# Patient Record
Sex: Male | Born: 1940
Health system: Southern US, Community
[De-identification: ages and names within clinical notes are randomized; demographics above are authoritative.]

## PROBLEM LIST (undated history)

## (undated) DIAGNOSIS — M8580 Other specified disorders of bone density and structure, unspecified site: Secondary | ICD-10-CM

## (undated) DIAGNOSIS — S72009A Fracture of unspecified part of neck of unspecified femur, initial encounter for closed fracture: Secondary | ICD-10-CM

## (undated) DIAGNOSIS — G252 Other specified forms of tremor: Secondary | ICD-10-CM

## (undated) DIAGNOSIS — E291 Testicular hypofunction: Secondary | ICD-10-CM

## (undated) DIAGNOSIS — R079 Chest pain, unspecified: Secondary | ICD-10-CM

## (undated) DIAGNOSIS — K219 Gastro-esophageal reflux disease without esophagitis: Secondary | ICD-10-CM

## (undated) DIAGNOSIS — G250 Essential tremor: Secondary | ICD-10-CM

## (undated) DIAGNOSIS — I10 Essential (primary) hypertension: Secondary | ICD-10-CM

## (undated) DIAGNOSIS — Z8546 Personal history of malignant neoplasm of prostate: Secondary | ICD-10-CM

## (undated) DIAGNOSIS — J309 Allergic rhinitis, unspecified: Secondary | ICD-10-CM

## (undated) DIAGNOSIS — J449 Chronic obstructive pulmonary disease, unspecified: Secondary | ICD-10-CM

## (undated) HISTORY — DX: Testicular hypofunction: E29.1

## (undated) HISTORY — DX: Chest pain, unspecified: R07.9

## (undated) HISTORY — DX: Essential tremor: G25.0

## (undated) HISTORY — DX: Fracture of unspecified part of neck of unspecified femur, initial encounter for closed fracture: S72.009A

## (undated) HISTORY — DX: Other specified disorders of bone density and structure, unspecified site: M85.80

## (undated) HISTORY — DX: Gastro-esophageal reflux disease without esophagitis: K21.9

## (undated) HISTORY — DX: Other specified forms of tremor: G25.2

## (undated) HISTORY — DX: Allergic rhinitis, unspecified: J30.9

## (undated) HISTORY — DX: Personal history of malignant neoplasm of prostate: Z85.46

## (undated) HISTORY — PX: INSERTION PROSTATE RADIATION SEED: SUR718

## (undated) HISTORY — DX: Essential (primary) hypertension: I10

## (undated) HISTORY — DX: Chronic obstructive pulmonary disease, unspecified: J44.9

## (undated) SURGERY — Surgical Case
Anesthesia: *Unknown

---

## 2002-01-19 ENCOUNTER — Ambulatory Visit: Admission: RE | Admit: 2002-01-19 | Discharge: 2002-04-19 | Payer: Self-pay | Admitting: Radiation Oncology

## 2002-11-18 ENCOUNTER — Ambulatory Visit: Admission: RE | Admit: 2002-11-18 | Discharge: 2003-02-16 | Payer: Self-pay | Admitting: Radiation Oncology

## 2003-01-11 ENCOUNTER — Encounter: Admission: RE | Admit: 2003-01-11 | Discharge: 2003-01-11 | Payer: Self-pay | Admitting: Urology

## 2003-01-11 ENCOUNTER — Encounter: Payer: Self-pay | Admitting: Urology

## 2003-01-18 ENCOUNTER — Ambulatory Visit (HOSPITAL_BASED_OUTPATIENT_CLINIC_OR_DEPARTMENT_OTHER): Admission: RE | Admit: 2003-01-18 | Discharge: 2003-01-18 | Payer: Self-pay | Admitting: Urology

## 2003-02-26 ENCOUNTER — Ambulatory Visit: Admission: RE | Admit: 2003-02-26 | Discharge: 2003-03-10 | Payer: Self-pay | Admitting: Radiation Oncology

## 2003-12-19 ENCOUNTER — Ambulatory Visit: Admission: RE | Admit: 2003-12-19 | Discharge: 2003-12-19 | Payer: Self-pay | Admitting: Radiation Oncology

## 2003-12-26 ENCOUNTER — Ambulatory Visit: Admission: RE | Admit: 2003-12-26 | Discharge: 2003-12-26 | Payer: Self-pay | Admitting: Radiation Oncology

## 2005-01-16 ENCOUNTER — Ambulatory Visit: Payer: Self-pay | Admitting: Family Medicine

## 2005-01-28 ENCOUNTER — Encounter: Admission: RE | Admit: 2005-01-28 | Discharge: 2005-01-28 | Payer: Self-pay | Admitting: Family Medicine

## 2005-01-28 ENCOUNTER — Ambulatory Visit: Payer: Self-pay | Admitting: Gastroenterology

## 2005-02-11 ENCOUNTER — Ambulatory Visit: Payer: Self-pay | Admitting: Gastroenterology

## 2005-02-11 ENCOUNTER — Ambulatory Visit (HOSPITAL_COMMUNITY): Admission: RE | Admit: 2005-02-11 | Discharge: 2005-02-11 | Payer: Self-pay | Admitting: Gastroenterology

## 2005-02-28 ENCOUNTER — Ambulatory Visit: Payer: Self-pay | Admitting: Family Medicine

## 2005-03-18 ENCOUNTER — Ambulatory Visit: Payer: Self-pay | Admitting: Family Medicine

## 2005-04-17 ENCOUNTER — Ambulatory Visit: Payer: Self-pay | Admitting: Internal Medicine

## 2005-08-23 ENCOUNTER — Ambulatory Visit: Payer: Self-pay | Admitting: Family Medicine

## 2006-01-20 ENCOUNTER — Ambulatory Visit: Payer: Self-pay | Admitting: Internal Medicine

## 2006-01-23 ENCOUNTER — Ambulatory Visit: Payer: Self-pay | Admitting: Family Medicine

## 2006-02-25 ENCOUNTER — Ambulatory Visit: Payer: Self-pay | Admitting: Internal Medicine

## 2006-04-30 ENCOUNTER — Ambulatory Visit: Payer: Self-pay | Admitting: Internal Medicine

## 2006-09-15 ENCOUNTER — Ambulatory Visit: Payer: Self-pay | Admitting: Internal Medicine

## 2006-11-04 HISTORY — PX: CORONARY ANGIOPLASTY WITH STENT PLACEMENT: SHX49

## 2007-01-22 ENCOUNTER — Ambulatory Visit: Payer: Self-pay | Admitting: Internal Medicine

## 2007-01-22 ENCOUNTER — Encounter: Payer: Self-pay | Admitting: Internal Medicine

## 2007-01-22 DIAGNOSIS — Z8546 Personal history of malignant neoplasm of prostate: Secondary | ICD-10-CM | POA: Insufficient documentation

## 2007-01-22 DIAGNOSIS — K219 Gastro-esophageal reflux disease without esophagitis: Secondary | ICD-10-CM | POA: Insufficient documentation

## 2007-01-22 DIAGNOSIS — R079 Chest pain, unspecified: Secondary | ICD-10-CM | POA: Insufficient documentation

## 2007-01-22 DIAGNOSIS — M899 Disorder of bone, unspecified: Secondary | ICD-10-CM

## 2007-01-22 DIAGNOSIS — M949 Disorder of cartilage, unspecified: Secondary | ICD-10-CM

## 2007-01-22 DIAGNOSIS — I10 Essential (primary) hypertension: Secondary | ICD-10-CM | POA: Insufficient documentation

## 2007-01-22 DIAGNOSIS — S72009A Fracture of unspecified part of neck of unspecified femur, initial encounter for closed fracture: Secondary | ICD-10-CM

## 2007-01-22 DIAGNOSIS — J309 Allergic rhinitis, unspecified: Secondary | ICD-10-CM | POA: Insufficient documentation

## 2007-01-22 HISTORY — DX: Gastro-esophageal reflux disease without esophagitis: K21.9

## 2007-01-22 HISTORY — DX: Allergic rhinitis, unspecified: J30.9

## 2007-01-22 HISTORY — DX: Fracture of unspecified part of neck of unspecified femur, initial encounter for closed fracture: S72.009A

## 2007-01-22 HISTORY — DX: Disorder of bone, unspecified: M89.9

## 2007-01-22 LAB — CONVERTED CEMR LAB
BUN: 17 mg/dL (ref 6–23)
Basophils Absolute: 0.1 10*3/uL (ref 0.0–0.1)
Basophils Relative: 1.6 % — ABNORMAL HIGH (ref 0.0–1.0)
CO2: 30 meq/L (ref 19–32)
Calcium: 9.3 mg/dL (ref 8.4–10.5)
Chloride: 100 meq/L (ref 96–112)
Cholesterol: 214 mg/dL (ref 0–200)
Creatinine, Ser: 0.9 mg/dL (ref 0.4–1.5)
Direct LDL: 117.9 mg/dL
Eosinophils Absolute: 0.3 10*3/uL (ref 0.0–0.6)
Eosinophils Relative: 7.7 % — ABNORMAL HIGH (ref 0.0–5.0)
GFR calc Af Amer: 109 mL/min
GFR calc non Af Amer: 90 mL/min
Glucose, Bld: 87 mg/dL (ref 70–99)
HCT: 41.4 % (ref 39.0–52.0)
HDL: 85.7 mg/dL (ref >39.0)
Hemoglobin: 14.6 g/dL (ref 13.0–17.0)
Lymphocytes Relative: 35.6 % (ref 12.0–46.0)
MCHC: 35.3 g/dL (ref 30.0–36.0)
MCV: 99.7 fL (ref 78.0–100.0)
Monocytes Absolute: 0.3 10*3/uL (ref 0.2–0.7)
Monocytes Relative: 8.2 % (ref 3.0–11.0)
Neutro Abs: 1.5 10*3/uL (ref 1.4–7.7)
Neutrophils Relative %: 46.9 % (ref 43.0–77.0)
Platelets: 191 10*3/uL (ref 150–400)
Potassium: 4.4 meq/L (ref 3.5–5.1)
RBC: 4.15 M/uL — ABNORMAL LOW (ref 4.22–5.81)
RDW: 12.4 % (ref 11.5–14.6)
Sodium: 139 meq/L (ref 135–145)
TSH: 1.2 microintl units/mL (ref 0.35–5.50)
Testosterone: 273.55 ng/dL — ABNORMAL LOW (ref 350.00–890)
Total CHOL/HDL Ratio: 2.5
Triglycerides: 97 mg/dL (ref 0–149)
VLDL: 19 mg/dL (ref 0–40)
WBC: 3.4 10*3/uL — ABNORMAL LOW (ref 4.5–10.5)

## 2007-02-18 ENCOUNTER — Ambulatory Visit: Payer: Self-pay | Admitting: Internal Medicine

## 2007-02-18 ENCOUNTER — Encounter: Payer: Self-pay | Admitting: Internal Medicine

## 2007-02-18 DIAGNOSIS — E291 Testicular hypofunction: Secondary | ICD-10-CM

## 2007-02-18 DIAGNOSIS — G250 Essential tremor: Secondary | ICD-10-CM

## 2007-02-18 DIAGNOSIS — G252 Other specified forms of tremor: Secondary | ICD-10-CM | POA: Insufficient documentation

## 2007-02-18 HISTORY — DX: Testicular hypofunction: E29.1

## 2007-02-18 HISTORY — DX: Essential tremor: G25.0

## 2007-02-20 ENCOUNTER — Ambulatory Visit: Payer: Self-pay | Admitting: Gastroenterology

## 2007-02-20 ENCOUNTER — Ambulatory Visit: Payer: Self-pay

## 2007-02-27 ENCOUNTER — Ambulatory Visit: Payer: Self-pay | Admitting: Gastroenterology

## 2007-02-27 ENCOUNTER — Encounter (INDEPENDENT_AMBULATORY_CARE_PROVIDER_SITE_OTHER): Payer: Self-pay | Admitting: Specialist

## 2007-09-17 ENCOUNTER — Encounter (INDEPENDENT_AMBULATORY_CARE_PROVIDER_SITE_OTHER): Payer: Self-pay | Admitting: *Deleted

## 2007-11-09 ENCOUNTER — Telehealth (INDEPENDENT_AMBULATORY_CARE_PROVIDER_SITE_OTHER): Payer: Self-pay | Admitting: *Deleted

## 2008-02-15 ENCOUNTER — Encounter (INDEPENDENT_AMBULATORY_CARE_PROVIDER_SITE_OTHER): Payer: Self-pay | Admitting: *Deleted

## 2010-09-07 ENCOUNTER — Encounter: Payer: Self-pay | Admitting: Internal Medicine

## 2010-10-18 ENCOUNTER — Ambulatory Visit: Payer: Self-pay | Admitting: Internal Medicine

## 2010-10-18 DIAGNOSIS — J45991 Cough variant asthma: Secondary | ICD-10-CM

## 2010-10-18 HISTORY — DX: Cough variant asthma: J45.991

## 2010-12-04 NOTE — Assessment & Plan Note (Signed)
Summary: yearly checkup   Vital Signs:  Patient Profile:   70 Years Old Male Height:     71 inches Weight:      179 pounds Pulse rate:   60 / minute BP sitting:   150 / 90               History of Present Illness:  here for a yearly checkup. he has noticed  that his blood pressure has been elevated for the last several months to a level ofaround 140 to 150 on the systolic to 90 on the diastolic  ?  Rash in the neck  Some days he has noticed his hands to be shaky.  He has not noticed any relationship of this symptom with anxiety or caffeine intake    Past Medical History:    Hypertension    Risk Factors:  Tobacco use:  quit    Year quit:  1990s Alcohol use:  yes    Comments:   very seldom   Review of Systems       the patient exercises daily. He follows a low-salt low-salt diet  CV      admits to occasional chest pain when he takes his daily walks.  This symptom is not consistent and if he continue to walk the pain goes away.  The pain is also treat her by tomato products intake. denies dyspnea on exertion  GI      Denies bloody stools, diarrhea, nausea, and vomiting.      denies dysphasia and odynophagia  GU      Denies dysuria, incontinence, and urinary frequency.  Psych      Denies anxiety and depression.   Physical Exam  General:     alert, well-developed, and well-nourished.   Neck:     no rash no thyromegaly Lungs:     normal respiratory effort, no intercostal retractions, no accessory muscle use, and normal breath sounds.   Heart:     normal rate, regular rhythm, and no murmur.   Abdomen:     soft, non-tender, normal bowel sounds, no distention, no masses, no guarding, no rigidity, no hepatomegaly, and no splenomegaly.   Pulses:     normal carotid pulse bilaterally Psych:     Oriented X3, normally interactive, good eye contact, not anxious appearing, and not depressed appearing.      Impression & Recommendations:  Problem # 1:   Preventive Health Care (ICD-V70.0) chart reviewed willschedule to have a colonoscopy medication list reviewed check a fasting lipid profile  Problem # 2:  HYPERTENSION (ICD-401.9) increase Monopril to 40 mg continue HCTZ  Problem # 3:  ALLERGIC RHINITIS (ICD-477.9) symptoms are currently well-controlled on Nasonex and Claritin p.r.n.  however he also uses Sudafed daily strongly advised to discontinue the Sudafed   Problem # 4:  OSTEOPENIA (ICD-733.90) due for a bone density test in few months continue calcium and vitamin D check a testosterone level vitamin D Level and a TSH consider further workup  Problem # 5:  GERD (ICD-530.81) symptoms are well controlled continue daily Prilosec chest pain could be related to GERD  Problem # 6:  CHEST PAIN (ICD-786.50) the patient has risk factors such as hypertension and former tobacco use. Pain is atypical. EKG is at baseline I recommend a stress test.  Problem # 7:  tremors on exam today I noticed no tremor asked patient to notice relationship between tremors and caffeine intake reassessing few months  Problem # 8:  follow up  3 months

## 2010-12-04 NOTE — Letter (Signed)
Summary: Primary Care Appointment Letter  [facility] [place]  [address] [place], [place] [address]   Phone: [phone]  Fax: [phone]    02/15/2008 MRN: [id number]  Ichael Lundquist [address] [place], [place]  [address]  Dear Mr. Dyk,   Your Primary Care Physician  has indicated that:    ___X____it is time to schedule an appointment.    _______you missed your appointment on______ and need to call and          reschedule.    _______you need to have lab work done.    _______you need to schedule an appointment discuss lab or test results.    _______you need to call to reschedule your appointment that is                       scheduled on _________.     Please call our office as soon as possible. Our phone number is [phone]          _________. Please press option 1. Our office is open 8a-12noon and 1p-5p, Monday through Friday.     Thank you,     Primary Care Scheduler

## 2010-12-04 NOTE — Assessment & Plan Note (Signed)
   Vital Signs:  Patient Profile:   70 Years Old Male Height:     71 inches Weight:      185.8 pounds Pulse rate:   76 / minute BP sitting:   144 / 96  (left arm)  Vitals Entered By: Doristine Devoid (February 18, 2007 8:22 AM)               Chief Complaint:  high bp and shakey x6 months.  History of Present Illness: the patient in here today because he noticed his blood pressure to be slightly elevated despite the fact that we increase his Monopril recently. Blood pressure ranged from 140 to 160 systolic. He also has noticed some tremor in his hands and definitely like something done about it.     Family History:    father had Parkinson's    Review of Systems       he reports that he is hit shakes but very little.  CV      Cardiolite order during the last visit is pending  Endo      was started on testosterone lately and is tolerating it well   Physical Exam  General:     alert, well-developed, and well-nourished.   Lungs:     clear Heart:     normal rate, regular rhythm, and no murmur.   Neurologic:     fine tremor is not these adhesive bands. Head show no tremor.    Impression & Recommendations:  Problem # 1:  TREMOR, ESSENTIAL (ICD-333.1) most likely the patient has essential tremor spirits Will start beta blockers as a first step of treatment..  Problem # 2:  HYPERTENSION (ICD-401.9) start propranolol 40 mg b.i.d.Marland Kitchen patient instructed to decrease the dose to half if he become  orthostatic or lightheaded  Problem # 3:  HYPOGONADISM (ICD-257.2) he just is started on hormone replacement. Denies any side effects.  Denies any difficulty urinating or increase on his tremo will recheck labels in 4 weeks.  Problem # 4:  follow-up in 4 weeks, Cardiolite pending   Patient Instructions: 1)  started a new medication called propranolol twice a day. this medicine will help both the shakiness and  blood pressure 2)  If you feel  weak, dizzy when you stand  up, cut  the medication in half or stop it altogether and let me know. 3)  Come back in 4 weeks

## 2010-12-04 NOTE — Letter (Signed)
Summary: Generic Letter  [facility] [place]  [address] [place], [place] [address]   Phone: [phone]  Fax: [phone]    09/17/2007  Brian Dunn [address] [place], [place]  [address]    Dear [name],  You are due for an office visit with Dr. Drue Novel.  Please call our office @ [phone] to schedule an appointment.      Sincerely,   Gypsy Decant at [facility]

## 2010-12-04 NOTE — Progress Notes (Signed)
Summary: paz-flonase 0.05%  Medications Added FLONASE 50 MCG/ACT  SUSP (FLUTICASONE PROPIONATE) 2 SPRAYS EACH NOSTIL QD       Phone Note Refill Request   Refills Requested: Medication #1:  flonase 0.5% spr [facility] [address] [id number]   Initial call taken by: Freddy Jaksch,  November 09, 2007 9:50 AM    New/Updated Medications: FLONASE 50 MCG/ACT  SUSP (FLUTICASONE PROPIONATE) 2 SPRAYS EACH NOSTIL QD   Prescriptions: FLONASE 50 MCG/ACT  SUSP (FLUTICASONE PROPIONATE) 2 SPRAYS EACH NOSTIL QD  #1 x 1   Entered by:   Shary Decamp   Authorized by:   Nolon Rod. Paz MD   Signed by:   Shary Decamp on 11/09/2007   Method used:   Electronically sent to ...       [facility] [address]       [address]       [place], [place]  [address]       Ph: [phone] or [phone]       Fax: [phone]   RxID:   [id number]

## 2010-12-06 NOTE — Assessment & Plan Note (Signed)
Summary: Pulmonary/ new pt eval > try off ace, coreg then pft's      Visit Type:  Initial Consult Copy to:  Dr. Gwendlyn Deutscher Primary Provider/Referring Provider:  Dr. Gwendlyn Deutscher  CC:  Recurrent PNA.  History of Present Illness: 70 yowm quit smokjng in 2000 with no respiratory problems at all   October 18, 2010  1st pulmonary office eval for recurrent "pna" = rattling cough/ fatigue/ nasty mucus and difficulty breathing to point of needing recliner requiring prolonged abx improved but not completely better from July thru  November but on day of ov compltely better x for fatigue and cough when eat and still requires  mucinex assoc with intermittent hb and nasal congestion but no purulent secretions.  Pt denies any significant sore throat, dysphagia, itching, sneezing,   fever, chills, sweats, unintended wt loss, pleuritic or exertional cp, hempoptysis, change in activity tolerance  orthopnea pnd or leg swelling Pt also denies any obvious fluctuation in symptoms with weather or environmental change or other alleviating or aggravating factors.       Current Medications (verified): 1)  Hydrochlorothiazide 25 Mg  Tabs (Hydrochlorothiazide) .... Take One Tablet Daily 2)  Osteo Bi-Flex Adv Joint Shield  Tabs (Misc Natural Products) .Marland Kitchen.. 1 Once Daily 3)  Coenzyme Q10 10 Mg Caps (Coenzyme Q10) .Marland Kitchen.. 1 Once Daily 4)  Potassium 99 Mg Tabs (Potassium) .Marland Kitchen.. 1 Once Daily 5)  Saw Palmetto 450 Mg Caps (Saw Palmetto (Serenoa Repens)) .Marland Kitchen.. 1 Once Daily 6)  Omeprazole 20 Mg Cpdr (Omeprazole) .Marland Kitchen.. 1 Once Daily As Needed 7)  Fish Oil Double Strength 1200 Mg Caps (Omega-3 Fatty Acids) .Marland Kitchen.. 1 Once Daily 8)  Multivitamins  Tabs (Multiple Vitamin) .Marland Kitchen.. 1 Once Daily 9)  Vitamin B-12 500 Mcg Tabs (Cyanocobalamin) .Marland Kitchen.. 1 Once Daily 10)  Aspirin 81 Mg Tbec (Aspirin) .Marland Kitchen.. 1 Once Daily 11)  Tamsulosin Hcl 0.4 Mg Caps (Tamsulosin Hcl) .Marland Kitchen.. 1 Once Daily 12)  Carvedilol 25 Mg Tabs (Carvedilol) .Marland Kitchen.. 1 Once Daily 13)   Fosinopril Sodium 40 Mg Tabs (Fosinopril Sodium) .Marland Kitchen.. 1 Once Daily 14)  Amlodipine Besylate 5 Mg Tabs (Amlodipine Besylate) .Marland Kitchen.. 1 Once Daily 15)  Mucinex Dm 30-600 Mg Xr12h-Tab (Dextromethorphan-Guaifenesin) .Marland Kitchen.. 1 Once Daily 16)  Flovent Hfa 110 Mcg/act Aero (Fluticasone Propionate  Hfa) .Marland Kitchen.. 1 Puff Two Times A Day  Allergies (verified): 1)  ! Pcn 2)  ! Morphine  Past History:  Past Medical History: COPD (ICD-496) HYPOGONADISM (ICD-257.2) TREMOR, ESSENTIAL (ICD-333.1) CHEST PAIN (ICD-786.50) HIP FRACTURE (ICD-820.8) OSTEOPENIA (ICD-733.90) ALLERGIC RHINITIS (ICD-477.9) GERD (ICD-530.81) HX, PERSONAL, MALIGNANCY, PROSTATE (ICD-V10.46) HYPERTENSION (ICD-401.9)      - Try off ace and coreg October 18, 2010   Past Surgical History: Cardiac Stent- 2008 Prostate seed implant 2003  Family History: Parkinson's dz- Father Throat CA- Sister Asthma- Father and Sister Heart dz- Mother and Sister  Social History: Married Children Former smoker.  Quit in 2000.  Smoked for approx 35 yrs- up to 1 ppd. ETOH daily Works in [employer]  Review of Systems       The patient complains of shortness of breath with activity, non-productive cough, acid heartburn, loss of appetite, weight change, nasal congestion/difficulty breathing through nose, and joint stiffness or pain.  The patient denies shortness of breath at rest, productive cough, coughing up blood, chest pain, irregular heartbeats, indigestion, abdominal pain, difficulty swallowing, sore throat, tooth/dental problems, headaches, sneezing, itching, ear ache, anxiety, depression, hand/feet swelling, rash, change in color of mucus, and fever.  Vital Signs:  Patient profile:   70 year old male Weight:      173 pounds BMI:     24.22 O2 Sat:      99 % on Room air Temp:     97.5 degrees F oral Pulse rate:   54 / minute BP sitting:   114 / 60  (left arm)  Vitals Entered By: Vernie Murders (October 18, 2010 11:04 AM)  O2 Flow:  Room  air  Physical Exam  Additional Exam:  amb slt hoarse wm nad wt 173 October 18, 2010 HEENT mild turbinate edema.  Oropharynx no thrush or excess pnd or cobblestoning.  No JVD or cervical adenopathy. Mild accessory muscle hypertrophy. Trachea midline, nl thryroid. Chest was hyperinflated by percussion with diminished breath sounds and moderate increased exp time without wheeze. Hoover sign positive at mid inspiration. Regular rate and rhythm without murmur gallop or rub or increase P2 or edema.  Abd: no hsm, nl excursion. Ext warm without cyanosis or clubbing.     CXR  Procedure date:  09/07/2010  Findings:      Right infrahilar atx seen best on lateral view  Impression & Recommendations:  Problem # 1:  COPD (ICD-496) When respiratory symptoms begin well after a patient reports complete smoking cessation,  it is very hard to "blame" COPD  ie it doesn't make any more sense than hearing a  NASCAR driver wrecked his car while driving his kids to school or a Careers adviser sliced his hand off carving [place].  Once the high risk activity stops,  the symptoms should not suddenly erupt.  If so, the differential diagnosis should include  obesity/deconditioning,  LPR/Reflux, CHF, or side effect of medications, esp coreg and ace inhibitors  Will need full pfts on return off ace and coreg x 1 month  Problem # 2:  HYPERTENSION (ICD-401.9)  The following medications were removed from the medication list:    Metoprolol Tartrate 25 Mg Tabs (Metoprolol tartrate) .Marland Kitchen... 1 by mouth two times a day-due office visit    Carvedilol 25 Mg Tabs (Carvedilol) .Marland Kitchen... 1 once daily    Fosinopril Sodium 40 Mg Tabs (Fosinopril sodium) .Marland Kitchen... 1 once daily    Bystolic 20 Mg Tabs (Nebivolol hcl) ..... One daily His updated medication list for this problem includes:    Hydrochlorothiazide 25 Mg Tabs (Hydrochlorothiazide) .Marland Kitchen... Take one tablet daily    Amlodipine Besylate 5 Mg Tabs (Amlodipine besylate) .Marland Kitchen... 1 once daily     Benicar 40 Mg Tabs (Olmesartan medoxomil) ..... One tablet by mouth daily    Bisoprolol Fumarate 10 Mg Tabs (Bisoprolol fumarate) ..... One daily  Orders: New Patient Level V ([address])  Medications Added to Medication List This Visit: 1)  Osteo Bi-flex Adv Joint Shield Tabs (Misc natural products) .Marland Kitchen.. 1 once daily 2)  Coenzyme Q10 10 Mg Caps (Coenzyme q10) .Marland Kitchen.. 1 once daily 3)  Potassium 99 Mg Tabs (Potassium) .Marland Kitchen.. 1 once daily 4)  Saw Palmetto 450 Mg Caps (Saw palmetto (serenoa repens)) .Marland Kitchen.. 1 once daily 5)  Multivitamins Tabs (Multiple vitamin) .Marland Kitchen.. 1 once daily 6)  Fish Oil Double Strength 1200 Mg Caps (Omega-3 fatty acids) .Marland Kitchen.. 1 once daily 7)  Vitamin B-12 500 Mcg Tabs (Cyanocobalamin) .Marland Kitchen.. 1 once daily 8)  Aspirin 81 Mg Tbec (Aspirin) .Marland Kitchen.. 1 once daily 9)  Tamsulosin Hcl 0.4 Mg Caps (Tamsulosin hcl) .Marland Kitchen.. 1 once daily 10)  Carvedilol 25 Mg Tabs (Carvedilol) .Marland Kitchen.. 1 once daily 11)  Fosinopril Sodium 40 Mg Tabs (  Fosinopril sodium) .Marland Kitchen.. 1 once daily 12)  Amlodipine Besylate 5 Mg Tabs (Amlodipine besylate) .Marland Kitchen.. 1 once daily 13)  Mucinex Dm 30-600 Mg Xr12h-tab (Dextromethorphan-guaifenesin) .Marland Kitchen.. 1 once daily 14)  Flovent Hfa 110 Mcg/act Aero (Fluticasone propionate  hfa) .Marland Kitchen.. 1 puff two times a day 15)  Omeprazole 20 Mg Cpdr (Omeprazole) .Marland Kitchen.. 1 once daily as needed 16)  Benicar 40 Mg Tabs (Olmesartan medoxomil) .... One tablet by mouth daily 17)  Bystolic 20 Mg Tabs (Nebivolol hcl) .... One daily 18)  Bisoprolol Fumarate 10 Mg Tabs (Bisoprolol fumarate) .... One daily  Patient Instructions: 1)  Stop flovent/ crevidol/ fish oil/ finsopril  2)  Benicar 40 mg one daily 3)  bystolic 20 mg one daily  4)  Please schedule a follow-up appointment in 6 weeks, sooner if needed with pft's and cxr  Prescriptions: BISOPROLOL FUMARATE 10 MG TABS (BISOPROLOL FUMARATE) one daily  #30 x 11   Entered by:   Vernie Murders   Authorized by:   Nyoka Cowden MD   Signed by:   Vernie Murders on 10/18/2010    Method used:   Electronically to        [facility]  [id number] (retail)       [address]       [place], [place]  [address]       Ph: [phone] or [phone]       Fax: [phone]   RxID:   [id number] BISOPROLOL FUMARATE 10 MG TABS (BISOPROLOL FUMARATE) one daily  #30 x 11   Entered and Authorized by:   Nyoka Cowden MD   Signed by:   Nyoka Cowden MD on 10/18/2010   Method used:   Electronically to        [facility] [address] (retail)       [address]       [place], [place]  [address]       Ph: [phone] or [phone]       Fax: [phone]   RxID:   [id number]

## 2010-12-07 ENCOUNTER — Ambulatory Visit: Admit: 2010-12-07 | Payer: Self-pay | Admitting: Internal Medicine

## 2010-12-07 ENCOUNTER — Other Ambulatory Visit: Payer: Self-pay | Admitting: Internal Medicine

## 2010-12-07 ENCOUNTER — Ambulatory Visit (INDEPENDENT_AMBULATORY_CARE_PROVIDER_SITE_OTHER): Payer: Medicare Other | Admitting: Internal Medicine

## 2010-12-07 ENCOUNTER — Encounter: Payer: Self-pay | Admitting: Internal Medicine

## 2010-12-07 ENCOUNTER — Ambulatory Visit (INDEPENDENT_AMBULATORY_CARE_PROVIDER_SITE_OTHER)
Admission: RE | Admit: 2010-12-07 | Discharge: 2010-12-07 | Disposition: A | Discharge status: Home / Self Care | Payer: Medicare Other | Source: Ambulatory Visit | Attending: Internal Medicine | Admitting: Internal Medicine

## 2010-12-07 DIAGNOSIS — J449 Chronic obstructive pulmonary disease, unspecified: Secondary | ICD-10-CM

## 2010-12-07 DIAGNOSIS — I10 Essential (primary) hypertension: Secondary | ICD-10-CM

## 2010-12-10 ENCOUNTER — Telehealth (INDEPENDENT_AMBULATORY_CARE_PROVIDER_SITE_OTHER): Payer: Self-pay | Admitting: *Deleted

## 2010-12-20 NOTE — Assessment & Plan Note (Signed)
Summary: Pulmonary/ ext f/u ov for uacs   Vital Signs:  Patient profile:   70 year old male Height:      71 inches Weight:      183.38 pounds BMI:     25.67 O2 Sat:      97 % on Room air Temp:     97.7 degrees F oral Pulse rate:   62 / minute BP sitting:   132 / 74  (left arm) Cuff size:   large  Vitals Entered By: Carver Fila (December 07, 2010 9:35 AM)  O2 Flow:  Room air CC: breathing is better. Pt c/o wheezing, little cough Comments meds and allergies updated Phone number updated Carver Fila  December 07, 2010 9:36 AM    Copy to:  Dr. Gwendlyn Deutscher Primary Provider/Referring Provider:  Dr. Gwendlyn Deutscher  CC:  breathing is better. Pt c/o wheezing and little cough.  History of Present Illness: 31 yowm quit smokjng in 2000 with no respiratory problems at all   October 18, 2010  1st pulmonary office eval for recurrent "pna" = rattling cough/ fatigue/ nasty mucus and difficulty breathing to point of needing recliner requiring prolonged abx improved but not completely better from July thru  November 2011  but on day of ov compltely better x for fatigue and cough when eat and still requires  mucinex assoc with intermittent hb and nasal congestion but no purulent secretions.  rec Stop flovent/ crevidol/ fish oil/ finsopril  Benicar 40 mg one daily bystolic 20 mg one daily   December 07, 2010 ov breathing is better. Pt c/o wheezing, little cough x one week thinks caught a cold but this happened after resumed fisopril because ran out of benicar.  no purulent sputum.  Pt denies any significant sore throat, dysphagia, itching, sneezing,  nasal congestion or excess secretions,  fever, chills, sweats, unintended wt loss, pleuritic or exertional cp, hempoptysis, change in activity tolerance  orthopnea pnd or leg swelling        Current Medications (verified): 1)  Hydrochlorothiazide 25 Mg  Tabs (Hydrochlorothiazide) .... Take One Tablet Daily 2)  Osteo Bi-Flex Adv Joint Shield  Tabs  (Misc Natural Products) .Marland Kitchen.. 1 Once Daily 3)  Coenzyme Q10 10 Mg Caps (Coenzyme Q10) .Marland Kitchen.. 1 Once Daily 4)  Potassium 99 Mg Tabs (Potassium) .Marland Kitchen.. 1 Once Daily 5)  Saw Palmetto 450 Mg Caps (Saw Palmetto (Serenoa Repens)) .Marland Kitchen.. 1 Once Daily 6)  Multivitamins  Tabs (Multiple Vitamin) .Marland Kitchen.. 1 Once Daily 7)  Vitamin B-12 500 Mcg Tabs (Cyanocobalamin) .Marland Kitchen.. 1 Once Daily 8)  Aspirin 81 Mg Tbec (Aspirin) .Marland Kitchen.. 1 Once Daily 9)  Tamsulosin Hcl 0.4 Mg Caps (Tamsulosin Hcl) .Marland Kitchen.. 1 Once Daily 10)  Amlodipine Besylate 5 Mg Tabs (Amlodipine Besylate) .Marland Kitchen.. 1 Once Daily 11)  Mucinex Dm 30-600 Mg Xr12h-Tab (Dextromethorphan-Guaifenesin) .Marland Kitchen.. 1 Once Daily 12)  Omeprazole 20 Mg Cpdr (Omeprazole) .Marland Kitchen.. 1 Once Daily As Needed 13)  Benicar 40 Mg  Tabs (Olmesartan Medoxomil) .... One Tablet By Mouth Daily 14)  Bisoprolol Fumarate 10 Mg Tabs (Bisoprolol Fumarate) .... One Daily  Allergies (verified): 1)  ! Pcn 2)  ! Morphine  Past History:  Past Medical History: COPD (ICD-496) HYPOGONADISM (ICD-257.2) TREMOR, ESSENTIAL (ICD-333.1) CHEST PAIN (ICD-786.50) HIP FRACTURE (ICD-820.8) OSTEOPENIA (ICD-733.90) ALLERGIC RHINITIS (ICD-477.9) GERD (ICD-530.81) HX, PERSONAL, MALIGNANCY, PROSTATE (ICD-V10.46) HYPERTENSION (ICD-401.9)      - Try off ace and coreg October 18, 2010 > much better under resumed ace > try off again  December 07, 2010   Physical Exam  Additional Exam:  amb slt hoarse wm nad/ slt nasal tone to voice wt 173 October 18, 2010  > 183 December 07, 2010  HEENT mild turbinate edema.  Oropharynx no thrush or excess pnd or cobblestoning.  No JVD or cervical adenopathy. Mild accessory muscle hypertrophy. Trachea midline, nl thryroid. Chest was hyperinflated by percussion with diminished breath sounds and moderate increased exp time without wheeze. Hoover sign positive at mid inspiration. Regular rate and rhythm without murmur gallop or rub or increase P2 or edema.  Abd: no hsm, nl excursion. Ext warm  without cyanosis or clubbing.     Impression & Recommendations:  Problem # 1:  COPD (ICD-496) When respiratory symptoms begin well after a patient reports complete smoking cessation,  it is very hard to "blame" COPD  ie it doesn't make any more sense than hearing a  NASC[employer] driver wrecked his car while driving his kids to school or a Careers adviser sliced his hand off carving [place].  Once the high risk activity stops,  the symptoms should not suddenly erupt.  If so, the differential diagnosis should include  obesity/deconditioning,  LPR/Reflux, CHF, or side effect of medications, esp coreg and ace inhibitors  Will need full pfts on return off ace and coreg x 1 month > was clearly better off ace so will rechallenge off ace again Also needs rx for omeprazole for any flare in resp symptoms, not just classic HB  Problem # 2:  HYPERTENSION (ICD-401.9)  His updated medication list for this problem includes:    Hydrochlorothiazide 25 Mg Tabs (Hydrochlorothiazide) .Marland Kitchen... Take one tablet daily    Amlodipine Besylate 5 Mg Tabs (Amlodipine besylate) .Marland Kitchen... 1 once daily    Benicar 40 Mg Tabs (Olmesartan medoxomil) ..... One tablet by mouth daily    Bisoprolol Fumarate 10 Mg Tabs (Bisoprolol fumarate) ..... One daily   ACE inhibitors are problematic in  pts with airway complaints because  even experienced pulmonologists can't always distinguish ace effects from copd/asthma.  By themselves they don't actually cause a problem, much like oxygen can't by itself start a fire, but they certainly serve as a powerful catalyst or enhancer for any "fire"  or inflammatory process in the upper airway, be it caused by an ET  tube or more commonly reflux (especially in the obese or pts with known GERD or who are on biphoshonates).  In the era of ARB near equivalency until we have a better handle on the reversibility of the airway problem, it just makes sense to avoid ace entirely in the short run and then decide later, having  established a level of airway control using a reasonable limited regimen, whether to add back ace but even then being very careful to observe the pt for worsening airway control and number of meds used/ needed to control symptoms.     Easily confused with med instructions     Other Orders: T-2 View CXR (71020TC) Est. Patient Level IV (29528)  Patient Instructions: 1)  Please schedule a follow-up appointment in 4 weeks, sooner if needed with PFT's on return 2)  Omeprazole 20 mg Take  one 30-60 min before first meal of the day as needed for heartburn but also for any respiratory symptom flare (cough, congestion, short of breath) 3)  Stop flovent/ crevidol/ fish oil/ finsopril  4)  Benicar 40 mg one daily 5)  Bisoprolol 10 mg one daily   Orders Added: 1)  T-2 View  CXR [71020TC] 2)  Est. Patient Level IV [[address]]   Immunization History:  Influenza Immunization History:    Influenza:  historical (09/09/2010)   Immunization History:  Influenza Immunization History:    Influenza:  Historical (09/09/2010)    CXR  Procedure date:  12/07/2010  Findings:      Comparison: [facility] [address] 01/28/2005.   Findings: Stable slight chronic wedging inferior dorsal spine without retropulsion visualized.  Lungs remain clear.  Heart size normal.  Mediastinum, hila, pleura and remaining osseous structures are stable and unremarkable.   IMPRESSION: Stable - no active disease.

## 2010-12-20 NOTE — Progress Notes (Signed)
Summary: Rx for omeprazole ok to call in  Phone Note Call from Patient Call back at Home Phone [phone]   Caller: Patient Call For: wert Summary of Call: Returning [name] call. Initial call taken by: Darletta Moll,  December 10, 2010 8:31 AM  Follow-up for Phone Call        called and gave pt cxr results and he verblized understanidng. Pt states he would like an rx for omeprazole bc it would be much cheaper for him. MW, please advise if you are okay if we send rx Brian Dunn  December 10, 2010 8:42 AM  ok omeprazole 20 mg Take  one 30-60 min before first meal of the day  Follow-up by: Nyoka Cowden MD,  December 10, 2010 11:19 AM  Additional Follow-up for Phone Call Additional follow up Details #1::        Rx was sent and pt made aware.   Additional Follow-up by: Vernie Murders,  December 10, 2010 11:27 AM    New/Updated Medications: OMEPRAZOLE 20 MG CPDR (OMEPRAZOLE) take 1 by mouth 30-60 min before first meal of the day Prescriptions: OMEPRAZOLE 20 MG CPDR (OMEPRAZOLE) take 1 by mouth 30-60 min before first meal of the day  #30 x 5   Entered by:   Vernie Murders   Authorized by:   Nyoka Cowden MD   Signed by:   Vernie Murders on 12/10/2010   Method used:   [facility]  [address] [id number]       [address]       [place], [place]  [address]       Ph: [phone] or [phone]       Fax: [phone]   RxID:   [id number]

## 2010-12-25 ENCOUNTER — Telehealth (INDEPENDENT_AMBULATORY_CARE_PROVIDER_SITE_OTHER): Payer: Self-pay | Admitting: *Deleted

## 2011-01-01 NOTE — Progress Notes (Signed)
Summary: Benicar not covered by insurance> change to diovan 320<<<done  Phone Note Outgoing Call   Call placed by: Michel Bickers CMA,  December 25, 2010 10:36 AM Call placed to: [facility] [phone] Summary of Call: PA initiated for Benicar.  Member ID# is [id number].  Covered alternatives are:  Losartan, Micardis and Diovan.  Pls advise if one of the listed alternatives would be appropriate for this patient.  If PA is still needed we will need to call [facility] at [phone]. Initial call taken by: Michel Bickers CMA,  December 25, 2010 10:39 AM  Follow-up for Phone Call        diovan 320 mg one daily will do Follow-up by: Nyoka Cowden MD,  December 25, 2010 4:05 PM  Additional Follow-up for Phone Call Additional follow up Details #1::        Pt is aware and [facility] [address] [facility] [place] per pt request.    New/Updated Medications: DIOVAN 320 MG TABS (VALSARTAN) 1 by mouth daily Prescriptions: DIOVAN 320 MG TABS (VALSARTAN) 1 by mouth daily  #30 x 6   Entered by:   Michel Bickers CMA   Authorized by:   Nyoka Cowden MD   Signed by:   Michel Bickers CMA on 12/26/2010   Method used:   [facility]        [facility]  [address] [id number]       [address]       [place], [place]  [address]       Ph: [phone] or [phone]       Fax: [phone]   RxID:   [id number]

## 2011-01-09 ENCOUNTER — Telehealth (INDEPENDENT_AMBULATORY_CARE_PROVIDER_SITE_OTHER): Payer: Self-pay | Admitting: *Deleted

## 2011-01-10 ENCOUNTER — Ambulatory Visit: Payer: Medicare Other | Admitting: Internal Medicine

## 2011-01-15 NOTE — Progress Notes (Signed)
Summary: Diovan too expensive > try hyzaar 100/25  Phone Note Call from Patient   Caller: Patient Call For: Brian Dunn Summary of Call: pt says he needs a rx that's generic to replace diovan- says this is too expensive- 40.00- so he never picked this up. call celln [phone] Initial call taken by: Tivis Ringer, CNA,  January 09, 2011 10:48 AM  Follow-up for Phone Call        Pt was previously put on Benicar 40mg  but per previous phone note this was changed to diovan 320 for insurance purposes.  Pt states he went to pick up Diovan rx but it was $40.  States this is too expensive and would like to know if there is a cheaper alternative.  He has 8 days of bp med left.  Ok with call back tomorrow when MW returns to office.   Follow-up by: Gweneth Dimitri RN,  January 09, 2011 11:01 AM  Additional Follow-up for Phone Call Additional follow up Details #1::        try hyzaar 100 but will need bp rechecked on this in one week after makes change and may need to be adjusted by [place] or his primary Additional Follow-up by: Nyoka Cowden MD,  January 09, 2011 11:33 AM    Additional Follow-up for Phone Call Additional follow up Details #2::    Dr. Sherene Sires, do you want hyzaar 100/12.5 or 100/25?   Gweneth Dimitri RN  January 09, 2011 11:40 AM make it 100/25 but he'll need to stop the hctz 25 presently taking per mar Follow-up by: Nyoka Cowden MD,  January 09, 2011 2:35 PM  Additional Follow-up for Phone Call Additional follow up Details #3:: Details for Additional Follow-up Action Taken: Rx was sent-need to inform him to stop HCTZ that he is already taking. Vernie Murders  January 09, 2011 2:40 PM  Pt returned call.  He was informed to take hyzaar 100/25 but he will need to stop the HCTZ he is already taking as this is included in the hyzaar.  He verbalized understanding of this.  Pt aware he will need to have bp rechecked in 1 wk after starting new med by either [place] or PCP.  He verbalized understanding and stated he would  like to have this checked at PCP office as it is closer to him.  Advised this is ok but if they can't do it to call our office back.  Pt verbalized understanding of all instructions. Additional Follow-up by: Gweneth Dimitri RN,  January 09, 2011 4:45 PM  New/Updated Medications: HYDROCHLOROTHIAZIDE 25 MG  TABS (HYDROCHLOROTHIAZIDE) take one tablet daily ** PT WILL STOP ONCE HE STARTS HYZAAR HYZAAR 100-25 MG TABS (LOSARTAN POTASSIUM-HCTZ) Take 1 tablet by mouth once a day Prescriptions: HYZAAR 100-25 MG TABS (LOSARTAN POTASSIUM-HCTZ) Take 1 tablet by mouth once a day  #30 x 0   Entered by:   Gweneth Dimitri RN   Authorized by:   Nyoka Cowden MD   Signed by:   Gweneth Dimitri RN on 01/09/2011   Method used:   [facility]        [facility]  [id number]       [address]       [place], [place]  [address]       Ph: [phone] or [phone]       Fax: [phone]   RxID:   [id number]

## 2011-01-17 ENCOUNTER — Telehealth: Payer: Self-pay | Admitting: Internal Medicine

## 2011-01-22 ENCOUNTER — Encounter: Payer: Self-pay | Admitting: Internal Medicine

## 2011-01-22 ENCOUNTER — Other Ambulatory Visit: Payer: Self-pay | Admitting: Internal Medicine

## 2011-01-22 ENCOUNTER — Ambulatory Visit (INDEPENDENT_AMBULATORY_CARE_PROVIDER_SITE_OTHER): Payer: Medicare Other | Admitting: Internal Medicine

## 2011-01-22 VITALS — BP 128/80 | HR 49 | Temp 97.9°F | Wt 178.0 lb

## 2011-01-22 DIAGNOSIS — I10 Essential (primary) hypertension: Secondary | ICD-10-CM

## 2011-01-22 DIAGNOSIS — G252 Other specified forms of tremor: Secondary | ICD-10-CM

## 2011-01-22 DIAGNOSIS — G250 Essential tremor: Secondary | ICD-10-CM

## 2011-01-22 DIAGNOSIS — J449 Chronic obstructive pulmonary disease, unspecified: Secondary | ICD-10-CM

## 2011-01-22 DIAGNOSIS — K219 Gastro-esophageal reflux disease without esophagitis: Secondary | ICD-10-CM

## 2011-01-22 MED ORDER — MOMETASONE FURO-FORMOTEROL FUM 200-5 MCG/ACT IN AERO: 2.0000 | INHALATION_SPRAY | Freq: Two times a day (BID) | RESPIRATORY_TRACT | Status: DC

## 2011-01-22 NOTE — Assessment & Plan Note (Signed)
He has more AB than previously appreciated and is an excellent candidate for symbicort or dulera pending results of pft's  The proper method of use, as well as anticipated side effects, of this metered-dose inhaler are discussed and demonstrated to the patient.     Each maintenance medication was reviewed in detail including most importantly the difference between maintenance and as needed and under what circumstances the prns are to be used.  Please see instructions for details which were reviewed in writing and the patient given a copy.

## 2011-01-22 NOTE — Assessment & Plan Note (Signed)
I think of GERD in setting of unstable copd like I think of oxygen in a fire and therefore rec aggressive diet modification and ppi until the fire is completely out, then regroup.

## 2011-01-22 NOTE — Progress Notes (Signed)
  Subjective:    Patient ID: Brian Dunn, male    DOB: Apr 27, 1941, 70 y.o.   MRN: [id number]  HPI  63 yowm quit smokjng in 2000 with no respiratory problems at all   October 18, 2010  1st pulmonary office eval for recurrent "pna" x 2 years  = rattling cough/ fatigue/ nasty mucus and difficulty breathing to point of needing recliner requiring prolonged abx improved but not completely better from July thru  November 2011  but on day of ov completely better x for fatigue and cough when eat and still requires mucinex assoc with intermittent hb and nasal congestion but no purulent secretions.  rec Stop flovent/ crevidol/ fish oil/ finsopril  Benicar 40 mg one daily bystolic 20 mg one daily   December 07, 2010 ov breathing is better. Pt c/o wheezing, little cough x one week thinks caught a cold but this happened after resumed fisopril because ran out of benicar and changed back to ac   rec stay on ppi and off flovent ,  corvedol and fish and never got better cough then much worse 1st week in March more congestion with yellow mucus steadily worse esp when head hits pillow and after meals.  01/22/2011  Ov much worse cough despite off ace, on bystolic and on ppi.  Esp severe at hs. No purulent sputum or overt hb.     Pt denies any significant sore throat, dysphagia, itching, sneezing,  nasal congestion or excess/ purulent secretions,  fever, chills, sweats, unintended wt loss, pleuritic or exertional cp, hempoptysis, orthopnea pnd or leg swelling.    Also denies any obvious fluctuation of symptoms with weather or environmental changes or other aggravating or alleviating factors.  Past Medical History: COPD (ICD-496)     - HFA 75% p coaching 01/22/2011  HYPOGONADISM (ICD-257.2) TREMOR, ESSENTIAL (ICD-333.1) CHEST PAIN (ICD-786.50) HIP FRACTURE (ICD-820.8) OSTEOPENIA (ICD-733.90) ALLERGIC RHINITIS (ICD-477.9) GERD (ICD-530.81) HX, PERSONAL, MALIGNANCY, PROSTATE (ICD-V10.46) HYPERTENSION  (ICD-401.9)      - Try off ace and coreg October 18, 2010 > much better under resumed ace > try off again December 07, 2010          Review of Systems     Objective:   Physical Exam     amb slt hoarse wm nad/ slt nasal tone to voice wt 173 October 18, 2010  > 183 December 07, 2010  HEENT mild turbinate edema.  Oropharynx no thrush or excess pnd or cobblestoning.  No JVD or cervical adenopathy. Mild accessory muscle hypertrophy. Trachea midline, nl thryroid. Chest was hyperinflated by percussion with diminished breath sounds and moderate increased exp time  With new sonorous exp > insp bilateral wheeze. Hoover sign positive at mid inspiration. Regular rate and rhythm without murmur gallop or rub or increase P2 or edema.  Abd: no hsm, nl excursion. Ext warm without cyanosis or clubbing.   Assessment & Plan:

## 2011-01-22 NOTE — Progress Notes (Signed)
Summary: appt w wert  Phone Note Call from Patient Call back at Home Phone [phone]   Caller: Patient Call For: wert Reason for Call: Talk to Nurse Summary of Call: Patient started new bp med ,hyzarr, and is needing a f/u visit w/ Dr. Sherene Sires.  Asking to speak w/ nurse. Initial call taken by: Lehman Prom,  January 17, 2011 10:49 AM  Follow-up for Phone Call        Spoke with pt and he states over last month he has had increased cough, wheezing, head and chest congestion and is requesting to be seen. Pt set fro appt on 01-22-11 at 10:45 with MW. Carron Curie CMA  January 17, 2011 12:04 PM

## 2011-01-22 NOTE — Assessment & Plan Note (Signed)
Reviewed tremor may increase on dulera so will need to be balanced against benefits to cough

## 2011-01-22 NOTE — Patient Instructions (Signed)
See co-ordinator before leaving for sinus ct Prilosec before breakfast and pepcid 20 mg one at bedtime  Mucinex dm is take 2 every 12 hours if needed for cough or congestion Dulera 200 Take 2 puffs first thing in am and then another 2 puffs about 12 hours later.  Work on inhaler technique:  relax and gently blow all the way out then take a nice smooth deep breath back in, triggering the inhaler at same time you start breathing in.  Hold for up to 5 seconds if you can.  Rinse and gargle with water when done   If your mouth or throat starts to bother you,   I suggest you time the inhaler to your dental care and after using the inhaler(s) brush teeth and tongue with a baking soda containing toothpaste and when you rinse this out, gargle with it first to see if this helps your mouth and throat.     No mint menthol chocalate or oil based vitamins if you can avoid them  Keep your appt  For PFT's and visit

## 2011-01-22 NOTE — Assessment & Plan Note (Signed)
Bystolic, the most beta -1  selective Beta blocker available in sample form, with bisoprolol the most selective generic choice  on the market, are the best choices for beta blockers and definitely want to avoid aces so as not to add a layer of confusion to interpretation of his respiratory symptoms.

## 2011-01-23 ENCOUNTER — Encounter: Payer: Self-pay | Admitting: Internal Medicine

## 2011-01-24 ENCOUNTER — Ambulatory Visit (INDEPENDENT_AMBULATORY_CARE_PROVIDER_SITE_OTHER)
Admission: RE | Admit: 2011-01-24 | Discharge: 2011-01-24 | Disposition: A | Discharge status: Home / Self Care | Payer: Medicare Other | Source: Ambulatory Visit | Attending: Internal Medicine | Admitting: Internal Medicine

## 2011-01-24 DIAGNOSIS — J449 Chronic obstructive pulmonary disease, unspecified: Secondary | ICD-10-CM

## 2011-01-28 ENCOUNTER — Telehealth: Payer: Self-pay | Admitting: Internal Medicine

## 2011-01-28 NOTE — Telephone Encounter (Signed)
Brian Dunn spoke with the pt at 9:09am and advised of ct results. Carron Curie, CMA

## 2011-01-31 ENCOUNTER — Encounter: Payer: Self-pay | Admitting: Internal Medicine

## 2011-02-04 ENCOUNTER — Ambulatory Visit (INDEPENDENT_AMBULATORY_CARE_PROVIDER_SITE_OTHER): Payer: Medicare Other | Admitting: Internal Medicine

## 2011-02-04 ENCOUNTER — Encounter: Payer: Self-pay | Admitting: Internal Medicine

## 2011-02-04 VITALS — BP 120/68 | HR 58 | Temp 97.9°F | Ht 68.0 in | Wt 174.0 lb

## 2011-02-04 DIAGNOSIS — J449 Chronic obstructive pulmonary disease, unspecified: Secondary | ICD-10-CM

## 2011-02-04 DIAGNOSIS — K219 Gastro-esophageal reflux disease without esophagitis: Secondary | ICD-10-CM

## 2011-02-04 DIAGNOSIS — I10 Essential (primary) hypertension: Secondary | ICD-10-CM

## 2011-02-04 DIAGNOSIS — J4489 Other specified chronic obstructive pulmonary disease: Secondary | ICD-10-CM

## 2011-02-04 LAB — PULMONARY FUNCTION TEST

## 2011-02-04 NOTE — Assessment & Plan Note (Signed)
Of the three most common causes of chronic cough, only one (GERD)  can actually cause the other two (asthma and post nasal drip syndrome)  and perpetuate the cylce of cough inducing airway trauma, inflammation, heightened sensitivity to reflux which is prompted by the cough itself via a cyclical mechanism.    This may partially respond to steroids and look like asthma and post nasal drainage but never erradicated completely unless the cough and the secondary reflux are eliminated, preferably both at the same time.  While not intuitively obvious, many patients with chronic low grade reflux do not cough until there is a secondary insult that disturbs the protective epithelial barrier and exposes sensitive nerve endings.  This can be viral or direct physical injury such as with an endotracheal tube.   The point is that once this occurs, it is difficult to eliminate using anything but a maximally effective acid suppression regimen at least in the short run, accompanied by an appropriate diet to address non acid GERD.   It is diffictult to be sure whether the reason he's doing so well is asthma or GERD driven asthma so try off the dulera first

## 2011-02-04 NOTE — Patient Instructions (Addendum)
Ok to try  Stopping the dulera 200 when you complete your sample but if breathing or cough worse restart the dulera 100  Take 2 puffs first thing in am and then another 2 puffs about 12 hours later.    Stop corevidol, it has been replaced by bisoprolol which won't cause asthma or interfere with the dulera  Your lung function is excellent so you don't have significant copd or emphysema and never will

## 2011-02-04 NOTE — Assessment & Plan Note (Signed)
Not clear at this point he even has any primary airways dz.   Try off coreg first then elmininate if not reduce dose of dulera  See instructions

## 2011-02-04 NOTE — Progress Notes (Signed)
PFT done today. 

## 2011-02-04 NOTE — Progress Notes (Signed)
Subjective:    Patient ID: Brian Dunn, male    DOB: 1941/08/26, 70 y.o.   MRN: [id number]  HPI    Review of Systems     Objective:   Physical Exam        Assessment & Plan:   Subjective:    Patient ID: Brian Dunn, male    DOB: [birth year], 70 y.o.   MRN: [id number]  HPI  76 yowm quit smokjng in 2000 with no respiratory problems at all at that point and PFT's minimal airflow obst  02/04/2011   October 18, 2010  1st pulmonary office eval for recurrent "pna" x 2 years  = rattling cough/ fatigue/ nasty mucus and difficulty breathing to point of needing recliner requiring prolonged abx improved but not completely better from July thru  November 2011  but on day of ov completely better x for fatigue and cough when eat and still requires mucinex assoc with intermittent hb and nasal congestion but no purulent secretions.  rec Stop flovent/ crevidol/ fish oil/ finsopril  Benicar 40 mg one daily bystolic 20 mg one daily   December 07, 2010 ov breathing is better. Pt c/o wheezing, little cough x one week thinks caught a cold but this happened after resumed fisopril because ran out of benicar and changed back to ac   rec stay on ppi and off flovent ,  corvedol and fish and never got better cough then much worse 1st week in March more congestion with yellow mucus steadily worse esp when head hits pillow and after meals    01/22/2011  Ov much worse cough despite off ace, on bystolic and on ppi.  Esp severe at hs. No purulent sputum or overt hb  Rec CT sinus > neg.  rec Prilosec before breakfast and pepcid 20 mg one at bedtime  Mucinex dm is take 2 every 12 hours if needed for cough or congestion Dulera 200 Take 2 puffs first thing in am and then another 2 puffs about 12 hours later.  02/04/2011 ov for pft's cc cough and sob  doing much better but back on coreg again ? Why.  Pt denies any significant sore throat, dysphagia, itching, sneezing,  nasal congestion or excess/ purulent secretions,   fever, chills, sweats, unintended wt loss, pleuritic or exertional cp, hempoptysis, orthopnea pnd or leg swelling.    Also denies any obvious fluctuation of symptoms with weather or environmental changes or other aggravating or alleviating factors.     Past Medical History: COPD (ICD-496)     - HFA 71% p coaching 01/22/2011      - PFT's 02/04/2011 very minimal airflow obst   HYPOGONADISM (ICD-257.2) TREMOR, ESSENTIAL (ICD-333.1) CHEST PAIN (ICD-786.50) HIP FRACTURE (ICD-820.8) OSTEOPENIA (ICD-733.90) ALLERGIC RHINITIS (ICD-477.9) GERD (ICD-530.81) COUGH     - Sinus CT 01/24/11 There is mucosal thickening within the paranasal sinuses without  air-fluid levels. Neither ostiomeatal unit is patent.  HX, PERSONAL, MALIGNANCY, PROSTATE (ICD-V10.46) HYPERTENSION (ICD-401.9)      - Try off ace and coreg October 18, 2010 > much better under resumed ace > try off again December 07, 2010       - Confused with meds 02/04/2011 ,  Try off coreg again and rec continue bisoprolol         Review of Systems     Objective:   Physical Exam     amb slt hoarse wm nad/ slt nasal tone to voice wt 71 October 18, 2010  > 71  December 07, 2010  >  174 02/04/2011  HEENT mild turbinate edema.  Oropharynx no thrush or excess pnd or cobblestoning.  No JVD or cervical adenopathy. Mild accessory muscle hypertrophy. Trachea midline, nl thryroid. Chest was hyperinflated by percussion with diminished breath sounds and moderate increased exp time  With new sonorous exp > insp bilateral wheeze. Hoover sign positive at mid inspiration. Regular rate and rhythm without murmur gallop or rub or increase P2 or edema.  Abd: no hsm, nl excursion. Ext warm without cyanosis or clubbing.   Assessment & Plan:

## 2011-02-04 NOTE — Assessment & Plan Note (Signed)
Bystolic, the most beta -1  selective Beta blocker available in sample form, with bisoprolol the most selective generic choice  on the market, are the best choices for beta blockers and definitely want to avoid aces so as not to add a layer of confusion to interpretation of his respiratory symptoms.      Each maintenance medication was reviewed in detail including most importantly the difference between maintenance and as needed and under what circumstances the prns are to be used.  Please see instructions for details which were reviewed in writing and the patient given a copy.

## 2011-02-05 ENCOUNTER — Telehealth: Payer: Self-pay | Admitting: Internal Medicine

## 2011-02-05 NOTE — Telephone Encounter (Signed)
Will forward to Dr. Sherene Sires to inform him but the carvedilol is not even on pt med list.

## 2011-02-07 ENCOUNTER — Encounter: Payer: Self-pay | Admitting: Internal Medicine

## 2011-02-10 ENCOUNTER — Other Ambulatory Visit: Payer: Self-pay | Admitting: Internal Medicine

## 2011-03-21 ENCOUNTER — Other Ambulatory Visit: Payer: Self-pay | Admitting: *Deleted

## 2011-03-21 MED ORDER — BISOPROLOL FUMARATE 10 MG PO TABS: 10.0000 mg | ORAL_TABLET | Freq: Every day | ORAL | Status: DC

## 2011-03-22 NOTE — Op Note (Signed)
NAME:  Brian Dunn, Brian Dunn                            ACCOUNT NO.:  [id number]   MEDICAL RECORD NO.:  [id number]                   PATIENT TYPE:  AMB   LOCATION:  [facility]                                 FACILITY:  [facility]   PHYSICIAN:  Bertram Millard. Dahlstedt, M.D.          DATE OF BIRTH:  02/28/1941   DATE OF PROCEDURE:  01/18/2003  DATE OF DISCHARGE:                                 OPERATIVE REPORT   PREOPERATIVE DIAGNOSIS:  Prostate cancer.   POSTOPERATIVE DIAGNOSIS:  Prostate cancer.   PROCEDURE:  Insertion of palladium seeds in prostate using ultrasound and  fluoroscopic guidance.   SURGEON:  Bertram Millard. Retta Diones, M.D.   RADIATION ONCOLOGIST:  Wynn Banker, M.D.   ANESTHESIA:  General endotracheal.   COMPLICATIONS:  None.   INDICATIONS:  This is a 70 year old male with a diagnosis of prostate  cancer. He was actually diagnosed approximately one year ago.  He has chosen  to have radiation and has delayed onset of his treatment.  He has had  external beam therapy up to this point.  He is scheduled for brachytherapy  using collodion seeds.   The patient has been counseled on other treatments as well as other side  effects of radiation therapy.  He desires to proceed.   DESCRIPTION OF PROCEDURE:  The patient was administered a general anesthetic  and placed in the dorsal lithotomy position.  Genitalia and peritoneum were  prepped and draped.  His bladder was catheterized, and a rectal tube was  placed.  The ultrasound probe was placed transrectally.  The prostate was  imaged.  It was approximately 25 mL.  The fixation needles were placed  bilaterally.  Two gold seeds were also placed at the base of the prostate on  each side as future reference points.  The prostate was scanned from base to  apex.  The plane of reference for seed placement was 2 cm from the base.  After appropriate scanning for the prostate, palladium seeds were placed  using 20 separate needles.  Fluoroscopic  and ultrasound guidance was used to  place these seeds.  One seed looks like it became extraprostatic during the  treatment.  Other seed placement looked excellent, and after all seeds were  placed, ultrasound scan to the prostate revealed excellent position of the  seeds.  Cystoscopic view of the bladder revealed no  evidence of palladium seeds within the bladder.  A Foley catheter was then  placed and hooked to dependent drainage.   The patient tolerated the procedure well.  He was awakened, extubated and  taken to the PACU in stable condition.                                               Bertram Millard. Dahlstedt, M.D.  SMD/MEDQ  D:  01/18/2003  T:  01/18/2003  Job:  [id number]   cc:   Wynn Banker, M.D.  [address] [facility]  [place]  [place]  [address]  Fax: [phone]   Angelena Sole, M.D. [facility]

## 2011-03-28 ENCOUNTER — Telehealth: Payer: Self-pay | Admitting: Internal Medicine

## 2011-03-28 MED ORDER — BISOPROLOL FUMARATE 10 MG PO TABS: 10.0000 mg | ORAL_TABLET | Freq: Every day | ORAL | Status: DC

## 2011-03-28 NOTE — Telephone Encounter (Signed)
Refill for bioprolol sent to [facility]. [facility] for pt to be made aware this was done.

## 2011-04-19 ENCOUNTER — Other Ambulatory Visit: Payer: Self-pay | Admitting: Internal Medicine

## 2011-04-19 MED ORDER — BISOPROLOL FUMARATE 10 MG PO TABS: 10.0000 mg | ORAL_TABLET | Freq: Every day | ORAL | Status: DC

## 2011-05-30 ENCOUNTER — Other Ambulatory Visit: Payer: Self-pay | Admitting: Internal Medicine

## 2011-12-12 ENCOUNTER — Other Ambulatory Visit: Payer: Self-pay | Admitting: Internal Medicine

## 2011-12-20 ENCOUNTER — Encounter: Payer: Self-pay | Admitting: Gastroenterology

## 2012-03-25 ENCOUNTER — Ambulatory Visit (INDEPENDENT_AMBULATORY_CARE_PROVIDER_SITE_OTHER): Payer: Medicare Other | Admitting: Internal Medicine

## 2012-03-25 ENCOUNTER — Encounter: Payer: Self-pay | Admitting: Internal Medicine

## 2012-03-25 VITALS — BP 124/72 | HR 55 | Temp 97.7°F | Ht 70.5 in | Wt 176.8 lb

## 2012-03-25 DIAGNOSIS — R059 Cough, unspecified: Secondary | ICD-10-CM

## 2012-03-25 DIAGNOSIS — R05 Cough: Secondary | ICD-10-CM

## 2012-03-25 DIAGNOSIS — I10 Essential (primary) hypertension: Secondary | ICD-10-CM

## 2012-03-25 HISTORY — DX: Cough, unspecified: R05.9

## 2012-03-25 MED ORDER — OLMESARTAN-AMLODIPINE-HCTZ 40-5-12.5 MG PO TABS: ORAL_TABLET | ORAL | Status: DC

## 2012-03-25 MED ORDER — PREDNISONE (PAK) 10 MG PO TABS: ORAL_TABLET | ORAL | Status: AC

## 2012-03-25 NOTE — Assessment & Plan Note (Signed)
ACE inhibitors are problematic in  pts with airway complaints because  even experienced pulmonologists can't always distinguish ace effects from copd/asthma/pnds/ allergies etc.  By themselves they don't actually cause a problem, much like oxygen can't by itself start a fire, but they certainly serve as a powerful catalyst or enhancer for any "fire"  or inflammatory process in the upper airway, be it caused by an ET  tube or more commonly reflux (especially in the obese or pts with known GERD or who are on biphoshonates) or URI's, due to interference with bradykinin clearance.  The effects of acei on bradykinin levels occurs in 100% of pt's on acei (unless they surreptitiously stop the med!) but the classic cough is only reported in 5%.  This leaves 95% of pts on acei's  with a variety of syndromes including no identifiable symptom in most  vs non-specific symptoms that wax and wane depending on what other insult is occuring at the level of the upper airway.  Regardless, pts with difficult to control cough/ wheeze syndromes like his should avoid acei indefinitely Therefore try tribenzor 40/5/12.5 one daily and then regroup in 4-6 weeks

## 2012-03-25 NOTE — Assessment & Plan Note (Signed)
The most common causes of chronic cough in immunocompetent adults include the following: upper airway cough syndrome (UACS), previously referred to as postnasal drip syndrome (PNDS), which is caused by variety of rhinosinus conditions; (2) asthma; (3) GERD; (4) chronic bronchitis from cigarette smoking or other inhaled environmental irritants; (5) nonasthmatic eosinophilic bronchitis; and (6) bronchiectasis.   These conditions, singly or in combination, have accounted for up to 94% of the causes of chronic cough in prospective studies.   Other conditions have constituted no >6% of the causes in prospective studies These have included bronchogenic carcinoma, chronic interstitial pneumonia, sarcoidosis, left ventricular failure, ACEI-induced cough, and aspiration from a condition associated with pharyngeal dysfunction.   .Chronic cough is often simultaneously caused by more than one condition. A single cause has been found from 38 to 82% of the time, multiple causes from 18 to 62%. Multiply caused cough has been the result of three diseases up to 42% of the time.      This is most likely a form of   Upper airway cough syndrome, so named because it's frequently impossible to sort out how much is  CR/sinusitis with freq throat clearing (which can be related to primary GERD)   vs  causing  secondary (" extra esophageal")  GERD from wide swings in gastric pressure that occur with throat clearing, often  promoting self use of mint and menthol lozenges that reduce the lower esophageal sphincter tone and exacerbate the problem further in a cyclical fashion.   These are the same pts (now being labeled as having "irritable larynx syndrome" by some cough centers) who not infrequently have a history of having failed to tolerate ace inhibitors,  dry powder inhalers or biphosphonates or report having atypical reflux symptoms that don't respond to standard doses of PPI , and are easily confused as having aecopd or  asthma flares by even experienced allergists/ pulmonologists.   For now rx with prednisone x 6 days only and off ace (see hbp)

## 2012-03-25 NOTE — Patient Instructions (Addendum)
Stop hctz, mononopril and norvasc (amlodipine)  Start Tribenzor 40/5/12.5 one daily in it's place - if light headed standing cut in half  Omeprazole 20mg  one x 30 min  before breakfast daily  Prednisone 10 mg take  4 each am x 2 days,   2 each am x 2 days,  1 each am x2days and stop   GERD (REFLUX)  is an extremely common cause of respiratory symptoms, many times with no significant heartburn at all.    It can be treated with medication, but also with lifestyle changes including avoidance of late meals, excessive alcohol, smoking cessation, and avoid fatty foods, chocolate, peppermint, colas, red wine, and acidic juices such as orange juice.  NO MINT OR MENTHOL PRODUCTS SO NO COUGH DROPS  USE SUGARLESS CANDY INSTEAD (jolley ranchers or Stover's)  NO OIL BASED VITAMINS - use powdered substitutes.    Please schedule a follow up office visit in 4 weeks, sooner if needed

## 2012-03-25 NOTE — Progress Notes (Signed)
Subjective:     Patient ID: Brian Dunn, male    DOB: 20-Jun-1941 .   MRN: [id number]   Brief patient profile:  70 yowm quit smokjng in 2000 with no respiratory problems at all at that point and PFT's documented  minimal airflow obst  02/04/2011   October 18, 2010  1st pulmonary office eval for recurrent "pna" x 2 years  = rattling cough/ fatigue/ nasty mucus and difficulty breathing to point of needing recliner requiring prolonged abx improved but not completely better from July thru  November 2011  but on day of ov completely better x for fatigue and cough when eat and still requires mucinex assoc with intermittent hb and nasal congestion but no purulent secretions.  rec Stop flovent/ crevidol/ fish oil/ finsopril  Benicar 40 mg one daily bystolic 20 mg one daily   December 07, 2010 ov breathing is better. Pt c/o wheezing, little cough x one week thinks caught a cold but this happened after resumed fisopril because ran out of benicar and changed back to ac   rec stay on ppi and off flovent ,  corvedol and fish and never got better cough then much worse 1st week in March more congestion with yellow mucus steadily worse esp when head hits pillow and after meals    01/22/2011  Ov much worse cough despite off ace, on bystolic and on ppi.  Esp severe at hs. No purulent sputum or overt hb  Rec CT sinus > neg.  rec Prilosec before breakfast and pepcid 20 mg one at bedtime  Mucinex dm is take 2 every 12 hours if needed for cough or congestion Dulera 200 Take 2 puffs first thing in am and then another 2 puffs about 12 hours later.  02/04/2011 ov for pft's cc cough and sob  doing much better but back on coreg again ? Why.  rec Ok to try  Stopping the dulera 200 when you complete your sample but if breathing or cough worse restart the dulera 100  Take 2 puffs first thing in am and then another 2 puffs about 12 hours later.  Stop corevidol, it has been replaced by bisoprolol which won't cause asthma or  interfere with the dulera Your lung function is excellent so you don't have significant copd or emphysema and never will    03/25/2012  Still on ACEI  Cc 3 months cough and gradually  worsening doe with wheeze and congestion > min white mucus, assoc with hoarseness and wheeze only slt better on saba, ran out of dulera not sure it helped. No real variability to daily symptoms.  No overt hb or sinus complaints.  Thoroughly confused with meds/ instructions eg stopped the arb/hct over 12.5 mg difference between it and prev rx for hctz due to concerns with sun sensitivity which he's never experienced.  Sleeping ok without nocturnal  or early am exacerbation  of respiratory  c/o's or need for noct saba. Also denies any obvious fluctuation of symptoms with weather or environmental changes or other aggravating or alleviating factors except as outlined above   ROS  At present neg for  any significant sore throat, dysphagia, dental problems, itching, sneezing,  nasal congestion or excess/ purulent secretions, ear ache,   fever, chills, sweats, unintended wt loss, pleuritic or exertional cp, hemoptysis, palpitations, orthopnea pnd or leg swelling.  Also denies presyncope, palpitations, heartburn, abdominal pain, anorexia, nausea, vomiting, diarrhea  or change in bowel or urinary habits, change in stools or  urine, dysuria,hematuria,  rash, arthralgias, visual complaints, headache, numbness weakness or ataxia or problems with walking or coordination. No noted change in mood/affect or memory.                         Past Medical History: COPD (ICD-496)     - HFA 75% p coaching 01/22/2011      - PFT's 02/04/2011 very minimal airflow obst   HYPOGONADISM (ICD-257.2) TREMOR, ESSENTIAL (ICD-333.1) CHEST PAIN (ICD-786.50) HIP FRACTURE (ICD-820.8) OSTEOPENIA (ICD-733.90) ALLERGIC RHINITIS (ICD-477.9) GERD (ICD-530.81) COUGH     - Sinus CT 01/24/11 There is mucosal thickening within the paranasal sinuses  without  air-fluid levels. Neither ostiomeatal unit is patent.  HX, PERSONAL, MALIGNANCY, PROSTATE (ICD-V10.46) HYPERTENSION (ICD-401.9)      - Try off ace and coreg October 18, 2010 > much better under resumed ace > try off again December 07, 2010       - Confused with meds 02/04/2011 ,  Try off coreg again and rec continue bisoprolol              Objective:   Physical Exam     amb slt hoarse wm nad/ slt nasal tone to voice wt 173 October 18, 2010  > 183 December 07, 2010  >  174 02/04/2011 > 176 03/25/2012  HEENT mild turbinate edema.  Oropharynx no thrush or excess pnd or cobblestoning.  No JVD or cervical adenopathy. Mild accessory muscle hypertrophy. Trachea midline, nl thryroid. Chest was hyperinflated by percussion with diminished breath sounds and moderate increased exp time  With new sonorous exp > insp bilateral wheeze. Hoover sign positive at mid inspiration. Regular rate and rhythm without murmur gallop or rub or increase P2 or edema.  Abd: no hsm, nl excursion. Ext warm without cyanosis or clubbing.   Assessment & Plan:

## 2012-03-26 ENCOUNTER — Other Ambulatory Visit: Payer: Self-pay | Admitting: *Deleted

## 2012-03-26 MED ORDER — OMEPRAZOLE 20 MG PO CPDR: 20.0000 mg | DELAYED_RELEASE_CAPSULE | Freq: Every day | ORAL | Status: DC

## 2012-04-23 ENCOUNTER — Encounter: Payer: Self-pay | Admitting: Internal Medicine

## 2012-04-23 ENCOUNTER — Ambulatory Visit (INDEPENDENT_AMBULATORY_CARE_PROVIDER_SITE_OTHER): Payer: Medicare Other | Admitting: Internal Medicine

## 2012-04-23 VITALS — BP 110/64 | HR 56 | Temp 97.5°F | Ht 70.5 in | Wt 179.0 lb

## 2012-04-23 DIAGNOSIS — R059 Cough, unspecified: Secondary | ICD-10-CM

## 2012-04-23 DIAGNOSIS — R05 Cough: Secondary | ICD-10-CM

## 2012-04-23 DIAGNOSIS — I10 Essential (primary) hypertension: Secondary | ICD-10-CM

## 2012-04-23 MED ORDER — OLMESARTAN-AMLODIPINE-HCTZ 40-5-12.5 MG PO TABS: ORAL_TABLET | ORAL | Status: DC

## 2012-04-23 MED ORDER — BISOPROLOL FUMARATE 10 MG PO TABS: ORAL_TABLET | ORAL | Status: DC

## 2012-04-23 NOTE — Assessment & Plan Note (Addendum)
-   Try off acei again  03/25/2012 > cough resolved  I had an extended summary discussion with the patient and wife today lasting 15 to 20 minutes of a 25 minute visit on the following issues:   Most likely this is  Classic Upper airway cough syndrome, so named because it's frequently impossible to sort out how much is  CR/sinusitis with freq throat clearing (which can be related to primary GERD)   vs  causing  secondary (" extra esophageal")  GERD from wide swings in gastric pressure that occur with throat clearing, often  promoting self use of mint and menthol lozenges that reduce the lower esophageal sphincter tone and exacerbate the problem further in a cyclical fashion.   These are the same pts (now being labeled as having "irritable larynx syndrome" by some cough centers) who not infrequently have a history of having failed to tolerate ace inhibitors,  dry powder inhalers or biphosphonates or report having atypical reflux symptoms that don't respond to standard doses of PPI , and are easily confused as having aecopd or asthma flares by even experienced allergists/ pulmonologists.  Now that he's off acei may not even need ppi so ok to try off this also  Pulmonary f/u is prn

## 2012-04-23 NOTE — Patient Instructions (Addendum)
Stop the prilosec now to see if any choking comes back (it shouldn't because you're not on ACE inhibitors)  Reduce the bisoprolol to one half daily  Continue tribenzor but let Dr Jeanie Sewer know if not tolerating the medication for any reason   If you are satisfied with your treatment plan let your doctor know and he/she can either refill your medications or you can return here when your prescription runs out.     If in any way you are not 100% satisfied,  please tell us.  If 100% better, tell your friends!

## 2012-04-23 NOTE — Assessment & Plan Note (Signed)
Adequate control on present rx, reviewed options - he says beta blockers originally given for tremor, not bp, but he's doing great on bisoprolol so advised reducing dose by half and f/u Dr Jeanie Sewer  Clearly intolerant of ACEI, otherwise probably can choose any of the antihypertensives on his insurance plan.

## 2012-04-23 NOTE — Addendum Note (Signed)
Addended by: Sandrea Hughs B on: 04/23/2012 05:03 PM   Modules accepted: Orders

## 2012-04-23 NOTE — Progress Notes (Addendum)
Subjective:     Patient ID: RUPERT AZZARA, male    DOB: 10-06-1941    MRN: [id number]   Brief patient profile:  89 yowm quit smokjng in 2000 with no respiratory problems at all at that point and PFT's documented  minimal airflow obst  02/04/2011   October 18, 2010  1st pulmonary office eval for recurrent "pna" x 2 years  = rattling cough/ fatigue/ nasty mucus and difficulty breathing to point of needing recliner requiring prolonged abx improved but not completely better from July thru  November 2011  but on day of ov completely better x for fatigue and cough when eat and still requires mucinex assoc with intermittent hb and nasal congestion but no purulent secretions.  rec Stop flovent/ crevidol/ fish oil/ finsopril  Benicar 40 mg one daily bystolic 20 mg one daily   December 07, 2010 ov breathing is better. Pt c/o wheezing, little cough x one week thinks caught a cold but this happened after resumed fisopril because ran out of benicar and changed back to ac   rec stay on ppi and off flovent ,  corvedol and fish and never got better cough then much worse 1st week in March more congestion with yellow mucus steadily worse esp when head hits pillow and after meals    01/22/2011  Ov much worse cough despite off ace, on bystolic and on ppi.  Esp severe at hs. No purulent sputum or overt hb  Rec CT sinus > neg.  rec Prilosec before breakfast and pepcid 20 mg one at bedtime  Mucinex dm is take 2 every 12 hours if needed for cough or congestion Dulera 200 Take 2 puffs first thing in am and then another 2 puffs about 12 hours later.  02/04/2011 ov for pft's cc cough and sob  doing much better but back on coreg again ? Why.  rec Ok to try  Stopping the dulera 200 when you complete your sample but if breathing or cough worse restart the dulera 100  Take 2 puffs first thing in am and then another 2 puffs about 12 hours later.  Stop corevidol, it has been replaced by bisoprolol which won't cause asthma or  interfere with the dulera Your lung function is excellent so you don't have significant copd or emphysema and never will    03/25/2012  Still on ACEI  Cc 3 months cough and gradually  worsening doe with wheeze and congestion > min white mucus, assoc with hoarseness and wheeze only slt better on saba, ran out of dulera not sure it helped. No real variability to daily symptoms.  No overt hb or sinus complaints.  Thoroughly confused with meds/ instructions eg stopped the arb/hct over 12.5 mg difference between it and prev rx for hctz due to concerns with sun sensitivity which he's never experienced. rec Stop hctz, mononopril and norvasc (amlodipine) Start Tribenzor 40/5/12.5 one daily in it's place - if light headed standing cut in half Omeprazole 20mg  one x 30 min  before breakfast daily Prednisone 10 mg take  4 each am x 2 days,   2 each am x 2 days,  1 each am x2days and stop  GERD diet  04/23/2012 f/u ov/Abbigael Detlefsen cc better to his satisfaction s the need for any inhalers or cough meds. No choking spells, no sob or variability to symptoms, overt HB or sinus complaints   Sleeping ok without nocturnal  or early am exacerbation  of respiratory  c/o's or need  for noct saba. Also denies any obvious fluctuation of symptoms with weather or environmental changes or other aggravating or alleviating factors except as outlined above   ROS  At present neg for  any significant sore throat, dysphagia, dental problems, itching, sneezing,  nasal congestion or excess/ purulent secretions, ear ache,   fever, chills, sweats, unintended wt loss, pleuritic or exertional cp, hemoptysis, palpitations, orthopnea pnd or leg swelling.  Also denies presyncope, palpitations, heartburn, abdominal pain, anorexia, nausea, vomiting, diarrhea  or change in bowel or urinary habits, change in stools or urine, dysuria,hematuria,  rash, arthralgias, visual complaints, headache, numbness weakness or ataxia or problems with walking or  coordination. No noted change in mood/affect or memory.                         Past Medical History: COPD (ICD-496)     - HFA 75% p coaching 01/22/2011      - PFT's 02/04/2011 very minimal airflow obst   HYPOGONADISM (ICD-257.2) TREMOR, ESSENTIAL (ICD-333.1) CHEST PAIN (ICD-786.50) HIP FRACTURE (ICD-820.8) OSTEOPENIA (ICD-733.90) ALLERGIC RHINITIS (ICD-477.9) GERD (ICD-530.81) COUGH     - Sinus CT 01/24/11 There is mucosal thickening within the paranasal sinuses without  air-fluid levels. Neither ostiomeatal unit is patent. HX, PERSONAL, MALIGNANCY, PROSTATE (ICD-V10.46) HYPERTENSION (ICD-401.9)      - Try off ace and coreg October 18, 2010 > much better under resumed ace > try off again December 07, 2010       - Confused with meds 02/04/2011 ,  Try off coreg again and rec continue bisoprolol              Objective:   Physical Exam     amb min hoarse wm nad all smiles wt 173 October 18, 2010  >   174 02/04/2011 > 176 03/25/2012 > 04/23/2012  179 HEENT mild turbinate edema.  Oropharynx no thrush or excess pnd or cobblestoning.  No JVD or cervical adenopathy. Mild accessory muscle hypertrophy. Trachea midline, nl thryroid. Chest was hyperinflated by percussion with diminished breath sounds and moderate increased exp time  With new sonorous exp > insp bilateral wheeze. Hoover sign positive at mid inspiration. Regular rate and rhythm without murmur gallop or rub or increase P2 or edema.  Abd: no hsm, nl excursion. Ext warm without cyanosis or clubbing.   Assessment & Plan:   Outpatient Encounter Prescriptions as of 04/23/2012  Medication Sig Dispense Refill  . aspirin 81 MG tablet Take 81 mg by mouth daily.        . bisoprolol (ZEBETA) 10 MG tablet One half daily  90 tablet  3  . Calcium Carbonate-Vitamin D (CALCIUM 600 + D PO) Take by mouth daily.      . Coenzyme Q10 (CO Q-10) 75 MG CAPS Take 120 mg by mouth daily.       . Ferrous Sulfate (IRON) 325 (65 FE) MG TABS Take  by mouth daily.      . fluticasone (FLONASE) 50 MCG/ACT nasal spray 2 sprays by Nasal route daily.      . Ginseng 500 MG CAPS Take by mouth. 2 tabs daily      . Glucosamine 500 MG TABS Take by mouth daily.      Marland Kitchen loratadine (CLARITIN) 10 MG tablet Take 10 mg by mouth daily.      . Magnesium 400 MG CAPS Take by mouth. 3 tabs daily      . Methylsulfonylmethane 1500 MG TABS Take by  mouth. 2 tabs dabs daily      . Multiple Vitamin (MULTIVITAMIN) capsule Take 1 capsule by mouth daily.        . Olmesartan-Amlodipine-HCTZ 40-5-12.5 MG TABS One daily  30 tablet  11  . primidone (MYSOLINE) 50 MG tablet Take 25 mg by mouth at bedtime.      . Red Yeast Rice 600 MG CAPS Take by mouth daily.      . Tamsulosin HCl (FLOMAX) 0.4 MG CAPS Take 0.4 mg by mouth as needed.       Marland Kitchen UNABLE TO FIND 150 mcg daily. Med Name: sea kelp      . vitamin B-12 (CYANOCOBALAMIN) 500 MCG tablet Take 500 mcg by mouth daily.        Marland Kitchen DISCONTD: bisoprolol (ZEBETA) 10 MG tablet Take 1 tablet (10 mg total) by mouth daily.  90 tablet  3  . DISCONTD: Olmesartan-Amlodipine-HCTZ 40-5-12.5 MG TABS One daily  30 tablet    . DISCONTD: omeprazole (PRILOSEC) 20 MG capsule Take 1 capsule (20 mg total) by mouth daily.  90 capsule  3

## 2012-05-29 ENCOUNTER — Ambulatory Visit (INDEPENDENT_AMBULATORY_CARE_PROVIDER_SITE_OTHER): Payer: Medicare Other | Admitting: Pulmonary Disease

## 2012-05-29 ENCOUNTER — Encounter: Payer: Self-pay | Admitting: Pulmonary Disease

## 2012-05-29 VITALS — BP 110/68 | HR 63 | Temp 98.0°F | Ht 70.5 in | Wt 176.6 lb

## 2012-05-29 DIAGNOSIS — J209 Acute bronchitis, unspecified: Secondary | ICD-10-CM

## 2012-05-29 HISTORY — DX: Acute bronchitis, unspecified: J20.9

## 2012-05-29 MED ORDER — AZITHROMYCIN 250 MG PO TABS: ORAL_TABLET | ORAL | Status: AC

## 2012-05-29 MED ORDER — ALBUTEROL SULFATE HFA 108 (90 BASE) MCG/ACT IN AERS: 2.0000 | INHALATION_SPRAY | Freq: Four times a day (QID) | RESPIRATORY_TRACT | Status: DC | PRN

## 2012-05-29 NOTE — Assessment & Plan Note (Signed)
Likely viral.  Advised to continue symptomatic treatment and get plenty of rest.  Have given proair inhaler to use as needed.  I have given him script for Zpak, but advised not to fill unless his symptoms get worse.  He is to call if he feels like he is getting worse.

## 2012-05-29 NOTE — Progress Notes (Signed)
Chief Complaint  Patient presents with  . Acute visit    MW pt. C/o chest congestion, nasal congestion, wheezing, chest tx, slight increase SOB, slight cough worse x 1 week    History of Present Illness: Brian Dunn is a 71 y.o. male former smoker with asthma  He developed sinus congestion, runny nose, and cough 5 days ago.  He has cough with clear sputum.  He denies fever, but did have chills.  He was feeling nauseous, but has been eating okay.  He has been trying to drink more water.  He had more chest congestion and wheeze, but this seems to be getting better.  He denies skin rash, joint swelling, or gland swelling.    He has been using flonase and claritin daily.  He does not have an albuterol inhaler.  He is followed by Dr. Sherene Sires, and was last seen in June.   Past Medical History  Diagnosis Date  . COPD (chronic obstructive pulmonary disease)   . Hypogonadism male   . Essential and other specified forms of tremor   . Chest pain, unspecified   . Hip fracture   . Osteopenia   . Allergic rhinitis   . GERD (gastroesophageal reflux disease)   . Personal history of malignant neoplasm of prostate   . Hypertension     Past Surgical History  Procedure Date  . Coronary angioplasty with stent placement 2008  . Insertion prostate radiation seed     Outpatient Encounter Prescriptions as of 05/29/2012  Medication Sig Dispense Refill  . aspirin 81 MG tablet Take 81 mg by mouth daily.        . bisoprolol (ZEBETA) 10 MG tablet One half daily  90 tablet  3  . Calcium Carbonate-Vitamin D (CALCIUM 600 + D PO) Take by mouth daily.      . Coenzyme Q10 (CO Q-10) 75 MG CAPS Take 120 mg by mouth daily.       . Ferrous Sulfate (IRON) 325 (65 FE) MG TABS Take by mouth daily.      . fluticasone (FLONASE) 50 MCG/ACT nasal spray 2 sprays by Nasal route daily.      . Glucosamine 500 MG TABS Take by mouth daily.      Marland Kitchen loratadine (CLARITIN) 10 MG tablet Take 10 mg by mouth daily.      . Magnesium  400 MG CAPS Take by mouth. 3 tabs daily      . Methylsulfonylmethane 1500 MG TABS Take by mouth. 2 tabs dabs daily      . Multiple Vitamin (MULTIVITAMIN) capsule Take 1 capsule by mouth daily.        . Olmesartan-Amlodipine-HCTZ 40-5-12.5 MG TABS One daily  30 tablet  11  . primidone (MYSOLINE) 50 MG tablet Take 25 mg by mouth at bedtime.      . Red Yeast Rice 600 MG CAPS Take by mouth daily.      . Tamsulosin HCl (FLOMAX) 0.4 MG CAPS Take 0.4 mg by mouth as needed.       Marland Kitchen UNABLE TO FIND 150 mcg daily. Med Name: sea kelp      . vitamin B-12 (CYANOCOBALAMIN) 500 MCG tablet Take 500 mcg by mouth daily.        Marland Kitchen DISCONTD: Ginseng 500 MG CAPS Take by mouth. 2 tabs daily        Allergies  Allergen Reactions  . Morphine     REACTION: rash  . Penicillins     REACTION: swelling  Physical Exam:  Blood pressure 110/68, pulse 63, temperature 98 F (36.7 C), temperature source Oral, height 5' 10.5" (1.791 m), weight 176 lb 9.6 oz (80.105 kg), SpO2 97.00%. Body mass index is 24.98 kg/(m^2). Wt Readings from Last 2 Encounters:  05/29/12 176 lb 9.6 oz (80.105 kg)  04/23/12 179 lb (81.194 kg)    General - No distress ENT - TM clear, no sinus tenderness, no oral exudate, no LAN, no thyromegaly, mild erythema posterior pharynx Cardiac - s1s2 regular, no murmur, pulses symmetric, no edema Chest - normal respiratory excursion, good air entry, no wheeze/rales/dullness Back - no focal tenderness Abd - soft, non-tender, no organomegaly, + bowel sounds Ext - normal motor strength Neuro - Cranial nerves are normal. PERLA. EOM's intact. Skin - no discernible active dermatitis, erythema, urticaria or inflammatory process. Psych - normal mood, and behavior.    Assessment/Plan:  Coralyn Helling, MD [facility] Pager:  [phone] 05/29/2012, 2:16 PM

## 2012-05-29 NOTE — Patient Instructions (Addendum)
Proair two puffs as needed for cough, wheeze, or chest congestion Zithromax use as directed>>do not take unless symptoms get worse Follow up with Dr. Sherene Sires in 4 to 6 weeks

## 2012-06-18 ENCOUNTER — Ambulatory Visit (INDEPENDENT_AMBULATORY_CARE_PROVIDER_SITE_OTHER): Payer: Medicare Other | Admitting: Internal Medicine

## 2012-06-18 ENCOUNTER — Encounter: Payer: Self-pay | Admitting: Internal Medicine

## 2012-06-18 VITALS — BP 110/68 | HR 84 | Temp 97.7°F | Ht 70.5 in | Wt 175.8 lb

## 2012-06-18 DIAGNOSIS — J45909 Unspecified asthma, uncomplicated: Secondary | ICD-10-CM | POA: Insufficient documentation

## 2012-06-18 HISTORY — DX: Unspecified asthma, uncomplicated: J45.909

## 2012-06-18 MED ORDER — MOMETASONE FURO-FORMOTEROL FUM 100-5 MCG/ACT IN AERO: 2.0000 | INHALATION_SPRAY | Freq: Two times a day (BID) | RESPIRATORY_TRACT | Status: DC

## 2012-06-18 MED ORDER — FAMOTIDINE 20 MG PO TABS: ORAL_TABLET | ORAL | Status: DC

## 2012-06-18 MED ORDER — PREDNISONE (PAK) 10 MG PO TABS: ORAL_TABLET | ORAL | Status: AC

## 2012-06-18 MED ORDER — LEVOFLOXACIN 750 MG PO TABS: 750.0000 mg | ORAL_TABLET | Freq: Every day | ORAL | Status: AC

## 2012-06-18 NOTE — Patient Instructions (Addendum)
Prednisone 10 mg take  4 each am x 2 days,   2 each am x 2 days,  1 each am x2days and stop   Levaquin 750 mg one daily x 5 days  Pepcid 20 mg po at bedtime  Dulera 100 Take 2 puffs first thing in am and then another 2 puffs about 12 hours later.   GERD (REFLUX)  is an extremely common cause of respiratory symptoms, many times with no significant heartburn at all.    It can be treated with medication, but also with lifestyle changes including avoidance of late meals, excessive alcohol, smoking cessation, and avoid fatty foods, chocolate, peppermint, colas, red wine, and acidic juices such as orange juice.  NO MINT OR MENTHOL PRODUCTS SO NO COUGH DROPS  USE SUGARLESS CANDY INSTEAD (jolley ranchers or Stover's)  NO OIL BASED VITAMINS - use powdered substitutes.   Please schedule a follow up office visit in 2 weeks, sooner if needed with all meds and inhalers in hand

## 2012-06-18 NOTE — Progress Notes (Addendum)
Subjective:     Patient ID: Brian Dunn, male    DOB: 1941/02/29    MRN: [id number]   Brief patient profile:  89 yowm quit smoking in 2000 with no respiratory problems at all at that point and PFT's documented  minimal airflow obst  02/04/2011   October 18, 2010  1st pulmonary office eval for recurrent "pna" x 2 years  = rattling cough/ fatigue/ nasty mucus and difficulty breathing to point of needing recliner requiring prolonged abx improved but not completely better from July thru  November 2011  but on day of ov completely better x for fatigue and cough when eat and still requires mucinex assoc with intermittent hb and nasal congestion but no purulent secretions.  rec Stop flovent/ crevidol/ fish oil/ finsopril  Benicar 40 mg one daily bystolic 20 mg one daily   December 07, 2010 ov breathing is better. Pt c/o wheezing, little cough x one week thinks caught a cold but this happened after resumed fisopril because ran out of benicar and changed back to ac   rec stay on ppi and off flovent ,  corvedol and fish and never got better cough then much worse 1st week in March more congestion with yellow mucus steadily worse esp when head hits pillow and after meals    01/22/2011  Ov much worse cough despite off ace, on bystolic and on ppi.  Esp severe at hs. No purulent sputum or overt hb  Rec CT sinus > neg.  rec Prilosec before breakfast and pepcid 20 mg one at bedtime  Mucinex dm is take 2 every 12 hours if needed for cough or congestion Dulera 200 Take 2 puffs first thing in am and then another 2 puffs about 12 hours later.  02/04/2011 ov for pft's cc cough and sob  doing much better but back on coreg again ? Why.  rec Ok to try  Stopping the dulera 200 when you complete your sample but if breathing or cough worse restart the dulera 100  Take 2 puffs first thing in am and then another 2 puffs about 12 hours later.  Stop corevidol, it has been replaced by bisoprolol which won't cause asthma or  interfere with the dulera Your lung function is excellent so you don't have significant copd or emphysema and never will    03/25/2012  Still on ACEI  Cc 3 months cough and gradually  worsening doe with wheeze and congestion > min white mucus, assoc with hoarseness and wheeze only slt better on saba, ran out of dulera not sure it helped. No real variability to daily symptoms.  No overt hb or sinus complaints.  Thoroughly confused with meds/ instructions eg stopped the arb/hct over 12.5 mg difference between it and prev rx for hctz due to concerns with sun sensitivity which he's never experienced. rec Stop hctz, mononopril and norvasc (amlodipine) Start Tribenzor 40/5/12.5 one daily in it's place - if light headed standing cut in half Omeprazole 20mg  one x 30 min  before breakfast daily Prednisone 10 mg take  4 each am x 2 days,   2 each am x 2 days,  1 each am x2days and stop  GERD diet  04/23/2012 f/u ov/Wert cc better to his satisfaction s the need for any inhalers or cough meds. No choking spells, no sob or variability to symptoms, overt HB or sinus complaints rec Stop the prilosec now to see if any choking comes back (it shouldn't because you're not on  ACE inhibitors) Reduce the bisoprolol to one half daily Continue tribenzor but let Dr Jeanie Sewer know if not tolerating the medication for any reason   05/29/12 Sood/ ov with acute flare of sinus complaints rx rec Proair two puffs as needed for cough, wheeze, or chest congestion Zithromax use as directed>>do not take unless symptoms get worse   06/18/2012 f/u ov/Wert back on prilosec 20 mg q am ac x 2 weeks with recurrent cough x 05/22/12 no bette  p  Proair(though note poor technique - see below) , did take z pack, seems worse at hs, mucus is yellow esp in am, assoc with nasal congestion but no overt HB and worseing doe as well   Sleeping ok without nocturnal  or early am exacerbation  of respiratory  c/o's or need for noct saba. Also denies  any obvious fluctuation of symptoms with weather or environmental changes or other aggravating or alleviating factors except as outlined above   ROS  At present neg for  any significant sore throat, dysphagia, dental problems, itching, sneezing,  nasal congestion or excess/ purulent secretions, ear ache,   fever, chills, sweats, unintended wt loss, pleuritic or exertional cp, hemoptysis, palpitations, orthopnea pnd or leg swelling.  Also denies presyncope, palpitations, heartburn, abdominal pain, anorexia, nausea, vomiting, diarrhea  or change in bowel or urinary habits, change in stools or urine, dysuria,hematuria,  rash, arthralgias, visual complaints, headache, numbness weakness or ataxia or problems with walking or coordination. No noted change in mood/affect or memory.         Past Medical History: COPD (ICD-496)     - HFA 75% p coaching 01/22/2011      - PFT's 02/04/2011 very minimal airflow obst   HYPOGONADISM (ICD-257.2) TREMOR, ESSENTIAL (ICD-333.1) CHEST PAIN (ICD-786.50) HIP FRACTURE (ICD-820.8) OSTEOPENIA (ICD-733.90) ALLERGIC RHINITIS (ICD-477.9) GERD (ICD-530.81) COUGH     - Sinus CT 01/24/11 There is mucosal thickening within the paranasal sinuses without  air-fluid levels. Neither ostiomeatal unit is patent. HX, PERSONAL, MALIGNANCY, PROSTATE (ICD-V10.46) HYPERTENSION (ICD-401.9)      - Try off ace and coreg October 18, 2010 > much better under resumed ace > try off again December 07, 2010       - Confused with meds 02/04/2011 ,  Try off coreg again and rec continue bisoprolol              Objective:   Physical Exam     amb min hoarse wm nad  wt 173 October 18, 2010  >   174 02/04/2011 > 176 03/25/2012 > 04/23/2012  179 > 06/18/2012 175 HEENT mild turbinate edema.  Oropharynx no thrush or excess pnd or cobblestoning.  No JVD or cervical adenopathy. Mild accessory muscle hypertrophy. Trachea midline, nl thryroid. Chest was hyperinflated by percussion with diminished  breath sounds and moderate increased exp time  With new sonorous exp > insp bilateral wheeze. Hoover sign positive at mid inspiration. Regular rate and rhythm without murmur gallop or rub or increase P2 or edema.  Abd: no hsm, nl excursion. Ext warm without cyanosis or clubbing.   Assessment & Plan:

## 2012-06-19 NOTE — Assessment & Plan Note (Addendum)
Recurrent symptoms point strongly to cough variant  astham vs upper airway cough syndrome so try dulera 100 Take 2 puffs first thing in am and then another 2 puffs about 12 hours later along with short course of prednisone and 5 days course of levaquin since sputum is again purulent s obvious sinusitis.  Also max rx for gerd and avoid all the over the counters/ supplements that may conflict with rx.  The proper method of use, as well as anticipated side effects, of a metered-dose inhaler are discussed and demonstrated to the patient. Improved effectiveness after extensive coaching during this visit to a level of approximately  75%    Each maintenance medication was reviewed in detail including most importantly the difference between maintenance and as needed and under what circumstances the prns are to be used.  Please see instructions for details which were reviewed in writing and the patient given a copy.

## 2012-06-29 ENCOUNTER — Other Ambulatory Visit: Payer: Self-pay | Admitting: Internal Medicine

## 2012-07-02 ENCOUNTER — Encounter: Payer: Self-pay | Admitting: Internal Medicine

## 2012-07-02 ENCOUNTER — Telehealth: Payer: Self-pay | Admitting: Internal Medicine

## 2012-07-02 ENCOUNTER — Ambulatory Visit (INDEPENDENT_AMBULATORY_CARE_PROVIDER_SITE_OTHER): Payer: Medicare Other | Admitting: Internal Medicine

## 2012-07-02 VITALS — BP 126/64 | HR 56 | Temp 97.6°F | Ht 70.5 in | Wt 174.6 lb

## 2012-07-02 DIAGNOSIS — J45909 Unspecified asthma, uncomplicated: Secondary | ICD-10-CM

## 2012-07-02 MED ORDER — OMEPRAZOLE 20 MG PO CPDR: 20.0000 mg | DELAYED_RELEASE_CAPSULE | Freq: Every day | ORAL | Status: DC

## 2012-07-02 MED ORDER — BISOPROLOL FUMARATE 5 MG PO TABS: 5.0000 mg | ORAL_TABLET | Freq: Every day | ORAL | Status: DC

## 2012-07-02 NOTE — Progress Notes (Signed)
Subjective:     Patient ID: Brian Dunn, male    DOB: December 22, 1940    MRN: [id number]   Brief patient profile:  9 yowm quit smoking in 2000 with no respiratory problems at all at that point and PFT's documented  minimal airflow obst  02/04/2011   October 18, 2010  1st pulmonary office eval for recurrent "pna" x 2 years  = rattling cough/ fatigue/ nasty mucus and difficulty breathing to point of needing recliner requiring prolonged abx improved but not completely better from July thru  November 2011  but on day of ov completely better x for fatigue and cough when eat and still requires mucinex assoc with intermittent hb and nasal congestion but no purulent secretions.  rec Stop flovent/ crevidol/ fish oil/ finsopril  Benicar 40 mg one daily bystolic 20 mg one daily   December 07, 2010 ov breathing is better. Pt c/o wheezing, little cough x one week thinks caught a cold but this happened after resumed fisopril because ran out of benicar and changed back to ac   rec stay on ppi and off flovent ,  corvedol and fish and never got better cough then much worse 1st week in March more congestion with yellow mucus steadily worse esp when head hits pillow and after meals    01/22/2011  Ov much worse cough despite off ace, on bystolic and on ppi.  Esp severe at hs. No purulent sputum or overt hb  Rec CT sinus > neg.  rec Prilosec before breakfast and pepcid 20 mg one at bedtime  Mucinex dm is take 2 every 12 hours if needed for cough or congestion Dulera 200 Take 2 puffs first thing in am and then another 2 puffs about 12 hours later.  02/04/2011 ov for pft's cc cough and sob  doing much better but back on coreg again ? Why.  rec Ok to try  Stopping the dulera 200 when you complete your sample but if breathing or cough worse restart the dulera 100  Take 2 puffs first thing in am and then another 2 puffs about 12 hours later.  Stop corevidol, it has been replaced by bisoprolol which won't cause asthma or  interfere with the dulera Your lung function is excellent so you don't have significant copd or emphysema and never will    03/25/2012  Still on ACEI  Cc 3 months cough and gradually  worsening doe with wheeze and congestion > min white mucus, assoc with hoarseness and wheeze only slt better on saba, ran out of dulera not sure it helped. No real variability to daily symptoms.  No overt hb or sinus complaints.  Thoroughly confused with meds/ instructions eg stopped the arb/hct over 12.5 mg difference between it and prev rx for hctz due to concerns with sun sensitivity which he's never experienced. rec Stop hctz, mononopril and norvasc (amlodipine) Start Tribenzor 40/5/12.5 one daily in it's place - if light headed standing cut in half Omeprazole 20mg  one x 30 min  before breakfast daily Prednisone 10 mg take  4 each am x 2 days,   2 each am x 2 days,  1 each am x2days and stop  GERD diet  04/23/2012 f/u ov/Brian Dunn cc better to his satisfaction s the need for any inhalers or cough meds. No choking spells, no sob or variability to symptoms, overt HB or sinus complaints rec Stop the prilosec now to see if any choking comes back (it shouldn't because you're not on  ACE inhibitors) Reduce the bisoprolol to one half daily Continue tribenzor but let Dr Jeanie Sewer know if not tolerating the medication for any reason   05/29/12 Brian Dunn/ ov with acute flare of sinus complaints rx rec Proair two puffs as needed for cough, wheeze, or chest congestion Zithromax use as directed>>do not take unless symptoms get worse   06/18/2012 f/u ov/Brian Dunn back on prilosec 20 mg q am ac x 2 weeks with recurrent cough x 05/22/12 no better p  Proair(though note poor technique - see below) , did take z pack, seems worse at hs, mucus is yellow esp in am, assoc with nasal congestion but no overt HB and worseing doe as well rec Prednisone 10 mg take  4 each am x 2 days,   2 each am x 2 days,  1 each am x2days and stop  Levaquin 750 mg one  daily x 5 days Pepcid 20 mg po at bedtime Dulera 100 Take 2 puffs first thing in am and then another 2 puffs about 12 hours later.  GERD diet   07/02/2012 f/u ov/Brian Dunn cc marked improvement  >no sob,  No unusual cough, purulent sputum or sinus/hb symptoms on present rx.      Sleeping ok without nocturnal  or early am exacerbation  of respiratory  c/o's or need for noct saba. Also denies any obvious fluctuation of symptoms with weather or environmental changes or other aggravating or alleviating factors except as outlined above   ROS  At present neg for  any significant sore throat, dysphagia, dental problems, itching, sneezing,  nasal congestion or excess/ purulent secretions, ear ache,   fever, chills, sweats, unintended wt loss, pleuritic or exertional cp, hemoptysis, palpitations, orthopnea pnd or leg swelling.  Also denies presyncope, palpitations, heartburn, abdominal pain, anorexia, nausea, vomiting, diarrhea  or change in bowel or urinary habits, change in stools or urine, dysuria,hematuria,  rash, arthralgias, visual complaints, headache, numbness weakness or ataxia or problems with walking or coordination. No noted change in mood/affect or memory.         Past Medical History: COPD (ICD-496)     - HFA 75% p coaching 01/22/2011      - PFT's 02/04/2011 very minimal airflow obst   HYPOGONADISM (ICD-257.2) TREMOR, ESSENTIAL (ICD-333.1) CHEST PAIN (ICD-786.50) HIP FRACTURE (ICD-820.8) OSTEOPENIA (ICD-733.90) ALLERGIC RHINITIS (ICD-477.9) GERD (ICD-530.81) COUGH     - Sinus CT 01/24/11 There is mucosal thickening within the paranasal sinuses without  air-fluid levels. Neither ostiomeatal unit is patent. HX, PERSONAL, MALIGNANCY, PROSTATE (ICD-V10.46) HYPERTENSION (ICD-401.9)      - Try off ace and coreg October 18, 2010 > much better under resumed ace > try off again December 07, 2010       - Confused with meds 02/04/2011 ,  Try off coreg again and rec continue bisoprolol               Objective:   Physical Exam     amb min hoarse wm nad  wt 173 October 18, 2010  >   174 02/04/2011 >  > 06/18/2012 175 > 07/02/2012  174 HEENT mild turbinate edema.  Oropharynx no thrush or excess pnd or cobblestoning.  No JVD or cervical adenopathy. Mild accessory muscle hypertrophy. Trachea midline, nl thryroid. Chest was hyperinflated by percussion with diminished breath sounds and moderate increased exp time  With new sonorous exp > insp bilateral wheeze. Hoover sign positive at mid inspiration. Regular rate and rhythm without murmur gallop or rub or increase P2  or edema.  Abd: no hsm, nl excursion. Ext warm without cyanosis or clubbing.   Assessment & Plan:

## 2012-07-02 NOTE — Patient Instructions (Addendum)
Continue dulera 100 2 every 12 hours but work maintaining  perfect inhaler technique:  relax and gently blow all the way out then take a nice smooth deep breath back in, triggering the inhaler at same time you start breathing in.  Hold for up to 5 seconds if you can.  Rinse and gargle with water when done   If your mouth or throat starts to bother you,   I suggest you time the inhaler to your dental care and after using the inhaler(s) brush teeth and tongue with a baking soda containing toothpaste and when you rinse this out, gargle with it first to see if this helps your mouth and throat.     Reduce bisoprolol 5 mg one half daily and let Dr Jeanie Sewer do your follow up  It is ok to substitute the olmesartan with generic diovan=valsartan but not an ace inhibitor  Please schedule a follow up visit in 3 months but call sooner if needed

## 2012-07-02 NOTE — Telephone Encounter (Signed)
Rx for omeprazole refilled [facility] for the pt to be made aware.

## 2012-07-02 NOTE — Assessment & Plan Note (Signed)
All goals of chronic asthma control met including optimal function and elimination of symptoms with minimal need for rescue therapy.  Contingencies discussed in full including contacting this office immediately if not controlling the symptoms using the rule of two's.     The proper method of use, as well as anticipated side effects, of a metered-dose inhaler are discussed and demonstrated to the patient. Improved effectiveness after extensive coaching during this visit to a level of approximately  90%  See instructions for specific recommendations which were reviewed directly with the patient who was given a copy with highlighter outlining the key components.

## 2012-07-03 ENCOUNTER — Ambulatory Visit: Payer: Medicare Other | Admitting: Internal Medicine

## 2012-08-27 ENCOUNTER — Other Ambulatory Visit: Payer: Self-pay | Admitting: *Deleted

## 2012-08-27 MED ORDER — BISOPROLOL FUMARATE 5 MG PO TABS: 5.0000 mg | ORAL_TABLET | Freq: Every day | ORAL | Status: AC

## 2012-09-09 ENCOUNTER — Other Ambulatory Visit: Payer: Self-pay | Admitting: *Deleted

## 2012-09-09 MED ORDER — OMEPRAZOLE 20 MG PO CPDR: 20.0000 mg | DELAYED_RELEASE_CAPSULE | Freq: Every day | ORAL | Status: DC

## 2012-09-30 ENCOUNTER — Encounter: Payer: Self-pay | Admitting: Internal Medicine

## 2012-09-30 ENCOUNTER — Ambulatory Visit (INDEPENDENT_AMBULATORY_CARE_PROVIDER_SITE_OTHER): Payer: Medicare Other | Admitting: Internal Medicine

## 2012-09-30 VITALS — BP 120/70 | HR 65 | Temp 97.5°F | Ht 70.5 in | Wt 185.0 lb

## 2012-09-30 DIAGNOSIS — R059 Cough, unspecified: Secondary | ICD-10-CM

## 2012-09-30 DIAGNOSIS — J449 Chronic obstructive pulmonary disease, unspecified: Secondary | ICD-10-CM

## 2012-09-30 DIAGNOSIS — R05 Cough: Secondary | ICD-10-CM

## 2012-09-30 DIAGNOSIS — I10 Essential (primary) hypertension: Secondary | ICD-10-CM

## 2012-09-30 NOTE — Patient Instructions (Addendum)
Ok to change dulera to where you only only take it every 12 hours if needed for breathing  Change flonase to one spray in am and one in pm to see if helps the nasal drainage and if not option is dymista twice daily (but not avail in generic)  Stop losartan, amlodipine and hctz and take tribenzor 40-5-12.5 and return to Dr Jeanie Sewer for further blood pressure treatment if cough goes away   If you are satisfied with your treatment plan let your doctor know and he/she can either refill your medications or you can return here when your prescription runs out.     If in any way you are not 100% satisfied,  please tell us.  If 100% better, tell your friends!

## 2012-09-30 NOTE — Progress Notes (Signed)
Subjective:     Patient ID: Brian Dunn, male    DOB: [birth year]    MRN: [id number]   Brief patient profile:  71  yowm quit smoking in 2000 with no respiratory problems at all at that point and PFT's documented  minimal airflow obst  02/04/2011   October 18, 2010  1st pulmonary office eval for recurrent "pna" x 2 years  = rattling cough/ fatigue/ nasty mucus and difficulty breathing to point of needing recliner requiring prolonged abx improved but not completely better from July thru  November 2011  but on day of ov completely better x for fatigue and cough when eat and still requires mucinex assoc with intermittent hb and nasal congestion but no purulent secretions.  rec Stop flovent/ crevidol/ fish oil/ finsopril  Benicar 40 mg one daily bystolic 20 mg one daily     December 07, 2010       - Confused with meds 02/04/2011 ,  Try off coreg again and rec continue bisoprolol  01/22/2011  Ov much worse cough despite off ace, on bystolic and on ppi.  Esp severe at hs. No purulent sputum or overt hb  Rec CT sinus > neg.  rec Prilosec before breakfast and pepcid 20 mg one at bedtime  Mucinex dm is take 2 every 12 hours if needed for cough or congestion Dulera 200 Take 2 puffs first thing in am and then another 2 puffs about 12 hours later.  02/04/2011 ov for pft's cc cough and sob  doing much better but back on coreg again ? Why.  rec Ok to try  Stopping the dulera 200 when you complete your sample but if breathing or cough worse restart the dulera 100  Take 2 puffs first thing in am and then another 2 puffs about 12 hours later.  Stop corevidol, it has been replaced by bisoprolol which won't cause asthma or interfere with the dulera Your lung function is excellent so you don't have significant copd or emphysema and never will    03/25/2012  Still on ACEI  Cc 3 months cough and gradually  worsening doe with wheeze and congestion > min white mucus, assoc with hoarseness and wheeze only slt better on  saba, ran out of dulera not sure it helped. No real variability to daily symptoms.  No overt hb or sinus complaints.  Thoroughly confused with meds/ instructions eg stopped the arb/hct over 12.5 mg difference between it and prev rx for hctz due to concerns with sun sensitivity which he's never experienced. rec Stop hctz, mononopril and norvasc (amlodipine) Start Tribenzor 40/5/12.5 one daily in it's place - if light headed standing cut in half Omeprazole 20mg  one x 30 min  before breakfast daily Prednisone 10 mg take  4 each am x 2 days,   2 each am x 2 days,  1 each am x2days and stop  GERD diet  04/23/2012 f/u ov/Brian Dunn cc better to his satisfaction s the need for any inhalers or cough meds. No choking spells, no sob or variability to symptoms, overt HB or sinus complaints rec Stop the prilosec now to see if any choking comes back (it shouldn't because you're not on ACE inhibitors) Reduce the bisoprolol to one half daily Continue tribenzor but let Dr Jeanie Sewer know if not tolerating the medication for any reason   05/29/12 Sood/ ov with acute flare of sinus complaints rx rec Proair two puffs as needed for cough, wheeze, or chest congestion Zithromax use as  directed>>do not take unless symptoms get worse   06/18/2012 f/u ov/Brian Dunn back on prilosec 20 mg q am ac x 2 weeks with recurrent cough x 05/22/12 no better p  Proair(though note poor technique - see below) , did take z pack, seems worse at hs, mucus is yellow esp in am, assoc with nasal congestion but no overt HB and worseing doe as well rec Prednisone 10 mg take  4 each am x 2 days,   2 each am x 2 days,  1 each am x2days and stop  Levaquin 750 mg one daily x 5 days Pepcid 20 mg po at bedtime Dulera 100 Take 2 puffs first thing in am and then another 2 puffs about 12 hours later.  GERD diet   07/02/2012 f/u ov/Brian Dunn cc marked improvement  >no sob rec Continue dulera 100 2 every 12 hours but work maintaining  perfect inhaler technique:    Reduce bisoprolol 5 mg one half daily and let Dr Jeanie Sewer do your follow up It is ok to substitute the olmesartan with generic diovan=valsartan but not an ace inhibitor    09/30/2012 f/u ov/Brian Dunn cc sob bettter, main problem cough x one month on losartan instead of diovan due to cost,  Cough is dry, assoc with ant watery nasal discharge worse early in am on flonase qam  Only.  No obvious daytime variabilty or assoc sobh or cp or chest tightness, subjective wheeze overt sinus or hb symptoms. No unusual exp hx     Sleeping ok without nocturnal  or early am exacerbation  of respiratory  c/o's or need for noct saba. Also denies any obvious fluctuation of symptoms with weather or environmental changes or other aggravating or alleviating factors except as outlined above   ROS  The following are not active complaints unless bolded sore throat, dysphagia, dental problems, itching, sneezing,  nasal congestion or excess/ purulent secretions, ear ache,   fever, chills, sweats, unintended wt loss, pleuritic or exertional cp, hemoptysis,  orthopnea pnd or leg swelling, presyncope, palpitations, heartburn, abdominal pain, anorexia, nausea, vomiting, diarrhea  or change in bowel or urinary habits, change in stools or urine, dysuria,hematuria,  rash, arthralgias, visual complaints, headache, numbness weakness or ataxia or problems with walking or coordination,  change in mood/affect or memory.             Past Medical History: COPD (ICD-496)     - HFA 75% p coaching 01/22/2011      - PFT's 02/04/2011 very minimal airflow obst   HYPOGONADISM (ICD-257.2) TREMOR, ESSENTIAL (ICD-333.1) CHEST PAIN (ICD-786.50) HIP FRACTURE (ICD-820.8) OSTEOPENIA (ICD-733.90) ALLERGIC RHINITIS (ICD-477.9) GERD (ICD-530.81) COUGH     - Sinus CT 01/24/11 There is mucosal thickening within the paranasal sinuses without  air-fluid levels. Neither ostiomeatal unit is patent. HX, PERSONAL, MALIGNANCY, PROSTATE  (ICD-V10.46) HYPERTENSION (ICD-401.9)      - Try off ace and coreg October 18, 2010 > much better p  resumed acei              Objective:   Physical Exam     amb min hoarse wm nad  wt 173 October 18, 2010  >   174 02/04/2011 >  > 06/18/2012 175 > 07/02/2012  174> 09/30/2012  185 HEENT mild turbinate edema.  Oropharynx no thrush or excess pnd or cobblestoning.  No JVD or cervical adenopathy. Mild accessory muscle hypertrophy. Trachea midline, nl thryroid. Chest was hyperinflated by percussion with diminished breath sounds and moderate increased exp time  With  new sonorous exp > insp bilateral wheeze. Hoover sign positive at mid inspiration. Regular rate and rhythm without murmur gallop or rub or increase P2 or edema.  Abd: no hsm, nl excursion. Ext warm without cyanosis or clubbing.   Assessment & Plan:

## 2012-10-01 NOTE — Assessment & Plan Note (Addendum)
Not clear whether choice of arb makes any difference but will give samples of tribenzor 40-5-12.5  for now to sort this out and if cough resolves refer back to Dr Jeanie Sewer for f/u - if cough not better should return here.

## 2012-10-01 NOTE — Assessment & Plan Note (Signed)
-  HFA 75% p coaching 01/22/2011  -PFT's  02/04/2011 minimal airflow obst, nl dlco  Not clear at all he has an asthmatic component so should be able to just change dulera to prn use and see if symptoms worsen and if so stronger indication to avoid non-specific beta blocker in favor of bystolic or bisoprolol.

## 2012-10-01 NOTE — Assessment & Plan Note (Addendum)
-   Trial off acei again  03/25/2012    - Sinus CT 01/24/11 There is mucosal thickening within the paranasal sinuses without  air-fluid levels. Neither ostiomeatal unit is patent  This is clearly  Classic Upper airway cough syndrome, so named because it's frequently impossible to sort out how much is  CR/sinusitis with freq throat clearing (which can be related to primary GERD)   vs  causing  secondary (" extra esophageal")  GERD from wide swings in gastric pressure that occur with throat clearing, often  promoting self use of mint and menthol lozenges that reduce the lower esophageal sphincter tone and exacerbate the problem further in a cyclical fashion.   These are the same pts (now being labeled as having "irritable larynx syndrome" by some cough centers) who not infrequently have a history of having failed to tolerate ace inhibitors,  dry powder inhalers or biphosphonates or report having atypical reflux symptoms that don't respond to standard doses of PPI , and are easily confused as having aecopd or asthma flares by even experienced allergists/ pulmonologists.   First step is to optimize rx of chronic rhinitis/ pnds  The other issue is whether generic cozaar contributing to cough (anecdotally being reported but unclear cause and effect) Try off again (see hbp)

## 2012-11-30 ENCOUNTER — Encounter: Payer: Self-pay | Admitting: Gastroenterology

## 2012-12-02 ENCOUNTER — Telehealth: Payer: Self-pay | Admitting: Gastroenterology

## 2012-12-02 NOTE — Telephone Encounter (Signed)
Patient transferred care.

## 2013-09-09 DIAGNOSIS — M545 Low back pain, unspecified: Secondary | ICD-10-CM

## 2013-09-09 HISTORY — DX: Low back pain, unspecified: M54.50

## 2013-09-10 ENCOUNTER — Other Ambulatory Visit: Payer: Self-pay | Admitting: Orthopedic Surgery

## 2013-09-10 DIAGNOSIS — M545 Low back pain, unspecified: Secondary | ICD-10-CM

## 2013-09-13 ENCOUNTER — Other Ambulatory Visit: Payer: Self-pay | Admitting: Internal Medicine

## 2013-09-16 ENCOUNTER — Telehealth: Payer: Self-pay | Admitting: Internal Medicine

## 2013-09-16 MED ORDER — MOMETASONE FURO-FORMOTEROL FUM 100-5 MCG/ACT IN AERO: 2.0000 | INHALATION_SPRAY | Freq: Two times a day (BID) | RESPIRATORY_TRACT | Status: DC

## 2013-09-16 NOTE — Telephone Encounter (Signed)
Spoke with the pt  He states that he is needing sample of dulera  His PCP does not want to refill inhaled meds  I advised in this case will need a f/u here  Ov scheduled for 10/04/13 1 box of dulera was left up front for pick up

## 2013-09-17 ENCOUNTER — Other Ambulatory Visit: Payer: Medicare Other

## 2013-09-22 ENCOUNTER — Other Ambulatory Visit: Payer: Medicare Other

## 2013-09-23 ENCOUNTER — Other Ambulatory Visit: Payer: Medicare Other

## 2013-10-04 ENCOUNTER — Ambulatory Visit (INDEPENDENT_AMBULATORY_CARE_PROVIDER_SITE_OTHER): Payer: Medicare Other | Admitting: Internal Medicine

## 2013-10-04 ENCOUNTER — Encounter: Payer: Self-pay | Admitting: Internal Medicine

## 2013-10-04 ENCOUNTER — Ambulatory Visit
Admission: RE | Admit: 2013-10-04 | Discharge: 2013-10-04 | Disposition: A | Discharge status: Home / Self Care | Payer: Medicare Other | Source: Ambulatory Visit | Attending: Orthopedic Surgery | Admitting: Orthopedic Surgery

## 2013-10-04 VITALS — BP 100/58 | HR 69 | Temp 98.3°F | Ht 70.5 in | Wt 182.0 lb

## 2013-10-04 DIAGNOSIS — M545 Low back pain, unspecified: Secondary | ICD-10-CM

## 2013-10-04 DIAGNOSIS — I10 Essential (primary) hypertension: Secondary | ICD-10-CM

## 2013-10-04 DIAGNOSIS — J45991 Cough variant asthma: Secondary | ICD-10-CM

## 2013-10-04 MED ORDER — MOMETASONE FURO-FORMOTEROL FUM 200-5 MCG/ACT IN AERO: INHALATION_SPRAY | RESPIRATORY_TRACT | Status: DC

## 2013-10-04 NOTE — Progress Notes (Signed)
Subjective:     Patient ID: Brian Dunn, male    DOB: 1941-07-21    MRN: [id number]   Brief patient profile:  71  yowm quit smoking in 2000 with no respiratory problems at all at that point and PFT's documented  minimal airflow obst  02/04/2011    History of Present Illness  October 18, 2010  1st pulmonary office eval for recurrent "pna" x 2 years  = rattling cough/ fatigue/ nasty mucus and difficulty breathing to point of needing recliner requiring prolonged abx improved but not completely better from July thru  November 2011  but on day of ov completely better x for fatigue and cough when eat and still requires mucinex assoc with intermittent hb and nasal congestion but no purulent secretions.  rec Stop flovent/ crevidol/ fish oil/ finsopril  Benicar 40 mg one daily bystolic 20 mg one daily     December 07, 2010       - Confused with meds 02/04/2011 ,  Try off coreg again and rec continue bisoprolol  01/22/2011  Ov much worse cough despite off ace, on bystolic and on ppi.  Esp severe at hs. No purulent sputum or overt hb  Rec CT sinus > neg.  rec Prilosec before breakfast and pepcid 20 mg one at bedtime  Mucinex dm is take 2 every 12 hours if needed for cough or congestion Dulera 200 Take 2 puffs first thing in am and then another 2 puffs about 12 hours later > much better    06/18/2012 f/u ov/Betty Daidone back on prilosec 20 mg q am ac x 2 weeks with recurrent cough x 05/22/12 no better p  Proair(though note poor technique - see below) , did take z pack, seems worse at hs, mucus is yellow esp in am, assoc with nasal congestion but no overt HB and worseing doe as well rec Prednisone 10 mg take  4 each am x 2 days,   2 each am x 2 days,  1 each am x2days and stop  Levaquin 750 mg one daily x 5 days Pepcid 20 mg po at bedtime Dulera 100 Take 2 puffs first thing in am and then another 2 puffs about 12 hours later.  GERD diet   07/02/2012 f/u ov/Fredrico Beedle cc marked improvement  >no sob rec Continue  dulera 100 2 every 12 hours but work maintaining  perfect inhaler technique:  Reduce bisoprolol 5 mg one half daily and let Dr Jeanie Sewer do your follow up It is ok to substitute the olmesartan with generic diovan=valsartan but not an ace inhibitor    09/30/2012 f/u ov/Korvin Valentine cc sob bettter, main problem cough x one month on losartan instead of diovan due to cost,  Cough is dry, assoc with ant watery nasal discharge worse early in am on flonase qam  Only.  No obvious daytime variabilty or assoc sobh or cp or chest tightness, subjective wheeze overt sinus or hb symptoms. No unusual exp hx  rec Ok to change dulera to where you only only take it every 12 hours if needed for breathing Change flonase to one spray in am and one in pm to see if helps the nasal drainage and if not option is dymista twice daily (but not avail in generic) Stop losartan, amlodipine and hctz and take tribenzor 40-5-12.5 and return to Dr Jeanie Sewer for further blood pressure treatment if cough goes away    10/04/2013 f/u ov/Chole Driver re:  ? Cough variant asthma  Chief Complaint  Patient  presents with  . Follow-up    Breathing is unchanged since last OV.  No concerns today  Cough lasts from Nov - April yearly x 3-4 years typically worse in am prod of min mucoid sputum Only takes dulera 100 2 puffs each am     No obvious day to day or daytime variabilty or assoc sob or cp or chest tightness, subjective wheeze overt sinus or hb symptoms. No unusual exp hx or h/o childhood pna/ asthma or knowledge of premature birth.  Sleeping ok without nocturnal  or early am exacerbation  of respiratory  c/o's or need for noct saba. Also denies any obvious fluctuation of symptoms with weather or environmental changes or other aggravating or alleviating factors except as outlined above   Current Medications, Allergies, Complete Past Medical History, Past Surgical History, Family History, and Social History were reviewed in [employer] record.  ROS  The following are not active complaints unless bolded sore throat, dysphagia, dental problems, itching, sneezing,  nasal congestion or excess/ purulent secretions, ear ache,   fever, chills, sweats, unintended wt loss, pleuritic or exertional cp, hemoptysis,  orthopnea pnd or leg swelling, presyncope, palpitations, heartburn, abdominal pain, anorexia, nausea, vomiting, diarrhea  or change in bowel or urinary habits, change in stools or urine, dysuria,hematuria,  rash, arthralgias, visual complaints, headache, numbness weakness or ataxia or problems with walking or coordination,  change in mood/affect or memory.              Past Medical History: COPD (ICD-496)     - HFA 75% p coaching 01/22/2011      - PFT's 02/04/2011 very minimal airflow obst   HYPOGONADISM (ICD-257.2) TREMOR, ESSENTIAL (ICD-333.1) CHEST PAIN (ICD-786.50) HIP FRACTURE (ICD-820.8) OSTEOPENIA (ICD-733.90) ALLERGIC RHINITIS (ICD-477.9) GERD (ICD-530.81) COUGH     - Sinus CT 01/24/11 There is mucosal thickening within the paranasal sinuses without  air-fluid levels. Neither ostiomeatal unit is patent. HX, PERSONAL, MALIGNANCY, PROSTATE (ICD-V10.46) HYPERTENSION (ICD-401.9)      - Try off ace and coreg October 18, 2010 > much better p  resumed acei              Objective:   Physical Exam     amb min hoarse wm nad  wt 173 October 18, 2010  >   174 02/04/2011 >  > 06/18/2012 175 > 07/02/2012  174> 09/30/2012  185> 10/04/2013  182  HEENT: nl dentition, turbinates, and orophanx. Nl external ear canals without cough reflex   NECK :  without JVD/Nodes/TM/ nl carotid upstrokes bilaterally   LUNGS: no acc muscle use, clear to A and P bilaterally without cough on insp or exp maneuvers   CV:  RRR  no s3 or murmur or increase in P2, no edema   ABD:  soft and nontender with nl excursion in the supine position. No bruits or organomegaly, bowel sounds nl  MS:  warm without deformities, calf  tenderness, cyanosis or clubbing  SKIN: warm and dry without lesions    NEURO:  alert, approp, no deficits     Assessment & Plan:

## 2013-10-04 NOTE — Patient Instructions (Signed)
Increase dulera 200 2 each am to see if you notice any difference in the cough - even increase to 2 puffs every 12 hours if the morning cough is an issue   The best substitute on your formulary for the benicar is irbesartan 300 mg daily but your blood pressure is so low now so I'm reluctant to change it without Dr Marnee Guarneri input   If you are satisfied with your treatment plan let your doctor know and he/she can either refill your medications or you can return here when your prescription runs out.     If in any way you are not 100% satisfied,  please tell us.  If 100% better, tell your friends!

## 2013-10-05 NOTE — Assessment & Plan Note (Addendum)
Not clear whether the am cough represents fasthma but should try full dose dulera 200 dosed 2 bid  before considering options  The proper method of use, as well as anticipated side effects, of a metered-dose inhaler are discussed and demonstrated to the patient. Improved effectiveness after extensive coaching during this visit to a level of approximately  75%     Each maintenance medication was reviewed in detail including most importantly the difference between maintenance and as needed and under what circumstances the prns are to be used.  Please see instructions for details which were reviewed in writing and the patient given a copy.

## 2013-10-05 NOTE — Assessment & Plan Note (Signed)
Pt brought formulary indicating cheaper option arb = avapro 150 = irbesartan but defer final choice to Dr Jeanie Sewer

## 2013-11-01 ENCOUNTER — Other Ambulatory Visit: Payer: Self-pay | Admitting: Internal Medicine

## 2013-11-09 ENCOUNTER — Telehealth: Payer: Self-pay | Admitting: Internal Medicine

## 2013-11-09 MED ORDER — BISOPROLOL FUMARATE 5 MG PO TABS: 5.0000 mg | ORAL_TABLET | Freq: Every day | ORAL | Status: DC

## 2013-11-09 NOTE — Telephone Encounter (Signed)
Called and spoke with pt and he stated that medication that he needs filled is the bisoprolol and not  lexapro.  Pt is needing this rx sent in to [facility].  MW are you ok to send this medication in for the pt?  Please advise. Thanks  Allergies  Allergen Reactions  . Morphine     REACTION: rash  . Penicillins     REACTION: swelling     Current Outpatient Prescriptions on File Prior to Visit  Medication Sig Dispense Refill  . albuterol (PROAIR HFA) 108 (90 BASE) MCG/ACT inhaler Inhale 2 puffs into the lungs every 6 (six) hours as needed for wheezing.  1 Inhaler  3  . allopurinol (ZYLOPRIM) 100 MG tablet 1 tablet daily.      Marland Kitchen aspirin 81 MG tablet Take 81 mg by mouth daily.      . Calcium Carbonate-Vitamin D (CALCIUM 600 + D PO) Take by mouth daily.      . Coenzyme Q10 (CO Q-10) 75 MG CAPS Take 120 mg by mouth daily.       . fluticasone (FLONASE) 50 MCG/ACT nasal spray 2 sprays by Nasal route daily.      . Magnesium 400 MG CAPS Take by mouth. 3 tabs daily      . mometasone-formoterol (DULERA) 200-5 MCG/ACT AERO Take 2 puffs first thing in am and then another 2 puffs about 12 hours later.  1 Inhaler  11  . Multiple Vitamin (MULTIVITAMIN) capsule Take 1 capsule by mouth daily.        . Tamsulosin HCl (FLOMAX) 0.4 MG CAPS Take 0.4 mg by mouth as needed.       Marland Kitchen UNABLE TO FIND 150 mcg daily. Med Name: sea kelp      . vitamin B-12 (CYANOCOBALAMIN) 500 MCG tablet Take 500 mcg by mouth daily.         No current facility-administered medications on file prior to visit.

## 2013-11-09 NOTE — Telephone Encounter (Signed)
Refill of the bisoprolol 5 mg has been sent to the mail order per pts request.

## 2013-11-09 NOTE — Telephone Encounter (Signed)
Yes fine with me 

## 2014-02-09 ENCOUNTER — Telehealth: Payer: Self-pay | Admitting: Internal Medicine

## 2014-02-09 MED ORDER — MOMETASONE FURO-FORMOTEROL FUM 200-5 MCG/ACT IN AERO: 2.0000 | INHALATION_SPRAY | Freq: Two times a day (BID) | RESPIRATORY_TRACT | Status: AC

## 2014-02-09 MED ORDER — MOMETASONE FURO-FORMOTEROL FUM 200-5 MCG/ACT IN AERO: INHALATION_SPRAY | RESPIRATORY_TRACT | Status: DC

## 2014-02-09 NOTE — Telephone Encounter (Signed)
Per pt's chart, Dulera was changed from 100 to 200 at the 12.1.14 and rx was printed for #1 inhaler with 11 refills  Called [facility] in [place] and spoke with pharmacist Almyra Free who stated they have no other rx's on file for pt other than the last electronic rx from 09/2013.  Went ahead and gave verbal authorization for the [facility] 200-49mcg #1 inhaler with 11 additional refills.  Almyra Free verbalized her understanding.  Called spoke with patient and apologized for any inconvenience.  Pt does remember MW printing off the script from last ov and stated that he did hand this to the pharmacist.  Advised pt that rx was called in and that we do samples available for him.  Pt verbalized his understanding and stated nothing further needed.  Med list updated Sample documented per protocol Nothing further needed; will sign off.

## 2014-06-23 ENCOUNTER — Telehealth: Payer: Self-pay | Admitting: Internal Medicine

## 2014-06-23 MED ORDER — MOMETASONE FURO-FORMOTEROL FUM 200-5 MCG/ACT IN AERO: INHALATION_SPRAY | RESPIRATORY_TRACT | Status: DC

## 2014-06-23 NOTE — Telephone Encounter (Signed)
Called and spoke with pt and he is aware of samples that have been left up front and he will stop by tomorrow to pick these up.  Nothing further is needed.

## 2014-07-02 ENCOUNTER — Encounter: Payer: Self-pay | Admitting: Gastroenterology

## 2014-10-24 ENCOUNTER — Other Ambulatory Visit: Payer: Self-pay | Admitting: Internal Medicine

## 2014-10-31 ENCOUNTER — Telehealth: Payer: Self-pay | Admitting: Internal Medicine

## 2014-10-31 MED ORDER — BISOPROLOL FUMARATE 5 MG PO TABS: 5.0000 mg | ORAL_TABLET | Freq: Every day | ORAL | Status: DC

## 2014-10-31 NOTE — Telephone Encounter (Signed)
Called and spoke with pt and he has a pending appt with MW in 11/2014.  He is aware to keep this appt.  Refill of the zebeta has been sent to his mail order pharmacy with no refills.

## 2014-11-09 ENCOUNTER — Encounter: Payer: Self-pay | Admitting: Internal Medicine

## 2014-11-09 ENCOUNTER — Ambulatory Visit (INDEPENDENT_AMBULATORY_CARE_PROVIDER_SITE_OTHER): Payer: PPO | Admitting: Internal Medicine

## 2014-11-09 ENCOUNTER — Encounter (INDEPENDENT_AMBULATORY_CARE_PROVIDER_SITE_OTHER): Payer: Self-pay

## 2014-11-09 VITALS — BP 124/72 | HR 60 | Temp 98.6°F | Ht 70.5 in | Wt 186.0 lb

## 2014-11-09 DIAGNOSIS — J45991 Cough variant asthma: Secondary | ICD-10-CM

## 2014-11-09 NOTE — Progress Notes (Addendum)
Subjective:     Patient ID: Brian Dunn, male    DOB: 05-29-1941    MRN: [id number]   Brief patient profile:  56  yowm quit smoking in 2000 with no respiratory problems at all at that point and PFT's documented  minimal airflow obst  02/04/2011    History of Present Illness  October 18, 2010  1st pulmonary office eval for recurrent "pna" x 2 years  = rattling cough/ fatigue/ nasty mucus and difficulty breathing to point of needing recliner requiring prolonged abx improved but not completely better from July thru  November 2011  but on day of ov completely better x for fatigue and cough when eat and still requires mucinex assoc with intermittent hb and nasal congestion but no purulent secretions.  rec Stop flovent/ crevidol/ fish oil/ finsopril  Benicar 40 mg one daily bystolic 20 mg one daily     December 07, 2010       - Confused with meds 02/04/2011 ,  Try off coreg again and rec continue bisoprolol  01/22/2011  Ov much worse cough despite off ace, on bystolic and on ppi.  Esp severe at hs. No purulent sputum or overt hb  Rec CT sinus > neg.  rec Prilosec before breakfast and pepcid 20 mg one at bedtime  Mucinex dm is take 2 every 12 hours if needed for cough or congestion Dulera 200 Take 2 puffs first thing in am and then another 2 puffs about 12 hours later > much better    07/02/2012 f/u ov/Brian Dunn cc marked improvement  >no sob rec Continue dulera 100 2 every 12 hours but work maintaining  perfect inhaler technique:  Reduce bisoprolol 5 mg one half daily and let Brian Lin Landsman do your follow up It is ok to substitute the olmesartan with generic diovan=valsartan but not an ace inhibitor    10/04/2013 f/u ov/Brian Dunn re:  ? Cough variant asthma  Chief Complaint  Patient presents with  . Follow-up    Breathing is unchanged since last OV.  No concerns today  Cough lasts from Nov - April yearly x last 3-4 years typically worse in am prod of min mucoid sputum Only takes dulera 100 2 puffs each  am  rec Increase dulera 200 2 each am to see if you notice any difference in the cough - even increase to 2 puffs every 12 hours if the morning cough is an issue  The best substitute on your formulary for the benicar is irbesartan 300 mg daily but your blood pressure is so low now so I'm reluctant to change it without Brian Dunn input   1/62016 f/u ov/Brian Dunn re: here for refills/doing fine     No flare of  cough  sob or cp or chest tightness, subjective wheeze overt sinus or hb symptoms. No unusual exp hx or h/o childhood pna/ asthma or knowledge of premature birth.  Sleeping ok without nocturnal  or early am exacerbation  of respiratory  c/o's or need for noct saba. Also denies any obvious fluctuation of symptoms with weather or environmental changes or other aggravating or alleviating factors except as outlined above   Current Medications, Allergies, Complete Past Medical History, Past Surgical History, Family History, and Social History were reviewed in [employer] record.  ROS  The following are not active complaints unless bolded sore throat, dysphagia, dental problems, itching, sneezing,  nasal congestion or excess/ purulent secretions, ear ache,   fever, chills, sweats, unintended wt loss,  pleuritic or exertional cp, hemoptysis,  orthopnea pnd or leg swelling, presyncope, palpitations, heartburn, abdominal pain, anorexia, nausea, vomiting, diarrhea  or change in bowel or urinary habits, change in stools or urine, dysuria,hematuria,  rash, arthralgias, visual complaints, headache, numbness weakness or ataxia or problems with walking or coordination,  change in mood/affect or memory.              Past Medical History: COPD (ICD-496)     - HFA 75% p coaching 01/22/2011      - PFT's 02/04/2011 very minimal airflow obst   HYPOGONADISM (ICD-257.2) TREMOR, ESSENTIAL (ICD-333.1) CHEST PAIN (ICD-786.50) HIP FRACTURE (ICD-820.8) OSTEOPENIA (ICD-733.90) ALLERGIC  RHINITIS (ICD-477.9) GERD (ICD-530.81) COUGH     - Sinus CT 01/24/11 There is mucosal thickening within the paranasal sinuses without  air-fluid levels. Neither ostiomeatal unit is patent. HX, PERSONAL, MALIGNANCY, PROSTATE (ICD-V10.46) HYPERTENSION (ICD-401.9)      - Try off ace and coreg October 18, 2010 > much better p  resumed acei              Objective:   Physical Exam     amb min hoarse wm nad   wt 173 October 18, 2010  >   174 02/04/2011 >  > 06/18/2012 175 > 07/02/2012  174> 09/30/2012  185> 10/04/2013  182 > 11/09/2014  187   HEENT: nl dentition, turbinates, and orophanx. Nl external ear canals without cough reflex   NECK :  without JVD/Nodes/TM/ nl carotid upstrokes bilaterally   LUNGS: no acc muscle use, clear to A and P bilaterally without cough on insp or exp maneuvers   CV:  RRR  no s3 or murmur or increase in P2, no edema   ABD:  soft and nontender with nl excursion in the supine position. No bruits or organomegaly, bowel sounds nl  MS:  warm without deformities, calf tenderness, cyanosis or clubbing  SKIN: warm and dry without lesions    NEURO:  alert, approp, no deficits        Assessment & Plan:   Outpatient Encounter Prescriptions as of 11/09/2014  Medication Sig  . albuterol (PROAIR HFA) 108 (90 BASE) MCG/ACT inhaler Inhale 2 puffs into the lungs every 6 (six) hours as needed for wheezing.  Marland Kitchen allopurinol (ZYLOPRIM) 100 MG tablet 1 tablet daily.  Marland Kitchen amLODipine (NORVASC) 5 MG tablet Take 5 mg by mouth daily.  Marland Kitchen aspirin 81 MG tablet Take 81 mg by mouth daily.  . bisoprolol (ZEBETA) 5 MG tablet Take 1 tablet (5 mg total) by mouth daily.  . Calcium Carbonate-Vitamin D (CALCIUM 600 + D PO) Take by mouth daily.  . Coenzyme Q10 (CO Q-10) 75 MG CAPS Take 120 mg by mouth daily.   . fluticasone (FLONASE) 50 MCG/ACT nasal spray 2 sprays by Nasal route daily.  . hydrochlorothiazide (MICROZIDE) 12.5 MG capsule Take 12.5 mg by mouth daily.  . Magnesium 400 MG  CAPS Take by mouth. 3 tabs daily  . mometasone-formoterol (DULERA) 200-5 MCG/ACT AERO Take 2 puffs first thing in am and then another 2 puffs about 12 hours later.  . Multiple Vitamin (MULTIVITAMIN) capsule Take 1 capsule by mouth daily.    . Tamsulosin HCl (FLOMAX) 0.4 MG CAPS Take 0.4 mg by mouth as needed.   Marland Kitchen UNABLE TO FIND 150 mcg daily. Med Name: sea kelp  . vitamin B-12 (CYANOCOBALAMIN) 500 MCG tablet Take 500 mcg by mouth daily.

## 2014-11-09 NOTE — Patient Instructions (Signed)
Continue the dulera 200 2 each am and add the pm dose if any flare of cough/ wheeze/shortness of breath   If you are satisfied with your treatment plan,  let your doctor know and he/she can either refill your medications or you can return here when your prescription runs out.     If in any way you are not 100% satisfied,  please tell us.  If 100% better, tell your friends!  Pulmonary follow up is as needed

## 2014-11-26 ENCOUNTER — Encounter: Payer: Self-pay | Admitting: Internal Medicine

## 2014-11-26 NOTE — Assessment & Plan Note (Signed)
-  HFA 75% p coaching 01/22/2011  -PFT's  02/04/2011 minimal airflow obst, nl dlc  Doing fine on duler 200 2 each am with option to increase to 2 bid for any flare and good control vs years prior since he's been on the dulera and off acei/ non-speicific BB so no pulmonary f/u needed at this point  See instructions for specific recommendations which were reviewed directly with the patient who was given a copy with highlighter outlining the key components.

## 2014-12-02 ENCOUNTER — Telehealth: Payer: Self-pay | Admitting: Internal Medicine

## 2014-12-02 NOTE — Telephone Encounter (Signed)
Called and spoke with pt and he is aware that we will get his PA done this afternoon for the dulera.  Will forward to [employer]

## 2014-12-02 NOTE — Telephone Encounter (Signed)
Formulary exception filled out, signed by MW, and faxed.  Will forward to [place] to follow up on.

## 2014-12-06 MED ORDER — MOMETASONE FURO-FORMOTEROL FUM 200-5 MCG/ACT IN AERO
INHALATION_SPRAY | RESPIRATORY_TRACT | Status: DC
Start: 2014-12-06 — End: 2014-12-12

## 2014-12-06 NOTE — Telephone Encounter (Signed)
Received letter from ins that Ruthe Mannan has been denied  They did not list any covered alternatives Dr Melvyn Novas, please advise thanks!

## 2014-12-06 NOTE — Telephone Encounter (Signed)
Spoke with pt and advised of Dr Gustavus Bryant recommendations.  Sample left at front desk and pt will call back to schedule appt once he receives formulary and before he runs out of sample.

## 2014-12-06 NOTE — Telephone Encounter (Signed)
Sample and ov with formulary before it runs out to see me or Tammy NP

## 2014-12-06 NOTE — Telephone Encounter (Signed)
Have we received anything back on this? Thanks.

## 2014-12-09 ENCOUNTER — Telehealth: Payer: Self-pay | Admitting: Internal Medicine

## 2014-12-09 NOTE — Telephone Encounter (Signed)
Pt states that he changed insurance companies 2016- Ruthe Mannan is not covered by insurance any longer - non-formulary. Pt requested an exception from his insurance company, pt states that his insurance sent multiple forms to Dr Melvyn Novas to sign. Pt states that the he was advised that we need to contact insurance company to request coverage. Pt states that he is going to pick up a sample on Monday of Dulera to cover him until we figure out insurance.  (P) [phone] (F) [phone]  Initiated PA/Exception request for [facility] coverage on [email or web address] Key: [id number] Coverage determination will be sent via fax or telephone within the next 5 business day. Can call [phone] to follow up and check on approval status.  Will send to [place] to follow up on.

## 2014-12-12 MED ORDER — MOMETASONE FURO-FORMOTEROL FUM 200-5 MCG/ACT IN AERO: INHALATION_SPRAY | RESPIRATORY_TRACT | Status: DC

## 2014-12-12 NOTE — Telephone Encounter (Signed)
Received letter from Ins stating that the Ruthe Mannan was approved from 12/10/13 until 11/04/15  Pt aware  Rx was sent to pharm  Nothing further needed

## 2014-12-12 NOTE — Telephone Encounter (Signed)
lmomtcb for pt Which pharm would pt like dulera rx sent to?

## 2014-12-12 NOTE — Telephone Encounter (Signed)
Pt states he has heard from insurance company. They will approve the dulera. Please send a copy of his rx to his pharmacy. Pt states we have all the other information that we would need.  CB at [phone]

## 2015-06-20 ENCOUNTER — Telehealth: Payer: Self-pay | Admitting: Internal Medicine

## 2015-06-20 MED ORDER — MOMETASONE FURO-FORMOTEROL FUM 200-5 MCG/ACT IN AERO: 2.0000 | INHALATION_SPRAY | Freq: Two times a day (BID) | RESPIRATORY_TRACT | Status: AC

## 2015-06-20 NOTE — Telephone Encounter (Signed)
Pt informed sample placed up front for pickup. Nothing further needed.

## 2015-07-25 ENCOUNTER — Other Ambulatory Visit: Payer: Self-pay

## 2015-11-20 ENCOUNTER — Telehealth: Payer: Self-pay | Admitting: Internal Medicine

## 2015-11-20 DIAGNOSIS — R918 Other nonspecific abnormal finding of lung field: Secondary | ICD-10-CM

## 2015-11-20 NOTE — Telephone Encounter (Signed)
Spoke with pt. He is referring to at [place] he done at [facility]. States that [place] was supposed to send this to [place]. Advised him that we do not have this report. He states that he is going to call them and have them fax it over. Will await fax.

## 2015-11-21 ENCOUNTER — Encounter: Payer: Self-pay | Admitting: Internal Medicine

## 2015-11-21 DIAGNOSIS — R918 Other nonspecific abnormal finding of lung field: Secondary | ICD-10-CM | POA: Insufficient documentation

## 2015-11-21 HISTORY — DX: Other nonspecific abnormal finding of lung field: R91.8

## 2015-11-21 NOTE — Telephone Encounter (Signed)
CT from [facility] should be accessible through [facility] (NOT in [place], will have to use big computer on A side to access). MW please advise on CT results.  Thanks.

## 2015-11-21 NOTE — Telephone Encounter (Signed)
Correction 6 months is fine > already has plans to do so

## 2015-11-21 NOTE — Telephone Encounter (Signed)
Reviewed - all the nodules are tiny and probably benign   rec 12 m f/u as this is low risk and placed in tickle for recall 10/18/16

## 2015-11-21 NOTE — Telephone Encounter (Signed)
Spoke with pt, aware of ct results.  Also aware that we have no dulera 200 samples in closet at this time.  No CT chest ordered in pt chart, order placed for follow up ct in 6 months.  Nothing further needed.

## 2015-11-27 DIAGNOSIS — I10 Essential (primary) hypertension: Secondary | ICD-10-CM | POA: Diagnosis not present

## 2015-11-27 DIAGNOSIS — E782 Mixed hyperlipidemia: Secondary | ICD-10-CM | POA: Diagnosis not present

## 2015-11-27 DIAGNOSIS — I2510 Atherosclerotic heart disease of native coronary artery without angina pectoris: Secondary | ICD-10-CM | POA: Diagnosis not present

## 2016-01-04 DIAGNOSIS — B9689 Other specified bacterial agents as the cause of diseases classified elsewhere: Secondary | ICD-10-CM | POA: Diagnosis not present

## 2016-01-04 DIAGNOSIS — J019 Acute sinusitis, unspecified: Secondary | ICD-10-CM | POA: Diagnosis not present

## 2016-05-31 DIAGNOSIS — K4090 Unilateral inguinal hernia, without obstruction or gangrene, not specified as recurrent: Secondary | ICD-10-CM | POA: Diagnosis not present

## 2016-05-31 DIAGNOSIS — M1990 Unspecified osteoarthritis, unspecified site: Secondary | ICD-10-CM | POA: Diagnosis not present

## 2016-05-31 DIAGNOSIS — Z0000 Encounter for general adult medical examination without abnormal findings: Secondary | ICD-10-CM | POA: Diagnosis not present

## 2016-05-31 DIAGNOSIS — Z9181 History of falling: Secondary | ICD-10-CM | POA: Diagnosis not present

## 2016-05-31 DIAGNOSIS — Z1389 Encounter for screening for other disorder: Secondary | ICD-10-CM | POA: Diagnosis not present

## 2016-05-31 DIAGNOSIS — J302 Other seasonal allergic rhinitis: Secondary | ICD-10-CM | POA: Diagnosis not present

## 2016-06-04 DIAGNOSIS — I700 Atherosclerosis of aorta: Secondary | ICD-10-CM | POA: Diagnosis not present

## 2016-06-04 DIAGNOSIS — R918 Other nonspecific abnormal finding of lung field: Secondary | ICD-10-CM | POA: Diagnosis not present

## 2016-06-04 DIAGNOSIS — I2510 Atherosclerotic heart disease of native coronary artery without angina pectoris: Secondary | ICD-10-CM | POA: Diagnosis not present

## 2016-06-04 DIAGNOSIS — M47894 Other spondylosis, thoracic region: Secondary | ICD-10-CM | POA: Diagnosis not present

## 2016-06-04 DIAGNOSIS — C61 Malignant neoplasm of prostate: Secondary | ICD-10-CM | POA: Diagnosis not present

## 2016-06-04 DIAGNOSIS — M8580 Other specified disorders of bone density and structure, unspecified site: Secondary | ICD-10-CM | POA: Diagnosis not present

## 2016-06-10 ENCOUNTER — Telehealth: Payer: Self-pay | Admitting: Internal Medicine

## 2016-06-10 NOTE — Telephone Encounter (Signed)
Spoke with pt and he states he had CT scan done at [facility] on 06/04/16 ordered by MW. He is wanting results. I do not see any result notes in chart.  MW - Please advise if you have seen these results. Thanks!  LOV  11/09/14 Instructions   Patient Instructions    Continue the dulera 200 2 each am and add the pm dose if any flare of cough/ wheeze/shortness of breath   If you are satisfied with your treatment plan,  let your doctor know and he/she can either refill your medications or you can return here when your prescription runs out.     If in any way you are not 100% satisfied,  please tell us.  If 100% better, tell your friends!  Pulmonary follow up is as needed

## 2016-06-10 NOTE — Telephone Encounter (Signed)
Spoke with pt and gave results and recommendations. Pt asked for report to be sent to PCP. Report faxed to Dr. Lin Landsman. Nothing further needed.

## 2016-06-10 NOTE — Telephone Encounter (Signed)
Let him know no change so radiology rec is repeat in 18 m to be complete/ placed in tickle file

## 2016-06-20 DIAGNOSIS — I2510 Atherosclerotic heart disease of native coronary artery without angina pectoris: Secondary | ICD-10-CM

## 2016-06-20 DIAGNOSIS — I10 Essential (primary) hypertension: Secondary | ICD-10-CM | POA: Insufficient documentation

## 2016-06-20 DIAGNOSIS — E785 Hyperlipidemia, unspecified: Secondary | ICD-10-CM | POA: Insufficient documentation

## 2016-06-20 DIAGNOSIS — I25119 Atherosclerotic heart disease of native coronary artery with unspecified angina pectoris: Secondary | ICD-10-CM

## 2016-06-20 DIAGNOSIS — K4020 Bilateral inguinal hernia, without obstruction or gangrene, not specified as recurrent: Secondary | ICD-10-CM

## 2016-06-20 HISTORY — DX: Atherosclerotic heart disease of native coronary artery without angina pectoris: I25.10

## 2016-06-20 HISTORY — DX: Hyperlipidemia, unspecified: E78.5

## 2016-06-20 HISTORY — DX: Atherosclerotic heart disease of native coronary artery with unspecified angina pectoris: I25.119

## 2016-06-20 HISTORY — DX: Bilateral inguinal hernia, without obstruction or gangrene, not specified as recurrent: K40.20

## 2016-06-20 HISTORY — DX: Essential (primary) hypertension: I10

## 2016-07-03 DIAGNOSIS — K4020 Bilateral inguinal hernia, without obstruction or gangrene, not specified as recurrent: Secondary | ICD-10-CM | POA: Diagnosis not present

## 2016-07-17 DIAGNOSIS — M2550 Pain in unspecified joint: Secondary | ICD-10-CM | POA: Diagnosis not present

## 2016-07-17 DIAGNOSIS — M7989 Other specified soft tissue disorders: Secondary | ICD-10-CM | POA: Diagnosis not present

## 2016-07-17 DIAGNOSIS — M1A09X0 Idiopathic chronic gout, multiple sites, without tophus (tophi): Secondary | ICD-10-CM | POA: Diagnosis not present

## 2016-07-17 DIAGNOSIS — R5382 Chronic fatigue, unspecified: Secondary | ICD-10-CM | POA: Diagnosis not present

## 2016-08-02 DIAGNOSIS — K4020 Bilateral inguinal hernia, without obstruction or gangrene, not specified as recurrent: Secondary | ICD-10-CM | POA: Diagnosis not present

## 2016-08-02 DIAGNOSIS — I2510 Atherosclerotic heart disease of native coronary artery without angina pectoris: Secondary | ICD-10-CM | POA: Diagnosis not present

## 2016-08-02 DIAGNOSIS — Z7982 Long term (current) use of aspirin: Secondary | ICD-10-CM | POA: Diagnosis not present

## 2016-08-02 DIAGNOSIS — J45909 Unspecified asthma, uncomplicated: Secondary | ICD-10-CM | POA: Diagnosis not present

## 2016-08-02 DIAGNOSIS — Z79899 Other long term (current) drug therapy: Secondary | ICD-10-CM | POA: Diagnosis not present

## 2016-08-02 DIAGNOSIS — Z01810 Encounter for preprocedural cardiovascular examination: Secondary | ICD-10-CM | POA: Diagnosis not present

## 2016-08-02 DIAGNOSIS — I10 Essential (primary) hypertension: Secondary | ICD-10-CM | POA: Diagnosis not present

## 2016-08-02 DIAGNOSIS — Z23 Encounter for immunization: Secondary | ICD-10-CM | POA: Diagnosis not present

## 2016-08-02 DIAGNOSIS — C44519 Basal cell carcinoma of skin of other part of trunk: Secondary | ICD-10-CM | POA: Diagnosis not present

## 2016-08-14 DIAGNOSIS — Z4889 Encounter for other specified surgical aftercare: Secondary | ICD-10-CM

## 2016-08-14 HISTORY — DX: Encounter for other specified surgical aftercare: Z48.89

## 2016-08-21 DIAGNOSIS — M1A09X0 Idiopathic chronic gout, multiple sites, without tophus (tophi): Secondary | ICD-10-CM | POA: Diagnosis not present

## 2016-08-21 DIAGNOSIS — M2550 Pain in unspecified joint: Secondary | ICD-10-CM | POA: Diagnosis not present

## 2016-08-21 DIAGNOSIS — R5382 Chronic fatigue, unspecified: Secondary | ICD-10-CM | POA: Diagnosis not present

## 2016-08-21 DIAGNOSIS — M7989 Other specified soft tissue disorders: Secondary | ICD-10-CM | POA: Diagnosis not present

## 2016-08-26 DIAGNOSIS — L259 Unspecified contact dermatitis, unspecified cause: Secondary | ICD-10-CM | POA: Diagnosis not present

## 2017-10-02 ENCOUNTER — Other Ambulatory Visit: Payer: Self-pay

## 2017-10-21 ENCOUNTER — Telehealth: Payer: Self-pay | Admitting: Internal Medicine

## 2017-10-21 NOTE — Telephone Encounter (Signed)
Attempted to contact pt. No answer, no option to leave a message. Will try back.  

## 2017-10-22 NOTE — Telephone Encounter (Signed)
symbicort 160 2bid is same rx as dulera 200 2bid and is the best choice if available cheaper

## 2017-10-22 NOTE — Telephone Encounter (Signed)
lmtcb x1 for pt. 

## 2017-10-22 NOTE — Telephone Encounter (Signed)
Spoke with pt. He is wanting to know if Symbicort is a suitable replacement for [facility] and Breo. Pt checked with [facility] and they have a generic for Symbicort and it would be cheaper for him. He doesn't want MW to write a prescription, his PCP will be writing the prescription. Pt just wants MW's opinion on Symbicort.  MW - please advise. Thanks.

## 2017-10-23 NOTE — Telephone Encounter (Signed)
Pt called returned phone call. Best CB number is [phone].

## 2017-10-23 NOTE — Telephone Encounter (Signed)
lmtcb x1 for pt. 

## 2017-10-23 NOTE — Telephone Encounter (Signed)
Patient returned call, CB is [phone]

## 2017-10-23 NOTE — Telephone Encounter (Signed)
Spoke with pt. He is aware of MW's response. Nothing further was needed. 

## 2017-11-06 ENCOUNTER — Encounter: Payer: Self-pay | Admitting: *Deleted

## 2017-11-06 ENCOUNTER — Ambulatory Visit (INDEPENDENT_AMBULATORY_CARE_PROVIDER_SITE_OTHER): Payer: Medicare HMO | Admitting: Cardiology

## 2017-11-06 VITALS — BP 140/84 | HR 67 | Ht 70.5 in | Wt 178.0 lb

## 2017-11-06 DIAGNOSIS — I10 Essential (primary) hypertension: Secondary | ICD-10-CM

## 2017-11-06 DIAGNOSIS — I2510 Atherosclerotic heart disease of native coronary artery without angina pectoris: Secondary | ICD-10-CM

## 2017-11-06 DIAGNOSIS — E782 Mixed hyperlipidemia: Secondary | ICD-10-CM | POA: Diagnosis not present

## 2017-11-06 HISTORY — DX: Essential (primary) hypertension: I10

## 2017-11-06 MED ORDER — NITROGLYCERIN 0.4 MG SL SUBL: 0.4000 mg | SUBLINGUAL_TABLET | SUBLINGUAL | 6 refills | Status: DC | PRN

## 2017-11-06 NOTE — Patient Instructions (Signed)
Medication Instructions:  Your physician recommends that you continue on your current medications as directed. Please refer to the Current Medication list given to you today.  Med refill for nitroglycerin has been sent  Labwork: Your physician recommends that you have the following labs drawn: CBC, TSH, BMP and liver/lipid panel  Testing/Procedures: None  Follow-Up: Your physician recommends that you schedule a follow-up appointment in: 1 year  Any Other Special Instructions Will Be Listed Below (If Applicable).     If you need a refill on your cardiac medications before your next appointment, please call your pharmacy.   [place], RN, BSN

## 2017-11-06 NOTE — Progress Notes (Signed)
Cardiology Office Note:    Date:  11/06/2017   ID:  Brian Dunn, DOB Apr 10, 1941, MRN [id number]  PCP:  Angelina Sheriff, MD  Cardiologist:  Jenean Lindau, MD   Referring MD: Angelina Sheriff, MD    ASSESSMENT:    1. Coronary artery disease involving native coronary artery of native heart without angina pectoris   2. Mixed hyperlipidemia   3. Essential hypertension    PLAN:    In order of problems listed above:  1. Secondary prevention stressed with patient.  Importance of compliance with diet and medications stressed and he vocalized understanding. 2. Prognosis excellent.  He walks a mile and a half with a regular basis.  With this he has no cardiovascular symptoms. 3. He requests nitroglycerin prescription.  Sublingual nitroglycerin prescription was sent, its protocol and 911 protocol explained and the patient vocalized understanding questions were answered to the patient's satisfaction 4. Patient will be seen in follow-up appointment in 12 months or earlier if the patient has any concerns 5.    Medication Adjustments/Labs and Tests Ordered: Current medicines are reviewed at length with the patient today.  Concerns regarding medicines are outlined above.  Orders Placed This Encounter  Procedures  . Basic metabolic panel  . CBC with Differential/Platelet  . TSH  . Hepatic function panel  . Lipid panel   Meds ordered this encounter  Medications  . nitroGLYCERIN (NITROSTAT) 0.4 MG SL tablet    Sig: Place 1 tablet (0.4 mg total) under the tongue every 5 (five) minutes as needed for chest pain.    Dispense:  11 tablet    Refill:  6     History of Present Illness:    Brian Dunn is a 77 y.o. male who is being seen today for the evaluation of coronary artery disease at the request of [place], Angelique Blonder, MD.  This patient has been under my care in my previous practice.  He is here now to transfer his care and be established with my current practice.  Patient  mentions to me that he is here for an annual visit.  He denies any chest pain orthopnea or PND.  He takes care of activities of daily living without any problem.  He mentions to me that he can walk for an hour for the significant problems.  No chest pain.  At the time of my evaluation, the patient is alert awake oriented and in no distress.  No dizzy spells or any such symptoms.  He does not take aspirin but continues to take Plavix on a regular basis.  He has been adamantly against lipid-lowering therapy including injectable medications.  Past Medical History:  Diagnosis Date  . Allergic rhinitis   . Chest pain, unspecified   . COPD (chronic obstructive pulmonary disease) ([place])   . Essential and other specified forms of tremor   . GERD (gastroesophageal reflux disease)   . Hip fracture ([place])   . Hypertension   . Hypogonadism male   . Osteopenia   . Personal history of malignant neoplasm of prostate     Past Surgical History:  Procedure Laterality Date  . CORONARY ANGIOPLASTY WITH STENT PLACEMENT  2008  . INSERTION PROSTATE RADIATION SEED      Current Medications: Current Meds  Medication Sig  . albuterol (PROAIR HFA) 108 (90 BASE) MCG/ACT inhaler Inhale 2 puffs into the lungs every 6 (six) hours as needed for wheezing.  Marland Kitchen allopurinol (ZYLOPRIM) 100 MG tablet  1 tablet daily.  . Ascorbic Acid (VITAMIN C) 1000 MG tablet Take 500 mg by mouth.  Marland Kitchen BREO ELLIPTA 100-25 MCG/INH AEPB INHALE 1 PUFF BY MOUTH DAILY  . Calcium Carbonate-Vitamin D (CALCIUM 600 + D PO) Take by mouth daily.  . celecoxib (CELEBREX) 200 MG capsule Take 200 mg by mouth 2 (two) times daily.   . clopidogrel (PLAVIX) 75 MG tablet Take 75 mg by mouth.  . Coenzyme Q10 (CO Q-10) 75 MG CAPS Take 120 mg by mouth daily.   . cyanocobalamin 50 MCG tablet Take 500 mcg by mouth.  . famotidine (PEPCID) 20 MG tablet Take 20 mg by mouth.  Lyndle Herrlich SULFATE DRIED PO Take by mouth.  . fluticasone (FLONASE) 50 MCG/ACT nasal spray 2  sprays by Nasal route daily.  . irbesartan (AVAPRO) 300 MG tablet Take 300 mg by mouth.   . loratadine (CLARITIN) 10 MG tablet Take 10 mg by mouth.  . Magnesium 400 MG CAPS Take by mouth. 3 tabs daily  . Multiple Vitamin (MULTIVITAMIN) capsule Take 1 capsule by mouth daily.    . nitroGLYCERIN (NITROSTAT) 0.4 MG SL tablet Place 1 tablet (0.4 mg total) under the tongue every 5 (five) minutes as needed for chest pain.  . primidone (MYSOLINE) 50 MG tablet Take 50 mg by mouth at bedtime.   . Tamsulosin HCl (FLOMAX) 0.4 MG CAPS Take 0.4 mg by mouth as needed.   . [DISCONTINUED] nitroGLYCERIN (NITROSTAT) 0.4 MG SL tablet Place 0.4 mg under the tongue.     Allergies:   Metoprolol; Morphine; Penicillin g; Penicillins; and Propranolol   Social History   Socioeconomic History  . Marital status: Married    Spouse name: None  . Number of children: None  . Years of education: None  . Highest education level: None  Social Needs  . Financial resource strain: None  . Food insecurity - worry: None  . Food insecurity - inability: None  . Transportation needs - medical: None  . Transportation needs - non-medical: None  Occupational History  . None  Tobacco Use  . Smoking status: Former Smoker    Packs/day: 1.00    Years: 35.00    Pack years: 35.00    Types: Cigarettes    Last attempt to quit: 11/04/1996    Years since quitting: 21.0  . Smokeless tobacco: Never Used  Substance and Sexual Activity  . Alcohol use: None    Comment: ETOH daily  . Drug use: Yes  . Sexual activity: None  Other Topics Concern  . None  Social History Narrative   Married and has children.  Works in [employer].     Family History: The patient's family history includes Asthma in his father and sister; Heart disease in his mother and sister; Parkinsonism in his father; Throat cancer in his sister.  ROS:   Please see the history of present illness.    All other systems reviewed and are negative.  EKGs/Labs/Other  Studies Reviewed:    The following studies were reviewed today: I discussed the findings of previous hospital visits.  He has undergone coronary stenting in 2003.   Recent Labs: No results found for requested labs within last 8760 hours.  Recent Lipid Panel    Component Value Date/Time   CHOL 214 (HH) 01/22/2007 0923   TRIG 97 01/22/2007 0923   HDL 85.7 01/22/2007 0923   CHOLHDL 2.5 CALC 01/22/2007 0923   VLDL 19 01/22/2007 0923   LDLDIRECT 117.9 01/22/2007 9767  Physical Exam:    VS:  BP 140/84 (BP Location: Left Arm, Patient Position: Sitting, Cuff Size: Normal)   Pulse 67   Ht 5' 10.5" (1.791 m)   Wt 178 lb (80.7 kg)   SpO2 98%   BMI 25.18 kg/m     Wt Readings from Last 3 Encounters:  11/06/17 178 lb (80.7 kg)  11/09/14 186 lb (84.4 kg)  10/04/13 182 lb (82.6 kg)     GEN: Patient is in no acute distress HEENT: Normal NECK: No JVD; No carotid bruits LYMPHATICS: No lymphadenopathy CARDIAC: S1 S2 regular, 2/6 systolic murmur at the apex. RESPIRATORY:  Clear to auscultation without rales, wheezing or rhonchi  ABDOMEN: Soft, non-tender, non-distended MUSCULOSKELETAL:  No edema; No deformity  SKIN: Warm and dry NEUROLOGIC:  Alert and oriented x 3 PSYCHIATRIC:  Normal affect    Signed, Jenean Lindau, MD  11/06/2017 9:41 AM    [place]

## 2017-11-07 LAB — CBC WITH DIFFERENTIAL/PLATELET
Basophils Absolute: 0 10*3/uL (ref 0.0–0.2)
Basos: 1 %
EOS (ABSOLUTE): 0.4 10*3/uL (ref 0.0–0.4)
Eos: 7 %
Hematocrit: 34.1 % — ABNORMAL LOW (ref 37.5–51.0)
Hemoglobin: 11.3 g/dL — ABNORMAL LOW (ref 13.0–17.7)
Immature Grans (Abs): 0 10*3/uL (ref 0.0–0.1)
Immature Granulocytes: 0 %
Lymphocytes Absolute: 1.7 10*3/uL (ref 0.7–3.1)
Lymphs: 34 %
MCH: 32.8 pg (ref 26.6–33.0)
MCHC: 33.1 g/dL (ref 31.5–35.7)
MCV: 99 fL — ABNORMAL HIGH (ref 79–97)
Monocytes Absolute: 0.3 10*3/uL (ref 0.1–0.9)
Monocytes: 6 %
Neutrophils Absolute: 2.7 10*3/uL (ref 1.4–7.0)
Neutrophils: 52 %
Platelets: 210 10*3/uL (ref 150–379)
RBC: 3.45 x10E6/uL — ABNORMAL LOW (ref 4.14–5.80)
RDW: 14.1 % (ref 12.3–15.4)
WBC: 5.1 10*3/uL (ref 3.4–10.8)

## 2017-11-07 LAB — BASIC METABOLIC PANEL
BUN/Creatinine Ratio: 17 (ref 10–24)
BUN: 20 mg/dL (ref 8–27)
CO2: 24 mmol/L (ref 20–29)
Calcium: 9.2 mg/dL (ref 8.6–10.2)
Chloride: 103 mmol/L (ref 96–106)
Creatinine, Ser: 1.16 mg/dL (ref 0.76–1.27)
GFR calc Af Amer: 70 mL/min/{1.73_m2} (ref >59)
GFR calc non Af Amer: 60 mL/min/{1.73_m2} (ref >59)
Glucose: 86 mg/dL (ref 65–99)
Potassium: 4.8 mmol/L (ref 3.5–5.2)
Sodium: 140 mmol/L (ref 134–144)

## 2017-11-07 LAB — TSH: TSH: 2.11 u[IU]/mL (ref 0.450–4.500)

## 2017-11-07 LAB — HEPATIC FUNCTION PANEL
ALT: 18 IU/L (ref 0–44)
AST: 25 IU/L (ref 0–40)
Albumin: 4.4 g/dL (ref 3.5–4.8)
Alkaline Phosphatase: 56 IU/L (ref 39–117)
Bilirubin Total: 0.7 mg/dL (ref 0.0–1.2)
Bilirubin, Direct: 0.19 mg/dL (ref 0.00–0.40)
Total Protein: 6.8 g/dL (ref 6.0–8.5)

## 2017-11-07 LAB — LIPID PANEL
Chol/HDL Ratio: 2.4 ratio (ref 0.0–5.0)
Cholesterol, Total: 179 mg/dL (ref 100–199)
HDL: 75 mg/dL (ref >39)
LDL Calculated: 94 mg/dL (ref 0–99)
Triglycerides: 50 mg/dL (ref 0–149)
VLDL Cholesterol Cal: 10 mg/dL (ref 5–40)

## 2017-11-10 ENCOUNTER — Other Ambulatory Visit: Payer: Self-pay | Admitting: Internal Medicine

## 2017-11-10 DIAGNOSIS — R911 Solitary pulmonary nodule: Secondary | ICD-10-CM

## 2017-11-10 DIAGNOSIS — R918 Other nonspecific abnormal finding of lung field: Secondary | ICD-10-CM

## 2017-12-04 ENCOUNTER — Encounter: Payer: Self-pay | Admitting: Internal Medicine

## 2017-12-04 DIAGNOSIS — D649 Anemia, unspecified: Secondary | ICD-10-CM | POA: Diagnosis not present

## 2017-12-18 ENCOUNTER — Telehealth: Payer: Self-pay | Admitting: Internal Medicine

## 2017-12-18 DIAGNOSIS — N189 Chronic kidney disease, unspecified: Secondary | ICD-10-CM | POA: Diagnosis not present

## 2017-12-18 DIAGNOSIS — D649 Anemia, unspecified: Secondary | ICD-10-CM | POA: Diagnosis not present

## 2017-12-18 NOTE — Telephone Encounter (Signed)
Called and spoke with pt. Pt is requesting CT results from 12/08/17 at [facility]. I have checked MW's cubby and it does not appear that records have been received. lm for [facility] regarding faxing over CT.  [place] contact number: [phone]

## 2017-12-18 NOTE — Telephone Encounter (Signed)
Sharyn Lull with [facility] is faxing over CT now to Margie's attention.  No call back is needed.

## 2017-12-23 ENCOUNTER — Telehealth: Payer: Self-pay | Admitting: Internal Medicine

## 2017-12-23 DIAGNOSIS — R918 Other nonspecific abnormal finding of lung field: Secondary | ICD-10-CM

## 2017-12-23 DIAGNOSIS — J45991 Cough variant asthma: Secondary | ICD-10-CM

## 2017-12-23 NOTE — Telephone Encounter (Signed)
Pt returning MW's call regarding his CT chest.  Nothing documented in chart regarding results.  Pt wishes to speak to MW only.  MW please advise.  Thanks!

## 2017-12-23 NOTE — Telephone Encounter (Signed)
lmom give me a time frame best to call  Ok to interrupt me in office if I'm here

## 2017-12-24 NOTE — Telephone Encounter (Signed)
Mobile number has been updated in chart as preferred number. MW please advise if CT results have been relayed to patient.  Thanks!

## 2017-12-24 NOTE — Telephone Encounter (Signed)
Pt is calling back [phone]

## 2017-12-24 NOTE — Telephone Encounter (Signed)
Pt is returning call. Per Pt please call cell phone [phone] at any time during the day. Please do not call on home phone.

## 2017-12-24 NOTE — Telephone Encounter (Signed)
Called and spoke with pt letting him know we needed to get him scheduled for an OV.  OV scheduled with Dr. Melvyn Novas Friday, 01/09/18 at 9:45.  Stated to pt we would have him get a chest xray done the day he comes for the visit.  Pt expressed understanding. Nothing further needed at this current time.

## 2017-12-24 NOTE — Telephone Encounter (Signed)
Forwarding message to MW to call back at listed phone number during day today.

## 2017-12-24 NOTE — Telephone Encounter (Signed)
Yes, done.  

## 2017-12-24 NOTE — Telephone Encounter (Signed)
   He needs follow-up office visit within the next 2 weeks with a chest x-ray. Please enter his cell phone is his preferred number to be contacted as we have been playing phone tag

## 2017-12-24 NOTE — Telephone Encounter (Signed)
lmtcb X1 for pt. Need to schedule 2 week rov with cxr prior to appt. cxr ordered.

## 2018-01-09 ENCOUNTER — Ambulatory Visit: Payer: Medicare HMO | Admitting: Internal Medicine

## 2018-01-09 ENCOUNTER — Other Ambulatory Visit (INDEPENDENT_AMBULATORY_CARE_PROVIDER_SITE_OTHER): Payer: Medicare HMO

## 2018-01-09 ENCOUNTER — Encounter: Payer: Self-pay | Admitting: Internal Medicine

## 2018-01-09 ENCOUNTER — Ambulatory Visit (INDEPENDENT_AMBULATORY_CARE_PROVIDER_SITE_OTHER)
Admission: RE | Admit: 2018-01-09 | Discharge: 2018-01-09 | Disposition: A | Discharge status: Home / Self Care | Payer: Medicare HMO | Source: Ambulatory Visit | Attending: Internal Medicine | Admitting: Internal Medicine

## 2018-01-09 VITALS — BP 148/70 | HR 66 | Ht 70.5 in | Wt 176.6 lb

## 2018-01-09 DIAGNOSIS — J4530 Mild persistent asthma, uncomplicated: Secondary | ICD-10-CM

## 2018-01-09 DIAGNOSIS — R918 Other nonspecific abnormal finding of lung field: Secondary | ICD-10-CM

## 2018-01-09 DIAGNOSIS — J45991 Cough variant asthma: Secondary | ICD-10-CM | POA: Diagnosis not present

## 2018-01-09 LAB — SEDIMENTATION RATE: Sed Rate: 14 mm/hr (ref 0–20)

## 2018-01-09 MED ORDER — BUDESONIDE-FORMOTEROL FUMARATE 160-4.5 MCG/ACT IN AERO: 2.0000 | INHALATION_SPRAY | Freq: Two times a day (BID) | RESPIRATORY_TRACT | 0 refills | Status: DC

## 2018-01-09 NOTE — Progress Notes (Signed)
Subjective:     Patient ID: Brian Dunn, male    DOB: 02/18/1941    MRN: [id number]   Brief patient profile:  62  yowm quit smoking in 2000 with no respiratory problems at all at that point and PFT's documented  minimal airflow obst  02/04/2011    History of Present Illness  October 18, 2010  1st pulmonary office eval for recurrent "pna" x 2 years  = rattling cough/ fatigue/ nasty mucus and difficulty breathing to point of needing recliner requiring prolonged abx improved but not completely better from July thru  November 2011  but on day of ov completely better x for fatigue and cough when eat and still requires mucinex assoc with intermittent hb and nasal congestion but no purulent secretions.  rec Stop flovent/ crevidol/ fish oil/ finsopril  Benicar 40 mg one daily bystolic 20 mg one daily     December 07, 2010       - Confused with meds 02/04/2011 ,  Try off coreg again and rec continue bisoprolol  01/22/2011  Ov much worse cough despite off ace, on bystolic and on ppi.  Esp severe at hs. No purulent sputum or overt hb  Rec CT sinus > neg.  rec Prilosec before breakfast and pepcid 20 mg one at bedtime  Mucinex dm is take 2 every 12 hours if needed for cough or congestion Dulera 200 Take 2 puffs first thing in am and then another 2 puffs about 12 hours later > much better    07/02/2012 f/u ov/Brian Dunn cc marked improvement  >no sob rec Continue dulera 100 2 every 12 hours but work maintaining  perfect inhaler technique:  Reduce bisoprolol 5 mg one half daily and let Dr Brian Landsman do your follow up It is ok to substitute the olmesartan with generic diovan=valsartan but not an ace inhibitor    10/04/2013 f/u ov/Brian Dunn re:  ? Cough variant asthma  Chief Complaint  Patient presents with  . Follow-up    Breathing is unchanged since last OV.  No concerns today  Cough lasts from Nov - April yearly x last 3-4 years typically worse in am prod of min mucoid sputum Only takes dulera 100 2 puffs each  am  rec Increase dulera 200 2 each am to see if you notice any difference in the cough - even increase to 2 puffs every 12 hours if the morning cough is an issue  The best substitute on your formulary for the benicar is irbesartan 300 mg daily but your blood pressure is so low now so I'm reluctant to change it without Dr Brian Dunn input    11/09/2014 f/u ov/Brian Dunn re: here for refills/doing fine  rec Continue the dulera 200 2 each am and add the pm dose if any flare of cough/ wheeze/shortness of breath    01/09/2018  f/u ov/Brian Dunn re: re -establish re abn ct  Chief Complaint  Patient presents with  . Follow-up    ROV   last week Jan 2019 acute cough > yellow sputum  / chest congestion / sob  While being maint BREO (but expense an issue)  Self rx with otc, never got abx/ changed to symbiocort 1st week in Feb 2019 and better Dyspnea:  Now not limited by breathing from desired activities   Cough: none Sleep: ok SABA use:  symb 2 puffs q am rarely   No obvious patterns in day to day or daytime variability or assoc excess/ purulent sputum or mucus plugs  or hemoptysis or cp or chest tightness, subjective wheeze or overt sinus or hb symptoms. No unusual exposure hx or h/o childhood pna/ asthma or knowledge of premature birth.  Sleeping ok flat without nocturnal  or early am exacerbation  of respiratory  c/o's or need for noct saba. Also denies any obvious fluctuation of symptoms with weather or environmental changes or other aggravating or alleviating factors except as outlined above   Current Allergies, Complete Past Medical History, Past Surgical History, Family History, and Social History were reviewed in [employer] record.  ROS  The following are not active complaints unless bolded Hoarseness, sore throat, dysphagia, dental problems, itching, sneezing,  nasal congestion or discharge of excess mucus or purulent secretions, ear ache,   fever, chills, sweats, unintended wt  loss or wt gain, classically pleuritic or exertional cp,  orthopnea pnd or leg swelling, presyncope, palpitations, abdominal pain, anorexia, nausea, vomiting, diarrhea  or change in bowel habits or change in bladder habits, change in stools or change in urine, dysuria, hematuria,  rash, arthralgias, visual complaints, headache, numbness, weakness or ataxia or problems with walking or coordination,  change in mood/affect or memory.        Current Meds  Medication Sig  . albuterol (PROAIR HFA) 108 (90 BASE) MCG/ACT inhaler Inhale 2 puffs into the lungs every 6 (six) hours as needed for wheezing.  Marland Kitchen allopurinol (ZYLOPRIM) 100 MG tablet 1 tablet daily.  . Ascorbic Acid (VITAMIN C) 1000 MG tablet Take 500 mg by mouth.  . budesonide-formoterol (SYMBICORT) 160-4.5 MCG/ACT inhaler Inhale 2 puffs into the lungs 2 (two) times daily.  . Calcium Carbonate-Vitamin D (CALCIUM 600 + D PO) Take by mouth daily.  . celecoxib (CELEBREX) 200 MG capsule Take 200 mg by mouth 2 (two) times daily.   . clopidogrel (PLAVIX) 75 MG tablet Take 75 mg by mouth.  . Coenzyme Q10 (CO Q-10) 75 MG CAPS Take 120 mg by mouth daily.   . cyanocobalamin 50 MCG tablet Take 500 mcg by mouth.  . famotidine (PEPCID) 20 MG tablet Take 20 mg by mouth.  Brian Dunn SULFATE DRIED PO Take by mouth.  . fluticasone (FLONASE) 50 MCG/ACT nasal spray 2 sprays by Nasal route daily.  . irbesartan (AVAPRO) 300 MG tablet Take 300 mg by mouth.   . loratadine (CLARITIN) 10 MG tablet Take 10 mg by mouth.  . Magnesium 400 MG CAPS Take by mouth. 3 tabs daily  . Multiple Vitamin (MULTIVITAMIN) capsule Take 1 capsule by mouth daily.    . nitroGLYCERIN (NITROSTAT) 0.4 MG SL tablet Place 1 tablet (0.4 mg total) under the tongue every 5 (five) minutes as needed for chest pain.  . primidone (MYSOLINE) 50 MG tablet Take 50 mg by mouth at bedtime.   . Tamsulosin HCl (FLOMAX) 0.4 MG CAPS Take 0.4 mg by mouth as needed.   . [DISCONTINUED] budesonide-formoterol  (SYMBICORT) 160-4.5 MCG/ACT inhaler Inhale 2 puffs into the lungs 2 (two) times daily.                  Past Medical History: COPD (ICD-496)     - HFA 75% p coaching 01/22/2011      - PFT's 02/04/2011 very minimal airflow obst   HYPOGONADISM (ICD-257.2) TREMOR, ESSENTIAL (ICD-333.1) CHEST PAIN (ICD-786.50) HIP FRACTURE (ICD-820.8) OSTEOPENIA (ICD-733.90) ALLERGIC RHINITIS (ICD-477.9) GERD (ICD-530.81) COUGH     - Sinus CT 01/24/11 There is mucosal thickening within the paranasal sinuses without  air-fluid levels. Neither ostiomeatal unit is patent.  HX, PERSONAL, MALIGNANCY, PROSTATE (ICD-V10.46) HYPERTENSION (ICD-401.9)                Objective:   Physical Exam  amb wm easily confused with details of care    wt 173 October 18, 2010  >   174 02/04/2011 >  > 06/18/2012 175 > 07/02/2012  174> 09/30/2012  185> 10/04/2013  182 > 11/09/2014  187 > 01/10/2018  176   Vital signs reviewed - Note on arrival 02 sats  98% on RA   HEENT: nl dentition, turbinates bilaterally, and oropharynx. Nl external ear canals without cough reflex   NECK :  without JVD/Nodes/TM/ nl carotid upstrokes bilaterally   LUNGS: no acc muscle use,  Nl contour chest with minimal insp and exp rhonchi bilaterally    CV:  RRR  no s3 or murmur or increase in P2, and no edema   ABD:  soft and nontender with nl inspiratory excursion in the supine position. No bruits or organomegaly appreciated, bowel sounds nl  MS:  Nl gait/ ext warm without deformities, calf tenderness, cyanosis or clubbing Classic RA changes bilateral MCPs   SKIN: warm and dry without lesions    NEURO:  alert, approp, nl sensorium with  no motor or cerebellar deficits apparent.         CXR PA and Lateral:   01/09/2018 :    I personally reviewed images and agree with radiology impression as follows:    No active cardiopulmonary disease.      Labs ordered 01/09/2018   Collagen vasc profile     Lab Results  Component Value Date    ESRSEDRATE 14 01/09/2018      Assessment & Plan:

## 2018-01-09 NOTE — Patient Instructions (Addendum)
No change in medications  symbicort 160 2 each am and 2 in pm if needed  Work on inhaler technique:  relax and gently blow all the way out then take a nice smooth deep breath back in, triggering the inhaler at same time you start breathing in.  Hold for up to 5 seconds if you can. Blow out thru nose. Rinse and gargle with water when done   Please remember to go to the lab department downstairs in the basement  for your tests - we will call you with the results when they are available.      Please schedule a follow up visit in 3 months but call sooner if needed with pfts

## 2018-01-10 ENCOUNTER — Encounter: Payer: Self-pay | Admitting: Internal Medicine

## 2018-01-10 NOTE — Assessment & Plan Note (Addendum)
-   01/09/2018  After extensive coaching inhaler device  effectiveness =   75% > continue symb 160 2bid and return for pfts in 3 months ? RA bronchiolitis?   In meantime asthmatic component is mild and ok to taper symbicort to just 2 pffs in am   Based on the study from NEJM  378; 20 p 1865 (2018) in pts with mild asthma it is reasonable to use low dose symbicort eg 80 2bid "prn" flare in this setting but I emphasized this was only shown with symbicort and takes advantage of the rapid onset of action but is not the same as "rescue therapy" but can be stopped once the acute symptoms have resolved and the need for rescue has been minimized (< 2 x weekly)     Total time devoted to counseling  > 50 % of initial (re-establish p 3 years)  60 min office visit:  review case with pt/ discussion of options/alternatives/ personally creating written customized instructions  in presence of pt  then going over those specific  Instructions directly with the pt including how to use all of the meds but in particular covering each new medication in detail and the difference between the maintenance= "automatic" meds and the prns using an action plan format for the latter (If this problem/symptom => do that organization reading Left to right).  Please see AVS from this visit for a full list of these instructions which I personally wrote for this pt and  are unique to this visit.

## 2018-01-10 NOTE — Assessment & Plan Note (Signed)
CT [place] 10/17/15 mpns > 15 y since quit smoking > rec 12 m f/u as this is low risk > done 06/04/16 no change rec recheck in 85m  - CT [place] 12/08/17 no change nodules > meet benign criteria > no directed f/u but has micronodularity suggestive of MAI   This is an extremely common benign condition in the elderly and does not warrant aggressive eval/ rx at this point unless there is a clinical correlation suggesting unaddressed pulmonary infection (purulent sputum, night sweats, unintended wt loss, doe) or evolution of  obvious changes on plain cxr (as opposed to serial CT, which is way over sensitive to make clinical decisions re intervention and treatment in the elderly, who tend to tolerate both dx and treatment poorly) .   Joint changes are suggestive of RA and the other possibility is co-existing RA lung dz but he was told he did not have RA years ago so rec repeat the studies

## 2018-01-12 LAB — QUANTIFERON-TB GOLD PLUS
Mitogen-NIL: >10 IU/mL
NIL: 0.05 IU/mL
QuantiFERON-TB Gold Plus: NEGATIVE
TB1-NIL: 0.12 IU/mL
TB2-NIL: 0.14 IU/mL

## 2018-01-12 LAB — CYCLIC CITRUL PEPTIDE ANTIBODY, IGG: Cyclic Citrullin Peptide Ab: <16 UNITS

## 2018-01-12 LAB — RHEUMATOID FACTOR: Rhuematoid fact SerPl-aCnc: <14 IU/mL (ref <14)

## 2018-01-13 ENCOUNTER — Telehealth: Payer: Self-pay | Admitting: Internal Medicine

## 2018-01-13 NOTE — Telephone Encounter (Signed)
Notes recorded by Tanda Rockers, MD on 01/12/2018 at 5:30 PM EDT Call patient : Studies are unremarkable, no change in recs  Advised pt of results. Pt understood and nothing further is needed.

## 2018-01-13 NOTE — Progress Notes (Signed)
LMTCB

## 2018-03-31 DIAGNOSIS — N189 Chronic kidney disease, unspecified: Secondary | ICD-10-CM | POA: Diagnosis not present

## 2018-03-31 DIAGNOSIS — D649 Anemia, unspecified: Secondary | ICD-10-CM | POA: Diagnosis not present

## 2018-04-13 ENCOUNTER — Ambulatory Visit: Payer: Medicare HMO | Admitting: Internal Medicine

## 2018-04-23 ENCOUNTER — Encounter: Payer: Self-pay | Admitting: Internal Medicine

## 2018-04-23 ENCOUNTER — Ambulatory Visit: Payer: Medicare HMO | Admitting: Internal Medicine

## 2018-04-23 ENCOUNTER — Ambulatory Visit (INDEPENDENT_AMBULATORY_CARE_PROVIDER_SITE_OTHER): Payer: Medicare HMO | Admitting: Internal Medicine

## 2018-04-23 VITALS — BP 150/70 | HR 71 | Ht 71.0 in | Wt 177.0 lb

## 2018-04-23 DIAGNOSIS — R918 Other nonspecific abnormal finding of lung field: Secondary | ICD-10-CM | POA: Diagnosis not present

## 2018-04-23 DIAGNOSIS — J4520 Mild intermittent asthma, uncomplicated: Secondary | ICD-10-CM | POA: Diagnosis not present

## 2018-04-23 DIAGNOSIS — J45991 Cough variant asthma: Secondary | ICD-10-CM

## 2018-04-23 LAB — PULMONARY FUNCTION TEST
DL/VA % pred: 79 %
DL/VA: 3.7 ml/min/mmHg/L
DLCO unc % pred: 76 %
DLCO unc: 25.85 ml/min/mmHg
FEF 25-75 Post: 1.79 L/sec
FEF 25-75 Pre: 1.97 L/sec
FEF2575-%Change-Post: -8 %
FEF2575-%Pred-Post: 81 %
FEF2575-%Pred-Pre: 89 %
FEV1-%Change-Post: -3 %
FEV1-%Pred-Post: 88 %
FEV1-%Pred-Pre: 91 %
FEV1-Post: 2.73 L
FEV1-Pre: 2.84 L
FEV1FVC-%Change-Post: 0 %
FEV1FVC-%Pred-Pre: 101 %
FEV6-%Change-Post: -5 %
FEV6-%Pred-Post: 90 %
FEV6-%Pred-Pre: 95 %
FEV6-Post: 3.63 L
FEV6-Pre: 3.85 L
FEV6FVC-%Change-Post: 0 %
FEV6FVC-%Pred-Post: 105 %
FEV6FVC-%Pred-Pre: 105 %
FVC-%Change-Post: -4 %
FVC-%Pred-Post: 86 %
FVC-%Pred-Pre: 90 %
FVC-Post: 3.73 L
FVC-Pre: 3.89 L
Post FEV1/FVC ratio: 73 %
Post FEV6/FVC ratio: 100 %
Pre FEV1/FVC ratio: 73 %
Pre FEV6/FVC Ratio: 99 %
RV % pred: -45 %
RV: -1.21 L
TLC % pred: 75 %
TLC: 5.46 L

## 2018-04-23 MED ORDER — BUDESONIDE-FORMOTEROL FUMARATE 160-4.5 MCG/ACT IN AERO: 2.0000 | INHALATION_SPRAY | Freq: Two times a day (BID) | RESPIRATORY_TRACT | 0 refills | Status: DC

## 2018-04-23 NOTE — Progress Notes (Signed)
Subjective:     Patient ID: Brian Dunn, male    DOB: 1940/12/05    MRN: [id number]   Brief patient profile:  56  yowm quit smoking in 2000 with no respiratory problems at all at that point and PFT's documented  minimal airflow obst  02/04/2011 c/w mild asthma rather than copd    History of Present Illness  October 18, 2010  1st pulmonary office eval for recurrent "pna" x 2 years  = rattling cough/ fatigue/ nasty mucus and difficulty breathing to point of needing recliner requiring prolonged abx improved but not completely better from July thru  November 2011  but on day of ov completely better x for fatigue and cough when eat and still requires mucinex assoc with intermittent hb and nasal congestion but no purulent secretions.  rec Stop flovent/ crevidol/ fish oil/ finsopril  Benicar 40 mg one daily bystolic 20 mg one daily     December 07, 2010       - Confused with meds 02/04/2011 ,  Try off coreg again and rec continue bisoprolol  01/22/2011  Ov much worse cough despite off ace, on bystolic and on ppi.  Esp severe at hs. No purulent sputum or overt hb  Rec CT sinus > neg.  rec Prilosec before breakfast and pepcid 20 mg one at bedtime  Mucinex dm is take 2 every 12 hours if needed for cough or congestion Dulera 200 Take  2 puffs first thing in am and then another 2 puffs about 12 hours later > much better   11/09/2014 f/u ov/Brian Dunn re: here for refills/doing fine  rec Continue the dulera 200 2 each am and add the pm dose if any flare of cough/ wheeze/shortness of breath    01/09/2018  f/u ov/Brian Dunn re: re -establish re abn ct  Chief Complaint  Patient presents with  . Follow-up    ROV   last week Jan 2019 acute cough > yellow sputum  / chest congestion / sob  While being maint BREO (but expense an issue)  Self rx with otc, never got abx/ changed to symbiocort 1st week in Feb 2019 and better Dyspnea:  Now not limited by breathing from desired activities   Cough: none Sleep: ok SABA  use:  symb 2 puffs q am rarely  rec No change in medications Symbicort 160 2 each am and 2 in pm if needed Work on inhaler technique: Please schedule a follow up visit in 3 months but call sooner if needed with pfts     04/23/2018  f/u ov/Brian Dunn re:  RA /mild asthma on symb 160 x 2 each am  Chief Complaint  Patient presents with  . Follow-up    PFTs today, non-productive cough, GERD  Dyspnea:   Not limited by breathing from desired activities   Cough: minimal now  Sleep: flat ok  SABA use:  Never    No obvious day to day or daytime variability or assoc excess/ purulent sputum or mucus plugs or hemoptysis or cp or chest tightness, subjective wheeze or overt sinus or hb symptoms. No unusual exposure hx or h/o childhood pna/ asthma or knowledge of premature birth.  Sleeping  Flat ok  without nocturnal  or early am exacerbation  of respiratory  c/o's or need for noct saba. Also denies any obvious fluctuation of symptoms with weather or environmental changes or other aggravating or alleviating factors except as outlined above   Current Allergies, Complete Past Medical History, Past Surgical  History, Family History, and Social History were reviewed in [employer] record.  ROS  The following are not active complaints unless bolded Hoarseness, sore throat, dysphagia, dental problems, itching, sneezing,  nasal congestion or discharge of excess mucus or purulent secretions, ear ache,   fever, chills, sweats, unintended wt loss or wt gain, classically pleuritic or exertional cp,  orthopnea pnd or arm/hand swelling  or leg swelling, presyncope, palpitations, abdominal pain, anorexia, nausea, vomiting, diarrhea  or change in bowel habits or change in bladder habits, change in stools or change in urine, dysuria, hematuria,  rash, arthralgias, visual complaints, headache, numbness, weakness or ataxia or problems with walking or coordination,  change in mood or  memory.         Current Meds  Medication Sig  . albuterol (PROAIR HFA) 108 (90 BASE) MCG/ACT inhaler Inhale 2 puffs into the lungs every 6 (six) hours as needed for wheezing.  Marland Kitchen allopurinol (ZYLOPRIM) 100 MG tablet 1 tablet daily.  . Ascorbic Acid (VITAMIN C) 1000 MG tablet Take 500 mg by mouth.  . budesonide-formoterol (SYMBICORT) 160-4.5 MCG/ACT inhaler Inhale 2 puffs into the lungs 2 (two) times daily.  . Calcium Carbonate-Vitamin D (CALCIUM 600 + D PO) Take by mouth daily.  . celecoxib (CELEBREX) 200 MG capsule Take 200 mg by mouth 2 (two) times daily.   . clopidogrel (PLAVIX) 75 MG tablet Take 75 mg by mouth.  . Coenzyme Q10 (CO Q-10) 75 MG CAPS Take 120 mg by mouth daily.   . cyanocobalamin 50 MCG tablet Take 500 mcg by mouth.  . famotidine (PEPCID) 20 MG tablet Take 20 mg by mouth.  Brian Dunn SULFATE DRIED PO Take by mouth.  . fluticasone (FLONASE) 50 MCG/ACT nasal spray 2 sprays by Nasal route daily.  . irbesartan (AVAPRO) 300 MG tablet Take 300 mg by mouth.   . loratadine (CLARITIN) 10 MG tablet Take 10 mg by mouth.  . Magnesium 400 MG CAPS Take by mouth. 3 tabs daily  . Multiple Vitamin (MULTIVITAMIN) capsule Take 1 capsule by mouth daily.    . nitroGLYCERIN (NITROSTAT) 0.4 MG SL tablet Place 1 tablet (0.4 mg total) under the tongue every 5 (five) minutes as needed for chest pain.  . primidone (MYSOLINE) 50 MG tablet Take 50 mg by mouth at bedtime.   . Tamsulosin HCl (FLOMAX) 0.4 MG CAPS Take 0.4 mg by mouth as needed.   . [  budesonide-formoterol (SYMBICORT) 160-4.5 MCG/ACT inhaler Inhale 2 puffs into the lungs 2 (two) times daily.            Past Medical History: COPD (ICD-496) gold 0      - HFA 75% p coaching 01/22/2011      - PFT's 02/04/2011 very minimal airflow obst   HYPOGONADISM (ICD-257.2) TREMOR, ESSENTIAL (ICD-333.1) CHEST PAIN (ICD-786.50) HIP FRACTURE (ICD-820.8) OSTEOPENIA (ICD-733.90) ALLERGIC RHINITIS (ICD-477.9) GERD (ICD-530.81) COUGH     - Sinus CT 01/24/11 There  is mucosal thickening within the paranasal sinuses without  air-fluid levels. Neither ostiomeatal unit is patent. HX, PERSONAL, MALIGNANCY, PROSTATE (ICD-V10.46) HYPERTENSION (ICD-401.9)                Objective:   Physical Exam  amb wm nad   wt 173 October 18, 2010  >   174 02/04/2011 >  > 06/18/2012 175 > 07/02/2012  174> 09/30/2012  185> 10/04/2013  182 > 11/09/2014  187 > 01/10/2018  176 > 04/23/2018  177   Vital signs reviewed - Note on  arrival 02 sats  99% on RA       HEENT: nl dentition, turbinates bilaterally, and oropharynx. Nl external ear canals without cough reflex   NECK :  without JVD/Nodes/TM/ nl carotid upstrokes bilaterally   LUNGS: no acc muscle use,  Nl contour chest with minimal insp/exp rhonchi bilaterally without cough on insp or exp maneuvers   CV:  RRR  no s3 or murmur or increase in P2, and no edema   ABD:  soft and nontender with nl inspiratory excursion in the supine position. No bruits or organomegaly appreciated, bowel sounds nl  MS:  Nl gait/ ext warm without deformities, calf tenderness, cyanosis or clubbing Classic RA changes bilateral MCPs   SKIN: warm and dry without lesions    NEURO:  alert, approp, nl sensorium with  no motor or cerebellar deficits apparent.       .            Assessment & Plan:

## 2018-04-23 NOTE — Patient Instructions (Signed)
If you are satisfied with your treatment plan,  let your doctor know and he/she can either refill your medications or you can return here when your prescription runs out.     If in any way you are not 100% satisfied,  please tell us.  If 100% better, tell your friends!  Pulmonary follow up is as needed   

## 2018-04-23 NOTE — Progress Notes (Signed)
PFT completed today. 04/23/18  

## 2018-04-26 ENCOUNTER — Encounter: Payer: Self-pay | Admitting: Internal Medicine

## 2018-04-26 NOTE — Assessment & Plan Note (Signed)
CT [place] 10/17/15 mpns > 15 y since quit smoking > rec 12 m f/u as this is low risk > done 06/04/16 no change rec recheck in 27m  - CT [place] 12/08/17 no change nodules > meets benign criteria > no directed f/u - Quantiferon GOLD TB 01/09/18 neg   No f/u needed here unless directed by rheum for either rheum related ild or concern re drug toxicity

## 2018-04-26 NOTE — Assessment & Plan Note (Signed)
-   01/09/2018  After extensive coaching inhaler device  effectiveness =   75% > continue symb 160 2bid and return for pfts in 3 months ? RA bronchiolitis?  - PFT's  04/23/2018  FEV1 2.84 (91 % ) ratio 73  p no % improvement from saba p symb 160 prior to study with DLCO  76 % corrects to 79  % for alv volume   - 04/23/2018  After extensive coaching inhaler device  effectiveness =    75% (short Ti)  The asthmatic component appears very well controlled on just symb 160 2bid  Based on two studies from NEJM  378; 20 p 1865 (2018) and 380 : p2020-30 (2019) in pts with mild asthma it is reasonable to use low dose symbicort eg 160 up to  2bid "prn" flare in this setting but I emphasized this was only shown with symbicort and takes advantage of the rapid onset of action but is not the same as "rescue therapy" but can be stopped once the acute symptoms have resolved and the need for rescue has been minimized (< 2 x weekly)    Pulmonary f/u can be prn at this point  I had an extended discussion with the patient reviewing all relevant studies completed to date and  lasting 15 to 20 minutes of a 25 minute visit    See device teaching which extended face to face time for this visit.  Each maintenance medication was reviewed in detail including emphasizing most importantly the difference between maintenance and prns and under what circumstances the prns are to be triggered using an action plan format that is not reflected in the computer generated alphabetically organized AVS which I have not found useful in most complex patients, especially with respiratory illnesses  Please see AVS for specific instructions unique to this visit that I personally wrote and verbalized to the the pt in detail and then reviewed with pt  by my nurse highlighting any  changes in therapy recommended at today's visit to their plan of care.

## 2018-07-31 DIAGNOSIS — D649 Anemia, unspecified: Secondary | ICD-10-CM | POA: Diagnosis not present

## 2018-07-31 DIAGNOSIS — N189 Chronic kidney disease, unspecified: Secondary | ICD-10-CM | POA: Diagnosis not present

## 2018-11-17 ENCOUNTER — Other Ambulatory Visit: Payer: Self-pay

## 2018-11-17 DIAGNOSIS — I2510 Atherosclerotic heart disease of native coronary artery without angina pectoris: Secondary | ICD-10-CM

## 2018-11-17 MED ORDER — CLOPIDOGREL BISULFATE 75 MG PO TABS: 75.0000 mg | ORAL_TABLET | Freq: Every day | ORAL | 0 refills | Status: DC

## 2018-12-09 ENCOUNTER — Other Ambulatory Visit: Payer: Self-pay | Admitting: Cardiology

## 2018-12-09 DIAGNOSIS — I2510 Atherosclerotic heart disease of native coronary artery without angina pectoris: Secondary | ICD-10-CM

## 2018-12-21 ENCOUNTER — Encounter: Payer: Self-pay | Admitting: Cardiology

## 2018-12-21 ENCOUNTER — Other Ambulatory Visit: Payer: Self-pay | Admitting: Cardiology

## 2018-12-21 ENCOUNTER — Ambulatory Visit: Payer: Medicare HMO | Admitting: Cardiology

## 2018-12-21 VITALS — BP 142/68 | HR 70 | Ht 71.0 in | Wt 180.0 lb

## 2018-12-21 DIAGNOSIS — R918 Other nonspecific abnormal finding of lung field: Secondary | ICD-10-CM

## 2018-12-21 DIAGNOSIS — E782 Mixed hyperlipidemia: Secondary | ICD-10-CM

## 2018-12-21 DIAGNOSIS — I2510 Atherosclerotic heart disease of native coronary artery without angina pectoris: Secondary | ICD-10-CM | POA: Diagnosis not present

## 2018-12-21 DIAGNOSIS — J45991 Cough variant asthma: Secondary | ICD-10-CM

## 2018-12-21 DIAGNOSIS — R911 Solitary pulmonary nodule: Secondary | ICD-10-CM

## 2018-12-21 DIAGNOSIS — I10 Essential (primary) hypertension: Secondary | ICD-10-CM | POA: Diagnosis not present

## 2018-12-21 MED ORDER — NITROGLYCERIN 0.4 MG SL SUBL: 0.4000 mg | SUBLINGUAL_TABLET | SUBLINGUAL | 11 refills | Status: DC | PRN

## 2018-12-21 MED ORDER — CLOPIDOGREL BISULFATE 75 MG PO TABS: 75.0000 mg | ORAL_TABLET | Freq: Every day | ORAL | 0 refills | Status: DC

## 2018-12-21 NOTE — Addendum Note (Signed)
Addended by: Tarri Glenn on: 12/21/2018 04:40 PM   Modules accepted: Orders

## 2018-12-21 NOTE — Progress Notes (Signed)
Cardiology Office Note:    Date:  12/21/2018   ID:  Brian Dunn, DOB 1941/04/13, MRN [id number]  PCP:  Angelina Sheriff, MD  Cardiologist:  Jenean Lindau, MD   Referring MD: Angelina Sheriff, MD    ASSESSMENT:    1. Coronary artery disease involving native coronary artery of native heart without angina pectoris   2. Essential hypertension   3. Mixed hyperlipidemia    PLAN:    In order of problems listed above:  1. Secondary prevention stressed with the patient.  Importance of compliance with diet and medication stressed and he vocalized understanding.  His blood pressure is stable.  Diet was discussed for dyslipidemia.  I discussed lipid-lowering but is not keen on it.  He wants it checked in the next few days and we will do the same. 2. Sublingual nitroglycerin prescription was sent, its protocol and 911 protocol explained and the patient vocalized understanding questions were answered to the patient's satisfaction 3. He has known coronary artery disease and leads a sedentary lifestyle.  It is been a long time since he has been assessed for this and he will undergo an exercise stress Cardiolite to assess coronary artery disease and progress. 4. Patient will be seen in follow-up appointment in 6 months or earlier if the patient has any concerns    Medication Adjustments/Labs and Tests Ordered: Current medicines are reviewed at length with the patient today.  Concerns regarding medicines are outlined above.  No orders of the defined types were placed in this encounter.  No orders of the defined types were placed in this encounter.    No chief complaint on file.    History of Present Illness:    Brian Dunn is a 78 y.o. male.  Patient has known coronary artery disease.  He denies any problems at this time and takes care of activities of daily living.  No chest pain orthopnea or PND.  He leads a sedentary lifestyle.  He is vehemently against using statins and does not  want any lipid-lowering medications.  Past Medical History:  Diagnosis Date  . Allergic rhinitis   . Chest pain, unspecified   . COPD (chronic obstructive pulmonary disease) ([place])   . Essential and other specified forms of tremor   . GERD (gastroesophageal reflux disease)   . Hip fracture ([place])   . Hypertension   . Hypogonadism male   . Osteopenia   . Personal history of malignant neoplasm of prostate     Past Surgical History:  Procedure Laterality Date  . CORONARY ANGIOPLASTY WITH STENT PLACEMENT  2008  . INSERTION PROSTATE RADIATION SEED      Current Medications: Current Meds  Medication Sig  . albuterol (PROAIR HFA) 108 (90 BASE) MCG/ACT inhaler Inhale 2 puffs into the lungs every 6 (six) hours as needed for wheezing.  Marland Kitchen allopurinol (ZYLOPRIM) 100 MG tablet Take 1 tablet by mouth daily.   . Ascorbic Acid (VITAMIN C) 1000 MG tablet Take 500 mg by mouth.  . budesonide-formoterol (SYMBICORT) 160-4.5 MCG/ACT inhaler Inhale 2 puffs into the lungs 2 (two) times daily.  . Calcium Carbonate-Vitamin D (CALCIUM 600 + D PO) Take by mouth daily.  . celecoxib (CELEBREX) 200 MG capsule Take 200 mg by mouth daily.   . clopidogrel (PLAVIX) 75 MG tablet Take 1 tablet (75 mg total) by mouth daily.  . Coenzyme Q10 (CO Q-10) 75 MG CAPS Take 120 mg by mouth daily.   . cyanocobalamin 50  MCG tablet Take 500 mcg by mouth daily.   . famotidine (PEPCID) 20 MG tablet Take 20 mg by mouth daily.   Marland Kitchen FERROUS SULFATE DRIED PO Take by mouth.  . fluticasone (FLONASE) 50 MCG/ACT nasal spray 2 sprays by Nasal route daily.  . irbesartan (AVAPRO) 300 MG tablet Take 300 mg by mouth daily.   Marland Kitchen loratadine (CLARITIN) 10 MG tablet Take 10 mg by mouth daily.   . Magnesium 400 MG CAPS Take by mouth. 3 tabs daily  . Multiple Vitamin (MULTIVITAMIN) capsule Take 1 capsule by mouth daily.    . nitroGLYCERIN (NITROSTAT) 0.4 MG SL tablet Place 1 tablet (0.4 mg total) under the tongue every 5 (five) minutes as needed for  chest pain.  . primidone (MYSOLINE) 50 MG tablet Take 50 mg by mouth at bedtime.   . Tamsulosin HCl (FLOMAX) 0.4 MG CAPS Take 0.4 mg by mouth as needed.      Allergies:   Metoprolol; Morphine; Penicillin g; Penicillins; and Propranolol   Social History   Socioeconomic History  . Marital status: Married    Spouse name: Not on file  . Number of children: Not on file  . Years of education: Not on file  . Highest education level: Not on file  Occupational History  . Not on file  Social Needs  . Financial resource strain: Not on file  . Food insecurity:    Worry: Not on file    Inability: Not on file  . Transportation needs:    Medical: Not on file    Non-medical: Not on file  Tobacco Use  . Smoking status: Former Smoker    Packs/day: 1.00    Years: 35.00    Pack years: 35.00    Types: Cigarettes    Last attempt to quit: 11/04/1996    Years since quitting: 22.1  . Smokeless tobacco: Never Used  Substance and Sexual Activity  . Alcohol use: Not on file    Comment: ETOH daily  . Drug use: Yes  . Sexual activity: Not on file  Lifestyle  . Physical activity:    Days per week: Not on file    Minutes per session: Not on file  . Stress: Not on file  Relationships  . Social connections:    Talks on phone: Not on file    Gets together: Not on file    Attends religious service: Not on file    Active member of club or organization: Not on file    Attends meetings of clubs or organizations: Not on file    Relationship status: Not on file  Other Topics Concern  . Not on file  Social History Narrative   Married and has children.  Works in [employer].     Family History: The patient's family history includes Asthma in his father and sister; Heart disease in his mother and sister; Parkinsonism in his father; Throat cancer in his sister.  ROS:   Please see the history of present illness.    All other systems reviewed and are negative.  EKGs/Labs/Other Studies Reviewed:    The  following studies were reviewed today: I discussed my findings with the patient at length.  EKG reveals sinus rhythm and nonspecific ST-T changes.   Recent Labs: No results found for requested labs within last 8760 hours.  Recent Lipid Panel    Component Value Date/Time   CHOL 179 11/06/2017 0943   TRIG 50 11/06/2017 0943   HDL 75 11/06/2017 0943  CHOLHDL 2.4 11/06/2017 0943   CHOLHDL 2.5 CALC 01/22/2007 0923   VLDL 19 01/22/2007 0923   LDLCALC 94 11/06/2017 0943   LDLDIRECT 117.9 01/22/2007 0923    Physical Exam:    VS:  BP (!) 142/68 (BP Location: Right Arm, Patient Position: Sitting, Cuff Size: Normal)   Pulse 70   Ht 5\' 11"  (1.803 m)   Wt 180 lb (81.6 kg)   SpO2 99%   BMI 25.10 kg/m     Wt Readings from Last 3 Encounters:  12/21/18 180 lb (81.6 kg)  04/23/18 177 lb (80.3 kg)  01/09/18 176 lb 9.6 oz (80.1 kg)     GEN: Patient is in no acute distress HEENT: Normal NECK: No JVD; No carotid bruits LYMPHATICS: No lymphadenopathy CARDIAC: Hear sounds regular, 2/6 systolic murmur at the apex. RESPIRATORY:  Clear to auscultation without rales, wheezing or rhonchi  ABDOMEN: Soft, non-tender, non-distended MUSCULOSKELETAL:  No edema; No deformity  SKIN: Warm and dry NEUROLOGIC:  Alert and oriented x 3 PSYCHIATRIC:  Normal affect   Signed, Jenean Lindau, MD  12/21/2018 3:36 PM    [facility]

## 2018-12-21 NOTE — Patient Instructions (Signed)
Medication Instructions:   Your physician recommends that you continue on your current medications as directed. Please refer to the Current Medication list given to you today.   If you need a refill on your cardiac medications before your next appointment, please call your pharmacy.   Lab work:  Your physician recommends that you return for lab work when you come back for testing: BMP, CBC, TSH, LFT, LIPIDS   If you have labs (blood work) drawn today and your tests are completely normal, you will receive your results only by: Marland Kitchen MyChart Message (if you have MyChart) OR . A paper copy in the mail If you have any lab test that is abnormal or we need to change your treatment, we will call you to review the results.  Testing/Procedures:  Your physician has requested that you have en exercise stress myoview. For further information please visit [email or web address]. Please follow instruction sheet, as given.    Follow-Up: At [facility], you and your health needs are our priority.  As part of our continuing mission to provide you with exceptional heart care, we have created designated Provider Care Teams.  These Care Teams include your primary Cardiologist (physician) and Advanced Practice Providers (APPs -  Physician Assistants and Nurse Practitioners) who all work together to provide you with the care you need, when you need it.  You will need a follow up appointment in 6 months.  Please call our office 2 months in advance to schedule this appointment.

## 2019-01-12 ENCOUNTER — Telehealth (HOSPITAL_COMMUNITY): Payer: Self-pay | Admitting: *Deleted

## 2019-01-12 NOTE — Telephone Encounter (Signed)
Left message on voicemail per DPR in reference to upcoming appointment scheduled on 01/19/19 at [facility] with detailed instructions given per Myocardial Perfusion Study Information Sheet for the test. LM to arrive 15 minutes early, and that it is imperative to arrive on time for appointment to keep from having the test rescheduled. If you need to cancel or reschedule your appointment, please call the office within 24 hours of your appointment. Failure to do so may result in a cancellation of your appointment, and a $50 no show fee. Phone number given for call back for any questions. Alleah Dearman, Ranae Palms

## 2019-01-13 ENCOUNTER — Other Ambulatory Visit: Payer: Self-pay | Admitting: Cardiology

## 2019-01-13 DIAGNOSIS — I2510 Atherosclerotic heart disease of native coronary artery without angina pectoris: Secondary | ICD-10-CM

## 2019-01-13 DIAGNOSIS — E782 Mixed hyperlipidemia: Secondary | ICD-10-CM

## 2019-01-13 DIAGNOSIS — I10 Essential (primary) hypertension: Secondary | ICD-10-CM

## 2019-01-19 ENCOUNTER — Telehealth: Payer: Self-pay

## 2019-01-19 ENCOUNTER — Other Ambulatory Visit: Payer: Self-pay

## 2019-01-19 ENCOUNTER — Ambulatory Visit (INDEPENDENT_AMBULATORY_CARE_PROVIDER_SITE_OTHER): Payer: Medicare HMO

## 2019-01-19 VITALS — Ht 71.0 in | Wt 180.0 lb

## 2019-01-19 DIAGNOSIS — I2510 Atherosclerotic heart disease of native coronary artery without angina pectoris: Secondary | ICD-10-CM

## 2019-01-19 DIAGNOSIS — I10 Essential (primary) hypertension: Secondary | ICD-10-CM

## 2019-01-19 DIAGNOSIS — R079 Chest pain, unspecified: Secondary | ICD-10-CM

## 2019-01-19 DIAGNOSIS — E782 Mixed hyperlipidemia: Secondary | ICD-10-CM

## 2019-01-19 LAB — MYOCARDIAL PERFUSION IMAGING
Estimated workload: 7 METS
Exercise duration (min): 5 min
Exercise duration (sec): 30 s
LV dias vol: 117 mL (ref 62–150)
LV sys vol: 48 mL
MPHR: 142 {beats}/min
Peak HR: 126 {beats}/min
Percent HR: 88 %
Rest HR: 74 {beats}/min
SDS: 2
SRS: 3
SSS: 5
TID: 1.06

## 2019-01-19 LAB — LIPID PANEL
Chol/HDL Ratio: 2.7 ratio (ref 0.0–5.0)
Cholesterol, Total: 184 mg/dL (ref 100–199)
HDL: 69 mg/dL (ref >39)
LDL Calculated: 99 mg/dL (ref 0–99)
Triglycerides: 80 mg/dL (ref 0–149)
VLDL Cholesterol Cal: 16 mg/dL (ref 5–40)

## 2019-01-19 LAB — CBC
Hematocrit: 33.4 % — ABNORMAL LOW (ref 37.5–51.0)
Hemoglobin: 11.8 g/dL — ABNORMAL LOW (ref 13.0–17.7)
MCH: 34.1 pg — ABNORMAL HIGH (ref 26.6–33.0)
MCHC: 35.3 g/dL (ref 31.5–35.7)
MCV: 97 fL (ref 79–97)
Platelets: 226 10*3/uL (ref 150–450)
RBC: 3.46 x10E6/uL — ABNORMAL LOW (ref 4.14–5.80)
RDW: 11.7 % (ref 11.6–15.4)
WBC: 5.3 10*3/uL (ref 3.4–10.8)

## 2019-01-19 LAB — BASIC METABOLIC PANEL
BUN/Creatinine Ratio: 13 (ref 10–24)
BUN: 16 mg/dL (ref 8–27)
CO2: 21 mmol/L (ref 20–29)
Calcium: 9.6 mg/dL (ref 8.6–10.2)
Chloride: 97 mmol/L (ref 96–106)
Creatinine, Ser: 1.26 mg/dL (ref 0.76–1.27)
GFR calc Af Amer: 63 mL/min/{1.73_m2} (ref >59)
GFR calc non Af Amer: 54 mL/min/{1.73_m2} — ABNORMAL LOW (ref >59)
Glucose: 87 mg/dL (ref 65–99)
Potassium: 4.8 mmol/L (ref 3.5–5.2)
Sodium: 134 mmol/L (ref 134–144)

## 2019-01-19 LAB — TSH: TSH: 1.57 u[IU]/mL (ref 0.450–4.500)

## 2019-01-19 LAB — HEPATIC FUNCTION PANEL
ALT: 18 IU/L (ref 0–44)
AST: 25 IU/L (ref 0–40)
Albumin: 4.4 g/dL (ref 3.7–4.7)
Alkaline Phosphatase: 55 IU/L (ref 39–117)
Bilirubin Total: 0.5 mg/dL (ref 0.0–1.2)
Bilirubin, Direct: 0.14 mg/dL (ref 0.00–0.40)
Total Protein: 6.9 g/dL (ref 6.0–8.5)

## 2019-01-19 MED ORDER — TECHNETIUM TC 99M TETROFOSMIN IV KIT
32.7000 | PACK | Freq: Once | INTRAVENOUS | Status: AC | PRN
  Administered 2019-01-19: 32.7 via INTRAVENOUS

## 2019-01-19 MED ORDER — TECHNETIUM TC 99M TETROFOSMIN IV KIT
11.0000 | PACK | Freq: Once | INTRAVENOUS | Status: AC | PRN
  Administered 2019-01-19: 11 via INTRAVENOUS

## 2019-01-19 NOTE — Telephone Encounter (Signed)
Called patient and left detailed voice message on patients phone regarding test results. 

## 2019-01-19 NOTE — Telephone Encounter (Signed)
-----   Message from Jenean Lindau, MD sent at 01/19/2019  1:30 PM EDT ----- The results of the study is unremarkable. Please inform patient. I will discuss in detail at next appointment. Cc  primary care/referring physician Jenean Lindau, MD 01/19/2019 1:30 PM

## 2019-01-20 ENCOUNTER — Telehealth: Payer: Self-pay

## 2019-01-20 NOTE — Telephone Encounter (Signed)
-----   Message from Jenean Lindau, MD sent at 01/20/2019  9:02 AM EDT ----- The results of the study is unremarkable. Please inform patient. I will discuss in detail at next appointment. Cc  primary care/referring physician Jenean Lindau, MD 01/20/2019 9:02 AM

## 2019-01-20 NOTE — Telephone Encounter (Signed)
Patient called and notified of lab results. 

## 2019-01-21 ENCOUNTER — Telehealth: Payer: Self-pay

## 2019-01-21 ENCOUNTER — Telehealth: Payer: Self-pay | Admitting: Cardiology

## 2019-01-21 DIAGNOSIS — E782 Mixed hyperlipidemia: Secondary | ICD-10-CM

## 2019-01-21 MED ORDER — ATORVASTATIN CALCIUM 40 MG PO TABS: 40.0000 mg | ORAL_TABLET | Freq: Every day | ORAL | 3 refills | Status: DC

## 2019-01-21 NOTE — Telephone Encounter (Signed)
New med script

## 2019-01-21 NOTE — Telephone Encounter (Signed)
Lipitor 10 mg daily and liver lipid check in 6 weeks

## 2019-01-21 NOTE — Addendum Note (Signed)
Addended by: Beckey Rutter on: 01/21/2019 05:00 PM   Modules accepted: Orders

## 2019-01-21 NOTE — Telephone Encounter (Signed)
Patient has decided that he will try a statin and he wanted to let you know ans PCP will call that in.

## 2019-01-21 NOTE — Telephone Encounter (Signed)
rx for atorvastatin sent and repeat lipid panel ordered. VM left for patient.

## 2019-01-26 NOTE — Telephone Encounter (Signed)
Patient called back and cant take the Lipitor but can take the Pravastatin. Please call n [facility]ravastatin to the [facility] [address]

## 2019-01-27 ENCOUNTER — Other Ambulatory Visit: Payer: Self-pay

## 2019-01-27 MED ORDER — PRAVASTATIN SODIUM 10 MG PO TABS: 10.0000 mg | ORAL_TABLET | Freq: Every day | ORAL | 3 refills | Status: DC

## 2019-01-27 NOTE — Telephone Encounter (Signed)
Patient wanted to change cholesterol med. [facility] script [facility] and atorvastatin was discontinued per discussion with Dr. Docia Furl.

## 2019-02-06 ENCOUNTER — Other Ambulatory Visit: Payer: Self-pay | Admitting: Cardiology

## 2019-02-06 DIAGNOSIS — I2510 Atherosclerotic heart disease of native coronary artery without angina pectoris: Secondary | ICD-10-CM

## 2019-02-06 DIAGNOSIS — E782 Mixed hyperlipidemia: Secondary | ICD-10-CM

## 2019-02-06 DIAGNOSIS — I10 Essential (primary) hypertension: Secondary | ICD-10-CM

## 2019-03-25 DIAGNOSIS — D649 Anemia, unspecified: Secondary | ICD-10-CM | POA: Diagnosis not present

## 2019-04-08 ENCOUNTER — Other Ambulatory Visit: Payer: Self-pay | Admitting: Cardiology

## 2019-05-04 ENCOUNTER — Other Ambulatory Visit: Payer: Self-pay | Admitting: Cardiology

## 2019-05-04 DIAGNOSIS — I10 Essential (primary) hypertension: Secondary | ICD-10-CM

## 2019-05-04 DIAGNOSIS — E782 Mixed hyperlipidemia: Secondary | ICD-10-CM

## 2019-05-04 DIAGNOSIS — I2510 Atherosclerotic heart disease of native coronary artery without angina pectoris: Secondary | ICD-10-CM

## 2019-05-04 NOTE — Telephone Encounter (Signed)
Plavix refill sent to [facility] on [address] [place]

## 2019-06-23 ENCOUNTER — Ambulatory Visit: Payer: Medicare HMO | Admitting: Cardiology

## 2019-07-04 ENCOUNTER — Other Ambulatory Visit: Payer: Self-pay | Admitting: Cardiology

## 2019-07-26 DIAGNOSIS — D649 Anemia, unspecified: Secondary | ICD-10-CM | POA: Diagnosis not present

## 2019-10-18 ENCOUNTER — Other Ambulatory Visit: Payer: Self-pay | Admitting: Cardiology

## 2019-11-12 ENCOUNTER — Other Ambulatory Visit: Payer: Self-pay | Admitting: Cardiology

## 2019-11-12 DIAGNOSIS — E782 Mixed hyperlipidemia: Secondary | ICD-10-CM

## 2019-11-12 DIAGNOSIS — I2510 Atherosclerotic heart disease of native coronary artery without angina pectoris: Secondary | ICD-10-CM

## 2019-11-12 DIAGNOSIS — I10 Essential (primary) hypertension: Secondary | ICD-10-CM

## 2019-11-17 ENCOUNTER — Ambulatory Visit: Payer: Medicare Other | Attending: Internal Medicine

## 2019-11-17 DIAGNOSIS — Z23 Encounter for immunization: Secondary | ICD-10-CM | POA: Insufficient documentation

## 2019-11-17 NOTE — Progress Notes (Signed)
   Covid-19 Vaccination Clinic  Name:  Brian Dunn    MRN: [id number] DOB: 05-22-1941  11/17/2019  [name] was observed post Covid-19 immunization for 30 minutes based on pre-vaccination screening without incidence. He was provided with Vaccine Information Sheet and instruction to access the V-Safe system.   [name] was instructed to call 911 with any severe reactions post vaccine: Marland Kitchen Difficulty breathing  . Swelling of your face and throat  . A fast heartbeat  . A bad rash all over your body  . Dizziness and weakness    Immunizations Administered    Name Date Dose VIS Date Route   Pfizer COVID-19 Vaccine 11/17/2019 11:19 AM 0.3 mL 10/15/2019 Intramuscular   Manufacturer: [employer], [employer]   Lot: [id number]   [place]: [id number]

## 2019-11-22 ENCOUNTER — Other Ambulatory Visit: Payer: Self-pay | Admitting: Cardiology

## 2019-11-30 ENCOUNTER — Other Ambulatory Visit: Payer: Self-pay | Admitting: Cardiology

## 2019-11-30 DIAGNOSIS — I10 Essential (primary) hypertension: Secondary | ICD-10-CM

## 2019-11-30 DIAGNOSIS — E782 Mixed hyperlipidemia: Secondary | ICD-10-CM

## 2019-11-30 DIAGNOSIS — I2510 Atherosclerotic heart disease of native coronary artery without angina pectoris: Secondary | ICD-10-CM

## 2019-12-08 ENCOUNTER — Ambulatory Visit: Payer: Medicare HMO | Attending: Internal Medicine

## 2019-12-08 DIAGNOSIS — Z23 Encounter for immunization: Secondary | ICD-10-CM

## 2019-12-08 NOTE — Progress Notes (Signed)
   Covid-19 Vaccination Clinic  Name:  MIHRAN LEIBA    MRN: [id number] DOB: [birth year]  12/08/2019  Mr. Markiewicz was observed post Covid-19 immunization for 15 minutes without incidence. He was provided with Vaccine Information Sheet and instruction to access the V-Safe system.   Mr. Linarez was instructed to call 911 with any severe reactions post vaccine: Marland Kitchen Difficulty breathing  . Swelling of your face and throat  . A fast heartbeat  . A bad rash all over your body  . Dizziness and weakness    Immunizations Administered    Name Date Dose VIS Date Route   Pfizer COVID-19 Vaccine 12/08/2019  8:43 AM 0.3 mL 10/15/2019 Intramuscular   Manufacturer: [place]   Lot: [id number]   [place]: [id number]    .covid

## 2019-12-17 ENCOUNTER — Other Ambulatory Visit: Payer: Self-pay

## 2019-12-17 MED ORDER — PRAVASTATIN SODIUM 10 MG PO TABS: 10.0000 mg | ORAL_TABLET | Freq: Every day | ORAL | 0 refills | Status: DC

## 2019-12-28 IMAGING — DX DG CHEST 2V
2 series · 3 of 3 positions shown · non-contrast
Comparison: CT chest dated December 08, 2017. Chest x-ray dated March 09, 2015.

CLINICAL DATA: Pneumonia follow-up.

EXAM:
CHEST - 2 VIEW

[chest pa]
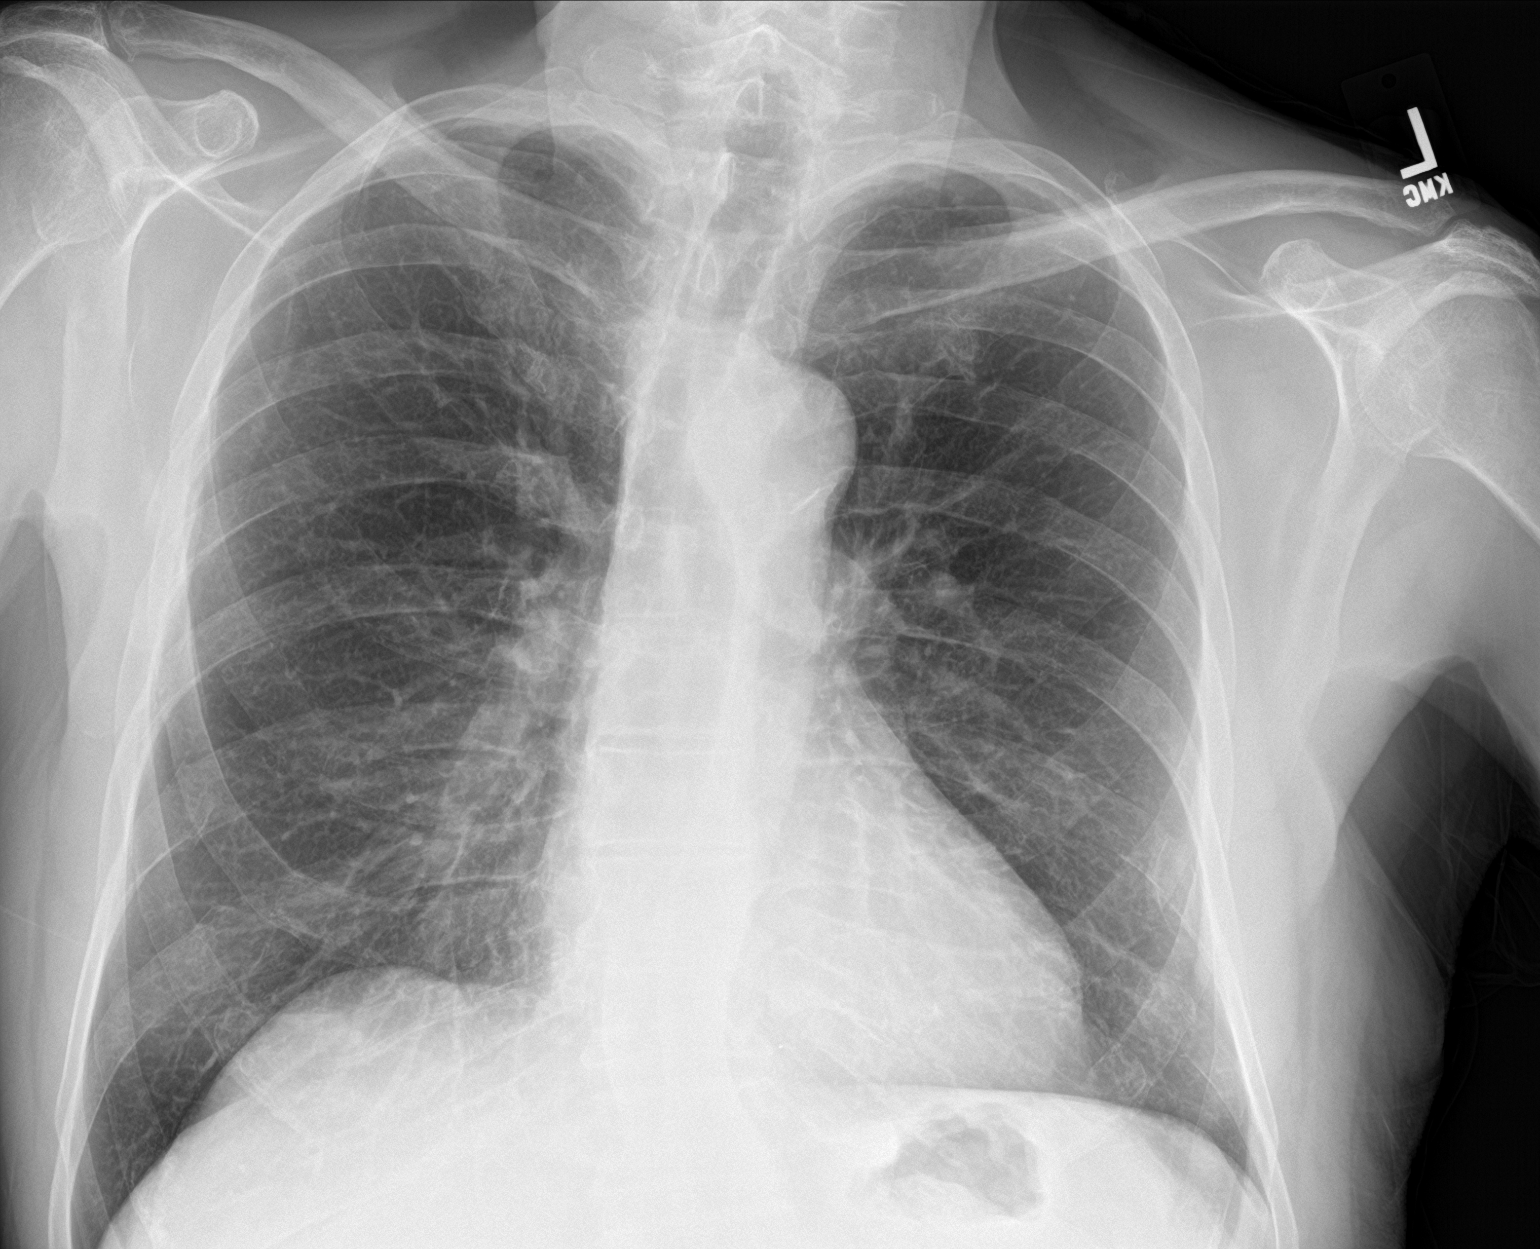

[Series 2: chest lat · 0.14mm/px · 2 of 2 slices shown]
[im 1/2]
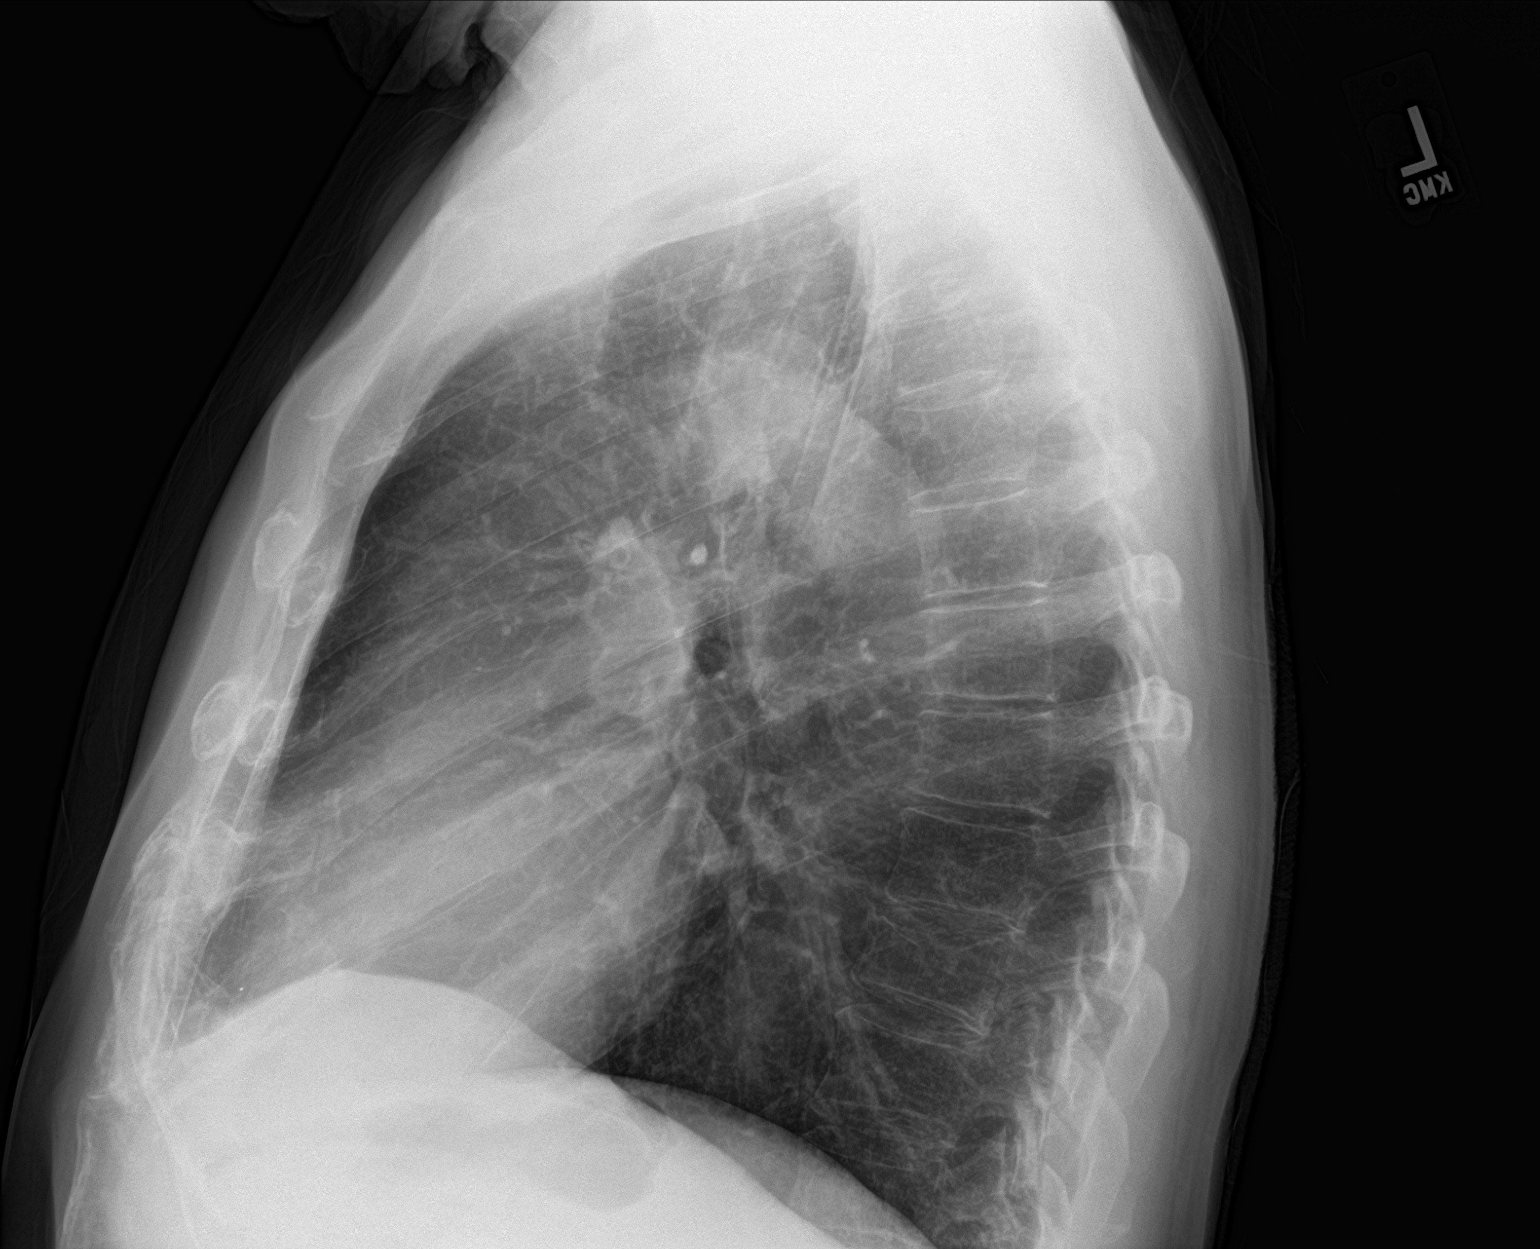
[im 2/2]
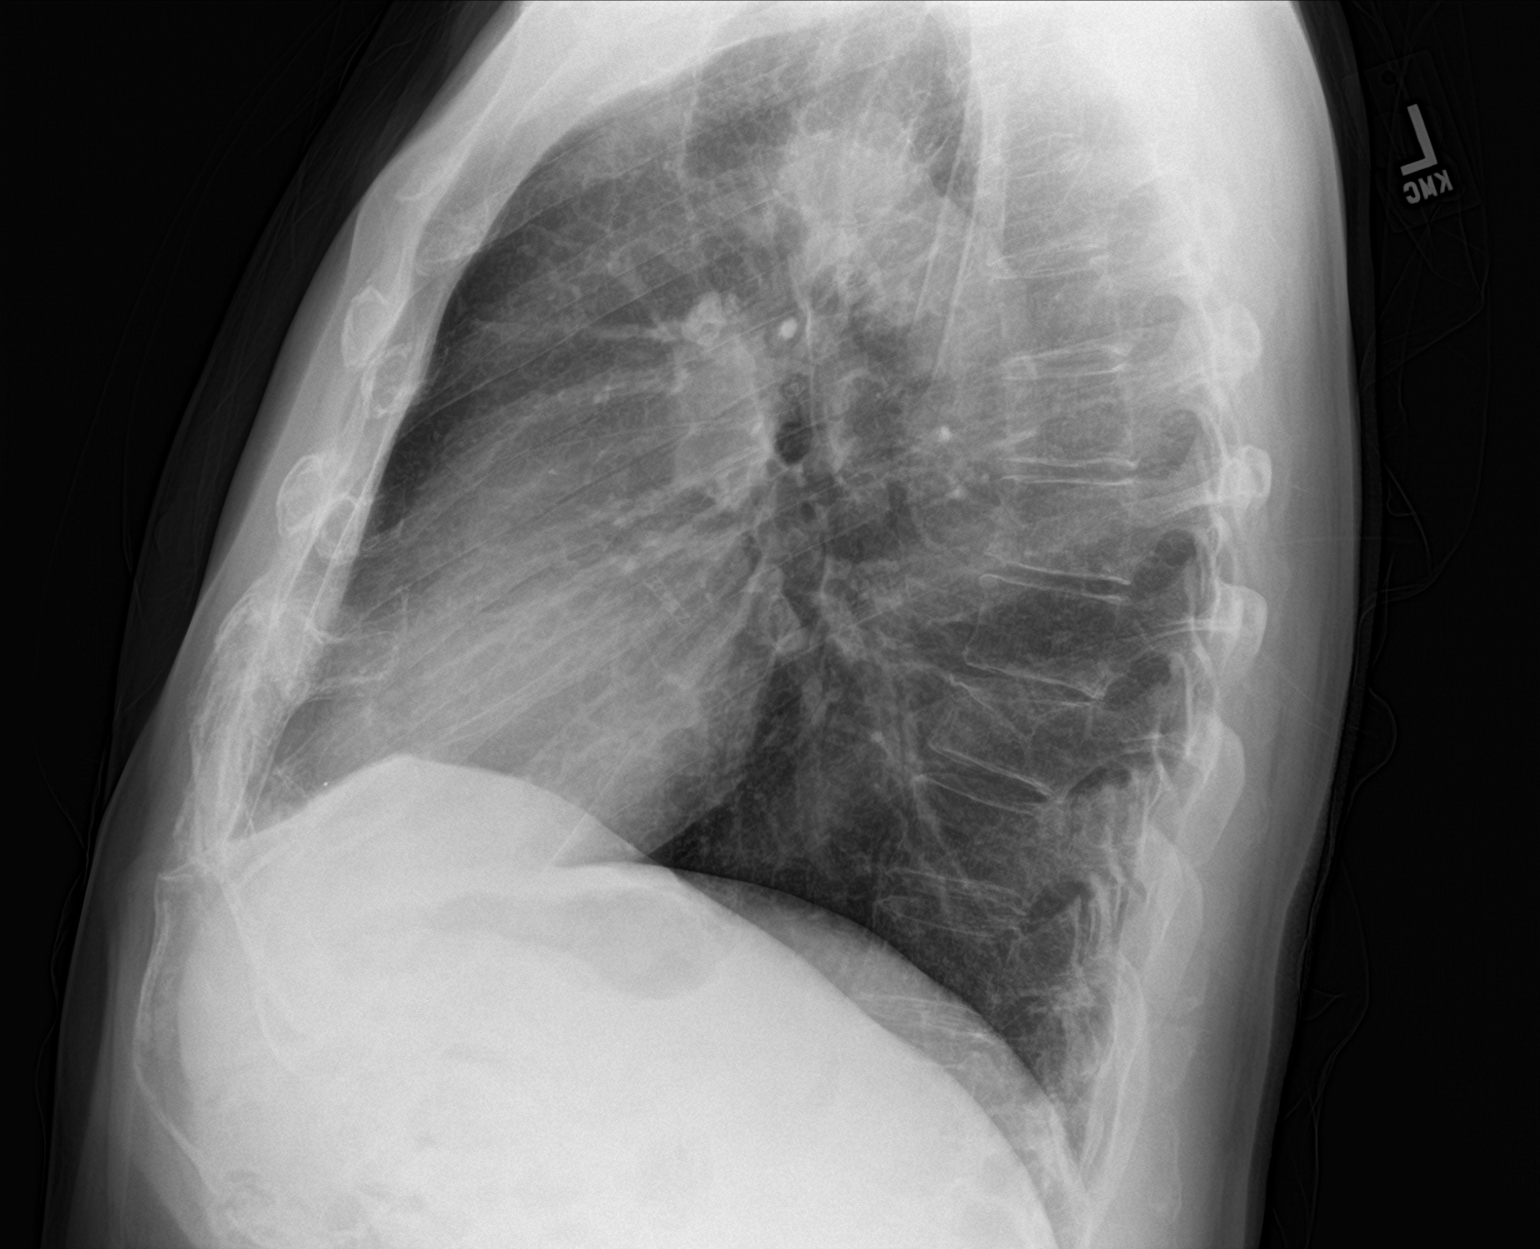

[3 of 3 positions shown; findings below may reference images not displayed]

FINDINGS: The heart size and mediastinal contours are within normal limits.
Normal pulmonary vascularity. No focal consolidation, pleural
effusion, or pneumothorax. No acute osseous abnormality. Unchanged
mild compression deformity of T9.
IMPRESSION: No active cardiopulmonary disease.

## 2019-12-30 ENCOUNTER — Other Ambulatory Visit: Payer: Self-pay

## 2019-12-30 DIAGNOSIS — I2510 Atherosclerotic heart disease of native coronary artery without angina pectoris: Secondary | ICD-10-CM

## 2019-12-30 DIAGNOSIS — I10 Essential (primary) hypertension: Secondary | ICD-10-CM

## 2019-12-30 DIAGNOSIS — E782 Mixed hyperlipidemia: Secondary | ICD-10-CM

## 2019-12-30 MED ORDER — CLOPIDOGREL BISULFATE 75 MG PO TABS: 75.0000 mg | ORAL_TABLET | Freq: Every day | ORAL | 0 refills | Status: DC

## 2020-01-12 ENCOUNTER — Encounter: Payer: Self-pay | Admitting: Cardiology

## 2020-01-12 ENCOUNTER — Other Ambulatory Visit: Payer: Self-pay | Admitting: Cardiology

## 2020-01-12 ENCOUNTER — Ambulatory Visit: Payer: Medicare HMO | Admitting: Cardiology

## 2020-01-12 ENCOUNTER — Other Ambulatory Visit: Payer: Self-pay

## 2020-01-12 VITALS — BP 150/86 | HR 64 | Ht 71.0 in | Wt 169.0 lb

## 2020-01-12 DIAGNOSIS — E782 Mixed hyperlipidemia: Secondary | ICD-10-CM

## 2020-01-12 DIAGNOSIS — Z1329 Encounter for screening for other suspected endocrine disorder: Secondary | ICD-10-CM | POA: Diagnosis not present

## 2020-01-12 DIAGNOSIS — I2510 Atherosclerotic heart disease of native coronary artery without angina pectoris: Secondary | ICD-10-CM

## 2020-01-12 DIAGNOSIS — I10 Essential (primary) hypertension: Secondary | ICD-10-CM | POA: Diagnosis not present

## 2020-01-12 MED ORDER — HYDROCHLOROTHIAZIDE 25 MG PO TABS: 25.0000 mg | ORAL_TABLET | Freq: Every day | ORAL | 3 refills | Status: DC

## 2020-01-12 MED ORDER — ASPIRIN EC 81 MG PO TBEC: 81.0000 mg | DELAYED_RELEASE_TABLET | Freq: Every day | ORAL | 3 refills | Status: DC

## 2020-01-12 NOTE — Patient Instructions (Signed)
Medication Instructions:  Your physician has recommended you make the following change in your medication:   Start taking 25 mg of you Hydrochlorothiazide. Stop the Plavix once you finish the current prescripition then start 81 mg Aspirin daily.  *If you need a refill on your cardiac medications before your next appointment, please call your pharmacy*   Lab Work: You had labs today If you have labs (blood work) drawn today and your tests are completely normal, you will receive your results only by: Marland Kitchen MyChart Message (if you have MyChart) OR . A paper copy in the mail If you have any lab test that is abnormal or we need to change your treatment, we will call you to review the results.   Testing/Procedures: You had an EKG today   Follow-Up: At [facility], you and your health needs are our priority.  As part of our continuing mission to provide you with exceptional heart care, we have created designated Provider Care Teams.  These Care Teams include your primary Cardiologist (physician) and Advanced Practice Providers (APPs -  Physician Assistants and Nurse Practitioners) who all work together to provide you with the care you need, when you need it.  We recommend signing up for the patient portal called "MyChart".  Sign up information is provided on this After Visit Summary.  MyChart is used to connect with patients for Virtual Visits (Telemedicine).  Patients are able to view lab/test results, encounter notes, upcoming appointments, etc.  Non-urgent messages can be sent to your provider as well.   To learn more about what you can do with MyChart, go to [email or web address].    Your next appointment:   6 month(s)  The format for your next appointment:   In Person  Provider:   Jyl Heinz, MD   Other Instructions  Potassium Content of Foods  Potassium is a mineral found in many foods and drinks. It affects how the heart works, and helps keep fluids and minerals balanced  in the body. The amount of potassium you need each day depends on your age and any medical conditions you may have. Talk to your health care provider or dietitian about how much potassium you need. The following lists of foods provide the general serving size for foods and the approximate amount of potassium in each serving, listed in milligrams (mg). Actual values may vary depending on the product and how it is processed. High in potassium The following foods and beverages have 200 mg or more of potassium per serving:  Apricots (raw) - 2 have 200 mg of potassium.  Apricots (dry) - 5 have 200 mg of potassium.  Artichoke - 1 medium has 345 mg of potassium.  Avocado -  fruit has 245 mg of potassium.  Banana - 1 medium fruit has 425 mg of potassium.  [place] or baked beans (canned) -  cup has 280 mg of potassium.  White beans (canned) -  cup has 595 mg potassium.  Beef roast - 3 oz has 320 mg of potassium.  Ground beef - 3 oz has 270 mg of potassium.  Beets (raw or cooked) -  cup has 260 mg of potassium.  Bran muffin - 2 oz has 300 mg of potassium.  Broccoli (cooked) -  cup has 230 mg of potassium.  Brussels sprouts -  cup has 250 mg of potassium.  Cantaloupe -  cup has 215 mg of potassium.  Cereal, 100% bran -  cup has 200-400 mg of potassium.  Cheeseburger -1  single fast food burger has 225-400 mg of potassium.  Chicken - 3 oz has 220 mg of potassium.  Clams (canned) - 3 oz has 535 mg of potassium.  Crab - 3 oz has 225 mg of potassium.  Dates - 5 have 270 mg of potassium.  Dried beans and peas -  cup has 300-475 mg of potassium.  Figs (dried) - 2 have 260 mg of potassium.  Fish (halibut, tuna, cod, snapper) - 3 oz has 480 mg of potassium.  Fish (salmon, haddock, swordfish, perch) - 3 oz has 300 mg of potassium.  Fish (tuna, canned) - 3 oz has 200 mg of potassium.  [place] fries (fast food) - 3 oz has 470 mg of potassium.  Granola with fruit and nuts -   cup has 200 mg of potassium.  Grapefruit juice -  cup has 200 mg of potassium.  Honeydew melon -  cup has 200 mg of potassium.  Kale (raw) - 1 cup has 300 mg of potassium.  Kiwi - 1 medium fruit has 240 mg of potassium.  Kohlrabi, rutabaga, parsnips -  cup has 280 mg of potassium.  Lentils -  cup has 365 mg of potassium.  Mango - 1 each has 325 mg of potassium.  Milk (nonfat, low-fat, whole, buttermilk) - 1 cup has 350-380 mg of potassium.  Milk (chocolate) - 1 cup has 420 mg of potassium  Molasses - 1 Tbsp has 295 mg of potassium.  Mushrooms -  cup has 280 mg of potassium.  Nectarine - 1 each has 275 mg of potassium.  Nuts (almonds, peanuts, hazelnuts, [place], cashew, mixed) - 1 oz has 200 mg of potassium.  Nuts (pistachios) - 1 oz has 295 mg of potassium.  Orange - 1 fruit has 240 mg of potassium.  Orange juice -  cup has 235 mg of potassium.  Papaya -  medium fruit has 390 mg of potassium.  Peanut butter (chunky) - 2 Tbsp has 240 mg of potassium.  Peanut butter (smooth) - 2 Tbsp has 210 mg of potassium.  Pear - 1 medium (200 mg) of potassium.  Pomegranate - 1 whole fruit has 400 mg of potassium.  Pomegranate juice -  cup has 215 mg of potassium.  Pork - 3 oz has 350 mg of potassium.  Potato chips (salted) - 1 oz has 465 mg of potassium.  Potato (baked with skin) - 1 medium has 925 mg of potassium.  Potato (boiled) -  cup has 255 mg of potassium.  Potato (Mashed) -  cup has 330 mg of potassium.  Prune juice -  cup has 370 mg of potassium.  Prunes - 5 have 305 mg of potassium.  Pudding (chocolate) -  cup has 230 mg of potassium.  Pumpkin (canned) -  cup has 250 mg of potassium.  Raisins (seedless) -  cup has 270 mg of potassium.  Seeds (sunflower or pumpkin) - 1 oz has 240 mg of potassium.  Soy milk - 1 cup has 300 mg of potassium.  Spinach (cooked) - 1/2 cup has 420 mg of potassium.  Spinach (canned) -  cup has 370 mg of  potassium.  Sweet potato (baked with skin) - 1 medium has 450 mg of potassium.  Swiss chard -  cup has 480 mg of potassium.  Tomato or vegetable juice -  cup has 275 mg of potassium.  Tomato (sauce or puree) -  cup has 400-550 mg of potassium.  Tomato (raw) - 1 medium has  290 mg of potassium.  Tomato (canned) -  cup has 200-300 mg of potassium.  [place] - 3 oz has 250 mg of potassium.  Wheat germ - 1 oz has 250 mg of potassium.  Winter squash -  cup has 250 mg of potassium.  Yogurt (plain or fruited) - 6 oz has 260-435 mg of potassium.  Zucchini -  cup has 220 mg of potassium. Moderate in potassium The following foods and beverages have 50-200 mg of potassium per serving:  Apple - 1 fruit has 150 mg of potassium  Apple juice -  cup has 150 mg of potassium  Applesauce -  cup has 90 mg of potassium  Apricot nectar -  cup has 140 mg of potassium  Asparagus (small spears) -  cup has 155 mg of potassium  Asparagus (large spears) - 6 have 155 mg of potassium  Bagel (cinnamon raisin) - 1 four-inch bagel has 130 mg of potassium  Bagel (egg or plain) - 1 four- inch bagel has 70 mg of potassium  Beans (green) -  cup has 90 mg of potassium  Beans (yellow) -  cup has 190 mg of potassium  Beer, regular - 12 oz has 100 mg of potassium  Beets (canned) -  cup has 125 mg of potassium  Blackberries -  cup has 115 mg of potassium  Blueberries -  cup has 60 mg of potassium  Bread (whole wheat) - 1 slice has 70 mg of potassium  Broccoli (raw) -  cup has 145 mg of potassium  Cabbage -  cup has 150 mg of potassium  Carrots (cooked or raw) -  cup has 180 mg of potassium  Cauliflower (raw) -  cup has 150 mg of potassium  Celery (raw) -  cup has 155 mg of potassium  Cereal, bran flakes -  cup has 120-150 mg of potassium  Cheese (cottage) -  cup has 110 mg of potassium  Cherries - 10 have 150 mg of potassium  Chocolate - 1 oz bar has 165 mg of  potassium  Coffee (brewed) - 6 oz has 90 mg of potassium  Corn -  cup or 1 ear has 195 mg of potassium  Cucumbers -  cup has 80 mg of potassium  Egg - 1 large egg has 60 mg of potassium  Eggplant -  cup has 60 mg of potassium  Endive (raw) -  cup has 80 mg of potassium  English muffin - 1 has 65 mg of potassium  Fish (ocean perch) - 3 oz has 192 mg of potassium  Frankfurter, beef or pork - 1 has 75 mg of potassium  Fruit cocktail -  cup has 115 mg of potassium  Grape juice -  cup has 170 mg of potassium  Grapefruit -  fruit has 175 mg of potassium  Grapes -  cup has 155 mg of potassium  Greens: kale, turnip, collard -  cup has 110-150 mg of potassium  Ice cream or frozen yogurt (chocolate) -  cup has 175 mg of potassium  Ice cream or frozen yogurt (vanilla) -  cup has 120-150 mg of potassium  Lemons, limes - 1 each has 80 mg of potassium  Lettuce - 1 cup has 100 mg of potassium  Mixed vegetables -  cup has 150 mg of potassium  Mushrooms, raw -  cup has 110 mg of potassium  Nuts (walnuts, pecans, or macadamia) - 1 oz has 125 mg of potassium  Oatmeal -  cup has 80 mg of potassium  Okra -  cup has 110 mg of potassium  Onions -  cup has 120 mg of potassium  Peach - 1 has 185 mg of potassium  Peaches (canned) -  cup has 120 mg of potassium  Pears (canned) -  cup has 120 mg of potassium  Peas, green (frozen) -  cup has 90 mg of potassium  Peppers (Green) -  cup has 130 mg of potassium  Peppers (Red) -  cup has 160 mg of potassium  Pineapple juice -  cup has 165 mg of potassium  Pineapple (fresh or canned) -  cup has 100 mg of potassium  Plums - 1 has 105 mg of potassium  Pudding, vanilla -  cup has 150 mg of potassium  Raspberries -  cup has 90 mg of potassium  Rhubarb -  cup has 115 mg of potassium  Rice, wild -  cup has 80 mg of potassium  Shrimp - 3 oz has 155 mg of potassium  Spinach (raw) - 1 cup has 170 mg of  potassium  Strawberries -  cup has 125 mg of potassium  Summer squash -  cup has 175-200 mg of potassium  Swiss chard (raw) - 1 cup has 135 mg of potassium  Tangerines - 1 fruit has 140 mg of potassium  Tea, brewed - 6 oz has 65 mg of potassium  Turnips -  cup has 140 mg of potassium  Watermelon -  cup has 85 mg of potassium  Wine (Red, table) - 5 oz has 180 mg of potassium  Wine (White, table) - 5 oz 100 mg of potassium Low in potassium The following foods and beverages have less than 50 mg of potassium per serving.  Bread (white) - 1 slice has 30 mg of potassium  Carbonated beverages - 12 oz has less than 5 mg of potassium  Cheese - 1 oz has 20-30 mg of potassium  Cranberries -  cup has 45 mg of potassium  Cranberry juice cocktail -  cup has 20 mg of potassium  Fats and oils - 1 Tbsp has less than 5 mg of potassium  Hummus - 1 Tbsp has 32 mg of potassium  Nectar (papaya, mango, or pear) -  cup has 35 mg of potassium  Rice (white or brown) -  cup has 50 mg of potassium  Spaghetti or macaroni (cooked) -  cup has 30 mg of potassium  Tortilla, flour or corn - 1 has 50 mg of potassium  Waffle - 1 four-inch waffle has 50 mg of potassium  Water chestnuts -  cup has 40 mg of potassium Summary  Potassium is a mineral found in many foods and drinks. It affects how the heart works, and helps keep fluids and minerals balanced in the body.  The amount of potassium you need each day depends on your age and any existing medical conditions you may have. Your health care provider or dietitian may recommend an amount of potassium that you should have each day. This information is not intended to replace advice given to you by your health care provider. Make sure you discuss any questions you have with your health care provider. Document Revised: 10/03/2017 Document Reviewed: 01/15/2017 Elsevier Patient Education  [place].

## 2020-01-12 NOTE — Progress Notes (Signed)
Cardiology Office Note:    Date:  01/12/2020   ID:  HAU HUDECEK, DOB [birth year], MRN [id number]  PCP:  Angelina Sheriff, MD  Cardiologist:  Jenean Lindau, MD   Referring MD: Angelina Sheriff, MD    ASSESSMENT:    1. Coronary artery disease involving native coronary artery of native heart without angina pectoris   2. Essential hypertension   3. Mixed hyperlipidemia    PLAN:    In order of problems listed above:  1. Coronary artery disease: Secondary prevention stressed with the patient.  Importance of compliance with diet medication stressed and he vocalized understanding.  Importance of regular exercise stressed I told him to walk to the best of his ability.  He also wants to stop clopidogrel and switch it to aspirin and I am fine with it.  I told him the exact way of doing it without any interruption.  He promises to do so.  He tells me that he is when clopidogrel is over the very next day he will start with coated aspirin on a daily basis.  There will be no interruption.  Essential hypertension Essential hypertension: Blood pressure is elevated and advised him to increase his hydrochlorothiazide to 25 mg daily.  He will have blood work today including fasting lipids.  He is due to see his primary care physician in the next week or 2 and he will have follow-up blood pressure check there with him and get follow-up Chem-7 also.  Potassium supplementation and diet was advised.  Mixed dyslipidemia: We will check blood work today as he is fasting and give him his refill of his medications. Patient will be seen in follow-up appointment in 6 months or earlier if the patient has any concerns   Medication Adjustments/Labs and Tests Ordered: Current medicines are reviewed at length with the patient today.  Concerns regarding medicines are outlined above.  No orders of the defined types were placed in this encounter.  No orders of the defined types were placed in this  encounter.    Chief Complaint  Patient presents with  . Follow-up     History of Present Illness:    Brian Dunn is a 79 y.o. male.  Patient has history of coronary artery disease and stenting in 2003.  He denies any problems at this time and takes care of activities of daily living.  No chest pain orthopnea or PND.  At the time of my evaluation, the patient is alert awake oriented and in no distress.  He does not ambulate much because of orthopedic issues.  Past Medical History:  Diagnosis Date  . Allergic rhinitis   . Chest pain, unspecified   . COPD (chronic obstructive pulmonary disease) ([place])   . Essential and other specified forms of tremor   . GERD (gastroesophageal reflux disease)   . Hip fracture ([place])   . Hypertension   . Hypogonadism male   . Osteopenia   . Personal history of malignant neoplasm of prostate     Past Surgical History:  Procedure Laterality Date  . CORONARY ANGIOPLASTY WITH STENT PLACEMENT  2008  . INSERTION PROSTATE RADIATION SEED      Current Medications: Current Meds  Medication Sig  . albuterol (PROAIR HFA) 108 (90 BASE) MCG/ACT inhaler Inhale 2 puffs into the lungs every 6 (six) hours as needed for wheezing.  Marland Kitchen allopurinol (ZYLOPRIM) 100 MG tablet Take 1 tablet by mouth daily.   . Ascorbic Acid (VITAMIN C)  1000 MG tablet Take 500 mg by mouth.  . budesonide-formoterol (SYMBICORT) 160-4.5 MCG/ACT inhaler Inhale 2 puffs into the lungs 2 (two) times daily.  . Calcium Carbonate-Vitamin D (CALCIUM 600 + D PO) Take by mouth daily.  . celecoxib (CELEBREX) 200 MG capsule Take 200 mg by mouth daily.   . clopidogrel (PLAVIX) 75 MG tablet Take 1 tablet (75 mg total) by mouth daily.  . Coenzyme Q10 (CO Q-10) 75 MG CAPS Take 120 mg by mouth daily.   . cyanocobalamin 50 MCG tablet Take 500 mcg by mouth daily.   . famotidine (PEPCID) 20 MG tablet Take 20 mg by mouth daily.   Marland Kitchen FERROUS SULFATE DRIED PO Take by mouth.  . fluticasone (FLONASE) 50 MCG/ACT  nasal spray 2 sprays by Nasal route daily.  . hydrochlorothiazide (HYDRODIURIL) 12.5 MG tablet Take 12.5 mg by mouth daily.  . irbesartan (AVAPRO) 300 MG tablet Take 300 mg by mouth daily.   Marland Kitchen loratadine (CLARITIN) 10 MG tablet Take 10 mg by mouth daily.   . Magnesium 400 MG CAPS Take by mouth. 3 tabs daily  . Multiple Vitamin (MULTIVITAMIN) capsule Take 1 capsule by mouth daily.    . nitroGLYCERIN (NITROSTAT) 0.4 MG SL tablet Place 1 tablet (0.4 mg total) under the tongue every 5 (five) minutes as needed for chest pain.  . pravastatin (PRAVACHOL) 10 MG tablet Take 1 tablet (10 mg total) by mouth daily. Please call to schedule appointment for further refills.  . primidone (MYSOLINE) 50 MG tablet Take 50 mg by mouth at bedtime.   . Tamsulosin HCl (FLOMAX) 0.4 MG CAPS Take 0.4 mg by mouth as needed.      Allergies:   Metoprolol, Morphine, Penicillin g, Penicillins, and Propranolol   Social History   Socioeconomic History  . Marital status: Married    Spouse name: Not on file  . Number of children: Not on file  . Years of education: Not on file  . Highest education level: Not on file  Occupational History  . Not on file  Tobacco Use  . Smoking status: Former Smoker    Packs/day: 1.00    Years: 35.00    Pack years: 35.00    Types: Cigarettes    Quit date: 11/04/1996    Years since quitting: 23.2  . Smokeless tobacco: Never Used  Substance and Sexual Activity  . Alcohol use: Not on file    Comment: ETOH daily  . Drug use: Yes  . Sexual activity: Not on file  Other Topics Concern  . Not on file  Social History Narrative   Married and has children.  Works in Press [employer].   Social Determinants of Health   Financial Resource Strain:   . Difficulty of Paying Living Expenses: Not on file  Food Insecurity:   . Worried About Charity fundraiser in the Last Year: Not on file  . Ran Out of Food in the Last Year: Not on file  Transportation Needs:   . Lack of Transportation (Medical): Not  on file  . Lack of Transportation (Non-Medical): Not on file  Physical Activity:   . Days of Exercise per Week: Not on file  . Minutes of Exercise per Session: Not on file  Stress:   . Feeling of Stress : Not on file  Social Connections:   . Frequency of Communication with Friends and Family: Not on file  . Frequency of Social Gatherings with Friends and Family: Not on file  . Attends Religious Services:  Not on file  . Active Member of Clubs or Organizations: Not on file  . Attends Archivist Meetings: Not on file  . Marital Status: Not on file     Family History: The patient's family history includes Asthma in his father and sister; Heart disease in his mother and sister; Parkinsonism in his father; Throat cancer in his sister.  ROS:   Please see the history of present illness.    All other systems reviewed and are negative.  EKGs/Labs/Other Studies Reviewed:    The following studies were reviewed today: EKG reveals sinus rhythm and nonspecific ST-T changes   Recent Labs: 01/19/2019: ALT 18; BUN 16; Creatinine, Ser 1.26; Hemoglobin 11.8; Platelets 226; Potassium 4.8; Sodium 134; TSH 1.570  Recent Lipid Panel    Component Value Date/Time   CHOL 184 01/19/2019 1032   TRIG 80 01/19/2019 1032   HDL 69 01/19/2019 1032   CHOLHDL 2.7 01/19/2019 1032   CHOLHDL 2.5 CALC 01/22/2007 0923   VLDL 19 01/22/2007 0923   LDLCALC 99 01/19/2019 1032   LDLDIRECT 117.9 01/22/2007 0923    Physical Exam:    VS:  BP (!) 150/86   Pulse 64   Ht 5\' 11"  (1.803 m)   Wt 169 lb (76.7 kg)   SpO2 98%   BMI 23.57 kg/m     Wt Readings from Last 3 Encounters:  01/12/20 169 lb (76.7 kg)  01/19/19 180 lb (81.6 kg)  12/21/18 180 lb (81.6 kg)     GEN: Patient is in no acute distress HEENT: Normal NECK: No JVD; No carotid bruits LYMPHATICS: No lymphadenopathy CARDIAC: Hear sounds regular, 2/6 systolic murmur at the apex. RESPIRATORY:  Clear to auscultation without rales, wheezing or  rhonchi  ABDOMEN: Soft, non-tender, non-distended MUSCULOSKELETAL:  No edema; No deformity  SKIN: Warm and dry NEUROLOGIC:  Alert and oriented x 3 PSYCHIATRIC:  Normal affect   Signed, Jenean Lindau, MD  01/12/2020 9:38 AM    [facility]

## 2020-01-17 LAB — CBC WITH DIFFERENTIAL/PLATELET
Basophils Absolute: 0 10*3/uL (ref 0.0–0.2)
Basos: 1 %
EOS (ABSOLUTE): 0.2 10*3/uL (ref 0.0–0.4)
Eos: 3 %
Hematocrit: 33.2 % — ABNORMAL LOW (ref 37.5–51.0)
Hemoglobin: 11.5 g/dL — ABNORMAL LOW (ref 13.0–17.7)
Immature Grans (Abs): 0 10*3/uL (ref 0.0–0.1)
Immature Granulocytes: 0 %
Lymphocytes Absolute: 1.5 10*3/uL (ref 0.7–3.1)
Lymphs: 29 %
MCH: 34.2 pg — ABNORMAL HIGH (ref 26.6–33.0)
MCHC: 34.6 g/dL (ref 31.5–35.7)
MCV: 99 fL — ABNORMAL HIGH (ref 79–97)
Monocytes Absolute: 0.4 10*3/uL (ref 0.1–0.9)
Monocytes: 8 %
Neutrophils Absolute: 3.1 10*3/uL (ref 1.4–7.0)
Neutrophils: 59 %
Platelets: 258 10*3/uL (ref 150–450)
RBC: 3.36 x10E6/uL — ABNORMAL LOW (ref 4.14–5.80)
RDW: 11.8 % (ref 11.6–15.4)
WBC: 5.3 10*3/uL (ref 3.4–10.8)

## 2020-01-17 LAB — HEPATIC FUNCTION PANEL
ALT: 18 IU/L (ref 0–44)
AST: 32 IU/L (ref 0–40)
Albumin: 4.9 g/dL — ABNORMAL HIGH (ref 3.7–4.7)
Alkaline Phosphatase: 63 IU/L (ref 39–117)
Bilirubin Total: 0.8 mg/dL (ref 0.0–1.2)
Bilirubin, Direct: 0.24 mg/dL (ref 0.00–0.40)
Total Protein: 7.3 g/dL (ref 6.0–8.5)

## 2020-01-17 LAB — LIPID PANEL
Chol/HDL Ratio: 1.8 ratio (ref 0.0–5.0)
Cholesterol, Total: 167 mg/dL (ref 100–199)
HDL: 92 mg/dL (ref >39)
LDL Chol Calc (NIH): 62 mg/dL (ref 0–99)
Triglycerides: 68 mg/dL (ref 0–149)
VLDL Cholesterol Cal: 13 mg/dL (ref 5–40)

## 2020-01-17 LAB — BASIC METABOLIC PANEL
BUN/Creatinine Ratio: 20 (ref 10–24)
BUN: 25 mg/dL (ref 8–27)
CO2: 23 mmol/L (ref 20–29)
Calcium: 9.8 mg/dL (ref 8.6–10.2)
Chloride: 95 mmol/L — ABNORMAL LOW (ref 96–106)
Creatinine, Ser: 1.24 mg/dL (ref 0.76–1.27)
GFR calc Af Amer: 64 mL/min/{1.73_m2} (ref >59)
GFR calc non Af Amer: 55 mL/min/{1.73_m2} — ABNORMAL LOW (ref >59)
Glucose: 90 mg/dL (ref 65–99)
Potassium: 5.5 mmol/L — ABNORMAL HIGH (ref 3.5–5.2)
Sodium: 131 mmol/L — ABNORMAL LOW (ref 134–144)

## 2020-01-17 LAB — TSH: TSH: 1.86 u[IU]/mL (ref 0.450–4.500)

## 2020-01-18 ENCOUNTER — Other Ambulatory Visit: Payer: Self-pay

## 2020-01-18 DIAGNOSIS — E875 Hyperkalemia: Secondary | ICD-10-CM

## 2020-01-18 MED ORDER — KAYEXALATE PO POWD: 15.0000 g | Freq: Once | ORAL | 0 refills | Status: AC

## 2020-01-22 ENCOUNTER — Other Ambulatory Visit: Payer: Self-pay | Admitting: Cardiology

## 2020-01-22 DIAGNOSIS — E782 Mixed hyperlipidemia: Secondary | ICD-10-CM

## 2020-01-22 DIAGNOSIS — I10 Essential (primary) hypertension: Secondary | ICD-10-CM

## 2020-01-22 DIAGNOSIS — I2510 Atherosclerotic heart disease of native coronary artery without angina pectoris: Secondary | ICD-10-CM

## 2020-01-24 ENCOUNTER — Other Ambulatory Visit: Payer: Self-pay

## 2020-01-24 DIAGNOSIS — E875 Hyperkalemia: Secondary | ICD-10-CM

## 2020-01-24 DIAGNOSIS — E871 Hypo-osmolality and hyponatremia: Secondary | ICD-10-CM

## 2020-01-24 DIAGNOSIS — I10 Essential (primary) hypertension: Secondary | ICD-10-CM

## 2020-01-24 LAB — BASIC METABOLIC PANEL
BUN/Creatinine Ratio: 19 (ref 10–24)
BUN: 22 mg/dL (ref 8–27)
CO2: 22 mmol/L (ref 20–29)
Calcium: 9.2 mg/dL (ref 8.6–10.2)
Chloride: 94 mmol/L — ABNORMAL LOW (ref 96–106)
Creatinine, Ser: 1.14 mg/dL (ref 0.76–1.27)
GFR calc Af Amer: 70 mL/min/{1.73_m2} (ref >59)
GFR calc non Af Amer: 61 mL/min/{1.73_m2} (ref >59)
Glucose: 92 mg/dL (ref 65–99)
Potassium: 4.9 mmol/L (ref 3.5–5.2)
Sodium: 130 mmol/L — ABNORMAL LOW (ref 134–144)

## 2020-01-28 DIAGNOSIS — D649 Anemia, unspecified: Secondary | ICD-10-CM | POA: Diagnosis not present

## 2020-02-08 LAB — BASIC METABOLIC PANEL
BUN/Creatinine Ratio: 16 (ref 10–24)
BUN: 19 mg/dL (ref 8–27)
CO2: 23 mmol/L (ref 20–29)
Calcium: 9.4 mg/dL (ref 8.6–10.2)
Chloride: 98 mmol/L (ref 96–106)
Creatinine, Ser: 1.16 mg/dL (ref 0.76–1.27)
GFR calc Af Amer: 69 mL/min/{1.73_m2} (ref >59)
GFR calc non Af Amer: 60 mL/min/{1.73_m2} (ref >59)
Glucose: 93 mg/dL (ref 65–99)
Potassium: 5 mmol/L (ref 3.5–5.2)
Sodium: 135 mmol/L (ref 134–144)

## 2020-02-15 ENCOUNTER — Other Ambulatory Visit: Payer: Self-pay

## 2020-02-16 ENCOUNTER — Ambulatory Visit: Payer: Medicare HMO | Admitting: Cardiology

## 2020-02-16 ENCOUNTER — Encounter: Payer: Self-pay | Admitting: Cardiology

## 2020-02-16 ENCOUNTER — Other Ambulatory Visit: Payer: Self-pay

## 2020-02-16 VITALS — BP 156/70 | HR 66 | Temp 97.2°F | Ht 70.5 in | Wt 167.2 lb

## 2020-02-16 DIAGNOSIS — E782 Mixed hyperlipidemia: Secondary | ICD-10-CM

## 2020-02-16 DIAGNOSIS — I10 Essential (primary) hypertension: Secondary | ICD-10-CM

## 2020-02-16 DIAGNOSIS — I2510 Atherosclerotic heart disease of native coronary artery without angina pectoris: Secondary | ICD-10-CM

## 2020-02-16 LAB — BASIC METABOLIC PANEL
BUN/Creatinine Ratio: 16 (ref 10–24)
BUN: 23 mg/dL (ref 8–27)
CO2: 22 mmol/L (ref 20–29)
Calcium: 9.3 mg/dL (ref 8.6–10.2)
Chloride: 96 mmol/L (ref 96–106)
Creatinine, Ser: 1.43 mg/dL — ABNORMAL HIGH (ref 0.76–1.27)
GFR calc Af Amer: 53 mL/min/{1.73_m2} — ABNORMAL LOW (ref >59)
GFR calc non Af Amer: 46 mL/min/{1.73_m2} — ABNORMAL LOW (ref >59)
Glucose: 90 mg/dL (ref 65–99)
Potassium: 4.8 mmol/L (ref 3.5–5.2)
Sodium: 131 mmol/L — ABNORMAL LOW (ref 134–144)

## 2020-02-16 NOTE — Patient Instructions (Signed)
Medication Instructions:  No medication changes *If you need a refill on your cardiac medications before your next appointment, please call your pharmacy*   Lab Work: Your physician recommends that you have a BMET today.  If you have labs (blood work) drawn today and your tests are completely normal, you will receive your results only by: Marland Kitchen MyChart Message (if you have MyChart) OR . A paper copy in the mail If you have any lab test that is abnormal or we need to change your treatment, we will call you to review the results.   Testing/Procedures: None ordered   Follow-Up: At [facility], you and your health needs are our priority.  As part of our continuing mission to provide you with exceptional heart care, we have created designated Provider Care Teams.  These Care Teams include your primary Cardiologist (physician) and Advanced Practice Providers (APPs -  Physician Assistants and Nurse Practitioners) who all work together to provide you with the care you need, when you need it.  We recommend signing up for the patient portal called "MyChart".  Sign up information is provided on this After Visit Summary.  MyChart is used to connect with patients for Virtual Visits (Telemedicine).  Patients are able to view lab/test results, encounter notes, upcoming appointments, etc.  Non-urgent messages can be sent to your provider as well.   To learn more about what you can do with MyChart, go to [email or web address].    Your next appointment:   4 month(s)  The format for your next appointment:   In Person  Provider:   Jyl Heinz, MD   Other Instructions NA

## 2020-02-16 NOTE — Progress Notes (Signed)
Cardiology Office Note:    Date:  02/16/2020   ID:  Tamala Ser Haviland, DOB Jan 01, 1941, MRN [id number]  PCP:  Angelina Sheriff, MD  Cardiologist:  Jenean Lindau, MD   Referring MD: Angelina Sheriff, MD    ASSESSMENT:    1. Coronary artery disease involving native coronary artery of native heart without angina pectoris   2. Essential hypertension   3. Mixed hyperlipidemia    PLAN:    In order of problems listed above:  1. Coronary artery disease: Secondary prevention stressed with the patient.  Importance of compliance with diet medication stressed and he vocalized understanding. 2. Essential hypertension: Blood pressure stable diet emphasized extensively 3. Mixed dyslipidemia: His lipids are fine 4. History of hyponatremia and hyperkalemia: We will do a Chem-7 today to check these. 5. Patient will be seen in follow-up appointment in 4 months or earlier if the patient has any concerns    Medication Adjustments/Labs and Tests Ordered: Current medicines are reviewed at length with the patient today.  Concerns regarding medicines are outlined above.  No orders of the defined types were placed in this encounter.  No orders of the defined types were placed in this encounter.    No chief complaint on file.    History of Present Illness:    Tayquan Decou is a 79 y.o. male.  Patient has past medical history of coronary artery disease, essential hypertension and dyslipidemia.  He denies any problems at this time and takes care of activities of daily living.  No chest pain orthopnea or PND.  He overall leads a sedentary lifestyle because of orthopedic issues.  At the time of my evaluation, the patient is alert awake oriented and in no distress.  Past Medical History:  Diagnosis Date  . Acute bronchitis 05/29/2012  . Allergic rhinitis   . ALLERGIC RHINITIS 01/22/2007   Qualifier: Diagnosis of  By: Larose Kells MD, [place] 06/18/2012   Followed in Pulmonary clinic/  [facility]/ [facility]  - 01/09/2018  After extensive coaching inhaler device  effectiveness =   75% > continue symb 160 2bid and return for pfts in 3 months ? RA bronchiolitis?  - PFT's  04/23/2018  FEV1 2.84 (91 % ) ratio 73  p no % improvement from saba p symb 160 prior to study with DLCO  76 % corrects to 79  % for alv volume   - 04/23/2018  After extensive coaching  . Bilateral inguinal hernia without obstruction or gangrene 06/20/2016  . CAD (coronary artery disease), native coronary artery 06/20/2016  . Chest pain, unspecified   . COPD (chronic obstructive pulmonary disease) ([place])   . Cough 03/25/2012   Followed in Pulmonary clinic/ [facility]/ [facility]    - Trial off acei again  03/25/2012    - Sinus CT 01/24/11 There is mucosal thickening within the paranasal sinuses without  air-fluid levels. Neither ostiomeatal unit is patent.   . Cough variant asthma 10/18/2010   Followed in Pulmonary clinic/ [facility]/ [facility]  -HFA 75% p coaching 01/22/2011  -PFT's  02/04/2011 minimal airflow obst, nl dlco   . Disorder of bone and cartilage 01/22/2007   Qualifier: Diagnosis of  By: Larose Kells MD, [place]   Overview:  Overview:  Qualifier: Diagnosis of  By: Larose Kells MD, [place] and other specified forms of tremor   . Essential hypertension 11/06/2017  . GERD 01/22/2007   Annotation: history of esophageal stricture  Qualifier: Diagnosis of  By: Larose Kells MD, [place] GERD (gastroesophageal reflux disease)   . Hip fracture ([place])   . Hyperlipidemia 06/20/2016  . Hypertension   . HYPOGONADISM 02/18/2007   Qualifier: Diagnosis of  By: Larose Kells MD, [place] Hypogonadism male   . Low back pain 09/09/2013  . Multiple lung nodules on CT 11/21/2015   CT [place] 10/17/15 mpns > 15 y since quit smoking > rec 12 m f/u as this is low risk > done 06/04/16 no change rec recheck in 66m  - CT [place] 12/08/17 no change nodules > meets benign criteria > no directed f/u - Quantiferon GOLD TB 01/09/18 neg   . Osteopenia   .  Personal history of malignant neoplasm of prostate   . Postoperative visit 08/14/2016  . Tremor, essential 02/18/2007   Overview:  Overview:  Qualifier: Diagnosis of  By: Larose Kells MD, [place]  Last Assessment & Plan:  Reviewed tremor may increase on dulera so will need to be balanced against benefits to cough    Past Surgical History:  Procedure Laterality Date  . CORONARY ANGIOPLASTY WITH STENT PLACEMENT  2008  . INSERTION PROSTATE RADIATION SEED      Current Medications: Current Meds  Medication Sig  . albuterol (PROAIR HFA) 108 (90 BASE) MCG/ACT inhaler Inhale 2 puffs into the lungs every 6 (six) hours as needed for wheezing.  Marland Kitchen allopurinol (ZYLOPRIM) 100 MG tablet Take 1 tablet by mouth daily.   . Ascorbic Acid (VITAMIN C) 1000 MG tablet Take 500 mg by mouth.  Marland Kitchen aspirin EC 81 MG tablet Take 1 tablet (81 mg total) by mouth daily.  Marland Kitchen augmented betamethasone dipropionate (DIPROLENE-AF) 0.05 % ointment   . budesonide-formoterol (SYMBICORT) 160-4.5 MCG/ACT inhaler Inhale 2 puffs into the lungs 2 (two) times daily.  . Calcium Carbonate-Vitamin D (CALCIUM 600 + D PO) Take by mouth daily.  . celecoxib (CELEBREX) 200 MG capsule Take 200 mg by mouth daily.   . Coenzyme Q10 (CO Q-10) 75 MG CAPS Take 120 mg by mouth daily.   . cyanocobalamin 50 MCG tablet Take 500 mcg by mouth daily.   . famotidine (PEPCID) 20 MG tablet Take 20 mg by mouth daily.   Marland Kitchen FERROUS SULFATE DRIED PO Take by mouth.  . fluticasone (FLONASE) 50 MCG/ACT nasal spray 2 sprays by Nasal route daily.  . hydrochlorothiazide (HYDRODIURIL) 25 MG tablet Take 1 tablet (25 mg total) by mouth daily.  . irbesartan (AVAPRO) 300 MG tablet Take 300 mg by mouth daily.   Marland Kitchen loratadine (CLARITIN) 10 MG tablet Take 10 mg by mouth daily.   . Magnesium 400 MG CAPS Take by mouth. 3 tabs daily  . Multiple Vitamin (MULTIVITAMIN) capsule Take 1 capsule by mouth daily.    . nitroGLYCERIN (NITROSTAT) 0.4 MG SL tablet Place 1 tablet (0.4 mg total) under  the tongue every 5 (five) minutes as needed for chest pain.  . pravastatin (PRAVACHOL) 10 MG tablet TAKE 1 TABLET (10 MG TOTAL) BY MOUTH DAILY. PLEASE CALL TO SCHEDULE APPOINTMENT FOR FURTHER REFILLS.  Marland Kitchen primidone (MYSOLINE) 50 MG tablet Take 50 mg by mouth at bedtime.   . sodium polystyrene (KAYEXALATE) powder   . Tamsulosin HCl (FLOMAX) 0.4 MG CAPS Take 0.4 mg by mouth as needed.      Allergies:   Metoprolol, Morphine, Penicillin g, Penicillins, and Propranolol   Social History   Socioeconomic History  . Marital status: Married    Spouse name: Not  on file  . Number of children: Not on file  . Years of education: Not on file  . Highest education level: Not on file  Occupational History  . Not on file  Tobacco Use  . Smoking status: Former Smoker    Packs/day: 1.00    Years: 35.00    Pack years: 35.00    Types: Cigarettes    Quit date: 11/04/1996    Years since quitting: 23.2  . Smokeless tobacco: Never Used  Substance and Sexual Activity  . Alcohol use: Not on file    Comment: ETOH daily  . Drug use: Yes  . Sexual activity: Not on file  Other Topics Concern  . Not on file  Social History Narrative   Married and has children.  Works in Press photographer.   Social Determinants of Health   Financial Resource Strain:   . Difficulty of Paying Living Expenses:   Food Insecurity:   . Worried About Charity fundraiser in the Last Year:   . Arboriculturist in the Last Year:   Transportation Needs:   . Film/video editor (Medical):   Marland Kitchen Lack of Transportation (Non-Medical):   Physical Activity:   . Days of Exercise per Week:   . Minutes of Exercise per Session:   Stress:   . Feeling of Stress :   Social Connections:   . Frequency of Communication with Friends and Family:   . Frequency of Social Gatherings with Friends and Family:   . Attends Religious Services:   . Active Member of Clubs or Organizations:   . Attends Archivist Meetings:   Marland Kitchen Marital Status:       Family History: The patient's family history includes Asthma in his father and sister; Heart disease in his mother and sister; Parkinsonism in his father; Throat cancer in his sister.  ROS:   Please see the history of present illness.    All other systems reviewed and are negative.  EKGs/Labs/Other Studies Reviewed:    The following studies were reviewed today: I discussed my findings with the patient at length.   Recent Labs: 01/12/2020: ALT 18; Hemoglobin 11.5; Platelets 258; TSH 1.860 02/07/2020: BUN 19; Creatinine, Ser 1.16; Potassium 5.0; Sodium 135  Recent Lipid Panel    Component Value Date/Time   CHOL 167 01/12/2020 1005   TRIG 68 01/12/2020 1005   HDL 92 01/12/2020 1005   CHOLHDL 1.8 01/12/2020 1005   CHOLHDL 2.5 CALC 01/22/2007 0923   VLDL 19 01/22/2007 0923   LDLCALC 62 01/12/2020 1005   LDLDIRECT 117.9 01/22/2007 0923    Physical Exam:    VS:  BP (!) 156/70   Pulse 66   Temp (!) 97.2 F (36.2 C)   Ht 5' 10.5" (1.791 m)   Wt 167 lb 3.2 oz (75.8 kg)   SpO2 99%   BMI 23.65 kg/m     Wt Readings from Last 3 Encounters:  02/16/20 167 lb 3.2 oz (75.8 kg)  01/12/20 169 lb (76.7 kg)  01/19/19 180 lb (81.6 kg)     GEN: Patient is in no acute distress HEENT: Normal NECK: No JVD; No carotid bruits LYMPHATICS: No lymphadenopathy CARDIAC: Hear sounds regular, 2/6 systolic murmur at the apex. RESPIRATORY:  Clear to auscultation without rales, wheezing or rhonchi  ABDOMEN: Soft, non-tender, non-distended MUSCULOSKELETAL:  No edema; No deformity  SKIN: Warm and dry NEUROLOGIC:  Alert and oriented x 3 PSYCHIATRIC:  Normal affect   Signed, Jenean Lindau, MD  02/16/2020 10:30 AM    [facility]

## 2020-06-16 ENCOUNTER — Ambulatory Visit (INDEPENDENT_AMBULATORY_CARE_PROVIDER_SITE_OTHER): Payer: Medicare HMO | Admitting: Cardiology

## 2020-06-16 ENCOUNTER — Ambulatory Visit: Payer: Medicare HMO | Admitting: Cardiology

## 2020-06-16 ENCOUNTER — Encounter: Payer: Self-pay | Admitting: Cardiology

## 2020-06-16 ENCOUNTER — Other Ambulatory Visit: Payer: Self-pay

## 2020-06-16 VITALS — BP 152/74 | HR 68 | Ht 70.0 in | Wt 157.8 lb

## 2020-06-16 DIAGNOSIS — E782 Mixed hyperlipidemia: Secondary | ICD-10-CM | POA: Diagnosis not present

## 2020-06-16 DIAGNOSIS — I2510 Atherosclerotic heart disease of native coronary artery without angina pectoris: Secondary | ICD-10-CM | POA: Diagnosis not present

## 2020-06-16 DIAGNOSIS — I10 Essential (primary) hypertension: Secondary | ICD-10-CM

## 2020-06-16 NOTE — Progress Notes (Signed)
Cardiology Office Note:    Date:  06/16/2020   ID:  Brian Dunn [place], DOB 29-Aug-1941, MRN [id number]  PCP:  Angelina Sheriff, MD  Cardiologist:  Jenean Lindau, MD   Referring MD: Angelina Sheriff, MD    ASSESSMENT:    1. Coronary artery disease involving native coronary artery of native heart without angina pectoris   2. Essential hypertension   3. Mixed hyperlipidemia    PLAN:    In order of problems listed above:  1. Coronary artery disease: Secondary prevention stressed with the patient.  Importance of compliance with diet medication stressed and he vocalized understanding.  Importance of regular exercise stressed walking half an hour a day at least 5 days a week and he promises to do so. 2. Essential hypertension: Blood pressure stable and diet was emphasized 3. Mixed dyslipidemia: Labs are fine from the month of March this year.  I reviewed them with the patient.  He says that he is going to see his primary care doctor soon for blood work and will get a copy of those records and review them. 4. Patient will be seen in follow-up appointment in 6 months or earlier if the patient has any concerns    Medication Adjustments/Labs and Tests Ordered: Current medicines are reviewed at length with the patient today.  Concerns regarding medicines are outlined above.  No orders of the defined types were placed in this encounter.  No orders of the defined types were placed in this encounter.    No chief complaint on file.    History of Present Illness:    Brian Dunn is a 79 y.o. male.  Patient has past medical history of coronary artery disease essential hypertension and dyslipidemia.  He denies any problems at this time and takes care of activities of daily living.  No chest pain orthopnea or PND.  At the time of my evaluation, the patient is alert awake oriented and in no distress.  Past Medical History:  Diagnosis Date  . Acute bronchitis 05/29/2012  .  Allergic rhinitis   . ALLERGIC RHINITIS 01/22/2007   Qualifier: Diagnosis of  By: Larose Kells MD, [place] 06/18/2012   Followed in Pulmonary clinic/ [facility]/ [facility]  - 01/09/2018  After extensive coaching inhaler device  effectiveness =   75% > continue symb 160 2bid and return for pfts in 3 months ? RA bronchiolitis?  - PFT's  04/23/2018  FEV1 2.84 (91 % ) ratio 73  p no % improvement from saba p symb 160 prior to study with DLCO  76 % corrects to 79  % for alv volume   - 04/23/2018  After extensive coaching  . Bilateral inguinal hernia without obstruction or gangrene 06/20/2016  . CAD (coronary artery disease), native coronary artery 06/20/2016  . Chest pain, unspecified   . COPD (chronic obstructive pulmonary disease) ([place])   . Cough 03/25/2012   Followed in Pulmonary clinic/ [facility]/ [facility]    - Trial off acei again  03/25/2012    - Sinus CT 01/24/11 There is mucosal thickening within the paranasal sinuses without  air-fluid levels. Neither ostiomeatal unit is patent.   . Cough variant asthma 10/18/2010   Followed in Pulmonary clinic/ [facility]/ [facility]  -HFA 75% p coaching 01/22/2011  -PFT's  02/04/2011 minimal airflow obst, nl dlco   . Disorder of bone and cartilage 01/22/2007   Qualifier: Diagnosis of  By: Larose Kells MD, [place]  Overview:  Overview:  Qualifier: Diagnosis of  By: Larose Kells MD, [place] and other specified forms of tremor   . Essential hypertension 11/06/2017  . GERD 01/22/2007   Annotation: history of esophageal stricture Qualifier: Diagnosis of  By: Larose Kells MD, [place] GERD (gastroesophageal reflux disease)   . Hip fracture ([place])   . Hyperlipidemia 06/20/2016  . Hypertension   . HYPOGONADISM 02/18/2007   Qualifier: Diagnosis of  By: Larose Kells MD, [place] Hypogonadism male   . Low back pain 09/09/2013  . Multiple lung nodules on CT 11/21/2015   CT [place] 10/17/15 mpns > 15 y since quit smoking > rec 12 m f/u as this is low risk > done 06/04/16 no change rec  recheck in 49m  - CT [place] 12/08/17 no change nodules > meets benign criteria > no directed f/u - Quantiferon GOLD TB 01/09/18 neg   . Osteopenia   . Personal history of malignant neoplasm of prostate   . Postoperative visit 08/14/2016  . Tremor, essential 02/18/2007   Overview:  Overview:  Qualifier: Diagnosis of  By: Larose Kells MD, [place]  Last Assessment & Plan:  Reviewed tremor may increase on dulera so will need to be balanced against benefits to cough    Past Surgical History:  Procedure Laterality Date  . CORONARY ANGIOPLASTY WITH STENT PLACEMENT  2008  . INSERTION PROSTATE RADIATION SEED      Current Medications: Current Meds  Medication Sig  . albuterol (PROAIR HFA) 108 (90 BASE) MCG/ACT inhaler Inhale 2 puffs into the lungs every 6 (six) hours as needed for wheezing.  Marland Kitchen allopurinol (ZYLOPRIM) 100 MG tablet Take 1 tablet by mouth daily.   . Ascorbic Acid (VITAMIN C) 1000 MG tablet Take 500 mg by mouth.  Marland Kitchen aspirin EC 81 MG tablet Take 1 tablet (81 mg total) by mouth daily.  Marland Kitchen augmented betamethasone dipropionate (DIPROLENE-AF) 0.05 % ointment Apply 1 application topically as needed.   . budesonide-formoterol (SYMBICORT) 160-4.5 MCG/ACT inhaler Inhale 2 puffs into the lungs 2 (two) times daily.  . Calcium Carbonate-Vitamin D (CALCIUM 600 + D PO) Take by mouth daily.  . celecoxib (CELEBREX) 200 MG capsule Take 200 mg by mouth daily.   . Coenzyme Q10 (CO Q-10) 75 MG CAPS Take 120 mg by mouth daily.   . cyanocobalamin 50 MCG tablet Take 500 mcg by mouth daily.   . famotidine (PEPCID) 20 MG tablet Take 20 mg by mouth daily.   . fluticasone (FLONASE) 50 MCG/ACT nasal spray 2 sprays by Nasal route daily.  . irbesartan (AVAPRO) 300 MG tablet Take 300 mg by mouth daily.   Marland Kitchen loratadine (CLARITIN) 10 MG tablet Take 10 mg by mouth daily.   . Magnesium 400 MG CAPS Take 400 mg by mouth in the morning, at noon, and at bedtime.   . methylPREDNISolone (MEDROL DOSEPAK) 4 MG TBPK tablet Take by mouth as  directed.  . Multiple Vitamin (MULTIVITAMIN) capsule Take 1 capsule by mouth daily.    . nitroGLYCERIN (NITROSTAT) 0.4 MG SL tablet Place 1 tablet (0.4 mg total) under the tongue every 5 (five) minutes as needed for chest pain.  . pravastatin (PRAVACHOL) 10 MG tablet TAKE 1 TABLET (10 MG TOTAL) BY MOUTH DAILY. PLEASE CALL TO SCHEDULE APPOINTMENT FOR FURTHER REFILLS.  Marland Kitchen primidone (MYSOLINE) 50 MG tablet Take 50 mg by mouth at bedtime.   . Tamsulosin HCl (FLOMAX) 0.4 MG CAPS Take 0.4 mg by mouth as  needed.   . traMADol (ULTRAM) 50 MG tablet Take 50 mg by mouth 2 (two) times daily as needed.     Allergies:   Metoprolol, Morphine, Penicillin g, Penicillins, and Propranolol   Social History   Socioeconomic History  . Marital status: Married    Spouse name: Not on file  . Number of children: Not on file  . Years of education: Not on file  . Highest education level: Not on file  Occupational History  . Not on file  Tobacco Use  . Smoking status: Former Smoker    Packs/day: 1.00    Years: 35.00    Pack years: 35.00    Types: Cigarettes    Quit date: 11/04/1996    Years since quitting: 23.6  . Smokeless tobacco: Never Used  Substance and Sexual Activity  . Alcohol use: Not on file    Comment: ETOH daily  . Drug use: Yes  . Sexual activity: Not on file  Other Topics Concern  . Not on file  Social History Narrative   Married and has children.  Works in Press photographer.   Social Determinants of Health   Financial Resource Strain:   . Difficulty of Paying Living Expenses:   Food Insecurity:   . Worried About Charity fundraiser in the Last Year:   . Arboriculturist in the Last Year:   Transportation Needs:   . Film/video editor (Medical):   Marland Kitchen Lack of Transportation (Non-Medical):   Physical Activity:   . Days of Exercise per Week:   . Minutes of Exercise per Session:   Stress:   . Feeling of Stress :   Social Connections:   . Frequency of Communication with Friends and Family:    . Frequency of Social Gatherings with Friends and Family:   . Attends Religious Services:   . Active Member of Clubs or Organizations:   . Attends Archivist Meetings:   Marland Kitchen Marital Status:      Family History: The patient's family history includes Asthma in his father and sister; Heart disease in his mother and sister; Parkinsonism in his father; Throat cancer in his sister.  ROS:   Please see the history of present illness.    All other systems reviewed and are negative.  EKGs/Labs/Other Studies Reviewed:    The following studies were reviewed today: I discussed my findings with the patient at length.  Labs from March April were reviewed.   Recent Labs: 01/12/2020: ALT 18; Hemoglobin 11.5; Platelets 258; TSH 1.860 02/16/2020: BUN 23; Creatinine, Dunn 1.43; Potassium 4.8; Sodium 131  Recent Lipid Panel    Component Value Date/Time   CHOL 167 01/12/2020 1005   TRIG 68 01/12/2020 1005   HDL 92 01/12/2020 1005   CHOLHDL 1.8 01/12/2020 1005   CHOLHDL 2.5 CALC 01/22/2007 0923   VLDL 19 01/22/2007 0923   LDLCALC 62 01/12/2020 1005   LDLDIRECT 117.9 01/22/2007 0923    Physical Exam:    VS:  BP (!) 152/74   Pulse 68   Ht 5\' 10"  (1.778 m)   Wt 157 lb 12.8 oz (71.6 kg)   SpO2 99%   BMI 22.64 kg/m     Wt Readings from Last 3 Encounters:  06/16/20 157 lb 12.8 oz (71.6 kg)  02/16/20 167 lb 3.2 oz (75.8 kg)  01/12/20 169 lb (76.7 kg)     GEN: Patient is in no acute distress HEENT: Normal NECK: No JVD; No carotid bruits LYMPHATICS: No lymphadenopathy  CARDIAC: Hear sounds regular, 2/6 systolic murmur at the apex. RESPIRATORY:  Clear to auscultation without rales, wheezing or rhonchi  ABDOMEN: Soft, non-tender, non-distended MUSCULOSKELETAL:  No edema; No deformity  SKIN: Warm and dry NEUROLOGIC:  Alert and oriented x 3 PSYCHIATRIC:  Normal affect   Signed, Jenean Lindau, MD  06/16/2020 9:48 AM    [facility]

## 2020-06-16 NOTE — Patient Instructions (Signed)

## 2020-07-14 ENCOUNTER — Ambulatory Visit: Payer: Medicare HMO | Admitting: Cardiology

## 2020-08-29 DIAGNOSIS — D649 Anemia, unspecified: Secondary | ICD-10-CM | POA: Diagnosis not present

## 2020-11-06 ENCOUNTER — Other Ambulatory Visit: Payer: Self-pay | Admitting: Cardiology

## 2020-11-06 DIAGNOSIS — K219 Gastro-esophageal reflux disease without esophagitis: Secondary | ICD-10-CM | POA: Insufficient documentation

## 2020-11-06 DIAGNOSIS — I10 Essential (primary) hypertension: Secondary | ICD-10-CM | POA: Insufficient documentation

## 2020-11-06 DIAGNOSIS — E291 Testicular hypofunction: Secondary | ICD-10-CM | POA: Insufficient documentation

## 2020-11-06 DIAGNOSIS — J449 Chronic obstructive pulmonary disease, unspecified: Secondary | ICD-10-CM | POA: Insufficient documentation

## 2020-11-06 DIAGNOSIS — M8580 Other specified disorders of bone density and structure, unspecified site: Secondary | ICD-10-CM | POA: Insufficient documentation

## 2020-11-07 ENCOUNTER — Ambulatory Visit: Payer: Medicare HMO | Admitting: Cardiology

## 2020-11-07 ENCOUNTER — Encounter: Payer: Self-pay | Admitting: Cardiology

## 2020-11-07 ENCOUNTER — Other Ambulatory Visit: Payer: Self-pay

## 2020-11-07 VITALS — BP 156/74 | HR 62 | Ht 70.5 in | Wt 159.4 lb

## 2020-11-07 DIAGNOSIS — I209 Angina pectoris, unspecified: Secondary | ICD-10-CM | POA: Diagnosis not present

## 2020-11-07 DIAGNOSIS — I10 Essential (primary) hypertension: Secondary | ICD-10-CM | POA: Diagnosis not present

## 2020-11-07 DIAGNOSIS — E782 Mixed hyperlipidemia: Secondary | ICD-10-CM | POA: Diagnosis not present

## 2020-11-07 DIAGNOSIS — I2510 Atherosclerotic heart disease of native coronary artery without angina pectoris: Secondary | ICD-10-CM

## 2020-11-07 HISTORY — DX: Angina pectoris, unspecified: I20.9

## 2020-11-07 NOTE — Progress Notes (Signed)
Cardiology Office Note:    Date:  11/07/2020   ID:  Brian Dunn, DOB 1941/02/04, MRN [id number]  PCP:  Noni Saupe, MD  Cardiologist:  Garwin Brothers, MD   Referring MD: Noni Saupe, MD    ASSESSMENT:    1. Coronary artery disease involving native coronary artery of native heart without angina pectoris   2. Essential hypertension   3. Mixed hyperlipidemia   4. Angina pectoris (HCC)    PLAN:    In order of problems listed above:  1. Angina pectoris: I discussed my findings with the patient extensively. His symptoms are concerning. I made the following recommendations for him.In view of the patient's symptoms, I discussed with the patient options for evaluation. Invasive and noninvasive options were given to the patient. I discussed stress testing and coronary angiography and left heart catheterization at length. Benefits, pros and cons of each approach were discussed at length. Patient had multiple questions which were answered to the patient's satisfaction. Patient opted for invasive evaluation and we will set up for coronary angiography and left heart catheterization. Further recommendations will be made based on the findings with coronary angiography. In the interim if the patient has any significant symptoms in hospital to the nearest emergency room. 2. Sublingual nitroglycerin prescription was sent, its protocol and 911 protocol explained and the patient vocalized understanding questions were answered to the patient's satisfaction 3. Essential hypertension: Blood pressure is elevated but I think there is an element of anxiety in view of what is going on in his life 4. Mixed dyslipidemia: Diet was emphasized lipids were reviewed and they are fine. I reviewed the KPN sheet with him. 5. He will be seen follow-up appointment after coronary angiography.   Medication Adjustments/Labs and Tests Ordered: Current medicines are reviewed at length with the patient today.   Concerns regarding medicines are outlined above.  No orders of the defined types were placed in this encounter.  No orders of the defined types were placed in this encounter.    No chief complaint on file.    History of Present Illness:    Brian Dunn is a 80 y.o. male. Patient has past medical history of coronary artery disease post coronary stenting in the remote past, essential hypertension dyslipidemia. Patient mentions to me that when he exerts himself he has chest tightness going to the neck. That is the reason why he has not done much. No chest pain orthopnea or PND. At the time of my evaluation, the patient is alert awake oriented and in no distress. Patient has used nitroglycerin with not much help. He tells me that it is more nitroglycerin. We will send him a prescription for him.  Past Medical History:  Diagnosis Date  . Acute bronchitis 05/29/2012  . Allergic rhinitis   . ALLERGIC RHINITIS 01/22/2007   Qualifier: Diagnosis of  By: Drue Novel MD, Jose E.   . Asthma 06/18/2012   Followed in Pulmonary clinic/ [facility]/ [facility]  - 01/09/2018  After extensive coaching inhaler device  effectiveness =   75% > continue symb 160 2bid and return for pfts in 3 months ? RA bronchiolitis?  - PFT's  04/23/2018  FEV1 2.84 (91 % ) ratio 73  p no % improvement from saba p symb 160 prior to study with DLCO  76 % corrects to 79  % for alv volume   - 04/23/2018  After extensive coaching  . Bilateral inguinal hernia without obstruction or gangrene  06/20/2016  . CAD (coronary artery disease), native coronary artery 06/20/2016  . Chest pain, unspecified   . COPD (chronic obstructive pulmonary disease) ([place])   . Cough 03/25/2012   Followed in Pulmonary clinic/ [facility]/ [facility]    - Trial off acei again  03/25/2012    - Sinus CT 01/24/11 There is mucosal thickening within the paranasal sinuses without  air-fluid levels. Neither ostiomeatal unit is patent.   . Cough variant asthma 10/18/2010    Followed in Pulmonary clinic/ [facility]/ [facility]  -HFA 75% p coaching 01/22/2011  -PFT's  02/04/2011 minimal airflow obst, nl dlco   . Disorder of bone and cartilage 01/22/2007   Qualifier: Diagnosis of  By: Larose Kells MD, [place]   Overview:  Overview:  Qualifier: Diagnosis of  By: Larose Kells MD, [place] and other specified forms of tremor   . Essential hypertension 11/06/2017  . GERD 01/22/2007   Annotation: history of esophageal stricture Qualifier: Diagnosis of  By: Larose Kells MD, [place] GERD (gastroesophageal reflux disease)   . HIP FRACTURE 01/22/2007   Annotation: right-sided status post surgery Qualifier: Diagnosis of  By: Larose Kells MD, [place] Hip fracture ([place])   . Hyperlipidemia 06/20/2016  . Hypertension   . HYPOGONADISM 02/18/2007   Qualifier: Diagnosis of  By: Larose Kells MD, [place] Hypogonadism male   . Low back pain 09/09/2013  . Multiple lung nodules on CT 11/21/2015   CT [place] 10/17/15 mpns > 15 y since quit smoking > rec 12 m f/u as this is low risk > done 06/04/16 no change rec recheck in 34m  - CT [place] 12/08/17 no change nodules > meets benign criteria > no directed f/u - Quantiferon GOLD TB 01/09/18 neg   . Osteopenia   . Personal history of malignant neoplasm of prostate   . Postoperative visit 08/14/2016  . Tremor, essential 02/18/2007   Overview:  Overview:  Qualifier: Diagnosis of  By: Larose Kells MD, [place]  Last Assessment & Plan:  Reviewed tremor may increase on dulera so will need to be balanced against benefits to cough    Past Surgical History:  Procedure Laterality Date  . CORONARY ANGIOPLASTY WITH STENT PLACEMENT  2008  . INSERTION PROSTATE RADIATION SEED      Current Medications: Current Meds  Medication Sig  . albuterol (PROAIR HFA) 108 (90 BASE) MCG/ACT inhaler Inhale 2 puffs into the lungs every 6 (six) hours as needed for wheezing.  Marland Kitchen allopurinol (ZYLOPRIM) 100 MG tablet Take 1 tablet by mouth daily.   . Ascorbic Acid (VITAMIN C) 1000 MG tablet Take 500 mg by  mouth daily.  Marland Kitchen aspirin EC 81 MG tablet Take 1 tablet (81 mg total) by mouth daily.  Marland Kitchen augmented betamethasone dipropionate (DIPROLENE-AF) 0.05 % ointment Apply 1 application topically as needed.   . budesonide-formoterol (SYMBICORT) 160-4.5 MCG/ACT inhaler Inhale 2 puffs into the lungs daily.  . celecoxib (CELEBREX) 200 MG capsule Take 200 mg by mouth daily.   . Coenzyme Q10 (CO Q-10) 75 MG CAPS Take 120 mg by mouth daily.  . cyanocobalamin 50 MCG tablet Take 500 mcg by mouth daily.   . famotidine (PEPCID) 40 MG tablet Take 40 mg by mouth daily.  . fluticasone (FLONASE) 50 MCG/ACT nasal spray 2 sprays by Nasal route daily.  . irbesartan (AVAPRO) 300 MG tablet Take 300 mg by mouth daily.   Marland Kitchen loratadine (CLARITIN) 10 MG tablet Take 10 mg by  mouth daily.   . Magnesium 400 MG CAPS Take 400 mg by mouth in the morning, at noon, and at bedtime.   . Multiple Vitamin (MULTIVITAMIN) capsule Take 1 capsule by mouth daily.  . nitroGLYCERIN (NITROSTAT) 0.4 MG SL tablet Place 1 tablet (0.4 mg total) under the tongue every 5 (five) minutes as needed for chest pain.  . pravastatin (PRAVACHOL) 10 MG tablet TAKE 1 TABLET (10 MG TOTAL) BY MOUTH DAILY. PLEASE CALL TO SCHEDULE APPOINTMENT FOR FURTHER REFILLS.  Marland Kitchen primidone (MYSOLINE) 50 MG tablet Take 50 mg by mouth at bedtime.   . traMADol (ULTRAM) 50 MG tablet Take 50 mg by mouth 2 (two) times daily as needed.     Allergies:   Metoprolol, Morphine, Penicillin g, Penicillins, and Propranolol   Social History   Socioeconomic History  . Marital status: Married    Spouse name: Not on file  . Number of children: Not on file  . Years of education: Not on file  . Highest education level: Not on file  Occupational History  . Not on file  Tobacco Use  . Smoking status: Former Smoker    Packs/day: 1.00    Years: 35.00    Pack years: 35.00    Types: Cigarettes    Quit date: 11/04/1996    Years since quitting: 24.0  . Smokeless tobacco: Never Used  Substance  and Sexual Activity  . Alcohol use: Not on file    Comment: ETOH daily  . Drug use: Yes  . Sexual activity: Not on file  Other Topics Concern  . Not on file  Social History Narrative   Married and has children.  Works in [employer].   Social Determinants of Health   Financial Resource Strain: Not on file  Food Insecurity: Not on file  Transportation Needs: Not on file  Physical Activity: Not on file  Stress: Not on file  Social Connections: Not on file     Family History: The patient's family history includes Asthma in his father and sister; Heart disease in his mother and sister; Parkinsonism in his father; Throat cancer in his sister.  ROS:   Please see the history of present illness.    All other systems reviewed and are negative.  EKGs/Labs/Other Studies Reviewed:    The following studies were reviewed today: EKG was sinus rhythm and nonspecific ST-T changes   Recent Labs: 01/12/2020: ALT 18; Hemoglobin 11.5; Platelets 258; TSH 1.860 02/16/2020: BUN 23; Creatinine, Ser 1.43; Potassium 4.8; Sodium 131  Recent Lipid Panel    Component Value Date/Time   CHOL 167 01/12/2020 1005   TRIG 68 01/12/2020 1005   HDL 92 01/12/2020 1005   CHOLHDL 1.8 01/12/2020 1005   CHOLHDL 2.5 CALC 01/22/2007 0923   VLDL 19 01/22/2007 0923   LDLCALC 62 01/12/2020 1005   LDLDIRECT 117.9 01/22/2007 0923    Physical Exam:    VS:  BP (!) 156/74   Pulse 62   Ht 5' 10.5" (1.791 m)   Wt 159 lb 6.4 oz (72.3 kg)   SpO2 97%   BMI 22.55 kg/m     Wt Readings from Last 3 Encounters:  11/07/20 159 lb 6.4 oz (72.3 kg)  06/16/20 157 lb 12.8 oz (71.6 kg)  02/16/20 167 lb 3.2 oz (75.8 kg)     GEN: Patient is in no acute distress HEENT: Normal NECK: No JVD; No carotid bruits LYMPHATICS: No lymphadenopathy CARDIAC: Hear sounds regular, 2/6 systolic murmur at the apex. RESPIRATORY:  Clear to  auscultation without rales, wheezing or rhonchi  ABDOMEN: Soft, non-tender,  non-distended MUSCULOSKELETAL:  No edema; No deformity  SKIN: Warm and dry NEUROLOGIC:  Alert and oriented x 3 PSYCHIATRIC:  Normal affect   Signed, Jenean Lindau, MD  11/07/2020 2:22 PM    [facility]

## 2020-11-07 NOTE — Patient Instructions (Signed)
Medication Instructions:  Your physician has recommended you make the following change in your medication:   1.  START Nitroglycerin 0.4 s/l tablets.  Use only as directed for chest pain  *If you need a refill on your cardiac medications before your next appointment, please call your pharmacy*   Lab Work: TODAY:  BMET & CBC  If you have labs (blood work) drawn today and your tests are completely normal, you will receive your results only by: Marland Kitchen MyChart Message (if you have MyChart) OR . A paper copy in the mail If you have any lab test that is abnormal or we need to change your treatment, we will call you to review the results.   Testing/Procedures: Your physician has requested that you have a cardiac catheterization. Cardiac catheterization is used to diagnose and/or treat various heart conditions. Doctors may recommend this procedure for a number of different reasons. The most common reason is to evaluate chest pain. Chest pain can be a symptom of coronary artery disease (CAD), and cardiac catheterization can show whether plaque is narrowing or blocking your heart's arteries. This procedure is also used to evaluate the valves, as well as measure the blood flow and oxygen levels in different parts of your heart. For further information please visit [email or web address]. Please follow instruction BELOW:     [place] [address] Dept: [phone] Loc: [place]  11/07/2020  You are scheduled for a Cardiac Catheterization on Friday, January 7 with Dr. Glenetta Hew.  1. Please arrive at the [facility] (Main Entrance A) at [facility]: [address] [place], [place] [address] at 8:30 AM (This time is two hours before your procedure to ensure your preparation). Free valet parking service is available.   Special note: Every effort is made to have your  procedure done on time. Please understand that emergencies sometimes delay scheduled procedures.  2. Diet: Do not eat solid foods after midnight.  The patient may have clear liquids until 5am upon the day of the procedure.  3. Labs: You will need to have blood drawn on TODAY  4. Medication instructions in preparation for your procedure:   Contrast Allergy: No  COVID TESTING:    Due to recent COVID-19 restrictions implemented by our local and state authorities and in an effort to keep both patients and staff as safe as possible, our hospital system requires COVID-19 testing prior to certain scheduled hospital procedures.  Please go to [facility] [place]. [place], [place] [address] on 11/08/20 at 9:00 am  .  This is a drive up testing site.  You will not need to exit your vehicle.  You will not be billed at the time of testing but may receive a bill later depending on your insurance. You must agree to self-quarantine from the time of your testing until the procedure date on 11/10/2020.  This should included staying home with ONLY the people you live with.  Avoid take-out, grocery store shopping or leaving the house for any non-emergent reason.  Failure to have your COVID-19 test done on the date and time you have been scheduled will result in cancellation of your procedure.  Please call our office at [phone] if you have any questions.    On the morning of your procedure, take your Aspirin and any morning medicines NOT listed above.  You may use sips of water.  5. Plan for  one night stay--bring personal belongings. 6. Bring a current list of your medications and current insurance cards. 7. You MUST have a responsible person to drive you home. 8. Someone MUST be with you the first 24 hours after you arrive home or your discharge will be delayed. 9. Please wear clothes that are easy to get on and off and wear slip-on shoes.  Thank you for allowing [place] to care for you!   -- [place] [facility]   Follow-Up: At [facility], you and your health needs are our priority.  As part of our continuing mission to provide you with exceptional heart care, we have created designated Provider Care Teams.  These Care Teams include your primary Cardiologist (physician) and Advanced Practice Providers (APPs -  Physician Assistants and Nurse Practitioners) who all work together to provide you with the care you need, when you need it.  We recommend signing up for the patient portal called "MyChart".  Sign up information is provided on this After Visit Summary.  MyChart is used to connect with patients for Virtual Visits (Telemedicine).  Patients are able to view lab/test results, encounter notes, upcoming appointments, etc.  Non-urgent messages can be sent to your provider as well.   To learn more about what you can do with MyChart, go to [email or web address].    Your next appointment:   2 week(s)  The format for your next appointment:   In Person  Provider:   Belva Crome, MD   Other Instructions

## 2020-11-07 NOTE — H&P (View-Only) (Signed)
Cardiology Office Note:    Date:  11/07/2020   ID:  Brian [place], DOB 1941/02/04, MRN [id number]  PCP:  Noni Saupe, MD  Cardiologist:  Garwin Brothers, MD   Referring MD: Noni Saupe, MD    ASSESSMENT:    1. Coronary artery disease involving native coronary artery of native heart without angina pectoris   2. Essential hypertension   3. Mixed hyperlipidemia   4. Angina pectoris (HCC)    PLAN:    In order of problems listed above:  1. Angina pectoris: I discussed my findings with the patient extensively. His symptoms are concerning. I made the following recommendations for him.In view of the patient's symptoms, I discussed with the patient options for evaluation. Invasive and noninvasive options were given to the patient. I discussed stress testing and coronary angiography and left heart catheterization at length. Benefits, pros and cons of each approach were discussed at length. Patient had multiple questions which were answered to the patient's satisfaction. Patient opted for invasive evaluation and we will set up for coronary angiography and left heart catheterization. Further recommendations will be made based on the findings with coronary angiography. In the interim if the patient has any significant symptoms in hospital to the nearest emergency room. 2. Sublingual nitroglycerin prescription was sent, its protocol and 911 protocol explained and the patient vocalized understanding questions were answered to the patient's satisfaction 3. Essential hypertension: Blood pressure is elevated but I think there is an element of anxiety in view of what is going on in his life 4. Mixed dyslipidemia: Diet was emphasized lipids were reviewed and they are fine. I reviewed the KPN sheet with him. 5. He will be seen follow-up appointment after coronary angiography.   Medication Adjustments/Labs and Tests Ordered: Current medicines are reviewed at length with the patient today.   Concerns regarding medicines are outlined above.  No orders of the defined types were placed in this encounter.  No orders of the defined types were placed in this encounter.    No chief complaint on file.    History of Present Illness:    Brian Dunn is a 80 y.o. male. Patient has past medical history of coronary artery disease post coronary stenting in the remote past, essential hypertension dyslipidemia. Patient mentions to me that when he exerts himself he has chest tightness going to the neck. That is the reason why he has not done much. No chest pain orthopnea or PND. At the time of my evaluation, the patient is alert awake oriented and in no distress. Patient has used nitroglycerin with not much help. He tells me that it is more nitroglycerin. We will send him a prescription for him.  Past Medical History:  Diagnosis Date  . Acute bronchitis 05/29/2012  . Allergic rhinitis   . ALLERGIC RHINITIS 01/22/2007   Qualifier: Diagnosis of  By: Drue Novel MD, Jose E.   . Asthma 06/18/2012   Followed in Pulmonary clinic/ [facility]/ [facility]  - 01/09/2018  After extensive coaching inhaler device  effectiveness =   75% > continue symb 160 2bid and return for pfts in 3 months ? RA bronchiolitis?  - PFT's  04/23/2018  FEV1 2.84 (91 % ) ratio 73  p no % improvement from saba p symb 160 prior to study with DLCO  76 % corrects to 79  % for alv volume   - 04/23/2018  After extensive coaching  . Bilateral inguinal hernia without obstruction or gangrene  06/20/2016  . CAD (coronary artery disease), native coronary artery 06/20/2016  . Chest pain, unspecified   . COPD (chronic obstructive pulmonary disease) (HCC)   . Cough 03/25/2012   Followed in Pulmonary clinic/ [facility]/ [facility]    - Trial off acei again  03/25/2012    - Sinus CT 01/24/11 There is mucosal thickening within the paranasal sinuses without  air-fluid levels. Neither ostiomeatal unit is patent.   . Cough variant asthma 10/18/2010    Followed in Pulmonary clinic/ [facility]/ [facility]  -HFA 75% p coaching 01/22/2011  -PFT's  02/04/2011 minimal airflow obst, nl dlco   . Disorder of bone and cartilage 01/22/2007   Qualifier: Diagnosis of  By: Paz MD, Jose E.   Overview:  Overview:  Qualifier: Diagnosis of  By: Paz MD, Jose E.  . Essential and other specified forms of tremor   . Essential hypertension 11/06/2017  . GERD 01/22/2007   Annotation: history of esophageal stricture Qualifier: Diagnosis of  By: Paz MD, Jose E.   . GERD (gastroesophageal reflux disease)   . HIP FRACTURE 01/22/2007   Annotation: right-sided status post surgery Qualifier: Diagnosis of  By: Paz MD, Jose E.   . Hip fracture (HCC)   . Hyperlipidemia 06/20/2016  . Hypertension   . HYPOGONADISM 02/18/2007   Qualifier: Diagnosis of  By: Paz MD, Jose E.   . Hypogonadism male   . Low back pain 09/09/2013  . Multiple lung nodules on CT 11/21/2015   CT [place] 10/17/15 mpns > 15 y since quit smoking > rec 12 m f/u as this is low risk > done 06/04/16 no change rec recheck in 18m  - CT [place] 12/08/17 no change nodules > meets benign criteria > no directed f/u - Quantiferon GOLD TB 01/09/18 neg   . Osteopenia   . Personal history of malignant neoplasm of prostate   . Postoperative visit 08/14/2016  . Tremor, essential 02/18/2007   Overview:  Overview:  Qualifier: Diagnosis of  By: Paz MD, Jose E.  Last Assessment & Plan:  Reviewed tremor may increase on dulera so will need to be balanced against benefits to cough    Past Surgical History:  Procedure Laterality Date  . CORONARY ANGIOPLASTY WITH STENT PLACEMENT  2008  . INSERTION PROSTATE RADIATION SEED      Current Medications: Current Meds  Medication Sig  . albuterol (PROAIR HFA) 108 (90 BASE) MCG/ACT inhaler Inhale 2 puffs into the lungs every 6 (six) hours as needed for wheezing.  . allopurinol (ZYLOPRIM) 100 MG tablet Take 1 tablet by mouth daily.   . Ascorbic Acid (VITAMIN C) 1000 MG tablet Take 500 mg by  mouth daily.  . aspirin EC 81 MG tablet Take 1 tablet (81 mg total) by mouth daily.  . augmented betamethasone dipropionate (DIPROLENE-AF) 0.05 % ointment Apply 1 application topically as needed.   . budesonide-formoterol (SYMBICORT) 160-4.5 MCG/ACT inhaler Inhale 2 puffs into the lungs daily.  . celecoxib (CELEBREX) 200 MG capsule Take 200 mg by mouth daily.   . Coenzyme Q10 (CO Q-10) 75 MG CAPS Take 120 mg by mouth daily.  . cyanocobalamin 50 MCG tablet Take 500 mcg by mouth daily.   . famotidine (PEPCID) 40 MG tablet Take 40 mg by mouth daily.  . fluticasone (FLONASE) 50 MCG/ACT nasal spray 2 sprays by Nasal route daily.  . irbesartan (AVAPRO) 300 MG tablet Take 300 mg by mouth daily.   . loratadine (CLARITIN) 10 MG tablet Take 10 mg by   mouth daily.   . Magnesium 400 MG CAPS Take 400 mg by mouth in the morning, at noon, and at bedtime.   . Multiple Vitamin (MULTIVITAMIN) capsule Take 1 capsule by mouth daily.  . nitroGLYCERIN (NITROSTAT) 0.4 MG SL tablet Place 1 tablet (0.4 mg total) under the tongue every 5 (five) minutes as needed for chest pain.  . pravastatin (PRAVACHOL) 10 MG tablet TAKE 1 TABLET (10 MG TOTAL) BY MOUTH DAILY. PLEASE CALL TO SCHEDULE APPOINTMENT FOR FURTHER REFILLS.  . primidone (MYSOLINE) 50 MG tablet Take 50 mg by mouth at bedtime.   . traMADol (ULTRAM) 50 MG tablet Take 50 mg by mouth 2 (two) times daily as needed.     Allergies:   Metoprolol, Morphine, Penicillin g, Penicillins, and Propranolol   Social History   Socioeconomic History  . Marital status: Married    Spouse name: Not on file  . Number of children: Not on file  . Years of education: Not on file  . Highest education level: Not on file  Occupational History  . Not on file  Tobacco Use  . Smoking status: Former Smoker    Packs/day: 1.00    Years: 35.00    Pack years: 35.00    Types: Cigarettes    Quit date: 11/04/1996    Years since quitting: 24.0  . Smokeless tobacco: Never Used  Substance  and Sexual Activity  . Alcohol use: Not on file    Comment: ETOH daily  . Drug use: Yes  . Sexual activity: Not on file  Other Topics Concern  . Not on file  Social History Narrative   Married and has children.  Works in sales.   Social Determinants of Health   Financial Resource Strain: Not on file  Food Insecurity: Not on file  Transportation Needs: Not on file  Physical Activity: Not on file  Stress: Not on file  Social Connections: Not on file     Family History: The patient's family history includes Asthma in his father and sister; Heart disease in his mother and sister; Parkinsonism in his father; Throat cancer in his sister.  ROS:   Please see the history of present illness.    All other systems reviewed and are negative.  EKGs/Labs/Other Studies Reviewed:    The following studies were reviewed today: EKG was sinus rhythm and nonspecific ST-T changes   Recent Labs: 01/12/2020: ALT 18; Hemoglobin 11.5; Platelets 258; TSH 1.860 02/16/2020: BUN 23; Creatinine, Ser 1.43; Potassium 4.8; Sodium 131  Recent Lipid Panel    Component Value Date/Time   CHOL 167 01/12/2020 1005   TRIG 68 01/12/2020 1005   HDL 92 01/12/2020 1005   CHOLHDL 1.8 01/12/2020 1005   CHOLHDL 2.5 CALC 01/22/2007 0923   VLDL 19 01/22/2007 0923   LDLCALC 62 01/12/2020 1005   LDLDIRECT 117.9 01/22/2007 0923    Physical Exam:    VS:  BP (!) 156/74   Pulse 62   Ht 5' 10.5" (1.791 m)   Wt 159 lb 6.4 oz (72.3 kg)   SpO2 97%   BMI 22.55 kg/m     Wt Readings from Last 3 Encounters:  11/07/20 159 lb 6.4 oz (72.3 kg)  06/16/20 157 lb 12.8 oz (71.6 kg)  02/16/20 167 lb 3.2 oz (75.8 kg)     GEN: Patient is in no acute distress HEENT: Normal NECK: No JVD; No carotid bruits LYMPHATICS: No lymphadenopathy CARDIAC: Hear sounds regular, 2/6 systolic murmur at the apex. RESPIRATORY:  Clear to   auscultation without rales, wheezing or rhonchi  ABDOMEN: Soft, non-tender,  non-distended MUSCULOSKELETAL:  No edema; No deformity  SKIN: Warm and dry NEUROLOGIC:  Alert and oriented x 3 PSYCHIATRIC:  Normal affect   Signed, Renn Stille R Hildagarde Holleran, MD  11/07/2020 2:22 PM    [facility]  

## 2020-11-08 ENCOUNTER — Other Ambulatory Visit (HOSPITAL_COMMUNITY)
Admission: RE | Admit: 2020-11-08 | Discharge: 2020-11-08 | Disposition: A | Discharge status: Home / Self Care | Payer: Medicare HMO | Source: Ambulatory Visit | Attending: Cardiology | Admitting: Cardiology

## 2020-11-08 DIAGNOSIS — Z01812 Encounter for preprocedural laboratory examination: Secondary | ICD-10-CM | POA: Diagnosis present

## 2020-11-08 DIAGNOSIS — Z20822 Contact with and (suspected) exposure to covid-19: Secondary | ICD-10-CM | POA: Diagnosis not present

## 2020-11-08 LAB — BASIC METABOLIC PANEL
BUN/Creatinine Ratio: 19 (ref 10–24)
BUN: 20 mg/dL (ref 8–27)
CO2: 25 mmol/L (ref 20–29)
Calcium: 9.7 mg/dL (ref 8.6–10.2)
Chloride: 95 mmol/L — ABNORMAL LOW (ref 96–106)
Creatinine, Ser: 1.06 mg/dL (ref 0.76–1.27)
GFR calc Af Amer: 76 mL/min/{1.73_m2} (ref >59)
GFR calc non Af Amer: 66 mL/min/{1.73_m2} (ref >59)
Glucose: 87 mg/dL (ref 65–99)
Potassium: 4.9 mmol/L (ref 3.5–5.2)
Sodium: 131 mmol/L — ABNORMAL LOW (ref 134–144)

## 2020-11-08 LAB — CBC
Hematocrit: 33.5 % — ABNORMAL LOW (ref 37.5–51.0)
Hemoglobin: 11.7 g/dL — ABNORMAL LOW (ref 13.0–17.7)
MCH: 33.1 pg — ABNORMAL HIGH (ref 26.6–33.0)
MCHC: 34.9 g/dL (ref 31.5–35.7)
MCV: 95 fL (ref 79–97)
Platelets: 280 10*3/uL (ref 150–450)
RBC: 3.53 x10E6/uL — ABNORMAL LOW (ref 4.14–5.80)
RDW: 12.1 % (ref 11.6–15.4)
WBC: 8.5 10*3/uL (ref 3.4–10.8)

## 2020-11-09 ENCOUNTER — Telehealth: Payer: Self-pay | Admitting: *Deleted

## 2020-11-09 LAB — SARS CORONAVIRUS 2 (TAT 6-24 HRS): SARS Coronavirus 2: NEGATIVE

## 2020-11-09 NOTE — Telephone Encounter (Signed)
Pt contacted pre-catheterization scheduled at [facility] for: Friday November 10, 2020 10:30 AM Verified arrival time and place: [facility] Main Entrance [facility]) at: 8:30 AM   No solid food after midnight prior to cath, clear liquids until 5 AM day of procedure.   AM meds can be  taken pre-cath with sips of water including: ASA 81 mg   Confirmed patient has responsible adult to drive home post procedure and be with patient first 24 hours after arriving home:  You are allowed ONE visitor in the waiting room during the time you are at the hospital for your procedure. Both you and your visitor must wear a mask once you enter the hospital.    Left detailed message at mobile number listed (DPR) with procedure instructions, call if any questions.

## 2020-11-10 ENCOUNTER — Ambulatory Visit (HOSPITAL_COMMUNITY): Admission: RE | Admit: 2020-11-10 | Payer: Medicare HMO | Source: Home / Self Care | Admitting: Cardiology

## 2020-11-10 ENCOUNTER — Telehealth: Payer: Self-pay | Admitting: Cardiology

## 2020-11-10 ENCOUNTER — Encounter (HOSPITAL_COMMUNITY): Admission: RE | Payer: Medicare HMO | Source: Home / Self Care

## 2020-11-10 SURGERY — LEFT HEART CATH AND CORONARY ANGIOGRAPHY
Anesthesia: LOCAL

## 2020-11-10 NOTE — Telephone Encounter (Signed)
Patient states he had to cancel his catheterization, because it was denied by his insurance. He states they denied it, because they did not have the stress test attached. He states they require a stress test first. He would also like to make sure the catheterization was cancelled.

## 2020-11-10 NOTE — Telephone Encounter (Signed)
I would like to do peer-to-peer for this authorization if it was denied.  Patient need heart catheterization.

## 2020-11-13 ENCOUNTER — Telehealth: Payer: Self-pay | Admitting: Cardiology

## 2020-11-13 NOTE — Telephone Encounter (Signed)
Patient states that [employer], company that handles his insurance's authorizations, left him a message stating that his catheterization has been approved. He is ready to go ahead and get this scheduled.

## 2020-11-13 NOTE — Telephone Encounter (Signed)
New cath appt is 11/16/20 arrive at 11:00 for a 1:00 cath. Pt will go have his COVID test on 11/15/19. Pt verbalized understanding and had no additional questions.

## 2020-11-14 ENCOUNTER — Other Ambulatory Visit (HOSPITAL_COMMUNITY)
Admission: RE | Admit: 2020-11-14 | Discharge: 2020-11-14 | Disposition: A | Discharge status: Home / Self Care | Payer: Medicare HMO | Source: Ambulatory Visit | Attending: Cardiology | Admitting: Cardiology

## 2020-11-14 DIAGNOSIS — Z20822 Contact with and (suspected) exposure to covid-19: Secondary | ICD-10-CM | POA: Diagnosis not present

## 2020-11-14 DIAGNOSIS — Z01812 Encounter for preprocedural laboratory examination: Secondary | ICD-10-CM | POA: Diagnosis not present

## 2020-11-14 LAB — SARS CORONAVIRUS 2 (TAT 6-24 HRS): SARS Coronavirus 2: NEGATIVE

## 2020-11-15 ENCOUNTER — Telehealth: Payer: Self-pay | Admitting: *Deleted

## 2020-11-15 NOTE — Telephone Encounter (Signed)
Pt contacted pre-catheterization scheduled at [facility] for: Thursday November 16, 2020 1:30 PM Verified arrival time and place: [facility]) at: 11:30 AM   No solid food after midnight prior to cath, clear liquids until 5 AM day of procedure.   AM meds can be  taken pre-cath with sips of water including: ASA 81 mg   Confirmed patient has responsible adult to drive home post procedure and be with patient first 24 hours after arriving home: yes  You are allowed ONE visitor in the waiting room during the time you are at the hospital for your procedure. Both you and your visitor must wear a mask once you enter the hospital.  Reviewed procedure/mask/visitor instructions with patient.

## 2020-11-16 ENCOUNTER — Other Ambulatory Visit: Payer: Self-pay

## 2020-11-16 ENCOUNTER — Encounter (HOSPITAL_COMMUNITY)
Admission: RE | Disposition: A | Discharge status: Home / Self Care | Payer: Self-pay | Source: Home / Self Care | Attending: Cardiology

## 2020-11-16 ENCOUNTER — Ambulatory Visit (HOSPITAL_COMMUNITY)
Admission: RE | Admit: 2020-11-16 | Discharge: 2020-11-17 | Disposition: A | Discharge status: Home / Self Care | Payer: Medicare HMO | Attending: Cardiology | Admitting: Cardiology

## 2020-11-16 DIAGNOSIS — E785 Hyperlipidemia, unspecified: Secondary | ICD-10-CM | POA: Diagnosis present

## 2020-11-16 DIAGNOSIS — Z87891 Personal history of nicotine dependence: Secondary | ICD-10-CM | POA: Diagnosis not present

## 2020-11-16 DIAGNOSIS — Z955 Presence of coronary angioplasty implant and graft: Secondary | ICD-10-CM | POA: Insufficient documentation

## 2020-11-16 DIAGNOSIS — Z79899 Other long term (current) drug therapy: Secondary | ICD-10-CM | POA: Diagnosis not present

## 2020-11-16 DIAGNOSIS — E782 Mixed hyperlipidemia: Secondary | ICD-10-CM | POA: Insufficient documentation

## 2020-11-16 DIAGNOSIS — I10 Essential (primary) hypertension: Secondary | ICD-10-CM | POA: Diagnosis present

## 2020-11-16 DIAGNOSIS — Z885 Allergy status to narcotic agent status: Secondary | ICD-10-CM | POA: Insufficient documentation

## 2020-11-16 DIAGNOSIS — Z880 Allergy status to penicillin: Secondary | ICD-10-CM | POA: Diagnosis not present

## 2020-11-16 DIAGNOSIS — J449 Chronic obstructive pulmonary disease, unspecified: Secondary | ICD-10-CM | POA: Diagnosis not present

## 2020-11-16 DIAGNOSIS — Z7982 Long term (current) use of aspirin: Secondary | ICD-10-CM | POA: Diagnosis not present

## 2020-11-16 DIAGNOSIS — I25119 Atherosclerotic heart disease of native coronary artery with unspecified angina pectoris: Secondary | ICD-10-CM | POA: Diagnosis present

## 2020-11-16 DIAGNOSIS — I209 Angina pectoris, unspecified: Secondary | ICD-10-CM | POA: Diagnosis present

## 2020-11-16 DIAGNOSIS — I2510 Atherosclerotic heart disease of native coronary artery without angina pectoris: Secondary | ICD-10-CM

## 2020-11-16 HISTORY — PX: LEFT HEART CATH AND CORONARY ANGIOGRAPHY: CATH118249

## 2020-11-16 HISTORY — PX: CORONARY STENT INTERVENTION: CATH118234

## 2020-11-16 LAB — POCT ACTIVATED CLOTTING TIME
Activated Clotting Time: 249 seconds
Activated Clotting Time: 237 seconds
Activated Clotting Time: 225 seconds
Activated Clotting Time: 196 seconds
Activated Clotting Time: 172 seconds

## 2020-11-16 SURGERY — LEFT HEART CATH AND CORONARY ANGIOGRAPHY
Anesthesia: LOCAL

## 2020-11-16 MED ORDER — SODIUM CHLORIDE 0.9% FLUSH: 3.0000 mL | Freq: Two times a day (BID) | INTRAVENOUS | Status: DC

## 2020-11-16 MED ORDER — ASPIRIN 81 MG PO CHEW: 81.0000 mg | CHEWABLE_TABLET | ORAL | Status: DC

## 2020-11-16 MED ORDER — SODIUM CHLORIDE 0.9% FLUSH: 3.0000 mL | INTRAVENOUS | Status: DC | PRN

## 2020-11-16 MED ORDER — PRAVASTATIN SODIUM 10 MG PO TABS
10.0000 mg | ORAL_TABLET | Freq: Every day | ORAL | Status: DC
Start: 2020-11-17 — End: 2020-11-17
  Administered 2020-11-17: 10 mg via ORAL
  Filled 2020-11-16: qty 1

## 2020-11-16 MED ORDER — HEPARIN SODIUM (PORCINE) 1000 UNIT/ML IJ SOLN
INTRAMUSCULAR | Status: AC
  Filled 2020-11-16: qty 1

## 2020-11-16 MED ORDER — CLOPIDOGREL BISULFATE 75 MG PO TABS
75.0000 mg | ORAL_TABLET | Freq: Every day | ORAL | Status: DC
  Administered 2020-11-17: 75 mg via ORAL
  Filled 2020-11-16: qty 1

## 2020-11-16 MED ORDER — VERAPAMIL HCL 2.5 MG/ML IV SOLN
INTRAVENOUS | Status: AC
  Filled 2020-11-16: qty 2

## 2020-11-16 MED ORDER — SODIUM CHLORIDE 0.9% FLUSH
3.0000 mL | Freq: Two times a day (BID) | INTRAVENOUS | Status: DC
  Administered 2020-11-16 – 2020-11-17 (×2): 3 mL via INTRAVENOUS

## 2020-11-16 MED ORDER — CLOPIDOGREL BISULFATE 300 MG PO TABS
ORAL_TABLET | ORAL | Status: DC | PRN
  Administered 2020-11-16 (×2): 300 mg via ORAL

## 2020-11-16 MED ORDER — MOMETASONE FURO-FORMOTEROL FUM 200-5 MCG/ACT IN AERO
2.0000 | INHALATION_SPRAY | Freq: Two times a day (BID) | RESPIRATORY_TRACT | Status: DC
  Administered 2020-11-17: 2 via RESPIRATORY_TRACT
  Filled 2020-11-16: qty 8.8

## 2020-11-16 MED ORDER — ALUM & MAG HYDROXIDE-SIMETH 200-200-20 MG/5ML PO SUSP
ORAL | Status: AC
  Filled 2020-11-16: qty 30

## 2020-11-16 MED ORDER — HYDRALAZINE HCL 20 MG/ML IJ SOLN: 10.0000 mg | INTRAMUSCULAR | Status: AC | PRN

## 2020-11-16 MED ORDER — FENTANYL CITRATE (PF) 100 MCG/2ML IJ SOLN
25.0000 ug | INTRAMUSCULAR | Status: AC | PRN
  Administered 2020-11-16: 25 ug via INTRAVENOUS

## 2020-11-16 MED ORDER — NITROGLYCERIN 0.4 MG SL SUBL: 0.4000 mg | SUBLINGUAL_TABLET | SUBLINGUAL | Status: DC | PRN

## 2020-11-16 MED ORDER — VERAPAMIL HCL 2.5 MG/ML IV SOLN
INTRAVENOUS | Status: DC | PRN
  Administered 2020-11-16 (×2): 10 mL via INTRA_ARTERIAL

## 2020-11-16 MED ORDER — FENTANYL CITRATE (PF) 100 MCG/2ML IJ SOLN
INTRAMUSCULAR | Status: AC
  Filled 2020-11-16: qty 2

## 2020-11-16 MED ORDER — FENTANYL CITRATE (PF) 100 MCG/2ML IJ SOLN
25.0000 ug | Freq: Once | INTRAMUSCULAR | Status: AC
  Administered 2020-11-16 (×2): 25 ug via INTRAVENOUS

## 2020-11-16 MED ORDER — ASCORBIC ACID 500 MG PO TABS
500.0000 mg | ORAL_TABLET | Freq: Every day | ORAL | Status: DC
  Administered 2020-11-17: 500 mg via ORAL
  Filled 2020-11-16: qty 1

## 2020-11-16 MED ORDER — MIDAZOLAM HCL 2 MG/2ML IJ SOLN
INTRAMUSCULAR | Status: DC | PRN
  Administered 2020-11-16: 2 mg via INTRAVENOUS

## 2020-11-16 MED ORDER — MIDAZOLAM HCL 2 MG/2ML IJ SOLN
INTRAMUSCULAR | Status: AC
  Filled 2020-11-16: qty 2

## 2020-11-16 MED ORDER — VITAMIN B-12 1000 MCG PO TABS
500.0000 ug | ORAL_TABLET | Freq: Every day | ORAL | Status: DC
  Administered 2020-11-17: 500 ug via ORAL
  Filled 2020-11-16: qty 1

## 2020-11-16 MED ORDER — HEPARIN (PORCINE) IN NACL 1000-0.9 UT/500ML-% IV SOLN
INTRAVENOUS | Status: DC | PRN
  Administered 2020-11-16 (×2): 500 mL

## 2020-11-16 MED ORDER — CLOPIDOGREL BISULFATE 300 MG PO TABS
ORAL_TABLET | ORAL | Status: AC
  Filled 2020-11-16: qty 1

## 2020-11-16 MED ORDER — ALLOPURINOL 100 MG PO TABS
100.0000 mg | ORAL_TABLET | Freq: Every day | ORAL | Status: DC
  Administered 2020-11-17: 100 mg via ORAL
  Filled 2020-11-16: qty 1

## 2020-11-16 MED ORDER — SODIUM CHLORIDE 0.9 % WEIGHT BASED INFUSION: 1.0000 mL/kg/h | INTRAVENOUS | Status: DC

## 2020-11-16 MED ORDER — LIDOCAINE HCL (PF) 1 % IJ SOLN
INTRAMUSCULAR | Status: DC | PRN
  Administered 2020-11-16: 2 mL
  Administered 2020-11-16: 15 mL

## 2020-11-16 MED ORDER — SODIUM CHLORIDE 0.9 % WEIGHT BASED INFUSION
3.0000 mL/kg/h | INTRAVENOUS | Status: DC
  Administered 2020-11-16: 3 mL/kg/h via INTRAVENOUS

## 2020-11-16 MED ORDER — IOHEXOL 350 MG/ML SOLN
INTRAVENOUS | Status: DC | PRN
  Administered 2020-11-16: 155 mL

## 2020-11-16 MED ORDER — CO Q-10 75 MG PO CAPS: 120.0000 mg | ORAL_CAPSULE | Freq: Every day | ORAL | Status: DC

## 2020-11-16 MED ORDER — HEPARIN SODIUM (PORCINE) 1000 UNIT/ML IJ SOLN
INTRAMUSCULAR | Status: DC | PRN
  Administered 2020-11-16: 3000 [IU] via INTRAVENOUS
  Administered 2020-11-16: 2000 [IU] via INTRAVENOUS
  Administered 2020-11-16: 4000 [IU] via INTRAVENOUS
  Administered 2020-11-16: 3500 [IU] via INTRAVENOUS

## 2020-11-16 MED ORDER — SODIUM CHLORIDE 0.9 % IV SOLN: INTRAVENOUS | Status: AC

## 2020-11-16 MED ORDER — HEPARIN (PORCINE) IN NACL 1000-0.9 UT/500ML-% IV SOLN
INTRAVENOUS | Status: AC
  Filled 2020-11-16: qty 1000

## 2020-11-16 MED ORDER — LABETALOL HCL 5 MG/ML IV SOLN: 10.0000 mg | INTRAVENOUS | Status: AC | PRN

## 2020-11-16 MED ORDER — SODIUM CHLORIDE 0.9 % IV SOLN: 250.0000 mL | INTRAVENOUS | Status: DC | PRN

## 2020-11-16 MED ORDER — FAMOTIDINE 20 MG PO TABS
40.0000 mg | ORAL_TABLET | Freq: Every day | ORAL | Status: DC
  Administered 2020-11-17: 40 mg via ORAL
  Filled 2020-11-16: qty 2

## 2020-11-16 MED ORDER — NITROGLYCERIN 0.4 MG SL SUBL
0.4000 mg | SUBLINGUAL_TABLET | SUBLINGUAL | Status: DC | PRN
  Administered 2020-11-16: 0.4 mg via SUBLINGUAL

## 2020-11-16 MED ORDER — NITROGLYCERIN 0.4 MG/SPRAY TL SOLN
Status: AC
  Filled 2020-11-16: qty 4.9

## 2020-11-16 MED ORDER — IRBESARTAN 150 MG PO TABS
300.0000 mg | ORAL_TABLET | Freq: Every day | ORAL | Status: DC
  Administered 2020-11-17: 300 mg via ORAL
  Filled 2020-11-16: qty 2

## 2020-11-16 MED ORDER — LORATADINE 10 MG PO TABS
10.0000 mg | ORAL_TABLET | Freq: Every day | ORAL | Status: DC
  Administered 2020-11-17: 10 mg via ORAL
  Filled 2020-11-16: qty 1

## 2020-11-16 MED ORDER — ONDANSETRON HCL 4 MG/2ML IJ SOLN: 4.0000 mg | Freq: Four times a day (QID) | INTRAMUSCULAR | Status: DC | PRN

## 2020-11-16 MED ORDER — PRIMIDONE 50 MG PO TABS
50.0000 mg | ORAL_TABLET | Freq: Every day | ORAL | Status: DC
  Administered 2020-11-16: 50 mg via ORAL
  Filled 2020-11-16 (×2): qty 1

## 2020-11-16 MED ORDER — ASPIRIN EC 81 MG PO TBEC
81.0000 mg | DELAYED_RELEASE_TABLET | Freq: Every day | ORAL | Status: DC
  Administered 2020-11-17: 81 mg via ORAL
  Filled 2020-11-16: qty 1

## 2020-11-16 MED ORDER — FENTANYL CITRATE (PF) 100 MCG/2ML IJ SOLN
INTRAMUSCULAR | Status: DC | PRN
  Administered 2020-11-16: 25 ug via INTRAVENOUS
  Administered 2020-11-16: 50 ug via INTRAVENOUS
  Administered 2020-11-16: 25 ug via INTRAVENOUS

## 2020-11-16 MED ORDER — LIDOCAINE HCL (PF) 1 % IJ SOLN
INTRAMUSCULAR | Status: AC
  Filled 2020-11-16: qty 30

## 2020-11-16 MED ORDER — ACETAMINOPHEN 325 MG PO TABS: 650.0000 mg | ORAL_TABLET | ORAL | Status: DC | PRN

## 2020-11-16 MED ORDER — ALUM & MAG HYDROXIDE-SIMETH 200-200-20 MG/5ML PO SUSP
ORAL | Status: DC | PRN
  Administered 2020-11-16: 30 mL via ORAL

## 2020-11-16 MED ORDER — ALBUTEROL SULFATE HFA 108 (90 BASE) MCG/ACT IN AERS
2.0000 | INHALATION_SPRAY | Freq: Four times a day (QID) | RESPIRATORY_TRACT | Status: DC | PRN
  Filled 2020-11-16: qty 6.7

## 2020-11-16 SURGICAL SUPPLY — 30 items
BALLN SAPPHIRE 2.5X15 (BALLOONS) ×2
BALLN SAPPHIRE ~~LOC~~ 3.5X15 (BALLOONS) ×1 IMPLANT
BALLOON SAPPHIRE 2.5X15 (BALLOONS) IMPLANT
CATH INFINITI 5 FR JL3.5 (CATHETERS) ×1 IMPLANT
CATH INFINITI 5FR ANG PIGTAIL (CATHETERS) ×1 IMPLANT
CATH OPTITORQUE TIG 4.0 5F (CATHETERS) ×1 IMPLANT
CATH VISTA GUIDE 6FR XB3.5 (CATHETERS) ×1 IMPLANT
DEVICE RAD COMP TR BAND LRG (VASCULAR PRODUCTS) ×1 IMPLANT
ELECT DEFIB PAD ADLT CADENCE (PAD) ×1 IMPLANT
GLIDESHEATH SLEND SS 6F .021 (SHEATH) ×1 IMPLANT
GUIDEWIRE INQWIRE 1.5J.035X260 (WIRE) IMPLANT
INQWIRE 1.5J .035X260CM (WIRE) ×2
KIT ENCORE 26 ADVANTAGE (KITS) ×1 IMPLANT
KIT ESSENTIALS PG (KITS) ×1 IMPLANT
KIT HEART LEFT (KITS) ×2 IMPLANT
PACK CARDIAC CATHETERIZATION (CUSTOM PROCEDURE TRAY) ×2 IMPLANT
PINNACLE LONG 6F 25CM (SHEATH) ×2
PINNACLE LONG 7F 25CM (SHEATH) ×2
SHEATH INTRO PINNACLE 6F 25CM (SHEATH) IMPLANT
SHEATH INTRO PINNACLE 7F 25CM (SHEATH) IMPLANT
SHEATH PINNACLE 6F 10CM (SHEATH) ×1 IMPLANT
SHEATH PROBE COVER 6X72 (BAG) ×1 IMPLANT
STENT RESOLUTE ONYX 3.0X34 (Permanent Stent) ×1 IMPLANT
SYR MEDRAD MARK 7 150ML (SYRINGE) ×2 IMPLANT
TRANSDUCER W/STOPCOCK (MISCELLANEOUS) ×2 IMPLANT
TUBING CIL FLEX 10 FLL-RA (TUBING) ×2 IMPLANT
WIRE ASAHI PROWATER 180CM (WIRE) ×1 IMPLANT
WIRE EMERALD 3MM-J .035X150CM (WIRE) ×1 IMPLANT
WIRE HI TORQ VERSACORE-J 145CM (WIRE) ×1 IMPLANT
WIRE MICROINTRODUCER 60CM (WIRE) ×1 IMPLANT

## 2020-11-16 NOTE — Progress Notes (Signed)
Site area: rt groin fa sheath pulled by Wynonia Sours Site Prior to Removal:  Level 1 small hematoma above insertion site Pressure Applied For: 25 minutes Manual:   yes Patient Status During Pull:  stable Post Pull Site:  Level 0; hematoma resolved. Dime sized bruise at puncture site Post Pull Instructions Given:  yes Post Pull Pulses Present: rt dp palpable Dressing Applied:  Gauze and tegaderm Bedrest begins @ 1935 Comments:

## 2020-11-16 NOTE — Progress Notes (Addendum)
Dr. Ellyn Hack talking w/patient. NTG spray x1 464mcg under tongue

## 2020-11-16 NOTE — Interval H&P Note (Signed)
History and Physical Interval Note:  11/16/2020 5:05 PM  [facility]  has presented today for surgery, with the diagnosis of angina=- class III.  The various methods of treatment have been discussed with the patient and family. After consideration of risks, benefits and other options for treatment, the patient has consented to  Procedure(s): LEFT HEART CATH AND CORONARY ANGIOGRAPHY (N/A) CORONARY STENT INTERVENTION (N/A) as a surgical intervention.  The patient's history has been reviewed, patient examined, no change in status, stable for surgery.  I have reviewed the patient's chart and labs.  Questions were answered to the patient's satisfaction.     Cath Lab Visit (complete for each Cath Lab visit)  Clinical Evaluation Leading to the Procedure:   ACS: No.  Non-ACS:    Anginal Classification: CCS III  Anti-ischemic medical therapy: Minimal Therapy (1 class of medications)  Non-Invasive Test Results: No non-invasive testing performed  Prior CABG: No previous CABG    Glenetta Hew

## 2020-11-17 ENCOUNTER — Encounter (HOSPITAL_COMMUNITY): Payer: Self-pay | Admitting: Cardiology

## 2020-11-17 DIAGNOSIS — I10 Essential (primary) hypertension: Secondary | ICD-10-CM

## 2020-11-17 DIAGNOSIS — J449 Chronic obstructive pulmonary disease, unspecified: Secondary | ICD-10-CM | POA: Diagnosis not present

## 2020-11-17 DIAGNOSIS — E785 Hyperlipidemia, unspecified: Secondary | ICD-10-CM | POA: Diagnosis not present

## 2020-11-17 DIAGNOSIS — Z87891 Personal history of nicotine dependence: Secondary | ICD-10-CM | POA: Diagnosis not present

## 2020-11-17 DIAGNOSIS — Z955 Presence of coronary angioplasty implant and graft: Secondary | ICD-10-CM | POA: Diagnosis not present

## 2020-11-17 DIAGNOSIS — Z880 Allergy status to penicillin: Secondary | ICD-10-CM | POA: Diagnosis not present

## 2020-11-17 DIAGNOSIS — Z7982 Long term (current) use of aspirin: Secondary | ICD-10-CM | POA: Diagnosis not present

## 2020-11-17 DIAGNOSIS — I25119 Atherosclerotic heart disease of native coronary artery with unspecified angina pectoris: Secondary | ICD-10-CM | POA: Diagnosis not present

## 2020-11-17 DIAGNOSIS — Z79899 Other long term (current) drug therapy: Secondary | ICD-10-CM | POA: Diagnosis not present

## 2020-11-17 DIAGNOSIS — Z885 Allergy status to narcotic agent status: Secondary | ICD-10-CM | POA: Diagnosis not present

## 2020-11-17 DIAGNOSIS — E782 Mixed hyperlipidemia: Secondary | ICD-10-CM | POA: Diagnosis not present

## 2020-11-17 LAB — CBC
HCT: 27.5 % — ABNORMAL LOW (ref 39.0–52.0)
Hemoglobin: 9.6 g/dL — ABNORMAL LOW (ref 13.0–17.0)
MCH: 33.6 pg (ref 26.0–34.0)
MCHC: 34.9 g/dL (ref 30.0–36.0)
MCV: 96.2 fL (ref 80.0–100.0)
Platelets: 221 10*3/uL (ref 150–400)
RBC: 2.86 MIL/uL — ABNORMAL LOW (ref 4.22–5.81)
RDW: 13.1 % (ref 11.5–15.5)
WBC: 5.5 10*3/uL (ref 4.0–10.5)
nRBC: 0 % (ref 0.0–0.2)

## 2020-11-17 LAB — BASIC METABOLIC PANEL
Anion gap: 10 (ref 5–15)
BUN: 13 mg/dL (ref 8–23)
CO2: 21 mmol/L — ABNORMAL LOW (ref 22–32)
Calcium: 8.7 mg/dL — ABNORMAL LOW (ref 8.9–10.3)
Chloride: 102 mmol/L (ref 98–111)
Creatinine, Ser: 0.84 mg/dL (ref 0.61–1.24)
GFR, Estimated: >60 mL/min (ref >60)
Glucose, Bld: 82 mg/dL (ref 70–99)
Potassium: 3.7 mmol/L (ref 3.5–5.1)
Sodium: 133 mmol/L — ABNORMAL LOW (ref 135–145)

## 2020-11-17 LAB — GLUCOSE, CAPILLARY: Glucose-Capillary: 89 mg/dL (ref 70–99)

## 2020-11-17 LAB — MRSA PCR SCREENING: MRSA by PCR: NEGATIVE

## 2020-11-17 MED ORDER — PRAVASTATIN SODIUM 40 MG PO TABS
40.0000 mg | ORAL_TABLET | Freq: Every day | ORAL | Status: DC
Start: 2020-11-18 — End: 2020-11-17

## 2020-11-17 MED ORDER — PRAVASTATIN SODIUM 40 MG PO TABS: 40.0000 mg | ORAL_TABLET | Freq: Every day | ORAL | 6 refills | Status: DC

## 2020-11-17 MED ORDER — ISOSORBIDE MONONITRATE ER 30 MG PO TB24
15.0000 mg | ORAL_TABLET | Freq: Every day | ORAL | Status: DC
  Administered 2020-11-17: 15 mg via ORAL
  Filled 2020-11-17: qty 1

## 2020-11-17 MED ORDER — ISOSORBIDE MONONITRATE ER 30 MG PO TB24: 15.0000 mg | ORAL_TABLET | Freq: Every day | ORAL | 6 refills | Status: DC

## 2020-11-17 MED ORDER — CLOPIDOGREL BISULFATE 75 MG PO TABS: 75.0000 mg | ORAL_TABLET | Freq: Every day | ORAL | 6 refills | Status: DC

## 2020-11-17 NOTE — Progress Notes (Signed)
All set for discharge home awaiting ride home.

## 2020-11-17 NOTE — Plan of Care (Signed)

## 2020-11-17 NOTE — Discharge Summary (Addendum)
Discharge Summary    Patient ID: [facility] MRN: [id number]; DOB: May 23, 1941  Admit date: 11/16/2020 Discharge date: 11/17/2020  Primary Care Provider: Angelina Sheriff, MD  Primary Cardiologist: Brian Lindau, MD    Discharge Diagnoses    Principal Problem:   Angina, class III [facility]) Active Problems:   Coronary artery disease involving native coronary artery of native heart with angina pectoris (Brian Dunn)   Hyperlipidemia with target LDL less than 70   Essential hypertension   COPD  Diagnostic Studies/Procedures    CORONARY STENT INTERVENTION  LEFT HEART CATH AND CORONARY ANGIOGRAPHY    Conclusion    Prox Cx lesion is 50% stenosed. Mid Cx lesion is 99% stenosed. Post intervention, there is a 0% residual stenosis. A drug-eluting stent was successfully placed using a STENT RESOLUTE ONYX 3.0X34. Post intervention, there is a 0% residual stenosis. Prox LAD to Mid LAD lesion is 40% stenosed with 60% stenosed side branch in 2nd Diag. Mid LAD lesion is 60% stenosed. Prox RCA lesion is 25% stenosed. There is hyperdynamic left ventricular systolic function. LV end diastolic pressure is normal. The left ventricular ejection fraction is greater than 65% by visual estimate. There is no aortic valve stenosis.     Severe single-vessel disease with 99% focal mid LCx lesion just downstream to previously stent along with ~50% ISR in the previously placed stent (CULPRIT LESION-PCI);  Successful DES PCI of mid LCx 99% lesion and overlapping the previously placed stent with 50+ percent ISR. Moderate mid LAD-2nd Diag disease best treated medically Hyperdynamic LV with normal EF.  Normal EDP     RECOMMENDATIONS With difficult femoral access after aborted radial access, we will keep the patient overnight for monitoring.  He did have a mild hematoma prior to his sheath exchanged from 6 [place] 7 [place] in the groin.  He was still having mild chest pain post PCI from high-pressure  post dilation. Continue aggressive risk modification-would consider possibly converting from pravastatin to more high potency statin.  He has intolerance to beta-blocker listed which may not preclude use of carvedilol.   Anticipate discharge tomorrow and to follow-up with Brian Dunn     Diagnostic Dominance: Right    Intervention     History of Present Illness     Brian Dunn is a 79 y.o. male with hx of CAD s/p remote stenting, HTN, HLD, COPD and GERD presented for outpatient cath.   Previously on BB likely Coreg, metoprolol and Bisoprolol >> ? DC due to COPD. Patient reports hx of intolerance to statin but not listed on allergies.   Recently dealing with chest tightness radiating to his neck. No significant improvement on nitro. Cardiac cath arranged for further evaluation.   Hospital Course     Consultants: None  1. CAD -Cath showed Severe single-vessel disease with 99% focal mid LCx lesion just downstream to previously stent along with ~50% ISR in the previously placed stent (CULPRIT LESION-PCI);  Successful DES PCI of mid LCx 99% lesion and overlapping the previously placed stent with 50+ percent ISR. Moderate mid LAD-2nd Diag disease best treated medically Hyperdynamic LV with normal EF.  Normal EDP  - Mild hematoma post cath which resolved next morning. HGb 11.7>>>9.6 post cath. Patient denies any pain at cath side. No hematoma. Will ambulate.   - Patient reported mild ongoing pain post cath 1/10 which improved significantly when compared prior to arrival.  Add low dose Imdur.   - Patient reported intolerance for BB. No clear  documentation on prior note. Will defer further discussion by primary cardiologist at outpatient.   - Continue DAPT with ASA and Plavix.   2. HLD - 01/12/2020: Cholesterol, Total 167; HDL 92; LDL Chol Calc (NIH) 62; Triglycerides 68  - Patient reported hx of statin intolerance but he does not remember drug name. Unable to find in  records.  - Will increase Pravastatin to 40mg  qd from 10mg  qd - Consider lipid panel and LFTS in 6 weeks as outpatient.  - Can add Zetia or refer to PCSK9 inhibitor given extensive CAD  3. HTN - BP stable on ARB - No change  4. Anemia -  Mild hematoma post cath which resolved next morning. HGb 11.7>>>9.6 post cath. - Repeat CBC at follow up on 1/19 - Reviewed alarming symptoms.   Did the patient have an acute coronary syndrome (MI, NSTEMI, STEMI, etc) this admission?:  No                               Did the patient have a percutaneous coronary intervention (stent / angioplasty)?:  Yes.    Cath/PCI Registry Performance & Quality Measures: Aspirin prescribed? - Yes ADP Receptor Inhibitor (Plavix/Clopidogrel, Brilinta/Ticagrelor or Effient/Prasugrel) prescribed (includes medically managed patients)? - Yes High Intensity Statin (Lipitor 40-80mg  or Crestor 20-40mg ) prescribed? - No - Statin intolerance  For EF <40%, was ACEI/ARB prescribed? - Yes For EF <40%, Aldosterone Antagonist (Spironolactone or Eplerenone) prescribed? - Not Applicable (EF >/= 95%) Cardiac Rehab Phase II ordered? - Yes    Discharge Vitals Blood pressure (!) 116/57, pulse 73, temperature 98.7 F (37.1 C), temperature source Oral, resp. rate 14, height 5' 10.5" (1.791 m), weight 71.7 kg, SpO2 95 %.  Filed Weights   11/16/20 1125  Weight: 71.7 kg    Physical Exam Constitutional:      Appearance: Normal appearance.  HENT:     Head: Normocephalic.     Nose: Nose normal.     Mouth/Throat:     Mouth: Mucous membranes are dry.  Eyes:     Extraocular Movements: Extraocular movements intact.     Pupils: Pupils are equal, round, and reactive to light.  Cardiovascular:     Rate and Rhythm: Normal rate and regular rhythm.     Pulses: Normal pulses.     Heart sounds: Normal heart sounds.     Comments: Right radial and groin cath site without hematoma Pulmonary:     Effort: Pulmonary effort is normal.      Breath sounds: Normal breath sounds.  Abdominal:     General: Abdomen is flat.     Palpations: Abdomen is soft.  Musculoskeletal:        General: Normal range of motion.     Cervical back: Normal range of motion.  Skin:    General: Skin is warm and dry.  Neurological:     General: No focal deficit present.     Mental Status: He is alert and oriented to person, place, and time.  Psychiatric:        Mood and Affect: Mood normal.     Labs & Radiologic Studies    CBC Recent Labs    11/17/20 0159  WBC 5.5  HGB 9.6*  HCT 27.5*  MCV 96.2  PLT 638   Basic Metabolic Panel Recent Labs    11/17/20 0159  NA 133*  K 3.7  CL 102  CO2 21*  GLUCOSE 82  BUN  13  CREATININE 0.84  CALCIUM 8.7*  _____________  CARDIAC CATHETERIZATION  Result Date: 11/16/2020  Prox Cx lesion is 50% stenosed.  Mid Cx lesion is 99% stenosed.  Post intervention, there is a 0% residual stenosis.  A drug-eluting stent was successfully placed using a STENT RESOLUTE ONYX 3.0X34.  Post intervention, there is a 0% residual stenosis.  Prox LAD to Mid LAD lesion is 40% stenosed with 60% stenosed side branch in 2nd Diag.  Mid LAD lesion is 60% stenosed.  Prox RCA lesion is 25% stenosed.  There is hyperdynamic left ventricular systolic function.  LV end diastolic pressure is normal.  The left ventricular ejection fraction is greater than 65% by visual estimate.  There is no aortic valve stenosis.   Severe single-vessel disease with 99% focal mid LCx lesion just downstream to previously stent along with ~50% ISR in the previously placed stent (CULPRIT LESION-PCI);  Successful DES PCI of mid LCx 99% lesion and overlapping the previously placed stent with 50+ percent ISR.  Moderate mid LAD-2nd Diag disease best treated medically  Hyperdynamic LV with normal EF.  Normal EDP RECOMMENDATIONS  With difficult femoral access after aborted radial access, we will keep the patient overnight for monitoring.  He did have  a mild hematoma prior to his sheath exchanged from 6 [place] in the groin.  He was still having mild chest pain post PCI from high-pressure post dilation.  Continue aggressive risk modification-would consider possibly converting from pravastatin to more high potency statin.  He has intolerance to beta-blocker listed which may not preclude use of carvedilol. Anticipate discharge tomorrow and to follow-up with Brian Dunn   Disposition   Pt is being discharged home today in good condition.  Follow-up Plans & Appointments     Follow-up Information     Revankar, Reita Cliche, MD. Go on 11/22/2020.   Specialty: Cardiology Why: @10 :20am for cath follow up  Contact information: [address] [place]  [place] [address] [phone]                Discharge Instructions     Diet - low sodium heart healthy   Complete by: As directed    Discharge instructions   Complete by: As directed    No driving for 72 hours No lifting over 5 lbs for 1 week. No sexual activity for 1 week. Keep procedure site clean & dry. If you notice increased pain, swelling, bleeding or pus, call/return!  You may shower, but no soaking baths/hot tubs/pools for 1 week.   Increase activity slowly   Complete by: As directed        Discharge Medications   Allergies as of 11/17/2020       Reactions   Metoprolol Other (See Comments)   Weight gain   Morphine    REACTION: rash   Penicillin G Swelling   Penicillins    REACTION: swelling   Propranolol    Unsure         Medication List     TAKE these medications    albuterol 108 (90 Base) MCG/ACT inhaler Commonly known as: ProAir HFA Inhale 2 puffs into the lungs every 6 (six) hours as needed for wheezing.   allopurinol 100 MG tablet Commonly known as: ZYLOPRIM Take 1 tablet by mouth daily.   aspirin EC 81 MG tablet Take 1 tablet (81 mg total) by mouth daily.   augmented betamethasone dipropionate 0.05 % ointment Commonly  known as: DIPROLENE-AF Apply  1 application topically as needed.   budesonide-formoterol 160-4.5 MCG/ACT inhaler Commonly known as: SYMBICORT Inhale 2 puffs into the lungs daily.   celecoxib 200 MG capsule Commonly known as: CELEBREX Take 200 mg by mouth daily.   clopidogrel 75 MG tablet Commonly known as: PLAVIX Take 1 tablet (75 mg total) by mouth daily with breakfast. Start taking on: November 18, 2020   Co Q-10 75 MG Caps Take 120 mg by mouth daily.   cyanocobalamin 50 MCG tablet Take 500 mcg by mouth daily.   famotidine 40 MG tablet Commonly known as: PEPCID Take 40 mg by mouth daily.   fluticasone 50 MCG/ACT nasal spray Commonly known as: FLONASE 2 sprays by Nasal route daily.   irbesartan 300 MG tablet Commonly known as: AVAPRO Take 300 mg by mouth daily.   isosorbide mononitrate 30 MG 24 hr tablet Commonly known as: IMDUR Take 0.5 tablets (15 mg total) by mouth daily.   loratadine 10 MG tablet Commonly known as: CLARITIN Take 10 mg by mouth daily.   Magnesium 400 MG Caps Take 400 mg by mouth in the morning, at noon, and at bedtime.   multivitamin capsule Take 1 capsule by mouth daily.   nitroGLYCERIN 0.4 MG SL tablet Commonly known as: NITROSTAT Place 1 tablet (0.4 mg total) under the tongue every 5 (five) minutes as needed for chest pain.   pravastatin 40 MG tablet Commonly known as: PRAVACHOL Take 1 tablet (40 mg total) by mouth daily. Please call to schedule appointment for further refills. What changed:  medication strength how much to take   primidone 50 MG tablet Commonly known as: MYSOLINE Take 50 mg by mouth at bedtime.   traMADol 50 MG tablet Commonly known as: ULTRAM Take 50 mg by mouth 2 (two) times daily as needed.   vitamin C 1000 MG tablet Take 500 mg by mouth daily.           Outstanding Labs/Studies   CBC at follow up   Duration of Discharge Encounter   Greater than 30 minutes including physician  time.  SignedLeanor Kail, PA 11/17/2020, 9:37 AM  I have examined the patient and reviewed assessment and plan and discussed with patient.  Agree with above as stated.  Right groin stable.  3+ pulse. No hematoma.  No swelling.  GEN: Well nourished, well developed, in no acute distress  HEENT: normal  Neck: no JVD, carotid bruits, or masses Cardiac: RRR; no murmurs, rubs, or gallops,no edema  Respiratory:  clear to auscultation bilaterally, normal work of breathing GI: soft, nontender, nondistended,  MS: no deformity or atrophy  Skin: warm and dry, no rash Neuro:  Strength and sensation are intact Psych: euthymic mood, full affect   COntinue DAPT with clopidogrel. I encouraged activity and cardiac rehab.Aggressive secondary prevention with lipid lowering therapy.   Larae Grooms

## 2020-11-17 NOTE — Progress Notes (Signed)
Discharged home accompanied by wife, belongings taken home. 

## 2020-11-17 NOTE — Progress Notes (Signed)
CARDIAC REHAB PHASE I   PRE:  Rate/Rhythm: 80 SR    BP: sitting 124/62    SaO2: 98 RA  MODE:  Ambulation: 340 ft   POST:  Rate/Rhythm: 101 ST    BP: sitting 138/68     SaO2: 95 RA  Pt moving well, no c/o groin issues. Does have back limitations to long distances. No CP. Discussed stent, restrictions, Plavix, diet, exercise, NTG and CRPII. Pt voiced understanding. Will refer to [facility].  [id number]   [place], ACSM 11/17/2020 10:28 AM

## 2020-11-22 ENCOUNTER — Other Ambulatory Visit: Payer: Self-pay

## 2020-11-22 ENCOUNTER — Ambulatory Visit: Payer: Medicare HMO | Admitting: Cardiology

## 2020-11-22 ENCOUNTER — Encounter: Payer: Self-pay | Admitting: Cardiology

## 2020-11-22 VITALS — BP 128/62 | HR 72 | Ht 70.5 in | Wt 158.8 lb

## 2020-11-22 DIAGNOSIS — I25119 Atherosclerotic heart disease of native coronary artery with unspecified angina pectoris: Secondary | ICD-10-CM | POA: Diagnosis not present

## 2020-11-22 DIAGNOSIS — E785 Hyperlipidemia, unspecified: Secondary | ICD-10-CM | POA: Diagnosis not present

## 2020-11-22 DIAGNOSIS — I10 Essential (primary) hypertension: Secondary | ICD-10-CM | POA: Diagnosis not present

## 2020-11-22 NOTE — Patient Instructions (Signed)
Medication Instructions:  No medication changes. *If you need a refill on your cardiac medications before your next appointment, please call your pharmacy*   Lab Work: Your physician recommends that you return for lab work in: before your next visit. You need to have labs done when you are fasting.  You can come Monday through Friday 8:30 am to 12:00 pm and 1:15 to 4:30. You do not need to make an appointment as the order has already been placed. The labs you are going to have done are BMET, LFT and Lipids.  If you have labs (blood work) drawn today and your tests are completely normal, you will receive your results only by: Marland Kitchen MyChart Message (if you have MyChart) OR . A paper copy in the mail If you have any lab test that is abnormal or we need to change your treatment, we will call you to review the results.   Testing/Procedures: None ordered   Follow-Up: At [facility], you and your health needs are our priority.  As part of our continuing mission to provide you with exceptional heart care, we have created designated Provider Care Teams.  These Care Teams include your primary Cardiologist (physician) and Advanced Practice Providers (APPs -  Physician Assistants and Nurse Practitioners) who all work together to provide you with the care you need, when you need it.  We recommend signing up for the patient portal called "MyChart".  Sign up information is provided on this After Visit Summary.  MyChart is used to connect with patients for Virtual Visits (Telemedicine).  Patients are able to view lab/test results, encounter notes, upcoming appointments, etc.  Non-urgent messages can be sent to your provider as well.   To learn more about what you can do with MyChart, go to [email or web address].    Your next appointment:   2 month(s)  The format for your next appointment:   In Person  Provider:   Jyl Heinz, MD   Other Instructions NA

## 2020-11-22 NOTE — Progress Notes (Signed)
Cardiology Office Note:    Date:  11/22/2020   ID:  Brian Dunn, DOB 02-16-1941, MRN [id number]  PCP:  Brian Sheriff, MD  Cardiologist:  Brian Lindau, MD   Referring MD: Brian Sheriff, MD    ASSESSMENT:    1. Coronary artery disease involving native coronary artery of native heart with angina pectoris ([place])   2. Essential hypertension   3. Hyperlipidemia with target LDL less than 70    PLAN:    In order of problems listed above:  1. Coronary artery disease: Secondary prevention stressed with the patient. Importance of compliance with diet medication stressed any vocalized understanding. Coronary angiography report was discussed with the patient at length and he vocalized understanding. 2. Essential hypertension: Blood pressure stable and diet was emphasized. 3. Mixed dyslipidemia: Diet was emphasized. He will be back for liver lipid check before his next visit in 2 months. I told him to walk at least half an hour a day on a regular basis and he promises to do so. 4. Patient will be seen in follow-up appointment in 6 months or earlier if the patient has any concerns    Medication Adjustments/Labs and Tests Ordered: Current medicines are reviewed at length with the patient today.  Concerns regarding medicines are outlined above.  No orders of the defined types were placed in this encounter.  No orders of the defined types were placed in this encounter.    No chief complaint on file.    History of Present Illness:    Brian Dunn is a 80 y.o. male. Patient has past medical history of coronary artery disease, essential hypertension dyslipidemia. He was evaluated by me for chest pain and his symptoms were very concerning. For this reason he was sent for coronary angiography the patient has significant disease and underwent stenting of the circumflex. Details of this note are mentioned below. Patient subsequently is resting and has not begun an exercise  program. No chest pain orthopnea or PND. He is happy with the outcome of the procedure as such. At the time of my evaluation, the patient is alert awake oriented and in no distress.  Past Medical History:  Diagnosis Date  . Acute bronchitis 05/29/2012  . Allergic rhinitis   . ALLERGIC RHINITIS 01/22/2007   Qualifier: Diagnosis of  By: Brian Kells MD, [place] pectoris ([place]) 11/07/2020  . Asthma 06/18/2012   Followed in Pulmonary clinic/ [facility]/ [place]  - 01/09/2018  After extensive coaching inhaler device  effectiveness =   75% > continue symb 160 2bid and return for pfts in 3 months ? RA bronchiolitis?  - PFT's  04/23/2018  FEV1 2.84 (91 % ) ratio 73  p no % improvement from saba p symb 160 prior to study with DLCO  76 % corrects to 79  % for alv volume   - 04/23/2018  After extensive coaching  . Bilateral inguinal hernia without obstruction or gangrene 06/20/2016  . CAD (coronary artery disease), native coronary artery 06/20/2016  . Chest pain, unspecified   . COPD (chronic obstructive pulmonary disease) ([place])   . Cough 03/25/2012   Followed in Pulmonary clinic/ [facility]/ [place]    - Trial off acei again  03/25/2012    - Sinus CT 01/24/11 There is mucosal thickening within the paranasal sinuses without  air-fluid levels. Neither ostiomeatal unit is patent.   . Cough variant asthma 10/18/2010   Followed in Pulmonary clinic/ [facility]/ [place]  -HFA 75% p coaching 01/22/2011  -PFT's  02/04/2011 minimal airflow obst, nl dlco   . Disorder of bone and cartilage 01/22/2007   Qualifier: Diagnosis of  By: Brian Kells MD, [place]   Overview:  Overview:  Qualifier: Diagnosis of  By: Brian Kells MD, [place] and other specified forms of tremor   . Essential hypertension 11/06/2017  . GERD 01/22/2007   Annotation: history of esophageal stricture Qualifier: Diagnosis of  By: Brian Kells MD, [place] GERD (gastroesophageal reflux disease)   . HIP FRACTURE 01/22/2007   Annotation: right-sided status post  surgery Qualifier: Diagnosis of  By: Brian Kells MD, [place] Hip fracture ([place])   . Hyperlipidemia 06/20/2016  . Hypertension   . HYPOGONADISM 02/18/2007   Qualifier: Diagnosis of  By: Brian Kells MD, [place] Hypogonadism male   . Low back pain 09/09/2013  . Multiple lung nodules on CT 11/21/2015   CT [place] 10/17/15 mpns > 15 y since quit smoking > rec 12 m f/u as this is low risk > done 06/04/16 no change rec recheck in 40m  - CT [place] 12/08/17 no change nodules > meets benign criteria > no directed f/u - Quantiferon GOLD TB 01/09/18 neg   . Osteopenia   . Personal history of malignant neoplasm of prostate   . Postoperative visit 08/14/2016  . Tremor, essential 02/18/2007   Overview:  Overview:  Qualifier: Diagnosis of  By: Brian Kells MD, [place]  Last Assessment & Plan:  Reviewed tremor may increase on dulera so will need to be balanced against benefits to cough    Past Surgical History:  Procedure Laterality Date  . CORONARY ANGIOPLASTY WITH STENT PLACEMENT  2008  . CORONARY STENT INTERVENTION N/A 11/16/2020   Procedure: CORONARY STENT INTERVENTION;  Surgeon: Brian Man, MD;  Location: [place] [facility];  Service: Cardiovascular;  Laterality: N/A;  . INSERTION PROSTATE RADIATION SEED    . LEFT HEART CATH AND CORONARY ANGIOGRAPHY N/A 11/16/2020   Procedure: LEFT HEART CATH AND CORONARY ANGIOGRAPHY;  Surgeon: Brian Man, MD;  Location: [place] [facility];  Service: Cardiovascular;  Laterality: N/A;    Current Medications: Current Meds  Medication Sig  . albuterol (PROAIR HFA) 108 (90 BASE) MCG/ACT inhaler Inhale 2 puffs into the lungs every 6 (six) hours as needed for wheezing.  Marland Kitchen allopurinol (ZYLOPRIM) 100 MG tablet Take 1 tablet by mouth daily.   . Ascorbic Acid (VITAMIN C) 1000 MG tablet Take 500 mg by mouth daily.  Marland Kitchen aspirin EC 81 MG tablet Take 1 tablet (81 mg total) by mouth daily.  Marland Kitchen augmented betamethasone dipropionate (DIPROLENE-AF) 0.05 % ointment Apply 1 application topically as  needed.   . budesonide-formoterol (SYMBICORT) 160-4.5 MCG/ACT inhaler Inhale 2 puffs into the lungs daily.  . celecoxib (CELEBREX) 200 MG capsule Take 200 mg by mouth daily.   . clopidogrel (PLAVIX) 75 MG tablet Take 1 tablet (75 mg total) by mouth daily with breakfast.  . Coenzyme Q10 (CO Q-10) 75 MG CAPS Take 120 mg by mouth daily.  . cyanocobalamin 50 MCG tablet Take 500 mcg by mouth daily.   . famotidine (PEPCID) 40 MG tablet Take 40 mg by mouth daily.  . fluticasone (FLONASE) 50 MCG/ACT nasal spray 2 sprays by Nasal route daily.  . irbesartan (AVAPRO) 300 MG tablet Take 300 mg by mouth daily.   . isosorbide mononitrate (IMDUR) 30 MG 24 hr tablet Take 0.5  tablets (15 mg total) by mouth daily.  Marland Kitchen loratadine (CLARITIN) 10 MG tablet Take 10 mg by mouth daily.   . Magnesium 400 MG CAPS Take 400 mg by mouth in the morning, at noon, and at bedtime.   . Multiple Vitamin (MULTIVITAMIN) capsule Take 1 capsule by mouth daily.  . nitroGLYCERIN (NITROSTAT) 0.4 MG SL tablet Place 1 tablet (0.4 mg total) under the tongue every 5 (five) minutes as needed for chest pain.  . pravastatin (PRAVACHOL) 40 MG tablet Take 1 tablet (40 mg total) by mouth daily. Please call to schedule appointment for further refills.  . primidone (MYSOLINE) 50 MG tablet Take 50 mg by mouth at bedtime.   . traMADol (ULTRAM) 50 MG tablet Take 50 mg by mouth 2 (two) times daily as needed.     Allergies:   Metoprolol, Morphine, Penicillin g, Penicillins, and Propranolol   Social History   Socioeconomic History  . Marital status: Married    Spouse name: Not on file  . Number of children: Not on file  . Years of education: Not on file  . Highest education level: Not on file  Occupational History  . Not on file  Tobacco Use  . Smoking status: Former Smoker    Packs/day: 1.00    Years: 35.00    Pack years: 35.00    Types: Cigarettes    Quit date: 11/04/1996    Years since quitting: 24.0  . Smokeless tobacco: Never Used   Substance and Sexual Activity  . Alcohol use: Not on file    Comment: ETOH daily  . Drug use: Yes  . Sexual activity: Not on file  Other Topics Concern  . Not on file  Social History Narrative   Married and has children.  Works in Press [employer].   Social Determinants of Health   Financial Resource Strain: Not on file  Food Insecurity: Not on file  Transportation Needs: Not on file  Physical Activity: Not on file  Stress: Not on file  Social Connections: Not on file     Family History: The patient's family history includes Asthma in his father and sister; Heart disease in his mother and sister; Parkinsonism in his father; Throat cancer in his sister.  ROS:   Please see the history of present illness.    All other systems reviewed and are negative.  EKGs/Labs/Other Studies Reviewed:    The following studies were reviewed today: CORONARY STENT INTERVENTION  LEFT HEART CATH AND CORONARY ANGIOGRAPHY    Conclusion    Prox Cx lesion is 50% stenosed.  Mid Cx lesion is 99% stenosed.  Post intervention, there is a 0% residual stenosis.  A drug-eluting stent was successfully placed using a STENT RESOLUTE ONYX 3.0X34.  Post intervention, there is a 0% residual stenosis.  Prox LAD to Mid LAD lesion is 40% stenosed with 60% stenosed side branch in 2nd Diag.  Mid LAD lesion is 60% stenosed.  Prox RCA lesion is 25% stenosed.  There is hyperdynamic left ventricular systolic function.  LV end diastolic pressure is normal.  The left ventricular ejection fraction is greater than 65% by visual estimate.  There is no aortic valve stenosis.     Severe single-vessel disease with 99% focal mid LCx lesion just downstream to previously stent along with ~50% ISR in the previously placed stent (CULPRIT LESION-PCI);  ? Successful DES PCI of mid LCx 99% lesion and overlapping the previously placed stent with 50+ percent ISR.  Moderate mid LAD-2nd Diag disease best  treated  medically  Hyperdynamic LV with normal EF.  Normal EDP   RECOMMENDATIONS  With difficult femoral access after aborted radial access, we will keep the patient overnight for monitoring.  He did have a mild hematoma prior to his sheath exchanged from 6 [place] 7 [place] in the groin.  He was still having mild chest pain post PCI from high-pressure post dilation.  Continue aggressive risk modification-would consider possibly converting from pravastatin to more high potency statin.  He has intolerance to beta-blocker listed which may not preclude use of carvedilol.  Anticipate discharge tomorrow and to follow-up with Dr. Geraldo Pitter    Recent Labs: 01/12/2020: ALT 18; TSH 1.860 11/17/2020: BUN 13; Creatinine, Ser 0.84; Hemoglobin 9.6; Platelets 221; Potassium 3.7; Sodium 133  Recent Lipid Panel    Component Value Date/Time   CHOL 167 01/12/2020 1005   TRIG 68 01/12/2020 1005   HDL 92 01/12/2020 1005   CHOLHDL 1.8 01/12/2020 1005   CHOLHDL 2.5 CALC 01/22/2007 0923   VLDL 19 01/22/2007 0923   LDLCALC 62 01/12/2020 1005   LDLDIRECT 117.9 01/22/2007 0923    Physical Exam:    VS:  BP 128/62   Pulse 72   Ht 5' 10.5" (1.791 m)   Wt 158 lb 12.8 oz (72 kg)   SpO2 96%   BMI 22.46 kg/m     Wt Readings from Last 3 Encounters:  11/22/20 158 lb 12.8 oz (72 kg)  11/16/20 158 lb (71.7 kg)  11/07/20 159 lb 6.4 oz (72.3 kg)     GEN: Patient is in no acute distress HEENT: Normal NECK: No JVD; No carotid bruits LYMPHATICS: No lymphadenopathy CARDIAC: Hear sounds regular, 2/6 systolic murmur at the apex. RESPIRATORY:  Clear to auscultation without rales, wheezing or rhonchi  ABDOMEN: Soft, non-tender, non-distended MUSCULOSKELETAL:  No edema; No deformity  SKIN: Warm and dry NEUROLOGIC:  Alert and oriented x 3 PSYCHIATRIC:  Normal affect   Signed, Brian Lindau, MD  11/22/2020 10:32 AM    [place]

## 2020-11-24 ENCOUNTER — Telehealth (HOSPITAL_COMMUNITY): Payer: Self-pay

## 2020-11-24 NOTE — Telephone Encounter (Signed)
Per phase I, faxed cardiac rehab referral to  cardiac rehab. °

## 2020-12-03 DIAGNOSIS — M1990 Unspecified osteoarthritis, unspecified site: Secondary | ICD-10-CM | POA: Diagnosis not present

## 2020-12-03 DIAGNOSIS — I10 Essential (primary) hypertension: Secondary | ICD-10-CM | POA: Diagnosis not present

## 2020-12-03 DIAGNOSIS — K219 Gastro-esophageal reflux disease without esophagitis: Secondary | ICD-10-CM | POA: Diagnosis not present

## 2020-12-18 ENCOUNTER — Ambulatory Visit: Payer: Medicare HMO | Admitting: Cardiology

## 2021-01-24 DIAGNOSIS — E785 Hyperlipidemia, unspecified: Secondary | ICD-10-CM | POA: Diagnosis not present

## 2021-01-24 DIAGNOSIS — I25119 Atherosclerotic heart disease of native coronary artery with unspecified angina pectoris: Secondary | ICD-10-CM | POA: Diagnosis not present

## 2021-01-24 DIAGNOSIS — I10 Essential (primary) hypertension: Secondary | ICD-10-CM | POA: Diagnosis not present

## 2021-01-24 LAB — LIPID PANEL
Chol/HDL Ratio: 1.8 ratio (ref 0.0–5.0)
Cholesterol, Total: 144 mg/dL (ref 100–199)
HDL: 79 mg/dL (ref >39)
LDL Chol Calc (NIH): 54 mg/dL (ref 0–99)
Triglycerides: 49 mg/dL (ref 0–149)
VLDL Cholesterol Cal: 11 mg/dL (ref 5–40)

## 2021-01-24 LAB — HEPATIC FUNCTION PANEL
ALT: 16 IU/L (ref 0–44)
AST: 26 IU/L (ref 0–40)
Albumin: 4.3 g/dL (ref 3.7–4.7)
Alkaline Phosphatase: 67 IU/L (ref 44–121)
Bilirubin Total: 0.4 mg/dL (ref 0.0–1.2)
Bilirubin, Direct: 0.13 mg/dL (ref 0.00–0.40)
Total Protein: 7.1 g/dL (ref 6.0–8.5)

## 2021-01-24 LAB — BASIC METABOLIC PANEL
BUN/Creatinine Ratio: 14 (ref 10–24)
BUN: 15 mg/dL (ref 8–27)
CO2: 20 mmol/L (ref 20–29)
Calcium: 9 mg/dL (ref 8.6–10.2)
Chloride: 94 mmol/L — ABNORMAL LOW (ref 96–106)
Creatinine, Ser: 1.04 mg/dL (ref 0.76–1.27)
Glucose: 83 mg/dL (ref 65–99)
Potassium: 4.9 mmol/L (ref 3.5–5.2)
Sodium: 129 mmol/L — ABNORMAL LOW (ref 134–144)
eGFR: 73 mL/min/{1.73_m2} (ref >59)

## 2021-01-25 ENCOUNTER — Telehealth: Payer: Self-pay | Admitting: Cardiology

## 2021-01-25 NOTE — Telephone Encounter (Signed)
Results reviewed with pt as per Dr. Revankar's note.  Pt verbalized understanding and had no additional questions. Routed to PCP.  

## 2021-01-25 NOTE — Telephone Encounter (Signed)
Patient returning call for lab results. 

## 2021-01-29 DIAGNOSIS — E7800 Pure hypercholesterolemia, unspecified: Secondary | ICD-10-CM | POA: Diagnosis not present

## 2021-01-29 DIAGNOSIS — I10 Essential (primary) hypertension: Secondary | ICD-10-CM | POA: Insufficient documentation

## 2021-01-29 DIAGNOSIS — Z1331 Encounter for screening for depression: Secondary | ICD-10-CM | POA: Diagnosis not present

## 2021-01-29 DIAGNOSIS — Z0000 Encounter for general adult medical examination without abnormal findings: Secondary | ICD-10-CM | POA: Diagnosis not present

## 2021-01-29 DIAGNOSIS — Z6821 Body mass index (BMI) 21.0-21.9, adult: Secondary | ICD-10-CM | POA: Diagnosis not present

## 2021-01-29 DIAGNOSIS — Z79899 Other long term (current) drug therapy: Secondary | ICD-10-CM | POA: Diagnosis not present

## 2021-01-29 DIAGNOSIS — M8580 Other specified disorders of bone density and structure, unspecified site: Secondary | ICD-10-CM | POA: Diagnosis not present

## 2021-01-29 DIAGNOSIS — J309 Allergic rhinitis, unspecified: Secondary | ICD-10-CM | POA: Insufficient documentation

## 2021-01-30 ENCOUNTER — Encounter: Payer: Self-pay | Admitting: Cardiology

## 2021-01-30 ENCOUNTER — Other Ambulatory Visit: Payer: Self-pay

## 2021-01-30 ENCOUNTER — Ambulatory Visit: Payer: Medicare HMO | Admitting: Cardiology

## 2021-01-30 VITALS — BP 132/70 | HR 66 | Ht 70.6 in | Wt 154.0 lb

## 2021-01-30 DIAGNOSIS — I25119 Atherosclerotic heart disease of native coronary artery with unspecified angina pectoris: Secondary | ICD-10-CM | POA: Diagnosis not present

## 2021-01-30 DIAGNOSIS — E785 Hyperlipidemia, unspecified: Secondary | ICD-10-CM | POA: Diagnosis not present

## 2021-01-30 DIAGNOSIS — I10 Essential (primary) hypertension: Secondary | ICD-10-CM

## 2021-01-30 NOTE — Progress Notes (Signed)
Cardiology Office Note:    Date:  01/30/2021   ID:  Tamala Ser [place], DOB [birth year], MRN [id number]  PCP:  Angelina Sheriff, MD  Cardiologist:  Jenean Lindau, MD   Referring MD: Angelina Sheriff, MD    ASSESSMENT:    1. Coronary artery disease involving native coronary artery of native heart with angina pectoris ([place])   2. Hyperlipidemia with target LDL less than 70   3. Essential hypertension    PLAN:    In order of problems listed above:  1. Coronary artery disease: Secondary prevention stressed with patient.  Importance of compliance with diet medication stressed any vocalized understanding.  I told him to walk to the best of his ability.  He promises to do so. 2. Essential hypertension: Blood pressure stable and diet was emphasized.  Lifestyle modification urged. 3. Mixed dyslipidemia: I discussed lipids with him and they were drawn recently.  He is happy about the numbers.  Diet was emphasized again. 4. Coronary angiography report was discussed with him at length and questions were answered to his satisfaction. 5. Patient will be seen in follow-up appointment in 6 months or earlier if the patient has any concerns    Medication Adjustments/Labs and Tests Ordered: Current medicines are reviewed at length with the patient today.  Concerns regarding medicines are outlined above.  No orders of the defined types were placed in this encounter.  No orders of the defined types were placed in this encounter.    No chief complaint on file.    History of Present Illness:    Omarian Jaquith is a 80 y.o. male.  Patient has past medical history of coronary artery disease, essential hypertension dyslipidemia.  He denies any problems at this time and takes care of activities of daily living.  No chest pain orthopnea or PND.  He recently was evaluated for chest pain and underwent coronary intervention and the results are discussed below.  He cannot walk much because of  issues with his spine.  At the time of my evaluation, the patient is alert awake oriented and in no distress.  Past Medical History:  Diagnosis Date  . Acute bronchitis 05/29/2012  . Allergic rhinitis   . ALLERGIC RHINITIS 01/22/2007   Qualifier: Diagnosis of  By: Larose Kells MD, [place] Allergic rhinitis   . Angina pectoris ([place]) 11/07/2020  . Angina, class III ([place]) 11/07/2020  . Asthma 06/18/2012   Followed in Pulmonary clinic/ [facility]/ [place]  - 01/09/2018  After extensive coaching inhaler device  effectiveness =   75% > continue symb 160 2bid and return for pfts in 3 months ? RA bronchiolitis?  - PFT's  04/23/2018  FEV1 2.84 (91 % ) ratio 73  p no % improvement from saba p symb 160 prior to study with DLCO  76 % corrects to 79  % for alv volume   - 04/23/2018  After extensive coaching  . Bilateral inguinal hernia without obstruction or gangrene 06/20/2016  . CAD (coronary artery disease), native coronary artery 06/20/2016  . Chest pain, unspecified   . COPD (chronic obstructive pulmonary disease) ([place])   . Coronary artery disease involving native coronary artery of native heart with angina pectoris ([place]) 06/20/2016  . Cough 03/25/2012   Followed in Pulmonary clinic/ [facility]/ [place]    - Trial off acei again  03/25/2012    - Sinus CT 01/24/11 There is mucosal thickening within the paranasal sinuses without  air-fluid levels. Neither ostiomeatal unit is patent.   . Cough variant asthma 10/18/2010   Followed in Pulmonary clinic/ [facility]/ [place]  -HFA 75% p coaching 01/22/2011  -PFT's  02/04/2011 minimal airflow obst, nl dlco   . Disorder of bone and cartilage 01/22/2007   Qualifier: Diagnosis of  By: Larose Kells MD, [place]   Overview:  Overview:  Qualifier: Diagnosis of  By: Larose Kells MD, [place] and other specified forms of tremor   . Essential hypertension 11/06/2017  . GERD 01/22/2007   Annotation: history of esophageal stricture Qualifier: Diagnosis of  By: Larose Kells MD, [place] GERD  (gastroesophageal reflux disease)   . HIP FRACTURE 01/22/2007   Annotation: right-sided status post surgery Qualifier: Diagnosis of  By: Larose Kells MD, [place] Hip fracture ([place])   . Hyperlipidemia 06/20/2016  . Hyperlipidemia with target LDL less than 70 06/20/2016  . Hypertension   . HYPOGONADISM 02/18/2007   Qualifier: Diagnosis of  By: Larose Kells MD, [place] Hypogonadism male   . Low back pain 09/09/2013  . Multiple lung nodules on CT 11/21/2015   CT [place] 10/17/15 mpns > 15 y since quit smoking > rec 12 m f/u as this is low risk > done 06/04/16 no change rec recheck in 21m  - CT [place] 12/08/17 no change nodules > meets benign criteria > no directed f/u - Quantiferon GOLD TB 01/09/18 neg   . Osteopenia   . Personal history of malignant neoplasm of prostate   . Postoperative visit 08/14/2016  . Tremor, essential 02/18/2007   Overview:  Overview:  Qualifier: Diagnosis of  By: Larose Kells MD, [place]  Last Assessment & Plan:  Reviewed tremor may increase on dulera so will need to be balanced against benefits to cough    Past Surgical History:  Procedure Laterality Date  . CORONARY ANGIOPLASTY WITH STENT PLACEMENT  2008  . CORONARY STENT INTERVENTION N/A 11/16/2020   Procedure: CORONARY STENT INTERVENTION;  Surgeon: Leonie Man, MD;  Location: [facility];  Service: Cardiovascular;  Laterality: N/A;  . INSERTION PROSTATE RADIATION SEED    . LEFT HEART CATH AND CORONARY ANGIOGRAPHY N/A 11/16/2020   Procedure: LEFT HEART CATH AND CORONARY ANGIOGRAPHY;  Surgeon: Leonie Man, MD;  Location: [facility];  Service: Cardiovascular;  Laterality: N/A;    Current Medications: Current Meds  Medication Sig  . albuterol (PROAIR HFA) 108 (90 BASE) MCG/ACT inhaler Inhale 2 puffs into the lungs every 6 (six) hours as needed for wheezing.  Marland Kitchen allopurinol (ZYLOPRIM) 100 MG tablet Take 1 tablet by mouth daily.   . Ascorbic Acid (VITAMIN C) 1000 MG tablet Take 500 mg by mouth daily.  Marland Kitchen aspirin EC 81  MG tablet Take 1 tablet (81 mg total) by mouth daily.  . budesonide-formoterol (SYMBICORT) 160-4.5 MCG/ACT inhaler Inhale 2 puffs into the lungs daily.  . celecoxib (CELEBREX) 200 MG capsule Take 200 mg by mouth daily.   . clopidogrel (PLAVIX) 75 MG tablet Take 1 tablet (75 mg total) by mouth daily with breakfast.  . Coenzyme Q10 (CO Q-10) 75 MG CAPS Take 120 mg by mouth daily.  . famotidine (PEPCID) 40 MG tablet Take 40 mg by mouth daily.  . fluticasone (FLONASE) 50 MCG/ACT nasal spray 2 sprays by Nasal route daily.  . irbesartan (AVAPRO) 300 MG tablet Take 300 mg by mouth daily.   . isosorbide mononitrate (IMDUR) 30 MG 24 hr tablet  Take 0.5 tablets (15 mg total) by mouth daily.  Marland Kitchen loratadine (CLARITIN) 10 MG tablet Take 10 mg by mouth daily.   . Magnesium 400 MG CAPS Take 400 mg by mouth 2 (two) times daily.  . Multiple Vitamin (MULTIVITAMIN) capsule Take 1 capsule by mouth daily.  . nitroGLYCERIN (NITROSTAT) 0.4 MG SL tablet Place 1 tablet (0.4 mg total) under the tongue every 5 (five) minutes as needed for chest pain.  . pravastatin (PRAVACHOL) 40 MG tablet Take 40 mg by mouth daily.  . primidone (MYSOLINE) 50 MG tablet Take 50 mg by mouth at bedtime.   . traMADol (ULTRAM) 50 MG tablet Take 50 mg by mouth 2 (two) times daily as needed for moderate pain.  . vitamin B-12 (CYANOCOBALAMIN) 500 MCG tablet Take 500 mcg by mouth daily.     Allergies:   Metoprolol, Morphine, Penicillin g, Penicillins, and Propranolol   Social History   Socioeconomic History  . Marital status: Married    Spouse name: Not on file  . Number of children: Not on file  . Years of education: Not on file  . Highest education level: Not on file  Occupational History  . Not on file  Tobacco Use  . Smoking status: Former Smoker    Packs/day: 1.00    Years: 35.00    Pack years: 35.00    Types: Cigarettes    Quit date: 11/04/1996    Years since quitting: 24.2  . Smokeless tobacco: Never Used  Substance and  Sexual Activity  . Alcohol use: Not on file    Comment: ETOH daily  . Drug use: Yes  . Sexual activity: Not on file  Other Topics Concern  . Not on file  Social History Narrative   Married and has children.  Works in Press photographer.   Social Determinants of Health   Financial Resource Strain: Not on file  Food Insecurity: Not on file  Transportation Needs: Not on file  Physical Activity: Not on file  Stress: Not on file  Social Connections: Not on file     Family History: The patient's family history includes Asthma in his father and sister; Heart disease in his mother and sister; Parkinsonism in his father; Throat cancer in his sister.  ROS:   Please see the history of present illness.    All other systems reviewed and are negative.  EKGs/Labs/Other Studies Reviewed:    The following studies were reviewed today: CORONARY STENT INTERVENTION  LEFT HEART CATH AND CORONARY ANGIOGRAPHY    Conclusion    Prox Cx lesion is 50% stenosed.  Mid Cx lesion is 99% stenosed.  Post intervention, there is a 0% residual stenosis.  A drug-eluting stent was successfully placed using a STENT RESOLUTE ONYX 3.0X34.  Post intervention, there is a 0% residual stenosis.  Prox LAD to Mid LAD lesion is 40% stenosed with 60% stenosed side branch in 2nd Diag.  Mid LAD lesion is 60% stenosed.  Prox RCA lesion is 25% stenosed.  There is hyperdynamic left ventricular systolic function.  LV end diastolic pressure is normal.  The left ventricular ejection fraction is greater than 65% by visual estimate.  There is no aortic valve stenosis.     Severe single-vessel disease with 99% focal mid LCx lesion just downstream to previously stent along with ~50% ISR in the previously placed stent (CULPRIT LESION-PCI);  ? Successful DES PCI of mid LCx 99% lesion and overlapping the previously placed stent with 50+ percent ISR.  Moderate mid LAD-2nd  Diag disease best treated medically  Hyperdynamic LV  with normal EF.  Normal EDP   RECOMMENDATIONS  With difficult femoral access after aborted radial access, we will keep the patient overnight for monitoring.  He did have a mild hematoma prior to his sheath exchanged from 6 [place] 7 [place] in the groin.  He was still having mild chest pain post PCI from high-pressure post dilation.  Continue aggressive risk modification-would consider possibly converting from pravastatin to more high potency statin.  He has intolerance to beta-blocker listed which may not preclude use of carvedilol.  Anticipate discharge tomorrow and to follow-up with Dr. Geraldo Pitter    Recent Labs: 11/17/2020: Hemoglobin 9.6; Platelets 221 01/24/2021: ALT 16; BUN 15; Creatinine, Ser 1.04; Potassium 4.9; Sodium 129  Recent Lipid Panel    Component Value Date/Time   CHOL 144 01/24/2021 0843   TRIG 49 01/24/2021 0843   HDL 79 01/24/2021 0843   CHOLHDL 1.8 01/24/2021 0843   CHOLHDL 2.5 CALC 01/22/2007 0923   VLDL 19 01/22/2007 0923   LDLCALC 54 01/24/2021 0843   LDLDIRECT 117.9 01/22/2007 0923    Physical Exam:    VS:  BP 132/70   Pulse 66   Ht 5' 10.6" (1.793 m)   Wt 154 lb (69.9 kg)   SpO2 99%   BMI 21.72 kg/m     Wt Readings from Last 3 Encounters:  01/30/21 154 lb (69.9 kg)  11/22/20 158 lb 12.8 oz (72 kg)  11/16/20 158 lb (71.7 kg)     GEN: Patient is in no acute distress HEENT: Normal NECK: No JVD; No carotid bruits LYMPHATICS: No lymphadenopathy CARDIAC: Hear sounds regular, 2/6 systolic murmur at the apex. RESPIRATORY:  Clear to auscultation without rales, wheezing or rhonchi  ABDOMEN: Soft, non-tender, non-distended MUSCULOSKELETAL:  No edema; No deformity  SKIN: Warm and dry NEUROLOGIC:  Alert and oriented x 3 PSYCHIATRIC:  Normal affect   Signed, Jenean Lindau, MD  01/30/2021 10:14 AM    [place]

## 2021-01-30 NOTE — Patient Instructions (Signed)
Medication Instructions:  No medication changes. *If you need a refill on your cardiac medications before your next appointment, please call your pharmacy*   Lab Work: Your physician recommends that you return for lab work in: before your next visit. You need to have labs done when you are fasting.  You can come Monday through Friday 8:30 am to 12:00 pm and 1:15 to 4:30. You do not need to make an appointment as the order has already been placed. The labs you are going to have done are BMET, CBC, TSH, LFT and Lipids.  If you have labs (blood work) drawn today and your tests are completely normal, you will receive your results only by: Marland Kitchen MyChart Message (if you have MyChart) OR . A paper copy in the mail If you have any lab test that is abnormal or we need to change your treatment, we will call you to review the results.   Testing/Procedures: None ordered   Follow-Up: At [facility], you and your health needs are our priority.  As part of our continuing mission to provide you with exceptional heart care, we have created designated Provider Care Teams.  These Care Teams include your primary Cardiologist (physician) and Advanced Practice Providers (APPs -  Physician Assistants and Nurse Practitioners) who all work together to provide you with the care you need, when you need it.  We recommend signing up for the patient portal called "MyChart".  Sign up information is provided on this After Visit Summary.  MyChart is used to connect with patients for Virtual Visits (Telemedicine).  Patients are able to view lab/test results, encounter notes, upcoming appointments, etc.  Non-urgent messages can be sent to your provider as well.   To learn more about what you can do with MyChart, go to [email or web address].    Your next appointment:   6 month(s)  The format for your next appointment:   In Person  Provider:   Jyl Heinz, MD   Other Instructions NA

## 2021-01-31 DIAGNOSIS — M109 Gout, unspecified: Secondary | ICD-10-CM | POA: Diagnosis not present

## 2021-01-31 DIAGNOSIS — I10 Essential (primary) hypertension: Secondary | ICD-10-CM | POA: Diagnosis not present

## 2021-01-31 DIAGNOSIS — E7800 Pure hypercholesterolemia, unspecified: Secondary | ICD-10-CM | POA: Diagnosis not present

## 2021-02-08 DIAGNOSIS — E871 Hypo-osmolality and hyponatremia: Secondary | ICD-10-CM | POA: Diagnosis not present

## 2021-02-26 ENCOUNTER — Other Ambulatory Visit: Payer: Self-pay | Admitting: Oncology

## 2021-02-26 DIAGNOSIS — N189 Chronic kidney disease, unspecified: Secondary | ICD-10-CM

## 2021-02-26 DIAGNOSIS — D631 Anemia in chronic kidney disease: Secondary | ICD-10-CM

## 2021-02-26 NOTE — Progress Notes (Signed)
[facility]  [address] [place],  [place]  [address] [phone]  Clinic Day:  02/27/2021  Referring physician: Angelina Sheriff, MD   HISTORY OF PRESENT ILLNESS:  The patient is a 80 y.o. male with anemia secondary to mild renal insufficiency.  Despite having anemia, his hemoglobin has consistently been above 11 to where it has been followed without needing any particular intervention.  He comes in today for routine follow up.  Since his last visit, the patient has been doing okay.  He complains of having increased fatigue.  He denies having any overt forms of blood loss.  His wife brings to my attention he had a 3-inch stent placed in his left coronary arterial circulation.    PHYSICAL EXAM:  Blood pressure (!) 145/69, pulse 67, temperature 98.1 F (36.7 C), resp. rate 16, height 5\' 10"  (1.778 m), weight 149 lb (67.6 kg), SpO2 99 %. Wt Readings from Last 3 Encounters:  02/27/21 149 lb (67.6 kg)  01/30/21 154 lb (69.9 kg)  11/22/20 158 lb 12.8 oz (72 kg)   Body mass index is 21.38 kg/m. Performance status (ECOG): 1 Physical Exam Constitutional:      Appearance: Normal appearance. He is not ill-appearing.  HENT:     Mouth/Throat:     Mouth: Mucous membranes are moist.     Pharynx: Oropharynx is clear. No oropharyngeal exudate or posterior oropharyngeal erythema.  Cardiovascular:     Rate and Rhythm: Normal rate and regular rhythm.     Heart sounds: No murmur heard. No friction rub. No gallop.   Pulmonary:     Effort: Pulmonary effort is normal. No respiratory distress.     Breath sounds: Normal breath sounds. No wheezing, rhonchi or rales.  Chest:  Breasts:     Right: No axillary adenopathy or supraclavicular adenopathy.     Left: No axillary adenopathy or supraclavicular adenopathy.    Abdominal:     General: Bowel sounds are normal. There is no distension.     Palpations: Abdomen is soft. There is no mass.     Tenderness: There  is no abdominal tenderness.  Musculoskeletal:        General: No swelling.     Right lower leg: No edema.     Left lower leg: No edema.  Lymphadenopathy:     Cervical: No cervical adenopathy.     Upper Body:     Right upper body: No supraclavicular or axillary adenopathy.     Left upper body: No supraclavicular or axillary adenopathy.     Lower Body: No right inguinal adenopathy. No left inguinal adenopathy.  Skin:    General: Skin is warm.     Coloration: Skin is not jaundiced.     Findings: No lesion or rash.  Neurological:     General: No focal deficit present.     Mental Status: He is alert and oriented to person, place, and time. Mental status is at baseline.     Cranial Nerves: Cranial nerves are intact.  Psychiatric:        Mood and Affect: Mood normal.        Behavior: Behavior normal.        Thought Content: Thought content normal.    LABS:   CBC Latest Ref Rng & Units 02/27/2021 11/17/2020 11/07/2020  WBC - 5.5 5.5 8.5  Hemoglobin 13.5 - 17.5 10.6(A) 9.6(L) 11.7(L)  Hematocrit 41 - 53 32(A) 27.5(L) 33.5(L)  Platelets 150 - 399  294 221 280   CMP Latest Ref Rng & Units 02/27/2021 01/24/2021 11/17/2020  Glucose 65 - 99 mg/dL - 83 82  BUN 4 - 21 16 15 13   Creatinine 0.6 - 1.3 0.8 1.04 0.84  Sodium 137 - 147 129(A) 129(L) 133(L)  Potassium 3.4 - 5.3 4.9 4.9 3.7  Chloride 99 - 108 97(A) 94(L) 102  CO2 13 - 22 23(A) 20 21(L)  Calcium 8.7 - 10.7 8.9 9.0 8.7(L)  Total Protein 6.0 - 8.5 g/dL - 7.1 -  Total Bilirubin 0.0 - 1.2 mg/dL - 0.4 -  Alkaline Phos 25 - 125 72 67 -  AST 14 - 40 43(A) 26 -  ALT 10 - 40 19 16 -    Ref. Range 02/27/2021 10:40 02/27/2021 11:11  Iron Latest Ref Range: 45 - 182 ug/dL 64   UIBC Latest Units: ug/dL 223   TIBC Latest Ref Range: 250 - 450 ug/dL 287   Saturation Ratios Latest Ref Range: 17.9 - 39.5 % 22   Ferritin Latest Ref Range: 24 - 336 ng/mL 197   Folate Latest Ref Range: >5.9 ng/mL  41.1  Vitamin B12 Latest Ref Range: 180 - 914 pg/mL   1,066 (H)    ASSESSMENT & PLAN:  Assessment/Plan:  A 80 y.o. male with anemia secondary to mild renal insufficiency.  His hemoglobin has dropped since his last visit.  However, his labs today show no evidence of any nutritional deficiencies.  His kidney parameters today are also normal.  As his hemoglobin remains above 10, he will be followed conservatively.  I am concerned that his fatigue may also represent a symptom from his underlying heart disease.  He knows it will be important to stay in contact with his cardiologist to ensure his symptoms are not related to his underlying heart disease.  I will see this patient back in 3 months for repeat clinical assessment.  The patient understands all the plans discussed today and is in agreement with them.     Hilario Robarts Macarthur Critchley, MD

## 2021-02-27 ENCOUNTER — Inpatient Hospital Stay: Payer: Medicare HMO | Admitting: Oncology

## 2021-02-27 ENCOUNTER — Other Ambulatory Visit: Payer: Self-pay | Admitting: Hematology and Oncology

## 2021-02-27 ENCOUNTER — Inpatient Hospital Stay: Payer: Medicare HMO | Attending: Oncology

## 2021-02-27 ENCOUNTER — Other Ambulatory Visit: Payer: Self-pay

## 2021-02-27 ENCOUNTER — Telehealth: Payer: Self-pay

## 2021-02-27 ENCOUNTER — Telehealth: Payer: Self-pay | Admitting: Oncology

## 2021-02-27 ENCOUNTER — Other Ambulatory Visit: Payer: Self-pay | Admitting: Oncology

## 2021-02-27 DIAGNOSIS — N189 Chronic kidney disease, unspecified: Secondary | ICD-10-CM

## 2021-02-27 DIAGNOSIS — N289 Disorder of kidney and ureter, unspecified: Secondary | ICD-10-CM | POA: Diagnosis not present

## 2021-02-27 DIAGNOSIS — D631 Anemia in chronic kidney disease: Secondary | ICD-10-CM

## 2021-02-27 DIAGNOSIS — D649 Anemia, unspecified: Secondary | ICD-10-CM

## 2021-02-27 HISTORY — DX: Anemia, unspecified: D64.9

## 2021-02-27 HISTORY — DX: Anemia in chronic kidney disease: D63.1

## 2021-02-27 LAB — BASIC METABOLIC PANEL
BUN: 16 (ref 4–21)
CO2: 23 — AB (ref 13–22)
Chloride: 97 — AB (ref 99–108)
Creatinine: 0.8 (ref 0.6–1.3)
Glucose: 91
Potassium: 4.9 (ref 3.4–5.3)
Sodium: 129 — AB (ref 137–147)

## 2021-02-27 LAB — CBC AND DIFFERENTIAL
HCT: 32 — AB (ref 41–53)
Hemoglobin: 10.6 — AB (ref 13.5–17.5)
Neutrophils Absolute: 3.41
Platelets: 294 (ref 150–399)
WBC: 5.5

## 2021-02-27 LAB — VITAMIN B12: Vitamin B-12: 1066 pg/mL — ABNORMAL HIGH (ref 180–914)

## 2021-02-27 LAB — CBC
MCV: 95 — AB (ref 80–94)
RBC: 3.31 — AB (ref 3.87–5.11)

## 2021-02-27 LAB — FERRITIN: Ferritin: 197 ng/mL (ref 24–336)

## 2021-02-27 LAB — IRON AND TIBC
Iron: 64 ug/dL (ref 45–182)
Saturation Ratios: 22 % (ref 17.9–39.5)
TIBC: 287 ug/dL (ref 250–450)
UIBC: 223 ug/dL

## 2021-02-27 LAB — HEPATIC FUNCTION PANEL
ALT: 19 (ref 10–40)
AST: 43 — AB (ref 14–40)
Alkaline Phosphatase: 72 (ref 25–125)
Bilirubin, Total: 0.7

## 2021-02-27 LAB — COMPREHENSIVE METABOLIC PANEL
Albumin: 4.3 (ref 3.5–5.0)
Calcium: 8.9 (ref 8.7–10.7)

## 2021-02-27 LAB — FOLATE: Folate: 41.1 ng/mL (ref >5.9)

## 2021-02-27 NOTE — Telephone Encounter (Signed)
Per 4/26 los next appt scheduled and given to patient 

## 2021-02-27 NOTE — Telephone Encounter (Signed)
Dr Bobby Rumpf reviewed labs, states, "all iron and B12 levels are normal. He needs to see his PCP or heart doctor to assess other reasons for fatigue".  I notified pt of above, & he verbalized understanding.

## 2021-03-05 ENCOUNTER — Other Ambulatory Visit: Payer: Self-pay | Admitting: Physician Assistant

## 2021-04-12 DIAGNOSIS — Z681 Body mass index (BMI) 19 or less, adult: Secondary | ICD-10-CM | POA: Diagnosis not present

## 2021-04-12 DIAGNOSIS — R5381 Other malaise: Secondary | ICD-10-CM | POA: Diagnosis not present

## 2021-04-12 DIAGNOSIS — R5383 Other fatigue: Secondary | ICD-10-CM | POA: Diagnosis not present

## 2021-04-12 DIAGNOSIS — R634 Abnormal weight loss: Secondary | ICD-10-CM | POA: Diagnosis not present

## 2021-04-23 DIAGNOSIS — M19042 Primary osteoarthritis, left hand: Secondary | ICD-10-CM | POA: Diagnosis not present

## 2021-04-23 DIAGNOSIS — M19041 Primary osteoarthritis, right hand: Secondary | ICD-10-CM | POA: Diagnosis not present

## 2021-04-24 DIAGNOSIS — J479 Bronchiectasis, uncomplicated: Secondary | ICD-10-CM | POA: Diagnosis not present

## 2021-04-24 DIAGNOSIS — K5750 Diverticulosis of both small and large intestine without perforation or abscess without bleeding: Secondary | ICD-10-CM | POA: Diagnosis not present

## 2021-04-24 DIAGNOSIS — I2510 Atherosclerotic heart disease of native coronary artery without angina pectoris: Secondary | ICD-10-CM | POA: Diagnosis not present

## 2021-04-24 DIAGNOSIS — Q278 Other specified congenital malformations of peripheral vascular system: Secondary | ICD-10-CM | POA: Diagnosis not present

## 2021-04-24 DIAGNOSIS — R634 Abnormal weight loss: Secondary | ICD-10-CM | POA: Diagnosis not present

## 2021-04-24 DIAGNOSIS — Z8546 Personal history of malignant neoplasm of prostate: Secondary | ICD-10-CM | POA: Diagnosis not present

## 2021-04-24 DIAGNOSIS — C61 Malignant neoplasm of prostate: Secondary | ICD-10-CM | POA: Diagnosis not present

## 2021-05-10 ENCOUNTER — Telehealth: Payer: Self-pay | Admitting: Oncology

## 2021-05-10 NOTE — Telephone Encounter (Signed)
Called pt per provider PAL on 7/26 - unable to reach pt. Left message with new apt date and time

## 2021-05-18 ENCOUNTER — Other Ambulatory Visit: Payer: Self-pay

## 2021-05-21 ENCOUNTER — Other Ambulatory Visit: Payer: Self-pay

## 2021-05-21 ENCOUNTER — Encounter: Payer: Self-pay | Admitting: Cardiology

## 2021-05-21 ENCOUNTER — Ambulatory Visit: Payer: Medicare HMO | Admitting: Cardiology

## 2021-05-21 VITALS — BP 136/72 | HR 84 | Ht 70.6 in | Wt 146.8 lb

## 2021-05-21 DIAGNOSIS — E785 Hyperlipidemia, unspecified: Secondary | ICD-10-CM | POA: Diagnosis not present

## 2021-05-21 DIAGNOSIS — I10 Essential (primary) hypertension: Secondary | ICD-10-CM | POA: Diagnosis not present

## 2021-05-21 DIAGNOSIS — I25119 Atherosclerotic heart disease of native coronary artery with unspecified angina pectoris: Secondary | ICD-10-CM | POA: Diagnosis not present

## 2021-05-21 NOTE — Progress Notes (Signed)
Cardiology Office Note:    Date:  05/21/2021   ID:  Brian Dunn, DOB [birth year], MRN [id number]  PCP:  Angelina Sheriff, MD  Cardiologist:  Jenean Lindau, MD   Referring MD: Angelina Sheriff, MD    ASSESSMENT:    1. Coronary artery disease involving native coronary artery of native heart with angina pectoris ([place])   2. Hyperlipidemia with target LDL less than 70   3. Essential hypertension    PLAN:    In order of problems listed above:  Coronary artery disease discussed my findings with the patient at length.  I do not think his weight loss is from cardiac involvement.  His ejection fraction was fine he has no symptoms of congestive heart failure he is asymptomatic as far as chest pain is concerned.  I reassured him about this and he understands. Essential hypertension: Blood pressure stable and diet was emphasized.  Lifestyle modification urged. Mixed dyslipidemia: Lipids were reviewed I discussed this with him at length and will continue current medications. Patient will be seen in follow-up appointment in 6 months or earlier if the patient has any concerns    Medication Adjustments/Labs and Tests Ordered: Current medicines are reviewed at length with the patient today.  Concerns regarding medicines are outlined above.  No orders of the defined types were placed in this encounter.  No orders of the defined types were placed in this encounter.    No chief complaint on file.    History of Present Illness:    Brian Dunn is a 80 y.o. male.  Patient has past medical history of coronary artery disease, essential hypertension and dyslipidemia.  He was evaluated by me for symptoms of angina pectoris and did well.  No chest pain orthopnea or PND.  His current issue has been the fact that he is losing weight.  He does not do much because he feels tired.  He has had chest pain which was significant for angina before his stenting currently is chest pain-free.  At  the time of my evaluation, the patient is alert awake oriented and in no distress.  Past Medical History:  Diagnosis Date   Acute bronchitis 05/29/2012   ALLERGIC RHINITIS 01/22/2007   Qualifier: Diagnosis of  By: Larose Kells MD, [place]    Allergic rhinitis    Anemia 02/27/2021   Angina pectoris ([place]) 11/07/2020   Angina, class III ([place]) 11/07/2020   Asthma 06/18/2012   Followed in Pulmonary clinic/ [facility]/ [facility]  - 01/09/2018  After extensive coaching inhaler device  effectiveness =   75% > continue symb 160 2bid and return for pfts in 3 months ? RA bronchiolitis?  - PFT's  04/23/2018  FEV1 2.84 (91 % ) ratio 73  p no % improvement from saba p symb 160 prior to study with DLCO  76 % corrects to 79  % for alv volume   - 04/23/2018  After extensive coaching   Bilateral inguinal hernia without obstruction or gangrene 06/20/2016   CAD (coronary artery disease), native coronary artery 06/20/2016   Chest pain, unspecified    COPD (chronic obstructive pulmonary disease) ([place])    Coronary artery disease involving native coronary artery of native heart with angina pectoris ([place]) 06/20/2016   Cough 03/25/2012   Followed in Pulmonary clinic/ [facility]/ [facility]    - Trial off acei again  03/25/2012    - Sinus CT 01/24/11 There is mucosal thickening within the paranasal sinuses without  air-fluid levels. Neither ostiomeatal unit is patent.    Cough variant asthma 10/18/2010   Followed in Pulmonary clinic/ [facility]/ [facility]  -HFA 75% p coaching 01/22/2011  -PFT's  02/04/2011 minimal airflow obst, nl dlco    Disorder of bone and cartilage 01/22/2007   Qualifier: Diagnosis of  By: Larose Kells MD, [place]   Overview:  Overview:  Qualifier: Diagnosis of  By: Larose Kells MD, [place] and other specified forms of tremor    Essential hypertension 11/06/2017   GERD 01/22/2007   Annotation: history of esophageal stricture Qualifier: Diagnosis of  By: Larose Kells MD, [place].    GERD (gastroesophageal reflux disease)     HIP FRACTURE 01/22/2007   Annotation: right-sided status post surgery Qualifier: Diagnosis of  By: Larose Kells MD, [place]    Hip fracture ([place])    Hyperlipidemia 06/20/2016   Hyperlipidemia with target LDL less than 70 06/20/2016   Hypertension    HYPOGONADISM 02/18/2007   Qualifier: Diagnosis of  By: Larose Kells MD, [place]    Hypogonadism male    Low back pain 09/09/2013   Multiple lung nodules on CT 11/21/2015   CT [place] 10/17/15 mpns > 15 y since quit smoking > rec 12 m f/u as this is low risk > done 06/04/16 no change rec recheck in 27m  - CT [place] 12/08/17 no change nodules > meets benign criteria > no directed f/u - Quantiferon GOLD TB 01/09/18 neg    Osteopenia    Personal history of malignant neoplasm of prostate    Postoperative visit 08/14/2016   Tremor, essential 02/18/2007   Overview:  Overview:  Qualifier: Diagnosis of  By: Larose Kells MD, [place]  Last Assessment & Plan:  Reviewed tremor may increase on dulera so will need to be balanced against benefits to cough    Past Surgical History:  Procedure Laterality Date   CORONARY ANGIOPLASTY WITH STENT PLACEMENT  2008   CORONARY STENT INTERVENTION N/A 11/16/2020   Procedure: CORONARY STENT INTERVENTION;  Surgeon: Leonie Man, MD;  Location: [facility];  Service: Cardiovascular;  Laterality: N/A;   INSERTION PROSTATE RADIATION SEED     LEFT HEART CATH AND CORONARY ANGIOGRAPHY N/A 11/16/2020   Procedure: LEFT HEART CATH AND CORONARY ANGIOGRAPHY;  Surgeon: Leonie Man, MD;  Location: [facility];  Service: Cardiovascular;  Laterality: N/A;    Current Medications: Current Meds  Medication Sig   albuterol (PROAIR HFA) 108 (90 BASE) MCG/ACT inhaler Inhale 2 puffs into the lungs every 6 (six) hours as needed for wheezing.   allopurinol (ZYLOPRIM) 100 MG tablet Take 1 tablet by mouth daily.    Ascorbic Acid (VITAMIN C) 1000 MG tablet Take 500 mg by mouth daily.   aspirin EC 81 MG tablet Take 1 tablet (81 mg total) by mouth  daily.   budesonide-formoterol (SYMBICORT) 160-4.5 MCG/ACT inhaler Inhale 2 puffs into the lungs daily.   celecoxib (CELEBREX) 200 MG capsule Take 200 mg by mouth daily.    clopidogrel (PLAVIX) 75 MG tablet TAKE 1 TABLET BY MOUTH DAILY WITH BREAKFAST.   Coenzyme Q10 (CO Q-10) 75 MG CAPS Take 120 mg by mouth daily.   famotidine (PEPCID) 40 MG tablet Take 40 mg by mouth daily.   fluticasone (FLONASE) 50 MCG/ACT nasal spray Place 2 sprays into the nose daily.   irbesartan (AVAPRO) 300 MG tablet Take 300 mg by mouth daily.    isosorbide mononitrate (IMDUR) 30 MG 24 hr tablet Take 0.5  tablets (15 mg total) by mouth daily.   loratadine (CLARITIN) 10 MG tablet Take 10 mg by mouth daily.    Magnesium 400 MG CAPS Take 400 mg by mouth 2 (two) times daily.   Multiple Vitamin (MULTIVITAMIN) capsule Take 1 capsule by mouth daily.   nitroGLYCERIN (NITROSTAT) 0.4 MG SL tablet Place 1 tablet (0.4 mg total) under the tongue every 5 (five) minutes as needed for chest pain.   pravastatin (PRAVACHOL) 40 MG tablet Take 1 tablet (40 mg total) by mouth daily.   primidone (MYSOLINE) 50 MG tablet Take 50 mg by mouth at bedtime.    traMADol (ULTRAM) 50 MG tablet Take 50 mg by mouth 2 (two) times daily as needed for moderate pain.   vitamin B-12 (CYANOCOBALAMIN) 500 MCG tablet Take 500 mcg by mouth daily.     Allergies:   Metoprolol, Morphine, Penicillin g, Penicillins, and Propranolol   Social History   Socioeconomic History   Marital status: Married    Spouse name: Not on file   Number of children: Not on file   Years of education: Not on file   Highest education level: Not on file  Occupational History   Not on file  Tobacco Use   Smoking status: Former    Packs/day: 1.00    Years: 35.00    Pack years: 35.00    Types: Cigarettes    Quit date: 11/04/1996    Years since quitting: 24.5   Smokeless tobacco: Never  Substance and Sexual Activity   Alcohol use: Not on file    Comment: ETOH daily   Drug  use: Yes   Sexual activity: Not on file  Other Topics Concern   Not on file  Social History Narrative   Married and has children.  Works in [employer].   Social Determinants of Health   Financial Resource Strain: Not on file  Food Insecurity: Not on file  Transportation Needs: Not on file  Physical Activity: Not on file  Stress: Not on file  Social Connections: Not on file     Family History: The patient's family history includes Asthma in his father and sister; Heart disease in his mother and sister; Parkinsonism in his father; Throat cancer in his sister.  ROS:   Please see the history of present illness.    All other systems reviewed and are negative.  EKGs/Labs/Other Studies Reviewed:    The following studies were reviewed today: CORONARY STENT INTERVENTION  LEFT HEART CATH AND CORONARY ANGIOGRAPHY    Conclusion    Prox Cx lesion is 50% stenosed. Mid Cx lesion is 99% stenosed. Post intervention, there is a 0% residual stenosis. A drug-eluting stent was successfully placed using a STENT RESOLUTE ONYX 3.0X34. Post intervention, there is a 0% residual stenosis. Prox LAD to Mid LAD lesion is 40% stenosed with 60% stenosed side branch in 2nd Diag. Mid LAD lesion is 60% stenosed. Prox RCA lesion is 25% stenosed. There is hyperdynamic left ventricular systolic function. LV end diastolic pressure is normal. The left ventricular ejection fraction is greater than 65% by visual estimate. There is no aortic valve stenosis.     Severe single-vessel disease with 99% focal mid LCx lesion just downstream to previously stent along with ~50% ISR in the previously placed stent (CULPRIT LESION-PCI); Successful DES PCI of mid LCx 99% lesion and overlapping the previously placed stent with 50+ percent ISR. Moderate mid LAD-2nd Diag disease best treated medically Hyperdynamic LV with normal EF.  Normal EDP  RECOMMENDATIONS With difficult femoral access after aborted radial access,  we will keep the patient overnight for monitoring.  He did have a mild hematoma prior to his sheath exchanged from 6 [place] 7 [place] in the groin.  He was still having mild chest pain post PCI from high-pressure post dilation. Continue aggressive risk modification-would consider possibly converting from pravastatin to more high potency statin.  He has intolerance to beta-blocker listed which may not preclude use of carvedilol.   Anticipate discharge tomorrow and to follow-up with Dr. Geraldo Pitter   Recent Labs: 02/27/2021: ALT 19; BUN 16; Creatinine 0.8; Hemoglobin 10.6; Platelets 294; Potassium 4.9; Sodium 129  Recent Lipid Panel    Component Value Date/Time   CHOL 144 01/24/2021 0843   TRIG 49 01/24/2021 0843   HDL 79 01/24/2021 0843   CHOLHDL 1.8 01/24/2021 0843   CHOLHDL 2.5 CALC 01/22/2007 0923   VLDL 19 01/22/2007 0923   LDLCALC 54 01/24/2021 0843   LDLDIRECT 117.9 01/22/2007 0923    Physical Exam:    VS:  BP 136/72   Pulse 84   Ht 5' 10.6" (1.793 m)   Wt 146 lb 12.8 oz (66.6 kg)   SpO2 94%   BMI 20.71 kg/m     Wt Readings from Last 3 Encounters:  05/21/21 146 lb 12.8 oz (66.6 kg)  02/27/21 149 lb (67.6 kg)  01/30/21 154 lb (69.9 kg)     GEN: Patient is in no acute distress HEENT: Normal NECK: No JVD; No carotid bruits LYMPHATICS: No lymphadenopathy CARDIAC: Hear sounds regular, 2/6 systolic murmur at the apex. RESPIRATORY:  Clear to auscultation without rales, wheezing or rhonchi  ABDOMEN: Soft, non-tender, non-distended MUSCULOSKELETAL:  No edema; No deformity  SKIN: Warm and dry NEUROLOGIC:  Alert and oriented x 3 PSYCHIATRIC:  Normal affect   Signed, Jenean Lindau, MD  05/21/2021 3:01 PM    [facility]

## 2021-05-21 NOTE — Patient Instructions (Signed)

## 2021-05-29 ENCOUNTER — Other Ambulatory Visit: Payer: Medicare HMO

## 2021-05-29 ENCOUNTER — Ambulatory Visit: Payer: Medicare HMO | Admitting: Oncology

## 2021-05-31 DIAGNOSIS — H353131 Nonexudative age-related macular degeneration, bilateral, early dry stage: Secondary | ICD-10-CM | POA: Diagnosis not present

## 2021-05-31 DIAGNOSIS — H25813 Combined forms of age-related cataract, bilateral: Secondary | ICD-10-CM | POA: Diagnosis not present

## 2021-05-31 DIAGNOSIS — H43392 Other vitreous opacities, left eye: Secondary | ICD-10-CM | POA: Diagnosis not present

## 2021-05-31 DIAGNOSIS — H5203 Hypermetropia, bilateral: Secondary | ICD-10-CM | POA: Diagnosis not present

## 2021-06-07 NOTE — Progress Notes (Signed)
[facility]  [address] [place],  [place]  [address] [phone]  Clinic Day:  06/19/2021  Referring physician: Angelina Sheriff, MD  This document serves as a record of services personally performed by Hosie Poisson, MD. It was created on their behalf by [facility] E, a trained medical scribe. The creation of this record is based on the scribe's personal observations and the provider's statements to them.  HISTORY OF PRESENT ILLNESS:  The patient is a 80 y.o. male with anemia secondary to mild renal insufficiency.  Despite having anemia, his hemoglobin has consistently been above 10 to where it has been followed without needing any particular intervention.  He comes in today for routine follow up.  Since his last visit, the patient has been doing okay.  He has a modest degree of fatigue, but denies having any overt forms of blood loss.    PHYSICAL EXAM:  Blood pressure (!) 175/81, pulse 69, temperature 98.2 F (36.8 C), resp. rate 18, height 5' 10.6" (1.793 m), weight 140 lb 4.8 oz (63.6 kg), SpO2 99 %. Wt Readings from Last 3 Encounters:  06/19/21 140 lb 4.8 oz (63.6 kg)  05/21/21 146 lb 12.8 oz (66.6 kg)  02/27/21 149 lb (67.6 kg)   Body mass index is 19.79 kg/m. Performance status (ECOG): 1 Physical Exam Constitutional:      Appearance: Normal appearance. He is not ill-appearing.  HENT:     Mouth/Throat:     Mouth: Mucous membranes are moist.     Pharynx: Oropharynx is clear. No oropharyngeal exudate or posterior oropharyngeal erythema.  Cardiovascular:     Rate and Rhythm: Normal rate and regular rhythm.     Heart sounds: No murmur heard.   No friction rub. No gallop.  Pulmonary:     Effort: Pulmonary effort is normal. No respiratory distress.     Breath sounds: Normal breath sounds. No wheezing, rhonchi or rales.  Abdominal:     General: Bowel sounds are normal. There is no distension.     Palpations: Abdomen is soft. There  is no mass.     Tenderness: There is no abdominal tenderness.  Musculoskeletal:        General: No swelling.     Right lower leg: No edema.     Left lower leg: No edema.  Lymphadenopathy:     Cervical: No cervical adenopathy.     Upper Body:     Right upper body: No supraclavicular or axillary adenopathy.     Left upper body: No supraclavicular or axillary adenopathy.     Lower Body: No right inguinal adenopathy. No left inguinal adenopathy.  Skin:    General: Skin is warm.     Coloration: Skin is not jaundiced.     Findings: No lesion or rash.  Neurological:     General: No focal deficit present.     Mental Status: He is alert and oriented to person, place, and time. Mental status is at baseline.     Cranial Nerves: Cranial nerves are intact.  Psychiatric:        Mood and Affect: Mood normal.        Behavior: Behavior normal.        Thought Content: Thought content normal.   LABS:      Ref. Range 06/19/2021 09:31  Iron Latest Ref Range: 45 - 182 ug/dL 72  UIBC Latest Units: ug/dL 214  TIBC Latest Ref Range: 250 - 450 ug/dL 286  Saturation Ratios Latest Ref Range: 17.9 - 39.5 % 25  Ferritin Latest Ref Range: 24 - 336 ng/mL 226    ASSESSMENT & PLAN:  Assessment/Plan:  A 80 y.o. male with anemia secondary to mild renal insufficiency.  His hemoglobin of 10.6 is unchanged versus what it was 6 months ago.  I am pleased as his kidney parameters today are also normal.  As his hemoglobin remains above 10, he will continue to be followed conservatively.  I will see this patient back in 6 months for repeat clinical assessment.  The patient understands all the plans discussed today and is in agreement with them.     I, Rita Ohara, am acting as scribe for Marice Potter, MD    I have reviewed this report as typed by the medical scribe, and it is complete and accurate.  Theta Leaf Macarthur Critchley, MD

## 2021-06-19 ENCOUNTER — Encounter: Payer: Self-pay | Admitting: Oncology

## 2021-06-19 ENCOUNTER — Other Ambulatory Visit: Payer: Self-pay | Admitting: Oncology

## 2021-06-19 ENCOUNTER — Telehealth: Payer: Self-pay | Admitting: Oncology

## 2021-06-19 ENCOUNTER — Inpatient Hospital Stay: Payer: Medicare HMO | Attending: Oncology

## 2021-06-19 ENCOUNTER — Other Ambulatory Visit: Payer: Self-pay

## 2021-06-19 ENCOUNTER — Inpatient Hospital Stay (INDEPENDENT_AMBULATORY_CARE_PROVIDER_SITE_OTHER): Payer: Medicare HMO | Admitting: Oncology

## 2021-06-19 VITALS — BP 175/81 | HR 69 | Temp 98.2°F | Resp 18 | Ht 70.6 in | Wt 140.3 lb

## 2021-06-19 DIAGNOSIS — D631 Anemia in chronic kidney disease: Secondary | ICD-10-CM

## 2021-06-19 DIAGNOSIS — N189 Chronic kidney disease, unspecified: Secondary | ICD-10-CM

## 2021-06-19 DIAGNOSIS — D649 Anemia, unspecified: Secondary | ICD-10-CM | POA: Diagnosis not present

## 2021-06-19 DIAGNOSIS — N289 Disorder of kidney and ureter, unspecified: Secondary | ICD-10-CM | POA: Diagnosis not present

## 2021-06-19 LAB — CBC: RBC: 3.3 — AB (ref 3.87–5.11)

## 2021-06-19 LAB — BASIC METABOLIC PANEL
BUN: 16 (ref 4–21)
CO2: 23 — AB (ref 13–22)
Chloride: 94 — AB (ref 99–108)
Creatinine: 1 (ref 0.6–1.3)
Glucose: 87
Potassium: 4.7 (ref 3.4–5.3)
Sodium: 129 — AB (ref 137–147)

## 2021-06-19 LAB — CBC AND DIFFERENTIAL
HCT: 31 — AB (ref 41–53)
Hemoglobin: 10.6 — AB (ref 13.5–17.5)
Neutrophils Absolute: 2.78
Platelets: 256 (ref 150–399)
WBC: 4.8

## 2021-06-19 LAB — IRON AND TIBC
Iron: 72 ug/dL (ref 45–182)
Saturation Ratios: 25 % (ref 17.9–39.5)
TIBC: 286 ug/dL (ref 250–450)
UIBC: 214 ug/dL

## 2021-06-19 LAB — HEPATIC FUNCTION PANEL
ALT: 17 (ref 10–40)
AST: 34 (ref 14–40)
Alkaline Phosphatase: 59 (ref 25–125)
Bilirubin, Total: 0.7

## 2021-06-19 LAB — FERRITIN: Ferritin: 226 ng/mL (ref 24–336)

## 2021-06-19 LAB — COMPREHENSIVE METABOLIC PANEL
Albumin: 4.4 (ref 3.5–5.0)
Calcium: 9.5 (ref 8.7–10.7)

## 2021-06-19 NOTE — Telephone Encounter (Signed)
Per 8/16 LOS, patient scheduled for Feb Appt's

## 2021-06-28 DIAGNOSIS — M4186 Other forms of scoliosis, lumbar region: Secondary | ICD-10-CM | POA: Diagnosis not present

## 2021-06-30 DIAGNOSIS — R0789 Other chest pain: Secondary | ICD-10-CM | POA: Diagnosis not present

## 2021-06-30 DIAGNOSIS — R1310 Dysphagia, unspecified: Secondary | ICD-10-CM | POA: Diagnosis not present

## 2021-06-30 DIAGNOSIS — E860 Dehydration: Secondary | ICD-10-CM | POA: Diagnosis not present

## 2021-06-30 DIAGNOSIS — R531 Weakness: Secondary | ICD-10-CM | POA: Diagnosis not present

## 2021-06-30 DIAGNOSIS — E871 Hypo-osmolality and hyponatremia: Secondary | ICD-10-CM | POA: Diagnosis not present

## 2021-06-30 DIAGNOSIS — J159 Unspecified bacterial pneumonia: Secondary | ICD-10-CM | POA: Diagnosis not present

## 2021-06-30 DIAGNOSIS — Z743 Need for continuous supervision: Secondary | ICD-10-CM | POA: Diagnosis not present

## 2021-06-30 DIAGNOSIS — J690 Pneumonitis due to inhalation of food and vomit: Secondary | ICD-10-CM | POA: Diagnosis not present

## 2021-06-30 DIAGNOSIS — I700 Atherosclerosis of aorta: Secondary | ICD-10-CM | POA: Diagnosis not present

## 2021-06-30 DIAGNOSIS — I499 Cardiac arrhythmia, unspecified: Secondary | ICD-10-CM | POA: Diagnosis not present

## 2021-06-30 DIAGNOSIS — R0902 Hypoxemia: Secondary | ICD-10-CM | POA: Diagnosis not present

## 2021-06-30 DIAGNOSIS — M4854XA Collapsed vertebra, not elsewhere classified, thoracic region, initial encounter for fracture: Secondary | ICD-10-CM | POA: Diagnosis not present

## 2021-06-30 DIAGNOSIS — G8929 Other chronic pain: Secondary | ICD-10-CM | POA: Diagnosis not present

## 2021-06-30 DIAGNOSIS — J181 Lobar pneumonia, unspecified organism: Secondary | ICD-10-CM | POA: Diagnosis not present

## 2021-06-30 DIAGNOSIS — Z8546 Personal history of malignant neoplasm of prostate: Secondary | ICD-10-CM | POA: Diagnosis not present

## 2021-06-30 DIAGNOSIS — M545 Low back pain, unspecified: Secondary | ICD-10-CM | POA: Diagnosis not present

## 2021-06-30 DIAGNOSIS — K5730 Diverticulosis of large intestine without perforation or abscess without bleeding: Secondary | ICD-10-CM | POA: Diagnosis not present

## 2021-06-30 DIAGNOSIS — I2510 Atherosclerotic heart disease of native coronary artery without angina pectoris: Secondary | ICD-10-CM | POA: Diagnosis not present

## 2021-06-30 DIAGNOSIS — R079 Chest pain, unspecified: Secondary | ICD-10-CM | POA: Diagnosis not present

## 2021-07-01 DIAGNOSIS — J690 Pneumonitis due to inhalation of food and vomit: Secondary | ICD-10-CM | POA: Diagnosis not present

## 2021-07-01 DIAGNOSIS — J159 Unspecified bacterial pneumonia: Secondary | ICD-10-CM | POA: Diagnosis not present

## 2021-07-01 DIAGNOSIS — E871 Hypo-osmolality and hyponatremia: Secondary | ICD-10-CM | POA: Diagnosis not present

## 2021-07-02 DIAGNOSIS — E871 Hypo-osmolality and hyponatremia: Secondary | ICD-10-CM | POA: Diagnosis not present

## 2021-07-02 DIAGNOSIS — J159 Unspecified bacterial pneumonia: Secondary | ICD-10-CM | POA: Diagnosis not present

## 2021-07-02 DIAGNOSIS — J690 Pneumonitis due to inhalation of food and vomit: Secondary | ICD-10-CM | POA: Diagnosis not present

## 2021-07-03 DIAGNOSIS — J690 Pneumonitis due to inhalation of food and vomit: Secondary | ICD-10-CM | POA: Diagnosis not present

## 2021-07-03 DIAGNOSIS — J159 Unspecified bacterial pneumonia: Secondary | ICD-10-CM | POA: Diagnosis not present

## 2021-07-03 DIAGNOSIS — E871 Hypo-osmolality and hyponatremia: Secondary | ICD-10-CM | POA: Diagnosis not present

## 2021-07-05 ENCOUNTER — Telehealth: Payer: Self-pay | Admitting: Cardiology

## 2021-07-05 DIAGNOSIS — J690 Pneumonitis due to inhalation of food and vomit: Secondary | ICD-10-CM | POA: Diagnosis not present

## 2021-07-05 DIAGNOSIS — M1990 Unspecified osteoarthritis, unspecified site: Secondary | ICD-10-CM | POA: Diagnosis not present

## 2021-07-05 DIAGNOSIS — D509 Iron deficiency anemia, unspecified: Secondary | ICD-10-CM | POA: Diagnosis not present

## 2021-07-05 DIAGNOSIS — N419 Inflammatory disease of prostate, unspecified: Secondary | ICD-10-CM | POA: Diagnosis not present

## 2021-07-05 DIAGNOSIS — Z681 Body mass index (BMI) 19 or less, adult: Secondary | ICD-10-CM | POA: Diagnosis not present

## 2021-07-05 MED ORDER — PRAVASTATIN SODIUM 40 MG PO TABS
40.0000 mg | ORAL_TABLET | Freq: Every day | ORAL | 3 refills | Status: DC
Start: 2021-07-05 — End: 2022-09-30

## 2021-07-05 NOTE — Telephone Encounter (Signed)
*  STAT* If patient is at the pharmacy, call can be transferred to refill team.   1. Which medications need to be refilled? (please list name of each medication and dose if known) pravastatin  2. Which pharmacy/location (including street and city if local pharmacy) is medication to be sent to? [facility]  3. Do they need a 30 day or 90 day supply? 90      Patient is out

## 2021-07-10 DIAGNOSIS — R1312 Dysphagia, oropharyngeal phase: Secondary | ICD-10-CM | POA: Diagnosis not present

## 2021-07-11 DIAGNOSIS — D649 Anemia, unspecified: Secondary | ICD-10-CM | POA: Diagnosis not present

## 2021-07-11 DIAGNOSIS — K219 Gastro-esophageal reflux disease without esophagitis: Secondary | ICD-10-CM | POA: Diagnosis not present

## 2021-07-11 DIAGNOSIS — K649 Unspecified hemorrhoids: Secondary | ICD-10-CM | POA: Diagnosis not present

## 2021-07-11 DIAGNOSIS — K5900 Constipation, unspecified: Secondary | ICD-10-CM | POA: Diagnosis not present

## 2021-07-11 DIAGNOSIS — J690 Pneumonitis due to inhalation of food and vomit: Secondary | ICD-10-CM | POA: Diagnosis not present

## 2021-07-11 DIAGNOSIS — K5790 Diverticulosis of intestine, part unspecified, without perforation or abscess without bleeding: Secondary | ICD-10-CM | POA: Diagnosis not present

## 2021-07-11 DIAGNOSIS — R634 Abnormal weight loss: Secondary | ICD-10-CM | POA: Diagnosis not present

## 2021-07-11 DIAGNOSIS — R1310 Dysphagia, unspecified: Secondary | ICD-10-CM | POA: Diagnosis not present

## 2021-08-01 DIAGNOSIS — R634 Abnormal weight loss: Secondary | ICD-10-CM | POA: Diagnosis not present

## 2021-08-01 DIAGNOSIS — K5790 Diverticulosis of intestine, part unspecified, without perforation or abscess without bleeding: Secondary | ICD-10-CM | POA: Diagnosis not present

## 2021-08-01 DIAGNOSIS — K5900 Constipation, unspecified: Secondary | ICD-10-CM | POA: Diagnosis not present

## 2021-08-01 DIAGNOSIS — R1310 Dysphagia, unspecified: Secondary | ICD-10-CM | POA: Diagnosis not present

## 2021-08-01 DIAGNOSIS — R1312 Dysphagia, oropharyngeal phase: Secondary | ICD-10-CM | POA: Diagnosis not present

## 2021-08-01 DIAGNOSIS — K219 Gastro-esophageal reflux disease without esophagitis: Secondary | ICD-10-CM | POA: Diagnosis not present

## 2021-08-01 DIAGNOSIS — D649 Anemia, unspecified: Secondary | ICD-10-CM | POA: Diagnosis not present

## 2021-08-09 DIAGNOSIS — Z23 Encounter for immunization: Secondary | ICD-10-CM | POA: Diagnosis not present

## 2021-08-15 DIAGNOSIS — Z23 Encounter for immunization: Secondary | ICD-10-CM | POA: Diagnosis not present

## 2021-09-11 DIAGNOSIS — D649 Anemia, unspecified: Secondary | ICD-10-CM | POA: Diagnosis not present

## 2021-09-12 DIAGNOSIS — J209 Acute bronchitis, unspecified: Secondary | ICD-10-CM | POA: Diagnosis not present

## 2021-09-12 DIAGNOSIS — J0100 Acute maxillary sinusitis, unspecified: Secondary | ICD-10-CM | POA: Diagnosis not present

## 2021-09-12 DIAGNOSIS — R062 Wheezing: Secondary | ICD-10-CM | POA: Diagnosis not present

## 2021-09-13 DIAGNOSIS — K219 Gastro-esophageal reflux disease without esophagitis: Secondary | ICD-10-CM | POA: Diagnosis not present

## 2021-09-13 DIAGNOSIS — R1312 Dysphagia, oropharyngeal phase: Secondary | ICD-10-CM | POA: Diagnosis not present

## 2021-09-13 DIAGNOSIS — R634 Abnormal weight loss: Secondary | ICD-10-CM | POA: Diagnosis not present

## 2021-09-13 DIAGNOSIS — K649 Unspecified hemorrhoids: Secondary | ICD-10-CM | POA: Diagnosis not present

## 2021-09-13 DIAGNOSIS — K5790 Diverticulosis of intestine, part unspecified, without perforation or abscess without bleeding: Secondary | ICD-10-CM | POA: Diagnosis not present

## 2021-09-13 DIAGNOSIS — K5900 Constipation, unspecified: Secondary | ICD-10-CM | POA: Diagnosis not present

## 2021-09-18 ENCOUNTER — Telehealth: Payer: Self-pay

## 2021-09-18 NOTE — Telephone Encounter (Signed)
   [facility] Pre-operative Risk Assessment    Patient Name: Brian Dunn  DOB: [birth year] MRN: [id number]  HEARTCARE STAFF:  - IMPORTANT!!!!!! Under Visit Info/Reason for Call, type in Other and utilize the format Clearance MM/DD/YY or Clearance TBD. Do not use dashes or single digits. - Please review there is not already an duplicate clearance open for this procedure. - If request is for dental extraction, please clarify the # of teeth to be extracted. - If the patient is currently at the dentist's office, call Pre-Op Callback Staff (MA/nurse) to input urgent request.  - If the patient is not currently in the dentist office, please route to the Pre-Op pool.  Request for surgical clearance:  What type of surgery is being performed? EGD  When is this surgery scheduled? 10/04/21  What type of clearance is required (medical clearance vs. Pharmacy clearance to hold med vs. Both)? Both  Are there any medications that need to be held prior to surgery and how long? Aspirin 09-29-21 and Plavix 09-29-21  Practice name and name of physician performing surgery? [facility]- Dr. Christia Reading Misenheimer  What is the office phone number? [phone]   7.   What is the office fax number? [phone]  8.   Anesthesia type (None, local, MAC, general) ?    Lowella Grip 09/18/2021, 2:06 PM  _________________________________________________________________   (provider comments below)

## 2021-09-18 NOTE — Telephone Encounter (Signed)
Pt is s/p PCI with DES to the LCx 11/16/20. As he will be < 12 mos out from his PCI, will ask Dr. Geraldo Pitter to make recommendations regarding holding both ASA and Plavix. Richardson Dopp, PA-C    09/18/2021 3:30 PM

## 2021-09-19 NOTE — Telephone Encounter (Signed)
   Primary Cardiologist: Jenean Lindau, MD  Chart reviewed as part of pre-operative protocol coverage. Given past medical history and time since last visit, based on ACC/AHA guidelines, Brian Dunn would be at acceptable risk for the planned procedure without further cardiovascular testing.   His Plavix may be held for 5-7 days prior to his procedure.  Please resume Plavix as soon as hemostasis is achieved.  He will need to continue his aspirin throughout his procedure without interruption.  I will route this recommendation to the requesting party via Epic fax function and remove from pre-op pool.  Please call with questions.  [name]. [name] NP-C    09/19/2021, 11:01 AM [facility] [address] Office [phone] Fax [phone]

## 2021-10-02 DIAGNOSIS — J302 Other seasonal allergic rhinitis: Secondary | ICD-10-CM | POA: Diagnosis not present

## 2021-10-02 DIAGNOSIS — H6120 Impacted cerumen, unspecified ear: Secondary | ICD-10-CM | POA: Diagnosis not present

## 2021-10-02 DIAGNOSIS — Z681 Body mass index (BMI) 19 or less, adult: Secondary | ICD-10-CM | POA: Diagnosis not present

## 2021-10-04 DIAGNOSIS — K3189 Other diseases of stomach and duodenum: Secondary | ICD-10-CM | POA: Diagnosis not present

## 2021-10-04 DIAGNOSIS — R1310 Dysphagia, unspecified: Secondary | ICD-10-CM | POA: Diagnosis not present

## 2021-10-04 DIAGNOSIS — D649 Anemia, unspecified: Secondary | ICD-10-CM | POA: Diagnosis not present

## 2021-10-04 DIAGNOSIS — I10 Essential (primary) hypertension: Secondary | ICD-10-CM | POA: Diagnosis not present

## 2021-10-04 DIAGNOSIS — D500 Iron deficiency anemia secondary to blood loss (chronic): Secondary | ICD-10-CM | POA: Diagnosis not present

## 2021-10-04 DIAGNOSIS — I2510 Atherosclerotic heart disease of native coronary artery without angina pectoris: Secondary | ICD-10-CM | POA: Diagnosis not present

## 2021-10-04 DIAGNOSIS — K219 Gastro-esophageal reflux disease without esophagitis: Secondary | ICD-10-CM | POA: Diagnosis not present

## 2021-10-04 DIAGNOSIS — K222 Esophageal obstruction: Secondary | ICD-10-CM | POA: Diagnosis not present

## 2021-10-04 DIAGNOSIS — R634 Abnormal weight loss: Secondary | ICD-10-CM | POA: Diagnosis not present

## 2021-10-04 DIAGNOSIS — Z7902 Long term (current) use of antithrombotics/antiplatelets: Secondary | ICD-10-CM | POA: Diagnosis not present

## 2021-10-04 DIAGNOSIS — K449 Diaphragmatic hernia without obstruction or gangrene: Secondary | ICD-10-CM | POA: Diagnosis not present

## 2021-11-21 DIAGNOSIS — D649 Anemia, unspecified: Secondary | ICD-10-CM | POA: Diagnosis not present

## 2021-11-21 DIAGNOSIS — D509 Iron deficiency anemia, unspecified: Secondary | ICD-10-CM | POA: Diagnosis not present

## 2021-11-26 DIAGNOSIS — K219 Gastro-esophageal reflux disease without esophagitis: Secondary | ICD-10-CM | POA: Diagnosis not present

## 2021-11-26 DIAGNOSIS — R634 Abnormal weight loss: Secondary | ICD-10-CM | POA: Diagnosis not present

## 2021-11-26 DIAGNOSIS — R1312 Dysphagia, oropharyngeal phase: Secondary | ICD-10-CM | POA: Diagnosis not present

## 2021-11-26 DIAGNOSIS — K5790 Diverticulosis of intestine, part unspecified, without perforation or abscess without bleeding: Secondary | ICD-10-CM | POA: Diagnosis not present

## 2021-11-26 DIAGNOSIS — K5900 Constipation, unspecified: Secondary | ICD-10-CM | POA: Diagnosis not present

## 2021-11-26 DIAGNOSIS — D649 Anemia, unspecified: Secondary | ICD-10-CM | POA: Diagnosis not present

## 2021-11-26 DIAGNOSIS — K649 Unspecified hemorrhoids: Secondary | ICD-10-CM | POA: Diagnosis not present

## 2021-11-27 ENCOUNTER — Other Ambulatory Visit: Payer: Self-pay | Admitting: Physician Assistant

## 2021-12-05 DIAGNOSIS — M109 Gout, unspecified: Secondary | ICD-10-CM | POA: Diagnosis not present

## 2021-12-05 DIAGNOSIS — G250 Essential tremor: Secondary | ICD-10-CM | POA: Diagnosis not present

## 2021-12-05 DIAGNOSIS — K219 Gastro-esophageal reflux disease without esophagitis: Secondary | ICD-10-CM | POA: Diagnosis not present

## 2021-12-05 DIAGNOSIS — I10 Essential (primary) hypertension: Secondary | ICD-10-CM | POA: Diagnosis not present

## 2021-12-05 DIAGNOSIS — F17210 Nicotine dependence, cigarettes, uncomplicated: Secondary | ICD-10-CM | POA: Diagnosis not present

## 2021-12-05 DIAGNOSIS — I509 Heart failure, unspecified: Secondary | ICD-10-CM | POA: Diagnosis not present

## 2021-12-05 DIAGNOSIS — E785 Hyperlipidemia, unspecified: Secondary | ICD-10-CM | POA: Diagnosis not present

## 2021-12-05 DIAGNOSIS — I2510 Atherosclerotic heart disease of native coronary artery without angina pectoris: Secondary | ICD-10-CM | POA: Diagnosis not present

## 2021-12-05 DIAGNOSIS — I130 Hypertensive heart and chronic kidney disease with heart failure and stage 1 through stage 4 chronic kidney disease, or unspecified chronic kidney disease: Secondary | ICD-10-CM | POA: Diagnosis not present

## 2021-12-05 DIAGNOSIS — J45909 Unspecified asthma, uncomplicated: Secondary | ICD-10-CM | POA: Diagnosis not present

## 2021-12-05 DIAGNOSIS — M1990 Unspecified osteoarthritis, unspecified site: Secondary | ICD-10-CM | POA: Diagnosis not present

## 2021-12-05 DIAGNOSIS — H259 Unspecified age-related cataract: Secondary | ICD-10-CM | POA: Diagnosis not present

## 2021-12-11 DIAGNOSIS — J45909 Unspecified asthma, uncomplicated: Secondary | ICD-10-CM | POA: Diagnosis not present

## 2021-12-11 DIAGNOSIS — J40 Bronchitis, not specified as acute or chronic: Secondary | ICD-10-CM | POA: Diagnosis not present

## 2021-12-11 DIAGNOSIS — J329 Chronic sinusitis, unspecified: Secondary | ICD-10-CM | POA: Diagnosis not present

## 2021-12-11 DIAGNOSIS — Z20828 Contact with and (suspected) exposure to other viral communicable diseases: Secondary | ICD-10-CM | POA: Diagnosis not present

## 2021-12-14 NOTE — Progress Notes (Signed)
[facility]  [address] [place],  [place]  [address] [phone]  Clinic Day:  12/20/21  Referring physician: Angelina Sheriff, MD  This document serves as a record of services personally performed by Hosie Poisson, MD. It was created on their behalf by [facility] E, a trained medical scribe. The creation of this record is based on the scribe's personal observations and the provider's statements to them.  HISTORY OF PRESENT ILLNESS:  The patient is a 81 y.o. male with anemia secondary to mild renal insufficiency.  Despite having anemia, his hemoglobin has consistently been above 10 to where it has been followed without needing any particular intervention.  He comes in today for routine follow up.  Since his last visit, the patient has been doing okay.  He has a modest degree of fatigue, but denies having any overt forms of blood loss.    PHYSICAL EXAM:  Blood pressure (!) 160/74, pulse 64, temperature (!) 97.5 F (36.4 C), temperature source Oral, resp. rate (!) 22, weight 140 lb 11.2 oz (63.8 kg), SpO2 98 %. Wt Readings from Last 3 Encounters:  12/20/21 140 lb 11.2 oz (63.8 kg)  06/19/21 140 lb 4.8 oz (63.6 kg)  05/21/21 146 lb 12.8 oz (66.6 kg)   Body mass index is 19.85 kg/m. Performance status (ECOG): 1 Physical Exam Constitutional:      Appearance: Normal appearance. He is not ill-appearing.  HENT:     Mouth/Throat:     Mouth: Mucous membranes are moist.     Pharynx: Oropharynx is clear. No oropharyngeal exudate or posterior oropharyngeal erythema.  Cardiovascular:     Rate and Rhythm: Normal rate and regular rhythm.     Heart sounds: No murmur heard.   No friction rub. No gallop.  Pulmonary:     Effort: Pulmonary effort is normal. No respiratory distress.     Breath sounds: Normal breath sounds. No wheezing, rhonchi or rales.  Abdominal:     General: Bowel sounds are normal. There is no distension.     Palpations: Abdomen  is soft. There is no mass.     Tenderness: There is no abdominal tenderness.  Musculoskeletal:        General: No swelling.     Right lower leg: No edema.     Left lower leg: No edema.  Lymphadenopathy:     Cervical: No cervical adenopathy.     Upper Body:     Right upper body: No supraclavicular or axillary adenopathy.     Left upper body: No supraclavicular or axillary adenopathy.     Lower Body: No right inguinal adenopathy. No left inguinal adenopathy.  Skin:    General: Skin is warm.     Coloration: Skin is not jaundiced.     Findings: No lesion or rash.  Neurological:     General: No focal deficit present.     Mental Status: He is alert and oriented to person, place, and time. Mental status is at baseline.  Psychiatric:        Mood and Affect: Mood normal.        Behavior: Behavior normal.        Thought Content: Thought content normal.   LABS:    Latest Reference Range & Units 12/20/21 00:00  WBC  6.0  RBC 3.87 - 5.11  3.21 !  Hemoglobin 13.5 - 17.5  10.8 !  HCT 41 - 53  32 !  Platelets 150 - 399  257  !: Data is abnormal  Latest Reference Range & Units 12/20/21 00:00  Sodium 137 - 147  136 !  Potassium 3.4 - 5.3  4.4  Chloride 99 - 108  102  CO2 13 - 22  26 !  Glucose  82  BUN 4 - 21  29 !  Creatinine 0.6 - 1.3  0.9  Calcium 8.7 - 10.7  8.9  Alkaline Phosphatase 25 - 125  88  Albumin 3.5 - 5.0  4.0  AST 14 - 40  64 !  ALT 10 - 40  58 !  Bilirubin, Total  0.5  !: Data is abnormal  Latest Reference Range & Units 12/20/21 09:52  Iron 45 - 182 ug/dL 78  UIBC ug/dL 224  TIBC 250 - 450 ug/dL 302  Saturation Ratios 17.9 - 39.5 % 26  Ferritin 24 - 336 ng/mL 300   ASSESSMENT & PLAN:  Assessment/Plan:  A 81 y.o. male with anemia secondary to mild renal insufficiency.  His hemoglobin of 10.8 is better versus what it was 6 months ago.  I am pleased as his kidney parameters today remain decent.  As his hemoglobin remains above 10, he will continue to be followed  conservatively.  I will see this patient back in 6 months for repeat clinical assessment.  The patient understands all the plans discussed today and is in agreement with them.     I, Rita Ohara, am acting as scribe for Marice Potter, MD    I have reviewed this report as typed by the medical scribe, and it is complete and accurate.  Akito Boomhower Macarthur Critchley, MD

## 2021-12-20 ENCOUNTER — Inpatient Hospital Stay: Payer: PPO | Attending: Oncology

## 2021-12-20 ENCOUNTER — Encounter: Payer: Self-pay | Admitting: Oncology

## 2021-12-20 ENCOUNTER — Telehealth: Payer: Self-pay | Admitting: Oncology

## 2021-12-20 ENCOUNTER — Other Ambulatory Visit: Payer: Self-pay

## 2021-12-20 ENCOUNTER — Inpatient Hospital Stay: Payer: PPO | Admitting: Oncology

## 2021-12-20 VITALS — BP 160/74 | HR 64 | Temp 97.5°F | Resp 22 | Wt 140.7 lb

## 2021-12-20 DIAGNOSIS — N182 Chronic kidney disease, stage 2 (mild): Secondary | ICD-10-CM | POA: Diagnosis not present

## 2021-12-20 DIAGNOSIS — D631 Anemia in chronic kidney disease: Secondary | ICD-10-CM | POA: Insufficient documentation

## 2021-12-20 DIAGNOSIS — N189 Chronic kidney disease, unspecified: Secondary | ICD-10-CM

## 2021-12-20 LAB — IRON AND TIBC
Iron: 78 ug/dL (ref 45–182)
Saturation Ratios: 26 % (ref 17.9–39.5)
TIBC: 302 ug/dL (ref 250–450)
UIBC: 224 ug/dL

## 2021-12-20 LAB — HEPATIC FUNCTION PANEL
ALT: 58 — AB (ref 10–40)
AST: 64 — AB (ref 14–40)
Alkaline Phosphatase: 88 (ref 25–125)
Bilirubin, Total: 0.5

## 2021-12-20 LAB — BASIC METABOLIC PANEL
BUN: 29 — AB (ref 4–21)
CO2: 26 — AB (ref 13–22)
Chloride: 102 (ref 99–108)
Creatinine: 0.9 (ref 0.6–1.3)
Glucose: 82
Potassium: 4.4 (ref 3.4–5.3)
Sodium: 136 — AB (ref 137–147)

## 2021-12-20 LAB — FERRITIN: Ferritin: 300 ng/mL (ref 24–336)

## 2021-12-20 LAB — COMPREHENSIVE METABOLIC PANEL
Albumin: 4 (ref 3.5–5.0)
Calcium: 8.9 (ref 8.7–10.7)

## 2021-12-20 LAB — CBC AND DIFFERENTIAL
HCT: 32 — AB (ref 41–53)
Hemoglobin: 10.8 — AB (ref 13.5–17.5)
Neutrophils Absolute: 3.72
Platelets: 257 (ref 150–399)
WBC: 6

## 2021-12-20 LAB — CBC: RBC: 3.21 — AB (ref 3.87–5.11)

## 2021-12-20 NOTE — Telephone Encounter (Signed)
Per 12/20/21 los next appt scheduled and confirmed with patient °

## 2022-01-28 ENCOUNTER — Other Ambulatory Visit: Payer: Self-pay

## 2022-01-28 DIAGNOSIS — N189 Chronic kidney disease, unspecified: Secondary | ICD-10-CM

## 2022-01-28 DIAGNOSIS — H903 Sensorineural hearing loss, bilateral: Secondary | ICD-10-CM | POA: Insufficient documentation

## 2022-01-28 DIAGNOSIS — I259 Chronic ischemic heart disease, unspecified: Secondary | ICD-10-CM | POA: Insufficient documentation

## 2022-01-28 DIAGNOSIS — M109 Gout, unspecified: Secondary | ICD-10-CM | POA: Insufficient documentation

## 2022-01-28 HISTORY — DX: Chronic kidney disease, unspecified: N18.9

## 2022-01-28 HISTORY — DX: Chronic ischemic heart disease, unspecified: I25.9

## 2022-01-28 HISTORY — DX: Sensorineural hearing loss, bilateral: H90.3

## 2022-01-28 HISTORY — DX: Gout, unspecified: M10.9

## 2022-01-29 ENCOUNTER — Other Ambulatory Visit: Payer: Self-pay

## 2022-01-29 ENCOUNTER — Ambulatory Visit: Payer: PPO | Admitting: Cardiology

## 2022-01-29 ENCOUNTER — Encounter: Payer: Self-pay | Admitting: Cardiology

## 2022-01-29 DIAGNOSIS — E782 Mixed hyperlipidemia: Secondary | ICD-10-CM

## 2022-01-29 DIAGNOSIS — I2510 Atherosclerotic heart disease of native coronary artery without angina pectoris: Secondary | ICD-10-CM | POA: Diagnosis not present

## 2022-01-29 DIAGNOSIS — I10 Essential (primary) hypertension: Secondary | ICD-10-CM

## 2022-01-29 DIAGNOSIS — Z461 Encounter for fitting and adjustment of hearing aid: Secondary | ICD-10-CM | POA: Insufficient documentation

## 2022-01-29 HISTORY — DX: Encounter for fitting and adjustment of hearing aid: Z46.1

## 2022-01-29 MED ORDER — NITROGLYCERIN 0.4 MG SL SUBL: 0.4000 mg | SUBLINGUAL_TABLET | SUBLINGUAL | 11 refills | Status: DC | PRN

## 2022-01-29 NOTE — Progress Notes (Signed)
?Cardiology Office Note:   ? ?Date:  01/29/2022  ? ?ID:  Brian Dunn, DOB [birth year], MRN [id number] ? ?PCP:  Angelina Sheriff, MD  ?Cardiologist:  Jenean Lindau, MD  ? ?Referring MD: Angelina Sheriff, MD  ? ? ?ASSESSMENT:   ? ?1. Coronary artery disease involving native coronary artery of native heart without angina pectoris   ?2. Essential hypertension   ?3. Mixed hyperlipidemia   ? ?PLAN:   ? ?In order of problems listed above: ? ?Coronary artery disease: Secondary prevention stressed with the patient.  Importance of compliance with diet and medication stressed and he vocalized understanding.  He was advised to ambulate to the best of his ability. ?Essential hypertension: Blood pressure stable and diet was emphasized.  Lifestyle modification urged. ?Mixed dyslipidemia: Diet was emphasized and lifestyle modification was urged.  Lipids were reviewed and he will be back in the next few days for complete blood work. ?Patient will be seen in follow-up appointment in 9 months or earlier if the patient has any concerns ? ? ? ?Medication Adjustments/Labs and Tests Ordered: ?Current medicines are reviewed at length with the patient today.  Concerns regarding medicines are outlined above.  ?No orders of the defined types were placed in this encounter. ? ?No orders of the defined types were placed in this encounter. ? ? ? ?No chief complaint on file. ?  ? ?History of Present Illness:   ? ?Brian Dunn is a 81 y.o. male.  Patient has past medical history of coronary artery disease, essential hypertension and dyslipidemia.  He denies any problems at this time and takes care of activities of daily living.  He has significant orthopedic issues involving his back.  He denies any chest pain orthopnea or PND.  At the time of my evaluation, the patient is alert awake oriented and in no distress. ? ?Past Medical History:  ?Diagnosis Date  ? Acute bronchitis 05/29/2012  ? ALLERGIC RHINITIS 01/22/2007  ? Qualifier:  Diagnosis of  By: Larose Kells MD, [place] Allergic rhinitis   ? Anemia in chronic kidney disease 02/27/2021  ? Angina pectoris ([place]) 11/07/2020  ? Angina, class III ([place]) 11/07/2020  ? Asthma 06/18/2012  ? Followed in Pulmonary clinic/ [facility] [place]  - 01/09/2018  After extensive coaching inhaler device  effectiveness =   75% > continue symb 160 2bid and return for pfts in 3 months ? RA bronchiolitis?  - PFT's  04/23/2018  FEV1 2.84 (91 % ) ratio 73  p no % improvement from saba p symb 160 prior to study with DLCO  76 % corrects to 79  % for alv volume   - 04/23/2018  After extensive coaching  ? Bilateral inguinal hernia without obstruction or gangrene 06/20/2016  ? CAD (coronary artery disease), native coronary artery 06/20/2016  ? Chest pain, unspecified   ? Chronic ischemic heart disease, unspecified 01/28/2022  ? Chronic kidney disease 01/28/2022  ? COPD (chronic obstructive pulmonary disease) ([place])   ? Coronary artery disease involving native coronary artery of native heart with angina pectoris ([place]) 06/20/2016  ? Cough 03/25/2012  ? Followed in Pulmonary clinic/ [facility] [place]    - Trial off acei again  03/25/2012    - Sinus CT 01/24/11 There is mucosal thickening within the paranasal sinuses without  air-fluid levels. Neither ostiomeatal unit is patent.   ? Cough variant asthma 10/18/2010  ? Followed in Pulmonary clinic/ [facility] [place]  -HFA 75%  p coaching 01/22/2011  -PFT's  02/04/2011 minimal airflow obst, nl dlco   ? Disorder of bone and cartilage 01/22/2007  ? Qualifier: Diagnosis of  By: Larose Kells MD, [place]   Overview:  Overview:  Qualifier: Diagnosis of  By: Larose Kells MD, [place] Essential hypertension 11/06/2017  ? GERD 01/22/2007  ? Annotation: history of esophageal stricture Qualifier: Diagnosis of  By: Larose Kells MD, [place] GERD (gastroesophageal reflux disease)   ? Gout 01/28/2022  ? HIP FRACTURE 01/22/2007  ? Annotation: right-sided status post surgery Qualifier: Diagnosis of  By: Larose Kells MD,  [place] Hip fracture ([place])   ? Hyperlipidemia 06/20/2016  ? Hyperlipidemia with target LDL less than 70 06/20/2016  ? Hypertension   ? HYPOGONADISM 02/18/2007  ? Qualifier: Diagnosis of  By: Larose Kells MD, [place] Hypogonadism male   ? Low back pain 09/09/2013  ? Multiple lung nodules on CT 11/21/2015  ? CT [place] 10/17/15 mpns > 15 y since quit smoking > rec 12 m f/u as this is low risk > done 06/04/16 no change rec recheck in 33m - CT [place] 12/08/17 no change nodules > meets benign criteria > no directed f/u - Quantiferon GOLD TB 01/09/18 neg   ? Osteopenia   ? Personal history of malignant neoplasm of prostate   ? Postoperative visit 08/14/2016  ? Sensorineural hearing loss, bilateral 01/28/2022  ? Tremor, essential 02/18/2007  ? Overview:  Overview:  Qualifier: Diagnosis of  By: PLarose KellsMD, JArg[place] Last Assessment & Plan:  Reviewed tremor may increase on dulera so will need to be balanced against benefits to cough  ? ? ?Past Surgical History:  ?Procedure Laterality Date  ? CORONARY ANGIOPLASTY WITH STENT PLACEMENT  2008  ? CORONARY STENT INTERVENTION N/A 11/16/2020  ? Procedure: CORONARY STENT INTERVENTION;  Surgeon: HLeonie Man MD;  [facility];  Service: Cardiovascular;  Laterality: N/A;  ? INSERTION PROSTATE RADIATION SEED    ? LEFT HEART CATH AND CORONARY ANGIOGRAPHY N/A 11/16/2020  ? Procedure: LEFT HEART CATH AND CORONARY ANGIOGRAPHY;  Surgeon: HLeonie Man MD;  [facility];  Service: Cardiovascular;  Laterality: N/A;  ? ? ?Current Medications: ?Current Meds  ?Medication Sig  ? albuterol (PROAIR HFA) 108 (90 BASE) MCG/ACT inhaler Inhale 2 puffs into the lungs every 6 (six) hours as needed for wheezing.  ? allopurinol (ZYLOPRIM) 100 MG tablet Take 1 tablet by mouth daily.   ? ascorbic acid (VITAMIN C) 500 MG tablet Take 1 tablet by mouth daily.  ? aspirin EC 81 MG tablet Take 1 tablet (81 mg total) by mouth daily.  ? benzonatate (TESSALON) 200 MG capsule Take 200 mg  by mouth 3 (three) times daily as needed for cough.  ? budesonide-formoterol (SYMBICORT) 160-4.5 MCG/ACT inhaler Inhale 2 puffs into the lungs daily.  ? Calcium Carbonate-Vitamin D (OYSTER SHELL CALCIUM/D) 500-5 MG-MCG TABS Take 1 tablet by mouth daily.  ? celecoxib (CELEBREX) 200 MG capsule Take 200 mg by mouth daily.   ? clopidogrel (PLAVIX) 75 MG tablet TAKE 1 TABLET BY MOUTH DAILY WITH BREAKFAST.  ? Coenzyme Q10 (CO Q-10) 75 MG CAPS Take 120 mg by mouth daily.  ? famotidine (PEPCID) 40 MG tablet Take 40 mg by mouth daily.  ? ferrous sulfate 325 (65 FE) MG tablet Take 1 tablet by mouth 2 (two) times daily.  ? fluticasone (FLONASE) 50 MCG/ACT nasal spray Place 2 sprays  into the nose daily.  ? irbesartan (AVAPRO) 300 MG tablet Take 300 mg by mouth daily.   ? isosorbide mononitrate (IMDUR) 30 MG 24 hr tablet Take 0.5 tablets (15 mg total) by mouth daily.  ? loratadine (CLARITIN) 10 MG tablet Take 10 mg by mouth daily.   ? Magnesium 400 MG CAPS Take 400 mg by mouth 2 (two) times daily.  ? Multiple Vitamin (MULTIVITAMIN) capsule Take 1 capsule by mouth daily.  ? nitroGLYCERIN (NITROSTAT) 0.4 MG SL tablet Place 1 tablet (0.4 mg total) under the tongue every 5 (five) minutes as needed for chest pain.  ? pantoprazole (PROTONIX) 40 MG tablet Take 40 mg by mouth every morning.  ? pravastatin (PRAVACHOL) 40 MG tablet Take 1 tablet (40 mg total) by mouth daily.  ? primidone (MYSOLINE) 50 MG tablet Take 50 mg by mouth at bedtime.   ? promethazine-dextromethorphan (PROMETHAZINE-DM) 6.25-15 MG/5ML syrup Take 5 mLs by mouth 4 (four) times daily as needed for nausea/vomiting.  ? traMADol (ULTRAM) 50 MG tablet Take 50 mg by mouth 2 (two) times daily as needed for moderate pain.  ? vitamin B-12 (CYANOCOBALAMIN) 500 MCG tablet Take 500 mcg by mouth daily.  ?  ? ?Allergies:   Metoprolol, Morphine, Penicillin g, Penicillins, and Propranolol  ? ?Social History  ? ?Socioeconomic History  ? Marital status: Married  ?  Spouse name: Not on  file  ? Number of children: Not on file  ? Years of education: Not on file  ? Highest education level: Not on file  ?Occupational History  ? Not on file  ?Tobacco Use  ? Smoking status: Former  ?  Packs/d

## 2022-01-29 NOTE — Patient Instructions (Signed)
Medication Instructions:  ?Your physician recommends that you continue on your current medications as directed. Please refer to the Current Medication list given to you today.  ?*If you need a refill on your cardiac medications before your next appointment, please call your pharmacy* ? ? ?Lab Work: ?Your physician recommends that you return for lab work in: a few days- BMET, CBC, TSH, LFT's, Lipids, Vitamin D, HgbA1C ? ?You need to have labs done when you are fasting.  You can come Monday through Friday 8:30 am to 12:00 pm and 1:15 to 4:30. You do not need to make an appointment as the order has already been placed. The labs you are going to have done are BMET, CBC, TSH, LFT and Lipids.  ? ? ?Testing/Procedures: ?None Ordered ? ? ?Follow-Up: ?At [facility], you and your health needs are our priority.  As part of our continuing mission to provide you with exceptional heart care, we have created designated Provider Care Teams.  These Care Teams include your primary Cardiologist (physician) and Advanced Practice Providers (APPs -  Physician Assistants and Nurse Practitioners) who all work together to provide you with the care you need, when you need it. ? ?We recommend signing up for the patient portal called "MyChart".  Sign up information is provided on this After Visit Summary.  MyChart is used to connect with patients for Virtual Visits (Telemedicine).  Patients are able to view lab/test results, encounter notes, upcoming appointments, etc.  Non-urgent messages can be sent to your provider as well.   ?To learn more about what you can do with MyChart, go to [email or web address].   ? ?Your next appointment:   ?9 month(s) ? ?The format for your next appointment:   ?In Person ? ?Provider:   ?Jyl Heinz, MD  ? ? ?Other Instructions ?None  ?

## 2022-01-31 DIAGNOSIS — E782 Mixed hyperlipidemia: Secondary | ICD-10-CM | POA: Diagnosis not present

## 2022-01-31 DIAGNOSIS — J45909 Unspecified asthma, uncomplicated: Secondary | ICD-10-CM | POA: Diagnosis not present

## 2022-01-31 DIAGNOSIS — Z0000 Encounter for general adult medical examination without abnormal findings: Secondary | ICD-10-CM | POA: Diagnosis not present

## 2022-01-31 DIAGNOSIS — M1990 Unspecified osteoarthritis, unspecified site: Secondary | ICD-10-CM | POA: Diagnosis not present

## 2022-01-31 DIAGNOSIS — M8580 Other specified disorders of bone density and structure, unspecified site: Secondary | ICD-10-CM | POA: Diagnosis not present

## 2022-01-31 DIAGNOSIS — Z79899 Other long term (current) drug therapy: Secondary | ICD-10-CM | POA: Diagnosis not present

## 2022-01-31 DIAGNOSIS — I2510 Atherosclerotic heart disease of native coronary artery without angina pectoris: Secondary | ICD-10-CM | POA: Diagnosis not present

## 2022-01-31 DIAGNOSIS — Z681 Body mass index (BMI) 19 or less, adult: Secondary | ICD-10-CM | POA: Diagnosis not present

## 2022-01-31 DIAGNOSIS — D638 Anemia in other chronic diseases classified elsewhere: Secondary | ICD-10-CM | POA: Diagnosis not present

## 2022-01-31 DIAGNOSIS — M069 Rheumatoid arthritis, unspecified: Secondary | ICD-10-CM | POA: Diagnosis not present

## 2022-01-31 DIAGNOSIS — I700 Atherosclerosis of aorta: Secondary | ICD-10-CM | POA: Diagnosis not present

## 2022-01-31 DIAGNOSIS — I10 Essential (primary) hypertension: Secondary | ICD-10-CM | POA: Diagnosis not present

## 2022-01-31 DIAGNOSIS — M109 Gout, unspecified: Secondary | ICD-10-CM | POA: Diagnosis not present

## 2022-02-01 LAB — VITAMIN D 25 HYDROXY (VIT D DEFICIENCY, FRACTURES): Vit D, 25-Hydroxy: 61.6 ng/mL (ref 30.0–100.0)

## 2022-02-01 LAB — CBC
Hematocrit: 32.7 % — ABNORMAL LOW (ref 37.5–51.0)
Hemoglobin: 11.1 g/dL — ABNORMAL LOW (ref 13.0–17.7)
MCH: 32.5 pg (ref 26.6–33.0)
MCHC: 33.9 g/dL (ref 31.5–35.7)
MCV: 96 fL (ref 79–97)
Platelets: 262 10*3/uL (ref 150–450)
RBC: 3.42 x10E6/uL — ABNORMAL LOW (ref 4.14–5.80)
RDW: 11.8 % (ref 11.6–15.4)
WBC: 4.9 10*3/uL (ref 3.4–10.8)

## 2022-02-01 LAB — LIPID PANEL
Chol/HDL Ratio: 2 ratio (ref 0.0–5.0)
Cholesterol, Total: 148 mg/dL (ref 100–199)
HDL: 75 mg/dL (ref >39)
LDL Chol Calc (NIH): 63 mg/dL (ref 0–99)
Triglycerides: 45 mg/dL (ref 0–149)
VLDL Cholesterol Cal: 10 mg/dL (ref 5–40)

## 2022-02-01 LAB — BASIC METABOLIC PANEL
BUN/Creatinine Ratio: 24 (ref 10–24)
BUN: 24 mg/dL (ref 8–27)
CO2: 24 mmol/L (ref 20–29)
Calcium: 9.7 mg/dL (ref 8.6–10.2)
Chloride: 96 mmol/L (ref 96–106)
Creatinine, Ser: 0.99 mg/dL (ref 0.76–1.27)
Glucose: 84 mg/dL (ref 70–99)
Potassium: 4.7 mmol/L (ref 3.5–5.2)
Sodium: 132 mmol/L — ABNORMAL LOW (ref 134–144)
eGFR: 77 mL/min/{1.73_m2} (ref >59)

## 2022-02-01 LAB — HEPATIC FUNCTION PANEL
ALT: 28 IU/L (ref 0–44)
AST: 33 IU/L (ref 0–40)
Albumin: 4.4 g/dL (ref 3.6–4.6)
Alkaline Phosphatase: 65 IU/L (ref 44–121)
Bilirubin Total: 0.5 mg/dL (ref 0.0–1.2)
Bilirubin, Direct: 0.16 mg/dL (ref 0.00–0.40)
Total Protein: 7.2 g/dL (ref 6.0–8.5)

## 2022-02-01 LAB — HEMOGLOBIN A1C
Est. average glucose Bld gHb Est-mCnc: 108 mg/dL
Hgb A1c MFr Bld: 5.4 % (ref 4.8–5.6)

## 2022-02-01 LAB — TSH: TSH: 1.47 u[IU]/mL (ref 0.450–4.500)

## 2022-02-20 DIAGNOSIS — N3289 Other specified disorders of bladder: Secondary | ICD-10-CM | POA: Diagnosis not present

## 2022-02-20 DIAGNOSIS — C61 Malignant neoplasm of prostate: Secondary | ICD-10-CM | POA: Diagnosis not present

## 2022-04-10 ENCOUNTER — Other Ambulatory Visit: Payer: Self-pay | Admitting: Physician Assistant

## 2022-04-11 NOTE — Telephone Encounter (Signed)
Rx refill sent to pharmacy. 

## 2022-04-15 DIAGNOSIS — L255 Unspecified contact dermatitis due to plants, except food: Secondary | ICD-10-CM | POA: Diagnosis not present

## 2022-04-15 DIAGNOSIS — Z681 Body mass index (BMI) 19 or less, adult: Secondary | ICD-10-CM | POA: Diagnosis not present

## 2022-04-17 DIAGNOSIS — R531 Weakness: Secondary | ICD-10-CM | POA: Diagnosis not present

## 2022-04-17 DIAGNOSIS — J181 Lobar pneumonia, unspecified organism: Secondary | ICD-10-CM | POA: Diagnosis not present

## 2022-04-17 DIAGNOSIS — R21 Rash and other nonspecific skin eruption: Secondary | ICD-10-CM | POA: Diagnosis not present

## 2022-04-19 DIAGNOSIS — Z1331 Encounter for screening for depression: Secondary | ICD-10-CM | POA: Diagnosis not present

## 2022-04-19 DIAGNOSIS — Z681 Body mass index (BMI) 19 or less, adult: Secondary | ICD-10-CM | POA: Diagnosis not present

## 2022-04-19 DIAGNOSIS — Z79899 Other long term (current) drug therapy: Secondary | ICD-10-CM | POA: Diagnosis not present

## 2022-04-19 DIAGNOSIS — Z9181 History of falling: Secondary | ICD-10-CM | POA: Diagnosis not present

## 2022-04-19 DIAGNOSIS — Z8546 Personal history of malignant neoplasm of prostate: Secondary | ICD-10-CM | POA: Diagnosis not present

## 2022-04-19 DIAGNOSIS — R634 Abnormal weight loss: Secondary | ICD-10-CM | POA: Diagnosis not present

## 2022-04-19 DIAGNOSIS — R9389 Abnormal findings on diagnostic imaging of other specified body structures: Secondary | ICD-10-CM | POA: Diagnosis not present

## 2022-04-24 DIAGNOSIS — R9389 Abnormal findings on diagnostic imaging of other specified body structures: Secondary | ICD-10-CM | POA: Diagnosis not present

## 2022-04-24 DIAGNOSIS — R918 Other nonspecific abnormal finding of lung field: Secondary | ICD-10-CM | POA: Diagnosis not present

## 2022-04-24 DIAGNOSIS — R590 Localized enlarged lymph nodes: Secondary | ICD-10-CM | POA: Diagnosis not present

## 2022-04-24 DIAGNOSIS — I700 Atherosclerosis of aorta: Secondary | ICD-10-CM | POA: Diagnosis not present

## 2022-04-24 DIAGNOSIS — M4316 Spondylolisthesis, lumbar region: Secondary | ICD-10-CM | POA: Diagnosis not present

## 2022-04-24 DIAGNOSIS — I728 Aneurysm of other specified arteries: Secondary | ICD-10-CM | POA: Diagnosis not present

## 2022-04-24 DIAGNOSIS — K5730 Diverticulosis of large intestine without perforation or abscess without bleeding: Secondary | ICD-10-CM | POA: Diagnosis not present

## 2022-04-24 DIAGNOSIS — R634 Abnormal weight loss: Secondary | ICD-10-CM | POA: Diagnosis not present

## 2022-04-30 ENCOUNTER — Other Ambulatory Visit: Payer: Self-pay

## 2022-04-30 MED ORDER — ISOSORBIDE MONONITRATE ER 30 MG PO TB24: 15.0000 mg | ORAL_TABLET | Freq: Every day | ORAL | 2 refills | Status: DC

## 2022-06-18 NOTE — Progress Notes (Signed)
[facility]  [address] [place],  [place]  [address] [phone]  Clinic Day:  06/19/2022  Referring physician: Angelina Sheriff, MD  HISTORY OF PRESENT ILLNESS:  The patient is a 81 y.o. male with anemia secondary to mild renal insufficiency.  Despite having anemia, his hemoglobin has consistently been above 10 to where it has been followed without needing any particular intervention.  He comes in today for routine follow up.  Since his last visit, the patient has been doing okay.  The main issue he continues to deal with his progressive weight loss.  Of note, he had CT scans in June 2023 which showed no evidence of any ominous neoplastic disease.  Furthermore, he underwent an EGD in December 2022, for which only an esophageal stricture was seen and dilated.  As a pertains to his anemia, he denies having any overt forms of blood loss.  He believes he is getting an ample amount of caloric intake and on a daily basis despite his progressive weight loss.  PHYSICAL EXAM:  Blood pressure 126/64, pulse 72, temperature 97.8 F (36.6 C), resp. rate 16, height '5\' 9"'$  (1.753 m), weight 132 lb 3.2 oz (60 kg), SpO2 98 %. Wt Readings from Last 3 Encounters:  06/19/22 132 lb 3.2 oz (60 kg)  01/29/22 141 lb 9.6 oz (64.2 kg)  12/20/21 140 lb 11.2 oz (63.8 kg)   Body mass index is 19.52 kg/m. Performance status (ECOG): 1 Physical Exam Constitutional:      Appearance: Normal appearance. He is not ill-appearing.  HENT:     Mouth/Throat:     Mouth: Mucous membranes are moist.     Pharynx: Oropharynx is clear. No oropharyngeal exudate or posterior oropharyngeal erythema.  Cardiovascular:     Rate and Rhythm: Normal rate and regular rhythm.     Heart sounds: No murmur heard.    No friction rub. No gallop.  Pulmonary:     Effort: Pulmonary effort is normal. No respiratory distress.     Breath sounds: Normal breath sounds. No wheezing, rhonchi or rales.   Abdominal:     General: Bowel sounds are normal. There is no distension.     Palpations: Abdomen is soft. There is no mass.     Tenderness: There is no abdominal tenderness.  Musculoskeletal:        General: No swelling.     Right lower leg: No edema.     Left lower leg: No edema.  Lymphadenopathy:     Cervical: No cervical adenopathy.     Upper Body:     Right upper body: No supraclavicular or axillary adenopathy.     Left upper body: No supraclavicular or axillary adenopathy.     Lower Body: No right inguinal adenopathy. No left inguinal adenopathy.  Skin:    General: Skin is warm.     Coloration: Skin is not jaundiced.     Findings: No lesion or rash.  Neurological:     General: No focal deficit present.     Mental Status: He is alert and oriented to person, place, and time. Mental status is at baseline.  Psychiatric:        Mood and Affect: Mood normal.        Behavior: Behavior normal.        Thought Content: Thought content normal.    LABS:     ASSESSMENT & PLAN:  Assessment/Plan:  A 81 y.o. male with anemia secondary to mild  renal insufficiency.  His hemoglobin of 11.2 today is even better than what his hemoglobin has been over these past few years.  I am also please asked his renal function is essentially normal.  As a pertains to his weight loss, I will have our nutrition nurse consult with him and figure out better ways to increase his caloric intake on a daily basis.  Otherwise, as he is stable from a hematologic standpoint, I will see this patient back in 6 months for repeat clinical assessment.  The patient understands all the plans discussed today and is in agreement with them.    Jessina Marse Macarthur Critchley, MD

## 2022-06-18 NOTE — Progress Notes (Incomplete)
[facility]  [address] [place] [employer],  [place]  [address] [phone]  Clinic Day:  12/20/21  Referring physician: Angelina Sheriff, MD  HISTORY OF PRESENT ILLNESS:  The patient is a 81 y.o. male with anemia secondary to mild renal insufficiency.  Despite having anemia, his hemoglobin has consistently been above 10 to where it has been followed without needing any particular intervention.  He comes in today for routine follow up.  Since his last visit, the patient has been doing okay.  He has a modest degree of fatigue, but denies having any overt forms of blood loss.    PHYSICAL EXAM:  There were no vitals taken for this visit. Wt Readings from Last 3 Encounters:  01/29/22 141 lb 9.6 oz (64.2 kg)  12/20/21 140 lb 11.2 oz (63.8 kg)  06/19/21 140 lb 4.8 oz (63.6 kg)   There is no height or weight on file to calculate BMI. Performance status (ECOG): 1 Physical Exam Constitutional:      Appearance: Normal appearance. He is not ill-appearing.  HENT:     Mouth/Throat:     Mouth: Mucous membranes are moist.     Pharynx: Oropharynx is clear. No oropharyngeal exudate or posterior oropharyngeal erythema.  Cardiovascular:     Rate and Rhythm: Normal rate and regular rhythm.     Heart sounds: No murmur heard.    No friction rub. No gallop.  Pulmonary:     Effort: Pulmonary effort is normal. No respiratory distress.     Breath sounds: Normal breath sounds. No wheezing, rhonchi or rales.  Abdominal:     General: Bowel sounds are normal. There is no distension.     Palpations: Abdomen is soft. There is no mass.     Tenderness: There is no abdominal tenderness.  Musculoskeletal:        General: No swelling.     Right lower leg: No edema.     Left lower leg: No edema.  Lymphadenopathy:     Cervical: No cervical adenopathy.     Upper Body:     Right upper body: No supraclavicular or axillary adenopathy.     Left upper body: No supraclavicular or  axillary adenopathy.     Lower Body: No right inguinal adenopathy. No left inguinal adenopathy.  Skin:    General: Skin is warm.     Coloration: Skin is not jaundiced.     Findings: No lesion or rash.  Neurological:     General: No focal deficit present.     Mental Status: He is alert and oriented to person, place, and time. Mental status is at baseline.  Psychiatric:        Mood and Affect: Mood normal.        Behavior: Behavior normal.        Thought Content: Thought content normal.    LABS:    Latest Reference Range & Units 12/20/21 00:00  WBC  6.0  RBC 3.87 - 5.11  3.21 !  Hemoglobin 13.5 - 17.5  10.8 !  HCT 41 - 53  32 !  Platelets 150 - 399  257  !: Data is abnormal  Latest Reference Range & Units 12/20/21 00:00  Sodium 137 - 147  136 !  Potassium 3.4 - 5.3  4.4  Chloride 99 - 108  102  CO2 13 - 22  26 !  Glucose  82  BUN 4 - 21  29 !  Creatinine 0.6 -  1.3  0.9  Calcium 8.7 - 10.7  8.9  Alkaline Phosphatase 25 - 125  88  Albumin 3.5 - 5.0  4.0  AST 14 - 40  64 !  ALT 10 - 40  58 !  Bilirubin, Total  0.5  !: Data is abnormal  Latest Reference Range & Units 12/20/21 09:52  Iron 45 - 182 ug/dL 78  UIBC ug/dL 224  TIBC 250 - 450 ug/dL 302  Saturation Ratios 17.9 - 39.5 % 26  Ferritin 24 - 336 ng/mL 300   ASSESSMENT & PLAN:  Assessment/Plan:  A 81 y.o. male with anemia secondary to mild renal insufficiency.  His hemoglobin of 10.8 is better versus what it was 6 months ago.  I am pleased as his kidney parameters today remain decent.  As his hemoglobin remains above 10, he will continue to be followed conservatively.  I will see this patient back in 6 months for repeat clinical assessment.  The patient understands all the plans discussed today and is in agreement with them.     I, Rita Ohara, am acting as scribe for Marice Potter, MD    I have reviewed this report as typed by the medical scribe, and it is complete and accurate.  Diahann Guajardo Macarthur Critchley, MD

## 2022-06-19 ENCOUNTER — Inpatient Hospital Stay: Payer: PPO | Attending: Oncology | Admitting: Oncology

## 2022-06-19 ENCOUNTER — Inpatient Hospital Stay: Payer: PPO

## 2022-06-19 ENCOUNTER — Other Ambulatory Visit: Payer: Self-pay | Admitting: Oncology

## 2022-06-19 VITALS — BP 126/64 | HR 72 | Temp 97.8°F | Resp 16 | Ht 69.0 in | Wt 132.2 lb

## 2022-06-19 DIAGNOSIS — N189 Chronic kidney disease, unspecified: Secondary | ICD-10-CM | POA: Diagnosis not present

## 2022-06-19 DIAGNOSIS — N289 Disorder of kidney and ureter, unspecified: Secondary | ICD-10-CM | POA: Insufficient documentation

## 2022-06-19 DIAGNOSIS — D631 Anemia in chronic kidney disease: Secondary | ICD-10-CM | POA: Insufficient documentation

## 2022-06-19 DIAGNOSIS — R634 Abnormal weight loss: Secondary | ICD-10-CM | POA: Diagnosis not present

## 2022-06-19 LAB — BASIC METABOLIC PANEL
BUN: 20 (ref 4–21)
CO2: 28 — AB (ref 13–22)
Chloride: 98 — AB (ref 99–108)
Creatinine: 1.1 (ref 0.6–1.3)
Glucose: 90
Potassium: 4.4 mEq/L (ref 3.5–5.1)
Sodium: 132 — AB (ref 137–147)

## 2022-06-19 LAB — CBC: RBC: 3.35 — AB (ref 3.87–5.11)

## 2022-06-19 LAB — HEPATIC FUNCTION PANEL
ALT: 19 U/L (ref 10–40)
AST: 47 — AB (ref 14–40)
Alkaline Phosphatase: 39 (ref 25–125)
Bilirubin, Total: 0.6

## 2022-06-19 LAB — CBC AND DIFFERENTIAL
HCT: 33 — AB (ref 41–53)
Hemoglobin: 11.2 — AB (ref 13.5–17.5)
Neutrophils Absolute: 3.78
Platelets: 229 10*3/uL (ref 150–400)
WBC: 6.4

## 2022-06-19 LAB — COMPREHENSIVE METABOLIC PANEL
Albumin: 4.1 (ref 3.5–5.0)
Calcium: 9.3 (ref 8.7–10.7)

## 2022-06-19 LAB — IRON AND TIBC
Iron: 35 ug/dL — ABNORMAL LOW (ref 45–182)
Saturation Ratios: 14 % — ABNORMAL LOW (ref 17.9–39.5)
TIBC: 252 ug/dL (ref 250–450)
UIBC: 217 ug/dL

## 2022-06-19 LAB — FERRITIN: Ferritin: 285 ng/mL (ref 24–336)

## 2022-07-02 ENCOUNTER — Inpatient Hospital Stay: Payer: PPO | Admitting: Dietician

## 2022-07-02 DIAGNOSIS — H2513 Age-related nuclear cataract, bilateral: Secondary | ICD-10-CM | POA: Diagnosis not present

## 2022-07-02 DIAGNOSIS — H2512 Age-related nuclear cataract, left eye: Secondary | ICD-10-CM | POA: Diagnosis not present

## 2022-07-02 DIAGNOSIS — H35373 Puckering of macula, bilateral: Secondary | ICD-10-CM | POA: Diagnosis not present

## 2022-07-02 DIAGNOSIS — H18413 Arcus senilis, bilateral: Secondary | ICD-10-CM | POA: Diagnosis not present

## 2022-07-02 DIAGNOSIS — H25013 Cortical age-related cataract, bilateral: Secondary | ICD-10-CM | POA: Diagnosis not present

## 2022-07-02 NOTE — Progress Notes (Signed)
Nutrition Assessment   Reason for Assessment: Referral from Dr. Bobby Rumpf for weight loss   ASSESSMENT: Patient is 81 year old male who is followed by Dr. Bobby Rumpf for anemia secondary to mild renal insufficiency.  He has PMHx that includes CAD, HTN, HLD, Asthma, COPD, CKD, Hip Fracture, Osteopenia, Back Pain, GERD and Gout. Met with patient and spouse who report he has struggled with weight loss and his PO intake decreases through out the day with dinner being the worse meal.  The report he has issues with aspiration of dry crumbly foods (crackers, rice) he can tolerate solids if they are very moist and or washed down with liquids.  He reports uses ONS daily for about a year (900 calories). Patient reports he estimates usual calorie intake as 1500/day.  Usual PO Intake:  Bagel with butter & jelly...coffee with Matcha (uses it to wash bagel down)  Sandwich with lunch meat Greek yogurt & cookies. VHC at lunch  Dinner:  mashed potatoes, soft veg carrot & green beans (cream of broccoli, homemade tomato soup, like black bean soup, wife makes these)  Fluid: Gatorade 16 oz, Water (48 oz uses it to take pills) agreeable to switching to 2 glasses of milk daily and other high calorie fluids like juices  Medications: MVI, CO Q10 FeSO4, Protonix   Labs: 06/19/22  Na 132, Cl 98, Hgb 11.2 (improving)   Anthropometrics: weight loss 7.9# (6.5%) past 5 months  Height: 69"  Weight:  07/02/22  133.7# 06/19/22  132.3# 01/29/22   141.6# UBW: 175-180 (3 years ago) BMI: 19.52   Estimated Energy Needs  Kcals: 1800-2100 Protein: 60-72 Fluid: 2 L   NUTRITION DIAGNOSIS: Inadequate calories r/t aspiration, fatigue, and anorexia AEB usual PO intake and recent weight loss.   INTERVENTION:   Suggested changing timing of oral nutrition supplement VHC Boost after dinner 2 hours prior to bed  Encouraged weight once per month with goal weight gain 1-2 pounds per month.  Educated on importance of adequate  calorie and protein energy intake  with nutrient dense foods when possible to maintain weight/strength Discussed ways to add calories/protein to foods (adding cheese, cooking with butter, creamy sauces/gravy)  Encouraged soft moist high protein foods as well as small frequent meals/snacks -   Provided Nutrition Tip sheet  for  soft moist protein foods, high calorie snacking, anemia Contact information provided  MONITORING, EVALUATION, GOAL: weight trends, nutrition impact symptoms, PO intake, labs   Next Visit: next month remote  April Manson, RDN, LDN Registered Dietitian, [place] Part Time Remote (Usual office hours: Tuesday-Thursday) Mobile: [phone] Remote Office: [phone]

## 2022-07-30 ENCOUNTER — Ambulatory Visit: Payer: PPO | Admitting: Dietician

## 2022-07-30 NOTE — Progress Notes (Signed)
Nutrition Follow Up: Reached out to patient at home telephone number.  He states he's forcing himself to eat and trying to add extra calories where he can.  He is still drinking the 3 Boost a day he likes the [facility] and when he can get it drinks that once a day and usually the Boost Plus. At other times.  Medications: Now taking his iron on an empty stomach for better absorption.  He states   Labs: nnl   Anthropometrics: He reports weighed himself this morning and was 134#  Height: 69"  Weight:  07/30/22  134# 07/02/22  133.7# 06/19/22  132.3# 01/29/22   141.6# UBW: 175-180 (3 years ago) BMI: 19.52    Estimated Energy Needs  Kcals: 1800-2100 Protein: 60-72 Fluid: 2 L   NUTRITION DIAGNOSIS: Inadequate calories r/t aspiration, fatigue, and anorexia AEB usual PO intake and recent weight loss. Resolving as he has maintained weight past month.    INTERVENTION:   Continue to encourage small high protein high calories meals and snack with ONS.  Encouraged him to weigh himself monthly and if he isn't gaining 2# per month to reach out for more resources and suggestions. Contact information provided  MONITORING, EVALUATION, GOAL: weight trends, nutrition impact symptoms, PO intake, labs   Next Visit: PRN and patient or provider request  April Manson, RDN, LDN Registered Dietitian, [place] Part Time Remote (Usual office hours: Tuesday-Thursday) Mobile: [phone] Remote Office: [phone]

## 2022-09-03 DIAGNOSIS — Z681 Body mass index (BMI) 19 or less, adult: Secondary | ICD-10-CM | POA: Diagnosis not present

## 2022-09-03 DIAGNOSIS — H9193 Unspecified hearing loss, bilateral: Secondary | ICD-10-CM | POA: Diagnosis not present

## 2022-09-03 DIAGNOSIS — I10 Essential (primary) hypertension: Secondary | ICD-10-CM | POA: Diagnosis not present

## 2022-09-03 DIAGNOSIS — M545 Low back pain, unspecified: Secondary | ICD-10-CM | POA: Diagnosis not present

## 2022-09-03 DIAGNOSIS — Z23 Encounter for immunization: Secondary | ICD-10-CM | POA: Diagnosis not present

## 2022-09-03 DIAGNOSIS — G8929 Other chronic pain: Secondary | ICD-10-CM | POA: Diagnosis not present

## 2022-09-27 ENCOUNTER — Other Ambulatory Visit: Payer: Self-pay | Admitting: Cardiology

## 2022-11-04 HISTORY — PX: EYE SURGERY: SHX253

## 2022-11-06 DIAGNOSIS — H2512 Age-related nuclear cataract, left eye: Secondary | ICD-10-CM | POA: Diagnosis not present

## 2022-11-06 DIAGNOSIS — H52202 Unspecified astigmatism, left eye: Secondary | ICD-10-CM | POA: Diagnosis not present

## 2022-11-07 DIAGNOSIS — H2511 Age-related nuclear cataract, right eye: Secondary | ICD-10-CM | POA: Diagnosis not present

## 2022-11-09 DIAGNOSIS — J45909 Unspecified asthma, uncomplicated: Secondary | ICD-10-CM | POA: Diagnosis not present

## 2022-11-09 DIAGNOSIS — J209 Acute bronchitis, unspecified: Secondary | ICD-10-CM | POA: Diagnosis not present

## 2022-11-09 DIAGNOSIS — J019 Acute sinusitis, unspecified: Secondary | ICD-10-CM | POA: Diagnosis not present

## 2022-11-09 DIAGNOSIS — R509 Fever, unspecified: Secondary | ICD-10-CM | POA: Diagnosis not present

## 2022-11-19 DIAGNOSIS — J45909 Unspecified asthma, uncomplicated: Secondary | ICD-10-CM | POA: Diagnosis not present

## 2022-11-19 DIAGNOSIS — Z681 Body mass index (BMI) 19 or less, adult: Secondary | ICD-10-CM | POA: Diagnosis not present

## 2022-11-19 DIAGNOSIS — J189 Pneumonia, unspecified organism: Secondary | ICD-10-CM | POA: Diagnosis not present

## 2022-11-25 ENCOUNTER — Other Ambulatory Visit: Payer: Self-pay | Admitting: Cardiology

## 2022-12-02 DIAGNOSIS — H353131 Nonexudative age-related macular degeneration, bilateral, early dry stage: Secondary | ICD-10-CM | POA: Diagnosis not present

## 2022-12-02 DIAGNOSIS — H5203 Hypermetropia, bilateral: Secondary | ICD-10-CM | POA: Diagnosis not present

## 2022-12-02 DIAGNOSIS — H52201 Unspecified astigmatism, right eye: Secondary | ICD-10-CM | POA: Diagnosis not present

## 2022-12-02 DIAGNOSIS — H43392 Other vitreous opacities, left eye: Secondary | ICD-10-CM | POA: Diagnosis not present

## 2022-12-02 DIAGNOSIS — H2512 Age-related nuclear cataract, left eye: Secondary | ICD-10-CM | POA: Diagnosis not present

## 2022-12-02 DIAGNOSIS — H2511 Age-related nuclear cataract, right eye: Secondary | ICD-10-CM | POA: Diagnosis not present

## 2022-12-10 DIAGNOSIS — J329 Chronic sinusitis, unspecified: Secondary | ICD-10-CM | POA: Diagnosis not present

## 2022-12-10 DIAGNOSIS — J40 Bronchitis, not specified as acute or chronic: Secondary | ICD-10-CM | POA: Diagnosis not present

## 2022-12-19 NOTE — Progress Notes (Signed)
[facility]  [address] [place],  [place]  [address] [phone]  Clinic Day:  12/20/2022  Referring physician: Angelina Sheriff, MD  HISTORY OF PRESENT ILLNESS:  The patient is a 82 y.o. male with anemia secondary to mild renal insufficiency.  Despite having anemia, his hemoglobin has consistently been above 10 to where it has been followed without needing any particular intervention.  He comes in today for routine follow up.  Since his last visit, the patient has been doing okay.  He has been dealing with pneumonia, for which he is scheduled to see pulmonology next week.  With respect to his anemia, he denies having increased fatigue or any overt forms of blood loss.  PHYSICAL EXAM:  Blood pressure (!) 108/57, pulse 75, temperature 97.6 F (36.4 C), resp. rate 16, height 5' 9"$  (1.753 m), weight 130 lb 12.8 oz (59.3 kg), SpO2 96 %. Wt Readings from Last 3 Encounters:  12/20/22 130 lb 12.8 oz (59.3 kg)  06/19/22 132 lb 3.2 oz (60 kg)  01/29/22 141 lb 9.6 oz (64.2 kg)   Body mass index is 19.32 kg/m. Performance status (ECOG): 1 Physical Exam Constitutional:      Appearance: Normal appearance. He is not ill-appearing.     Comments: He looks thin  HENT:     Mouth/Throat:     Mouth: Mucous membranes are moist.     Pharynx: Oropharynx is clear. No oropharyngeal exudate or posterior oropharyngeal erythema.  Cardiovascular:     Rate and Rhythm: Normal rate and regular rhythm.     Heart sounds: No murmur heard.    No friction rub. No gallop.  Pulmonary:     Effort: Pulmonary effort is normal. No respiratory distress.     Breath sounds: Normal breath sounds. No wheezing, rhonchi or rales.  Abdominal:     General: Bowel sounds are normal. There is no distension.     Palpations: Abdomen is soft. There is no mass.     Tenderness: There is no abdominal tenderness.  Musculoskeletal:        General: No swelling.     Right lower leg: No edema.      Left lower leg: No edema.  Lymphadenopathy:     Cervical: No cervical adenopathy.     Upper Body:     Right upper body: No supraclavicular or axillary adenopathy.     Left upper body: No supraclavicular or axillary adenopathy.     Lower Body: No right inguinal adenopathy. No left inguinal adenopathy.  Skin:    General: Skin is warm.     Coloration: Skin is not jaundiced.     Findings: No lesion or rash.  Neurological:     General: No focal deficit present.     Mental Status: He is alert and oriented to person, place, and time. Mental status is at baseline.  Psychiatric:        Mood and Affect: Mood normal.        Behavior: Behavior normal.        Thought Content: Thought content normal.   LABS:    Latest Reference Range & Units 12/20/22 09:49  Sodium 135 - 145 mmol/L 133 (L)  Potassium 3.5 - 5.1 mmol/L 4.5  Chloride 98 - 111 mmol/L 100  CO2 22 - 32 mmol/L 25  Glucose 70 - 99 mg/dL 74  BUN 8 - 23 mg/dL 26 (H)  Creatinine 0.61 - 1.24 mg/dL 1.26 (H)  Calcium 8.9 -  10.3 mg/dL 9.3  Anion gap 5 - 15  8  Alkaline Phosphatase 38 - 126 U/L 53  Albumin 3.5 - 5.0 g/dL 3.5  AST 15 - 41 U/L 28  ALT 0 - 44 U/L 16  Total Protein 6.5 - 8.1 g/dL 7.4  Total Bilirubin 0.3 - 1.2 mg/dL 0.6  GFR, Est Non African American >60 mL/min 57 (L)  (L): Data is abnormally low (H): Data is abnormally high  Iron studies pending   ASSESSMENT & PLAN:  Assessment/Plan:  A 82 y.o. male with anemia secondary to mild renal insufficiency.  His hemoglobin of 11.1 is essentially unchanged versus what it was at his last visit.  I am also pleased as his renal function has not significantly fallen over time.  As he is stable from a hematologic standpoint, I will see this patient back in 6 months for repeat clinical assessment.  The patient understands all the plans discussed today and is in agreement with them.    Hairo Garraway Macarthur Critchley, MD

## 2022-12-20 ENCOUNTER — Other Ambulatory Visit: Payer: Self-pay | Admitting: Oncology

## 2022-12-20 ENCOUNTER — Inpatient Hospital Stay (INDEPENDENT_AMBULATORY_CARE_PROVIDER_SITE_OTHER): Payer: Medicare HMO | Admitting: Oncology

## 2022-12-20 ENCOUNTER — Inpatient Hospital Stay: Payer: Medicare HMO | Attending: Oncology

## 2022-12-20 ENCOUNTER — Telehealth: Payer: Self-pay

## 2022-12-20 VITALS — BP 108/57 | HR 75 | Temp 97.6°F | Resp 16 | Ht 69.0 in | Wt 130.8 lb

## 2022-12-20 DIAGNOSIS — D649 Anemia, unspecified: Secondary | ICD-10-CM | POA: Diagnosis not present

## 2022-12-20 DIAGNOSIS — N189 Chronic kidney disease, unspecified: Secondary | ICD-10-CM | POA: Diagnosis not present

## 2022-12-20 DIAGNOSIS — N289 Disorder of kidney and ureter, unspecified: Secondary | ICD-10-CM | POA: Insufficient documentation

## 2022-12-20 DIAGNOSIS — D631 Anemia in chronic kidney disease: Secondary | ICD-10-CM

## 2022-12-20 LAB — CBC AND DIFFERENTIAL
HCT: 32 — AB (ref 41–53)
Hemoglobin: 11.1 — AB (ref 13.5–17.5)
Neutrophils Absolute: 7.14
Platelets: 221 10*3/uL (ref 150–400)
WBC: 9.4

## 2022-12-20 LAB — IRON AND TIBC
Iron: 46 ug/dL (ref 45–182)
Saturation Ratios: 16 % — ABNORMAL LOW (ref 17.9–39.5)
TIBC: 288 ug/dL (ref 250–450)
UIBC: 242 ug/dL

## 2022-12-20 LAB — CMP (CANCER CENTER ONLY)
ALT: 16 U/L (ref 0–44)
AST: 28 U/L (ref 15–41)
Albumin: 3.5 g/dL (ref 3.5–5.0)
Alkaline Phosphatase: 53 U/L (ref 38–126)
Anion gap: 8 (ref 5–15)
BUN: 26 mg/dL — ABNORMAL HIGH (ref 8–23)
CO2: 25 mmol/L (ref 22–32)
Calcium: 9.3 mg/dL (ref 8.9–10.3)
Chloride: 100 mmol/L (ref 98–111)
Creatinine: 1.26 mg/dL — ABNORMAL HIGH (ref 0.61–1.24)
GFR, Estimated: 57 mL/min — ABNORMAL LOW (ref >60)
Glucose, Bld: 74 mg/dL (ref 70–99)
Potassium: 4.5 mmol/L (ref 3.5–5.1)
Sodium: 133 mmol/L — ABNORMAL LOW (ref 135–145)
Total Bilirubin: 0.6 mg/dL (ref 0.3–1.2)
Total Protein: 7.4 g/dL (ref 6.5–8.1)

## 2022-12-20 LAB — CBC: RBC: 3.3 — AB (ref 3.87–5.11)

## 2022-12-20 LAB — FERRITIN: Ferritin: 277 ng/mL (ref 24–336)

## 2022-12-20 NOTE — Telephone Encounter (Signed)
Dr Bobby Rumpf reviewed labs below and states to tell pt that his iron numbers are fine.   Latest Reference Range & Units 12/20/22 09:48  Iron 45 - 182 ug/dL 46  UIBC ug/dL 242  TIBC 250 - 450 ug/dL 288  Saturation Ratios 17.9 - 39.5 % 16 (L)  Ferritin 24 - 336 ng/mL 277  (L): Data is abnormally low

## 2022-12-24 ENCOUNTER — Other Ambulatory Visit: Payer: Self-pay | Admitting: Cardiology

## 2022-12-27 ENCOUNTER — Encounter: Payer: Self-pay | Admitting: Internal Medicine

## 2022-12-27 ENCOUNTER — Ambulatory Visit: Payer: Medicare HMO | Admitting: Internal Medicine

## 2022-12-27 ENCOUNTER — Ambulatory Visit (INDEPENDENT_AMBULATORY_CARE_PROVIDER_SITE_OTHER): Payer: Medicare HMO

## 2022-12-27 VITALS — BP 122/62 | HR 78 | Temp 98.1°F | Ht 70.0 in | Wt 132.6 lb

## 2022-12-27 DIAGNOSIS — J4520 Mild intermittent asthma, uncomplicated: Secondary | ICD-10-CM

## 2022-12-27 DIAGNOSIS — J439 Emphysema, unspecified: Secondary | ICD-10-CM | POA: Diagnosis not present

## 2022-12-27 DIAGNOSIS — R059 Cough, unspecified: Secondary | ICD-10-CM | POA: Diagnosis not present

## 2022-12-27 DIAGNOSIS — R1319 Other dysphagia: Secondary | ICD-10-CM | POA: Diagnosis not present

## 2022-12-27 DIAGNOSIS — R1310 Dysphagia, unspecified: Secondary | ICD-10-CM | POA: Insufficient documentation

## 2022-12-27 HISTORY — DX: Dysphagia, unspecified: R13.10

## 2022-12-27 NOTE — Patient Instructions (Addendum)
My office will be contacting you by phone for referral to [facility] and modified barium swallow at [facility]   - if you don't hear back from my office within one week please call us back or notify us thru MyChart and we'll address it right away.   Please remember to go to the  x-ray department  for your tests - we will call you with the results when they are available    Pulmonary follow up is as needed

## 2022-12-27 NOTE — Progress Notes (Unsigned)
Subjective:   Patient ID: Brian Dunn, male    DOB: 1941/04/16    MRN: [id number]   Brief patient profile:  34  yowm quit smoking in 2000 with no respiratory problems at all at that point and PFT's documented  minimal airflow obst  02/04/2011 c/w mild asthma rather than copd    History of Present Illness  October 18, 2010  1st pulmonary office eval for recurrent "pna" x 2 years  = rattling cough/ fatigue/ nasty mucus and difficulty breathing to point of needing recliner requiring prolonged abx improved but not completely better from July thru  November 2011  but on day of ov completely better x for fatigue and cough when eat and still requires mucinex assoc with intermittent hb and nasal congestion but no purulent secretions.  rec Stop flovent/ crevidol/ fish oil/ finsopril  Benicar 40 mg one daily bystolic 20 mg one daily     December 07, 2010       - Confused with meds 02/04/2011 ,  Try off coreg again and rec continue bisoprolol  01/22/2011  Ov much worse cough despite off ace, on bystolic and on ppi.  Esp severe at hs. No purulent sputum or overt hb  Rec CT sinus > neg.  rec Prilosec before breakfast and pepcid 20 mg one at bedtime  Mucinex dm is take 2 every 12 hours if needed for cough or congestion Dulera 200 Take  2 puffs first thing in am and then another 2 puffs about 12 hours later > much better   11/09/2014 f/u ov/Brian Dunn re: here for refills/doing fine  rec Continue the dulera 200 2 each am and add the pm dose if any flare of cough/ wheeze/shortness of breath    01/09/2018  f/u ov/Brian Dunn re: re -establish re abn ct  Chief Complaint  Patient presents with   Follow-up    ROV   last week Jan 2019 acute cough > yellow sputum  / chest congestion / sob  While being maint BREO (but expense an issue)  Self rx with otc, never got abx/ changed to symbiocort 1st week in Feb 2019 and better Dyspnea:  Now not limited by breathing from desired activities   Cough: none Sleep: ok SABA use:   symb 2 puffs q am rarely  rec No change in medications Symbicort 160 2 each am and 2 in pm if needed Work on inhaler technique: Please schedule a follow up visit in 3 months but call sooner if needed with pfts     04/23/2018  f/u ov/Brian Dunn re:  RA /mild asthma on symb 160 x 2 each am  Chief Complaint  Patient presents with   Follow-up    PFTs today, non-productive cough, GERD  Dyspnea:   Not limited by breathing from desired activities   Cough: minimal now mostly white  Sleep: flat ok  SABA use:  Never  Rec No change rx       12/27/2022  re-establish  ov/Brian Dunn re: asthma/FTT related to ? Aspiration  maint on symbicort 160    Chief Complaint  Patient presents with   Consult    Recurrent pneumonia and weight loss.  CXR 11/09/22  and 03/2022. Possible abn.  Aspiration pneumonia 07/01/2021 in hospital. [facility].  Dyspnea:  push cart at [place] / slow Cough: still coughing up white mucus tsp at most  Sleeping: no cc flat in bed  SABA use: none  02: none  Covid status:   vax max  No obvious day to day or daytime variability or assoc excess/ purulent sputum or mucus plugs or hemoptysis or cp or chest tightness, subjective wheeze or overt sinus or hb symptoms.   Sleeping  without nocturnal  or early am exacerbation  of respiratory  c/o's or need for noct saba. Also denies any obvious fluctuation of symptoms with weather or environmental changes or other aggravating or alleviating factors except as outlined above   No unusual exposure hx or h/o childhood pna/ asthma or knowledge of premature birth.  Current Allergies, Complete Past Medical History, Past Surgical History, Family History, and Social History were reviewed in [employer] record.  ROS  The following are not active complaints unless bolded Hoarseness, sore throat, dysphagia, dental problems, itching, sneezing,  nasal congestion or discharge of excess mucus or purulent secretions, ear ache,    fever, chills, sweats, unintended wt loss or wt gain, classically pleuritic or exertional cp,  orthopnea pnd or arm/hand swelling  or leg swelling, presyncope, palpitations, abdominal pain, anorexia, nausea, vomiting, diarrhea  or change in bowel habits or change in bladder habits, change in stools or change in urine, dysuria, hematuria,  rash, arthralgias, visual complaints, headache, numbness, weakness or ataxia or problems with walking or coordination,  change in mood or  memory.        Current Meds  Medication Sig   albuterol (PROAIR HFA) 108 (90 BASE) MCG/ACT inhaler Inhale 2 puffs into the lungs every 6 (six) hours as needed for wheezing.   allopurinol (ZYLOPRIM) 100 MG tablet Take 1 tablet by mouth daily.    ascorbic acid (VITAMIN C) 500 MG tablet Take 1 tablet by mouth daily.   aspirin EC 81 MG tablet Take 1 tablet (81 mg total) by mouth daily.   budesonide-formoterol (SYMBICORT) 160-4.5 MCG/ACT inhaler Inhale 2 puffs into the lungs daily.   Calcium Carbonate-Vitamin D (OYSTER SHELL CALCIUM/D) 500-5 MG-MCG TABS Take 1 tablet by mouth daily.   celecoxib (CELEBREX) 200 MG capsule Take 200 mg by mouth daily.    clopidogrel (PLAVIX) 75 MG tablet TAKE 1 TABLET BY MOUTH EVERY DAY WITH BREAKFAST   Coenzyme Q10 (CO Q-10) 75 MG CAPS Take 120 mg by mouth daily.   famotidine (PEPCID) 40 MG tablet Take 40 mg by mouth daily.   ferrous sulfate 325 (65 FE) MG tablet Take 1 tablet by mouth 2 (two) times daily.   fluticasone (FLONASE) 50 MCG/ACT nasal spray Place 2 sprays into the nose daily.   irbesartan (AVAPRO) 300 MG tablet Take 300 mg by mouth daily.    isosorbide mononitrate (IMDUR) 30 MG 24 hr tablet Take 0.5 tablets (15 mg total) by mouth daily.   loratadine (CLARITIN) 10 MG tablet Take 10 mg by mouth daily.    Magnesium 400 MG CAPS Take 400 mg by mouth 2 (two) times daily.   Multiple Vitamin (MULTIVITAMIN) capsule Take 1 capsule by mouth daily.   nitroGLYCERIN (NITROSTAT) 0.4 MG SL tablet Place  1 tablet (0.4 mg total) under the tongue every 5 (five) minutes as needed for chest pain.   pantoprazole (PROTONIX) 40 MG tablet Take 40 mg by mouth every morning.   pravastatin (PRAVACHOL) 40 MG tablet Take 1 tablet (40 mg total) by mouth daily.   primidone (MYSOLINE) 50 MG tablet Take 50 mg by mouth at bedtime.    traMADol (ULTRAM) 50 MG tablet Take 50 mg by mouth 2 (two) times daily as needed for moderate pain.   vitamin B-12 (CYANOCOBALAMIN) 500 MCG  tablet Take 500 mcg by mouth daily.            Past Medical History: COPD (ICD-496) gold 0      - HFA 75% p coaching 01/22/2011      - PFT's 02/04/2011 very minimal airflow obst   HYPOGONADISM (ICD-257.2) TREMOR, ESSENTIAL (ICD-333.1) CHEST PAIN (ICD-786.50) HIP FRACTURE (ICD-820.8) OSTEOPENIA (ICD-733.90) ALLERGIC RHINITIS (ICD-477.9) GERD (ICD-530.81) COUGH     - Sinus CT 01/24/11 There is mucosal thickening within the paranasal sinuses without  air-fluid levels. Neither ostiomeatal unit is patent. HX, PERSONAL, MALIGNANCY, PROSTATE (ICD-V10.46) HYPERTENSION (ICD-401.9)                Objective:   Physical Exam  wts  12/27/2022      132  04/23/2018      177 10/18/2010    173   Vital signs reviewed  12/27/2022  - Note at rest 02 sats  98% on RA   General appearance:    chronically ill thin amb elderly wm nad    HEENT : Oropharynx  clear      NECK :  without  apparent JVD/ palpable Nodes/TM    LUNGS: no acc muscle use,  Nl contour chest which is clear to A and P bilaterally without cough on insp or exp maneuvers   CV:  RRR  no s3 or murmur or increase in P2, and no edema   ABD:  soft and nontender with nl inspiratory excursion in the supine position. No bruits or organomegaly appreciated   MS:  Nl gait/ ext warm with ? mild RA deformities both hands s  obvious joint restrictions  calf tenderness, cyanosis or clubbing    SKIN: warm and dry without lesions    NEURO:  alert, approp, nl sensorium with  no motor or  cerebellar deficits apparent.     CXR PA and Lateral:   12/27/2022 :    I personally reviewed images and impression is as follows:     Mod copd only    Assessment & Plan:

## 2022-12-28 ENCOUNTER — Encounter: Payer: Self-pay | Admitting: Internal Medicine

## 2022-12-28 NOTE — Assessment & Plan Note (Signed)
Onset ? Aug 2022 p febrile illness ? Asp pna  -  MBS 12/27/2022 >>>  - GI eval 12/27/2022 >>>     Clearly the main concern here is ongoing severe dysphagia and so far only option is a feeding tube which he has declined and would like a second opinion > referred to GI and ST at [facility]   Discussed in detail all the  indications, usual  risks and alternatives  relative to the benefits with patient who agrees to proceed with w/u as outlined.

## 2022-12-28 NOTE — Assessment & Plan Note (Signed)
Quit smoking 2000  - 01/09/2018  After extensive coaching inhaler device  effectiveness =   75% > continue symb 160 2bid and return for pfts in 3 months ? RA bronchiolitis?  - PFT's  04/23/2018  FEV1 2.84 (91 % ) ratio 73  p no % improvement from saba p symb 160 prior to study with DLCO  76 % corrects to 79  % for alv volume   - 04/23/2018  After extensive coaching inhaler device  effectiveness =    75% (short Ti) - 12/27/2022   Walked on RA  x  2  lap(s) =  approx 300  ft  @ slow pace, stopped due to back pain  with lowest 02 sats 97%    Experiencing rapid geriactric decline in setting of severe dysphagia/ wt loss so nothing to offer from pulmonary perspective if can't improve his nutritional status and refuses tube feeding / advised.   Pulmonary f/u for worse sob/ cough / pleuritic cp prn   Each maintenance medication was reviewed in detail including emphasizing most importantly the difference between maintenance and prns and under what circumstances the prns are to be triggered using an action plan format where appropriate.  Total time for H and P, chart review, counseling, reviewing hfa  device(s) , directly observing portions of ambulatory 02 saturation study/ and generating customized AVS unique to this office visit / same day charting = > 45 min with pt not seen in > 3 y

## 2022-12-30 ENCOUNTER — Other Ambulatory Visit (HOSPITAL_COMMUNITY): Payer: Self-pay

## 2022-12-30 ENCOUNTER — Telehealth (HOSPITAL_COMMUNITY): Payer: Self-pay

## 2022-12-30 DIAGNOSIS — R1310 Dysphagia, unspecified: Secondary | ICD-10-CM

## 2022-12-30 NOTE — Telephone Encounter (Signed)
Attempted to contact patient to schedule Modified Barium Swallow - left voicemail. 

## 2023-01-07 DIAGNOSIS — Z681 Body mass index (BMI) 19 or less, adult: Secondary | ICD-10-CM | POA: Diagnosis not present

## 2023-01-07 DIAGNOSIS — E46 Unspecified protein-calorie malnutrition: Secondary | ICD-10-CM | POA: Diagnosis not present

## 2023-01-07 DIAGNOSIS — M8580 Other specified disorders of bone density and structure, unspecified site: Secondary | ICD-10-CM | POA: Diagnosis not present

## 2023-01-07 DIAGNOSIS — M1990 Unspecified osteoarthritis, unspecified site: Secondary | ICD-10-CM | POA: Diagnosis not present

## 2023-01-07 DIAGNOSIS — D638 Anemia in other chronic diseases classified elsewhere: Secondary | ICD-10-CM | POA: Diagnosis not present

## 2023-01-07 LAB — COMPREHENSIVE METABOLIC PANEL: EGFR: 60

## 2023-01-07 NOTE — Telephone Encounter (Signed)
Read rad results to PT he expressed understanding and wanted [place] to send results to his PCP Dr. Rex Kras.

## 2023-01-13 ENCOUNTER — Ambulatory Visit (HOSPITAL_COMMUNITY)
Admission: RE | Admit: 2023-01-13 | Discharge: 2023-01-13 | Disposition: A | Discharge status: Home / Self Care | Payer: Medicare HMO | Source: Ambulatory Visit | Attending: Family Medicine | Admitting: Family Medicine

## 2023-01-13 DIAGNOSIS — Z8701 Personal history of pneumonia (recurrent): Secondary | ICD-10-CM | POA: Insufficient documentation

## 2023-01-13 DIAGNOSIS — R1313 Dysphagia, pharyngeal phase: Secondary | ICD-10-CM | POA: Diagnosis not present

## 2023-01-13 DIAGNOSIS — R1310 Dysphagia, unspecified: Secondary | ICD-10-CM | POA: Diagnosis not present

## 2023-01-13 DIAGNOSIS — R053 Chronic cough: Secondary | ICD-10-CM | POA: Insufficient documentation

## 2023-01-13 DIAGNOSIS — R1319 Other dysphagia: Secondary | ICD-10-CM

## 2023-01-13 NOTE — Progress Notes (Signed)
Modified Barium Swallow Study  Patient Details  Name: Brian Dunn MRN: [id number] Date of Birth: 1941/07/30  Today's Date: 01/13/2023  Modified Barium Swallow completed.  Full report located under Chart Review in the Imaging Section.  History of Present Illness 82 y.o. male with hx of recurrent pna, 45 lb weight loss referred for OP MBS per pulmonology. Hx includes GERD, anemia, chronic cough, and oropharyngeal dysphagia. Pt reports having undergone two prior MBS studies at [facility] in 2021 and 2022 (no record available). He reports constant coughing when eating/drinking, attributing more difficulty to solid foods than liquids.   Clinical Impression Mr. Fritchie presents with a complex pharyngeal dysphagia.  Oral phase is relatively normal.  The anatomy of his pharynx is marked by lordosis of his cervical spine, thinning of the prevertebral space, and what appears to be hypertrophy in the mid-superior pharynx (presents similarly to CP bar but located anatomically higher).  There is poor base-of-tongue to pharyngeal wall contact and the epiglottis is unable to invert past its horizontal position.  There is good laryngeal elevation; the arytenoids never fully meet the epiglottic petiole, There is minimal distension of the PES.  All of these factors lead to significant residue that accumulates in the pharynx, regardess of consistency, and is aspirated either during or after the swallow response.  Multiple positional changes were attempted in an effort to manipulate bolus flow (reclined position, head turns, chin tuck). A reclined position helped minimize aspiration of thins as gravity directed their travel down posterior pharyngeal wall, but aspiration was not eliminated.  Mr. Berdine and I reviewed the video after the study.  We talked about some of the anatomical and physiological challenges -vertebrae changes, weakening of muscles in base of tongue and pharynx, tightening of proximal  esophagus. Eliminating aspiration is not likely possible, but there may be ways to manage the aspiration and its adverse effects.   Recommend: 1) consideration of further assessment of pharyngeal/laryngeal structures (referral to ENT at [place]); 2) consider intervention to assist with PES patency; 3) OP SLP to address respiratory muscle training; 4) frequent oral care; 5) increase aerobic exercise as able (back pain has been limiting). Placed phone call to Mr. Hassinger this afternoon and left voice mail so that we can further discuss results/recommendations. Factors that may increase risk of adverse event in presence of aspiration Phineas Douglas & Padilla 2021): Weak cough;Aspiration of thick, dense, and/or acidic materials;Frequent aspiration of large volumes  Swallow Evaluation Recommendations Recommendations: PO diet PO Diet Recommendation: Regular;Thin liquids (Level 0) Liquid Administration via: Cup;Spoon;Straw Supervision: Patient able to self-feed Postural changes:  (try reclining when sipping liquids) Oral care recommendations: Oral care QID (4x/day);Oral care before ice chips/water Recommended consults: Consider ENT consultation    Tenley Winward L. Tivis Ringer, MA CCC/SLP Clinical Specialist - Acute Care SLP Acute Rehabilitation Services Office number [phone]   Juan Quam Laurice 01/13/2023,2:43 PM

## 2023-01-14 NOTE — Progress Notes (Signed)
Speech Pathology: 01/14/23 1610  I was able to reach Brian Dunn via home phone to review results and recommendations from yesterday's MBS.  I recommended that he call Dr. Melvyn Novas and request referral to ENT at the [facility] at [facility]. He stated that prior ENT assessment in [place] did not lead to solutions, but that he would be willing to drive to [place] for consultation.  He would benefit from further evaluation given the severity of his dysphagia.  Encouraged him to reach out to our SLP service here at [facility] if he needs further assistance.  Theresa Dohrman L. Tivis Ringer, MA CCC/SLP Clinical Specialist - [place] Office number [phone]

## 2023-01-18 ENCOUNTER — Other Ambulatory Visit: Payer: Self-pay | Admitting: Cardiology

## 2023-01-20 ENCOUNTER — Telehealth: Payer: Self-pay | Admitting: Internal Medicine

## 2023-01-20 DIAGNOSIS — R1319 Other dysphagia: Secondary | ICD-10-CM

## 2023-01-20 NOTE — Telephone Encounter (Signed)
Fine with me

## 2023-01-20 NOTE — Telephone Encounter (Signed)
Dr. Melvyn Novas Please advise if your okay for the referral to ENT being placed

## 2023-01-20 NOTE — Telephone Encounter (Signed)
Referral has been placed to ENT in [place]. Nothing further needed.

## 2023-01-20 NOTE — Telephone Encounter (Signed)
PT has swallowing issue and was referred to rehab by Dr. Viona Gilmore.. The therapist said they would like PT to see a ENT specialist and recommend [facility] in [facility]. Pt would like referral sent.  Pls call PT to advise next steps.   [phone]

## 2023-01-20 NOTE — Telephone Encounter (Signed)
Rx refill sent to pharmacy. 

## 2023-02-07 ENCOUNTER — Ambulatory Visit: Payer: Medicare HMO | Attending: Cardiology | Admitting: Cardiology

## 2023-02-07 ENCOUNTER — Encounter: Payer: Self-pay | Admitting: Cardiology

## 2023-02-07 VITALS — BP 124/78 | HR 69 | Ht 69.6 in | Wt 132.4 lb

## 2023-02-07 DIAGNOSIS — I10 Essential (primary) hypertension: Secondary | ICD-10-CM | POA: Diagnosis not present

## 2023-02-07 DIAGNOSIS — I2510 Atherosclerotic heart disease of native coronary artery without angina pectoris: Secondary | ICD-10-CM | POA: Diagnosis not present

## 2023-02-07 DIAGNOSIS — E785 Hyperlipidemia, unspecified: Secondary | ICD-10-CM | POA: Diagnosis not present

## 2023-02-07 DIAGNOSIS — N189 Chronic kidney disease, unspecified: Secondary | ICD-10-CM

## 2023-02-07 DIAGNOSIS — I728 Aneurysm of other specified arteries: Secondary | ICD-10-CM

## 2023-02-07 HISTORY — DX: Aneurysm of other specified arteries: I72.8

## 2023-02-07 NOTE — Patient Instructions (Signed)
Medication Instructions:  Your physician recommends that you continue on your current medications as directed. Please refer to the Current Medication list given to you today.  *If you need a refill on your cardiac medications before your next appointment, please call your pharmacy*   Lab Work: Your physician recommends that you have a CMP and Lipids.  If you have labs (blood work) drawn today and your tests are completely normal, you will receive your results only by: MyChart Message (if you have MyChart) OR A paper copy in the mail If you have any lab test that is abnormal or we need to change your treatment, we will call you to review the results.   Testing/Procedures: None ordered   Follow-Up: At [facility], you and your health needs are our priority.  As part of our continuing mission to provide you with exceptional heart care, we have created designated Provider Care Teams.  These Care Teams include your primary Cardiologist (physician) and Advanced Practice Providers (APPs -  Physician Assistants and Nurse Practitioners) who all work together to provide you with the care you need, when you need it.  We recommend signing up for the patient portal called "MyChart".  Sign up information is provided on this After Visit Summary.  MyChart is used to connect with patients for Virtual Visits (Telemedicine).  Patients are able to view lab/test results, encounter notes, upcoming appointments, etc.  Non-urgent messages can be sent to your provider as well.   To learn more about what you can do with MyChart, go to [email or web address].    Your next appointment:   9 month(s)  The format for your next appointment:   In Person  Provider:   Belva Crome, MD    Other Instructions none  Important Information About Sugar

## 2023-02-07 NOTE — Progress Notes (Signed)
Cardiology Office Note:    Date:  02/07/2023   ID:  Brian Dunn, DOB October 29, 1941, MRN [id number]  PCP:  Noni Saupe, MD  Cardiologist:  Garwin Brothers, MD   Referring MD: Noni Saupe, MD    ASSESSMENT:    1. Coronary artery disease involving native coronary artery of native heart without angina pectoris   2. Benign essential hypertension   3. Chronic kidney disease, unspecified CKD stage   4. Hyperlipidemia with target LDL less than 70   5. Aneurysm of right subclavian artery    PLAN:    In order of problems listed above:  Coronary artery disease: Secondary prevention stressed with the patient.  Importance of compliance with diet medication stressed any vocalized understanding. Essential hypertension: Blood pressure stable and diet was emphasized.  Lifestyle modification urged. Mixed dyslipidemia: On lipid-lowering medications.  He is fasting and will have lipid work done today.  Blood work Renal insufficiency: I reviewed this.  Stable.  I discussed this with him. Right subclavian artery aneurysm: I discussed findings with the patient and questions were answered to his satisfaction.  I will refer him to vascular specialist for opinion. Patient will be seen in follow-up appointment in 9 months or earlier if the patient has any concerns.    Medication Adjustments/Labs and Tests Ordered: Current medicines are reviewed at length with the patient today.  Concerns regarding medicines are outlined above.  No orders of the defined types were placed in this encounter.  No orders of the defined types were placed in this encounter.    No chief complaint on file.    History of Present Illness:    Brian Dunn is a 82 y.o. male.  Patient has past medical history of coronary artery disease, essential hypertension, mixed dyslipidemia and renal insufficiency.  Reviewed CT scan from [place] hospital and it reveals right subclavian artery aneurysm.  A vascular  consultation was suggested.  Patient is unaware of this finding.  He denies any chest pain orthopnea or PND.  At the time of my evaluation, the patient is alert awake oriented and in no distress.  Past Medical History:  Diagnosis Date   Acute bronchitis 05/29/2012   Allergic rhinitis    Anemia in chronic kidney disease 02/27/2021   Angina pectoris 11/07/2020   Angina, class III 11/07/2020   Asthma 06/18/2012   Followed in Pulmonary clinic/ [facility]/ [place]  - 01/09/2018  After extensive coaching inhaler device  effectiveness =   75% > continue symb 160 2bid and return for pfts in 3 months ? RA bronchiolitis?  - PFT's  04/23/2018  FEV1 2.84 (91 % ) ratio 73  p no % improvement from saba p symb 160 prior to study with DLCO  76 % corrects to 79  % for alv volume   - 04/23/2018  After extensive coaching   Benign essential hypertension 06/20/2016   Bilateral inguinal hernia without obstruction or gangrene 06/20/2016   CAD (coronary artery disease), native coronary artery 06/20/2016   Chest pain, unspecified    Chronic ischemic heart disease, unspecified 01/28/2022   Chronic kidney disease 01/28/2022   COPD (chronic obstructive pulmonary disease)    Coronary artery disease involving native coronary artery of native heart with angina pectoris 06/20/2016   Cough 03/25/2012   Followed in Pulmonary clinic/ [facility]/ [place]    - Trial off acei again  03/25/2012    - Sinus CT 01/24/11 There is mucosal thickening within the  paranasal sinuses without  air-fluid levels. Neither ostiomeatal unit is patent.    Cough variant asthma 10/18/2010   Followed in Pulmonary clinic/ [facility]/ [place]  -HFA 75% p coaching 01/22/2011  -PFT's  02/04/2011 minimal airflow obst, nl dlco    Disorder of bone and cartilage 01/22/2007   Qualifier: Diagnosis of  By: Drue NovelPaz MD, Nolon RodJose E.   Overview:  Overview:  Qualifier: Diagnosis of  By: Drue NovelPaz MD, Jose E.   Dysphagia 12/27/2022   Onset ? Aug 2022 p febrile illness ? Asp  pna   -  MBS 01/13/23   PO Diet Recommendation: Regular; Thin liquids (Level 0)  Liquid Administration via: Cup; Spoon; Straw  Supervision: Patient able to self-feed  Postural changes: -- (try reclining when sipping liquids)  Oral care recommendations: Oral care QID (4x/day); Oral care before ice   chips/water  Recommended consults: Consider ENT consultation   Encounter for fitting and adjustment of hearing aid 01/29/2022   Essential hypertension 11/06/2017   GERD 01/22/2007   Annotation: history of esophageal stricture Qualifier: Diagnosis of  By: Drue NovelPaz MD, Nolon RodJose E.    GERD (gastroesophageal reflux disease)    Gout 01/28/2022   Hip fracture    HIP FRACTURE 01/22/2007   Annotation: right-sided status post surgery Qualifier: Diagnosis of  By: Drue NovelPaz MD, Nolon RodJose E.    Hyperlipidemia 06/20/2016   Hyperlipidemia with target LDL less than 70 06/20/2016   Hypertension    HYPOGONADISM 02/18/2007   Qualifier: Diagnosis of  By: Drue NovelPaz MD, Nolon RodJose E.    Hypogonadism male    Low back pain 09/09/2013   Multiple lung nodules on CT 11/21/2015   CT  10/17/15 mpns > 15 y since quit smoking > rec 12 m f/u as this is low risk > done 06/04/16 no change rec recheck in 17m  - CT  12/08/17 no change nodules > meets benign criteria > no directed f/u - Quantiferon GOLD TB 01/09/18 neg    Osteopenia    Personal history of malignant neoplasm of prostate    Postoperative visit 08/14/2016   Sensorineural hearing loss, bilateral 01/28/2022   Tremor, essential 02/18/2007   Overview:  Overview:  Qualifier: Diagnosis of  By: Drue NovelPaz MD, Nolon RodJose E.  Last Assessment & Plan:  Reviewed tremor may increase on dulera so will need to be balanced against benefits to cough    Past Surgical History:  Procedure Laterality Date   CORONARY ANGIOPLASTY WITH STENT PLACEMENT  2008   CORONARY STENT INTERVENTION N/A 11/16/2020   Procedure: CORONARY STENT INTERVENTION;  Surgeon: Marykay LexHarding, David W, MD;  [facility];  Service:  Cardiovascular;  Laterality: N/A;   INSERTION PROSTATE RADIATION SEED     LEFT HEART CATH AND CORONARY ANGIOGRAPHY N/A 11/16/2020   Procedure: LEFT HEART CATH AND CORONARY ANGIOGRAPHY;  Surgeon: Marykay LexHarding, David W, MD;  [facility];  Service: Cardiovascular;  Laterality: N/A;    Current Medications: Current Meds  Medication Sig   albuterol (PROAIR HFA) 108 (90 BASE) MCG/ACT inhaler Inhale 2 puffs into the lungs every 6 (six) hours as needed for wheezing.   allopurinol (ZYLOPRIM) 100 MG tablet Take 1 tablet by mouth daily.    ascorbic acid (VITAMIN C) 500 MG tablet Take 1 tablet by mouth daily.   aspirin EC 81 MG tablet Take 1 tablet (81 mg total) by mouth daily.   budesonide-formoterol (SYMBICORT) 160-4.5 MCG/ACT inhaler Inhale 2 puffs into the lungs daily.   Calcium Carbonate-Vitamin D (OYSTER SHELL CALCIUM/D)  500-5 MG-MCG TABS Take 1 tablet by mouth daily.   celecoxib (CELEBREX) 200 MG capsule Take 200 mg by mouth daily.    clopidogrel (PLAVIX) 75 MG tablet TAKE 1 TABLET BY MOUTH EVERY DAY WITH BREAKFAST   Coenzyme Q10 (CO Q-10) 75 MG CAPS Take 120 mg by mouth daily.   famotidine (PEPCID) 40 MG tablet Take 40 mg by mouth daily.   ferrous sulfate 325 (65 FE) MG tablet Take 1 tablet by mouth 2 (two) times daily.   fluticasone (FLONASE) 50 MCG/ACT nasal spray Place 2 sprays into the nose daily.   irbesartan (AVAPRO) 300 MG tablet Take 300 mg by mouth daily.    isosorbide mononitrate (IMDUR) 30 MG 24 hr tablet Take 0.5 tablets (15 mg total) by mouth daily.   loratadine (CLARITIN) 10 MG tablet Take 10 mg by mouth daily.    Magnesium 400 MG CAPS Take 400 mg by mouth 2 (two) times daily.   Multiple Vitamin (MULTIVITAMIN) capsule Take 1 capsule by mouth daily.   nitroGLYCERIN (NITROSTAT) 0.4 MG SL tablet Place 1 tablet (0.4 mg total) under the tongue every 5 (five) minutes as needed for chest pain.   pantoprazole (PROTONIX) 40 MG tablet Take 40 mg by mouth every morning.    pravastatin (PRAVACHOL) 40 MG tablet TAKE 1 TABLET BY MOUTH EVERY DAY   primidone (MYSOLINE) 50 MG tablet Take 50 mg by mouth at bedtime.    traMADol (ULTRAM) 50 MG tablet Take 50 mg by mouth 2 (two) times daily as needed for moderate pain.   vitamin B-12 (CYANOCOBALAMIN) 500 MCG tablet Take 500 mcg by mouth daily.     Allergies:   Metoprolol, Morphine, Penicillin g, and Penicillins   Social History   Socioeconomic History   Marital status: Married    Spouse name: Not on file   Number of children: Not on file   Years of education: Not on file   Highest education level: Not on file  Occupational History   Not on file  Tobacco Use   Smoking status: Former    Packs/day: 1.00    Years: 35.00    Additional pack years: 0.00    Total pack years: 35.00    Types: Cigarettes    Quit date: 11/04/1996    Years since quitting: 26.2   Smokeless tobacco: Never  Substance and Sexual Activity   Alcohol use: Not on file    Comment: ETOH daily   Drug use: Yes   Sexual activity: Not on file  Other Topics Concern   Not on file  Social History Narrative   Married and has children.  Works in Air[employer].   Social Determinants of Health   Financial Resource Strain: Not on file  Food Insecurity: Not on file  Transportation Needs: Not on file  Physical Activity: Not on file  Stress: Not on file  Social Connections: Not on file     Family History: The patient's family history includes Asthma in his father and sister; Heart disease in his mother and sister; Parkinsonism in his father; Throat cancer in his sister.  ROS:   Please see the history of present illness.    All other systems reviewed and are negative.  EKGs/Labs/Other Studies Reviewed:    The following studies were reviewed today: I discussed my findings with the patient at length   Recent Labs: 12/20/2022: ALT 16; BUN 26; Creatinine 1.26; Hemoglobin 11.1; Platelets 221; Potassium 4.5; Sodium 133  Recent Lipid Panel    Component  Value Date/Time   CHOL 148 01/31/2022 0920   TRIG 45 01/31/2022 0920   HDL 75 01/31/2022 0920   CHOLHDL 2.0 01/31/2022 0920   CHOLHDL 2.5 CALC 01/22/2007 0923   VLDL 19 01/22/2007 0923   LDLCALC 63 01/31/2022 0920   LDLDIRECT 117.9 01/22/2007 0923    Physical Exam:    VS:  BP 124/78   Pulse 69   Ht 5' 9.6" (1.768 m)   Wt 132 lb 6.4 oz (60.1 kg)   SpO2 95%   BMI 19.22 kg/m     Wt Readings from Last 3 Encounters:  02/07/23 132 lb 6.4 oz (60.1 kg)  12/27/22 132 lb 9.6 oz (60.1 kg)  12/20/22 130 lb 12.8 oz (59.3 kg)     GEN: Patient is in no acute distress HEENT: Normal NECK: No JVD; No carotid bruits LYMPHATICS: No lymphadenopathy CARDIAC: Hear sounds regular, 2/6 systolic murmur at the apex. RESPIRATORY:  Clear to auscultation without rales, wheezing or rhonchi  ABDOMEN: Soft, non-tender, non-distended MUSCULOSKELETAL:  No edema; No deformity  SKIN: Warm and dry NEUROLOGIC:  Alert and oriented x 3 PSYCHIATRIC:  Normal affect   Signed, Garwin Brothers, MD  02/07/2023 9:54 AM    [facility]

## 2023-02-08 LAB — LIPID PANEL
Chol/HDL Ratio: 1.9 ratio (ref 0.0–5.0)
Cholesterol, Total: 144 mg/dL (ref 100–199)
HDL: 74 mg/dL (ref >39)
LDL Chol Calc (NIH): 58 mg/dL (ref 0–99)
Triglycerides: 59 mg/dL (ref 0–149)
VLDL Cholesterol Cal: 12 mg/dL (ref 5–40)

## 2023-02-08 LAB — COMPREHENSIVE METABOLIC PANEL
ALT: 20 IU/L (ref 0–44)
AST: 24 IU/L (ref 0–40)
Albumin/Globulin Ratio: 1.4 (ref 1.2–2.2)
Albumin: 4.2 g/dL (ref 3.7–4.7)
Alkaline Phosphatase: 70 IU/L (ref 44–121)
BUN/Creatinine Ratio: 27 — ABNORMAL HIGH (ref 10–24)
BUN: 27 mg/dL (ref 8–27)
Bilirubin Total: 0.5 mg/dL (ref 0.0–1.2)
CO2: 21 mmol/L (ref 20–29)
Calcium: 9.8 mg/dL (ref 8.6–10.2)
Chloride: 98 mmol/L (ref 96–106)
Creatinine, Ser: 1 mg/dL (ref 0.76–1.27)
Globulin, Total: 3 g/dL (ref 1.5–4.5)
Glucose: 81 mg/dL (ref 70–99)
Potassium: 4.9 mmol/L (ref 3.5–5.2)
Sodium: 136 mmol/L (ref 134–144)
Total Protein: 7.2 g/dL (ref 6.0–8.5)
eGFR: 75 mL/min/{1.73_m2} (ref >59)

## 2023-02-12 ENCOUNTER — Other Ambulatory Visit: Payer: Self-pay

## 2023-02-12 DIAGNOSIS — I728 Aneurysm of other specified arteries: Secondary | ICD-10-CM

## 2023-02-21 ENCOUNTER — Ambulatory Visit: Payer: Medicare HMO | Admitting: Vascular Surgery

## 2023-02-21 ENCOUNTER — Ambulatory Visit (HOSPITAL_COMMUNITY)
Admission: RE | Admit: 2023-02-21 | Discharge: 2023-02-21 | Disposition: A | Discharge status: Home / Self Care | Payer: Medicare HMO | Source: Ambulatory Visit | Attending: Vascular Surgery | Admitting: Vascular Surgery

## 2023-02-21 ENCOUNTER — Encounter: Payer: Self-pay | Admitting: Vascular Surgery

## 2023-02-21 VITALS — BP 152/78 | HR 69 | Temp 97.8°F | Resp 18 | Ht 69.0 in | Wt 129.0 lb

## 2023-02-21 DIAGNOSIS — Q2549 Other congenital malformations of aorta: Secondary | ICD-10-CM | POA: Diagnosis not present

## 2023-02-21 DIAGNOSIS — I728 Aneurysm of other specified arteries: Secondary | ICD-10-CM | POA: Insufficient documentation

## 2023-02-21 DIAGNOSIS — Q278 Other specified congenital malformations of peripheral vascular system: Secondary | ICD-10-CM | POA: Diagnosis not present

## 2023-02-21 NOTE — Progress Notes (Signed)
Patient ID: Brian Dunn [employer], male   DOB: 1941-01-16, 82 y.o.   MRN: [id number]  Reason for Consult: New Patient (Initial Visit) (Incidental finding of right subclavian/aneurysn)   Referred by Revankar, Aundra Dubin, MD  Subjective:     HPI:  Brian Dunn is a 82 y.o. male without significant history of vascular disease does have a history of coronary artery disease.  In the past year he has had significant difficulty swallowing and has lost significant weight.  He is also had aspiration pneumonia and has undergone large workup for dysphagia without any explanation of underlying cause and they are very frustrated by this.  He is sent here for evaluation of aberrant right subclavian artery with aneurysm at the takeoff.  He has not had any chest pain or back pain has no upper extremity symptoms.  Past Medical History:  Diagnosis Date   Acute bronchitis 05/29/2012   Allergic rhinitis    Anemia in chronic kidney disease 02/27/2021   Angina pectoris 11/07/2020   Angina, class III 11/07/2020   Asthma 06/18/2012   Followed in Pulmonary clinic/ [facility]/ [place]  - 01/09/2018  After extensive coaching inhaler device  effectiveness =   75% > continue symb 160 2bid and return for pfts in 3 months ? RA bronchiolitis?  - PFT's  04/23/2018  FEV1 2.84 (91 % ) ratio 73  p no % improvement from saba p symb 160 prior to study with DLCO  76 % corrects to 79  % for alv volume   - 04/23/2018  After extensive coaching   Benign essential hypertension 06/20/2016   Bilateral inguinal hernia without obstruction or gangrene 06/20/2016   CAD (coronary artery disease), native coronary artery 06/20/2016   Chest pain, unspecified    Chronic ischemic heart disease, unspecified 01/28/2022   Chronic kidney disease 01/28/2022   COPD (chronic obstructive pulmonary disease)    Coronary artery disease involving native coronary artery of native heart with angina pectoris 06/20/2016   Cough 03/25/2012   Followed in  Pulmonary clinic/ [facility]/ [place]    - Trial off acei again  03/25/2012    - Sinus CT 01/24/11 There is mucosal thickening within the paranasal sinuses without  air-fluid levels. Neither ostiomeatal unit is patent.    Cough variant asthma 10/18/2010   Followed in Pulmonary clinic/ [facility]/ [place]  -HFA 75% p coaching 01/22/2011  -PFT's  02/04/2011 minimal airflow obst, nl dlco    Disorder of bone and cartilage 01/22/2007   Qualifier: Diagnosis of  By: Drue Novel MD, Nolon Rod.   Overview:  Overview:  Qualifier: Diagnosis of  By: Drue Novel MD, Jose E.   Dysphagia 12/27/2022   Onset ? Aug 2022 p febrile illness ? Asp pna   -  MBS 01/13/23   PO Diet Recommendation: Regular; Thin liquids (Level 0)  Liquid Administration via: Cup; Spoon; Straw  Supervision: Patient able to self-feed  Postural changes: -- (try reclining when sipping liquids)  Oral care recommendations: Oral care QID (4x/day); Oral care before ice   chips/water  Recommended consults: Consider ENT consultation   Encounter for fitting and adjustment of hearing aid 01/29/2022   Essential hypertension 11/06/2017   GERD 01/22/2007   Annotation: history of esophageal stricture Qualifier: Diagnosis of  By: Drue Novel MD, Jose E.    GERD (gastroesophageal reflux disease)    Gout 01/28/2022   Hip fracture    HIP FRACTURE 01/22/2007   Annotation: right-sided status post surgery Qualifier: Diagnosis of  By: Drue Novel  MD, Nolon Rod.    Hyperlipidemia 06/20/2016   Hyperlipidemia with target LDL less than 70 06/20/2016   Hypertension    HYPOGONADISM 02/18/2007   Qualifier: Diagnosis of  By: Drue Novel MD, Nolon Rod.    Hypogonadism male    Low back pain 09/09/2013   Multiple lung nodules on CT 11/21/2015   CT  10/17/15 mpns > 15 y since quit smoking > rec 12 m f/u as this is low risk > done 06/04/16 no change rec recheck in 55m  - CT [place] 12/08/17 no change nodules > meets benign criteria > no directed f/u - Quantiferon GOLD TB 01/09/18 neg    Osteopenia    Personal  history of malignant neoplasm of prostate    Postoperative visit 08/14/2016   Sensorineural hearing loss, bilateral 01/28/2022   Tremor, essential 02/18/2007   Overview:  Overview:  Qualifier: Diagnosis of  By: Drue Novel MD, Nolon Rod.  Last Assessment & Plan:  Reviewed tremor may increase on dulera so will need to be balanced against benefits to cough   Family History  Problem Relation Age of Onset   Parkinsonism Father    Asthma Father    Throat cancer Sister    Asthma Sister    Heart disease Mother    Heart disease Sister    Past Surgical History:  Procedure Laterality Date   CORONARY ANGIOPLASTY WITH STENT PLACEMENT  2008   CORONARY STENT INTERVENTION N/A 11/16/2020   Procedure: CORONARY STENT INTERVENTION;  Surgeon: Marykay Lex, MD;  Location[facility] INVASIVE CV LAB;  Service: Cardiovascular;  Laterality: N/A;   INSERTION PROSTATE RADIATION SEED     LEFT HEART CATH AND CORONARY ANGIOGRAPHY N/A 11/16/2020   Procedure: LEFT HEART CATH AND CORONARY ANGIOGRAPHY;  Surgeon: Marykay Lex, MD;  Location: [facility] INVASIVE CV LAB;  Service: Cardiovascular;  Laterality: N/A;    Short Social History:  Social History   Tobacco Use   Smoking status: Former    Packs/day: 1.00    Years: 35.00    Additional pack years: 0.00    Total pack years: 35.00    Types: Cigarettes    Quit date: 11/04/1996    Years since quitting: 26.3   Smokeless tobacco: Never  Substance Use Topics   Alcohol use: Not on file    Comment: ETOH daily    Allergies  Allergen Reactions   Metoprolol Other (See Comments)    Weight gain   Morphine     REACTION: rash   Penicillin G Swelling   Penicillins     REACTION: swelling    Current Outpatient Medications  Medication Sig Dispense Refill   albuterol (PROAIR HFA) 108 (90 BASE) MCG/ACT inhaler Inhale 2 puffs into the lungs every 6 (six) hours as needed for wheezing. 1 Inhaler 3   allopurinol (ZYLOPRIM) 100 MG tablet Take 1 tablet by mouth daily.      ascorbic acid  (VITAMIN C) 500 MG tablet Take 1 tablet by mouth daily.     aspirin EC 81 MG tablet Take 1 tablet (81 mg total) by mouth daily. 90 tablet 3   budesonide-formoterol (SYMBICORT) 160-4.5 MCG/ACT inhaler Inhale 2 puffs into the lungs daily.     Calcium Carbonate-Vitamin D (OYSTER SHELL CALCIUM/D) 500-5 MG-MCG TABS Take 1 tablet by mouth daily.     celecoxib (CELEBREX) 200 MG capsule Take 200 mg by mouth daily.      clopidogrel (PLAVIX) 75 MG tablet TAKE 1 TABLET BY MOUTH EVERY DAY WITH BREAKFAST 90 tablet  3   Coenzyme Q10 (CO Q-10) 75 MG CAPS Take 120 mg by mouth daily.     famotidine (PEPCID) 40 MG tablet Take 40 mg by mouth daily.     ferrous sulfate 325 (65 FE) MG tablet Take 1 tablet by mouth 2 (two) times daily.     fluticasone (FLONASE) 50 MCG/ACT nasal spray Place 2 sprays into the nose daily.     irbesartan (AVAPRO) 300 MG tablet Take 300 mg by mouth daily.      isosorbide mononitrate (IMDUR) 30 MG 24 hr tablet Take 0.5 tablets (15 mg total) by mouth daily. 15 tablet 1   loratadine (CLARITIN) 10 MG tablet Take 10 mg by mouth daily.      Magnesium 400 MG CAPS Take 400 mg by mouth 2 (two) times daily.     Multiple Vitamin (MULTIVITAMIN) capsule Take 1 capsule by mouth daily.     nitroGLYCERIN (NITROSTAT) 0.4 MG SL tablet Place 1 tablet (0.4 mg total) under the tongue every 5 (five) minutes as needed for chest pain. 25 tablet 11   pantoprazole (PROTONIX) 40 MG tablet Take 40 mg by mouth every morning.     pravastatin (PRAVACHOL) 40 MG tablet TAKE 1 TABLET BY MOUTH EVERY DAY 90 tablet 0   primidone (MYSOLINE) 50 MG tablet Take 50 mg by mouth at bedtime.      traMADol (ULTRAM) 50 MG tablet Take 50 mg by mouth 2 (two) times daily as needed for moderate pain.     vitamin B-12 (CYANOCOBALAMIN) 500 MCG tablet Take 500 mcg by mouth daily.     No current facility-administered medications for this visit.    Review of Systems  Constitutional: Positive for fatigue and unexpected weight change.   HENT: Positive for trouble swallowing.  Eyes: Eyes negative.  Respiratory: Respiratory negative.  GI: Gastrointestinal negative.  Musculoskeletal: Musculoskeletal negative.  Skin: Skin negative.  Neurological: Neurological negative. Hematologic: Hematologic/lymphatic negative.  Psychiatric: Psychiatric negative.        Objective:  Objective   Vitals:   02/21/23 1217 02/21/23 1219  BP: (!) 151/76 (!) 152/78  Pulse: 69 69  Resp: 18   Temp: 97.8 F (36.6 C)   TempSrc: Temporal   SpO2: 96%   Weight: 129 lb (58.5 kg)   Height:  (1.753 m)    Body mass index is 19.05 kg/m.  Physical Exam Constitutional:      Comments: underweight  HENT:     Head: Normocephalic.     Nose: Nose normal.     Mouth/Throat:     Mouth: Mucous membranes are moist.  Eyes:     Pupils: Pupils are equal, round, and reactive to light.  Neck:     Vascular: No carotid bruit.  Cardiovascular:     Rate and Rhythm: Normal rate.     Pulses: Normal pulses.     Heart sounds: No murmur heard. Abdominal:     General: Abdomen is flat.  Musculoskeletal:        General: Normal range of motion.     Cervical back: Normal range of motion and neck supple. No tenderness.     Right lower leg: No edema.     Left lower leg: No edema.  Skin:    General: Skin is warm.     Capillary Refill: Capillary refill takes less than 2 seconds.  Neurological:     General: No focal deficit present.     Mental Status: He is alert.  Psychiatric:  Mood and Affect: Mood normal.        Thought Content: Thought content normal.        Judgment: Judgment normal.    Data: Right Carotid Findings:  +----------+--------+--------+--------+------------------+--------+           PSV cm/sEDV cm/sStenosisPlaque DescriptionComments  +----------+--------+--------+--------+------------------+--------+  CCA Prox  71      9                                            +----------+--------+--------+--------+------------------+--------+  CCA Mid   88      13              heterogenous                +----------+--------+--------+--------+------------------+--------+  CCA Distal46      9               heterogenous                +----------+--------+--------+--------+------------------+--------+  ICA Prox  86      25      1-39%   heterogenous                +----------+--------+--------+--------+------------------+--------+  ICA Mid   79      18                                          +----------+--------+--------+--------+------------------+--------+  ICA Distal64      20                                          +----------+--------+--------+--------+------------------+--------+  ECA      205     16              heterogenous                +----------+--------+--------+--------+------------------+--------+   +----------+--------+-------+----------------+-------------------+           PSV cm/sEDV cmsDescribe        Arm Pressure (mmHG)  +----------+--------+-------+----------------+-------------------+  [id number]           Multiphasic, [id number]                  +----------+--------+-------+----------------+-------------------+   +---------+--------+--+--------+--+---------+  VertebralPSV cm/s47EDV cm/s10Antegrade  +---------+--------+--+--------+--+---------+      Left Carotid Findings:  +----------+--------+--------+--------+-------------------------+--------+           PSV cm/sEDV cm/sStenosisPlaque Description       Comments  +----------+--------+--------+--------+-------------------------+--------+  CCA Prox  81      13              heterogenous                       +----------+--------+--------+--------+-------------------------+--------+  CCA Mid   83      10              heterogenous                        +----------+--------+--------+--------+-------------------------+--------+  CCA Distal76      13              heterogenous                       +----------+--------+--------+--------+-------------------------+--------+  ICA Prox  79      17      1-39%   heterogenous and calcific          +----------+--------+--------+--------+-------------------------+--------+  ICA Mid   80      21                                                 +----------+--------+--------+--------+-------------------------+--------+  ICA Distal57      15                                                 +----------+--------+--------+--------+-------------------------+--------+  ECA      236     15      >50%    heterogenous                       +----------+--------+--------+--------+-------------------------+--------+   +----------+--------+--------+----------------+-------------------+           PSV cm/sEDV cm/sDescribe        Arm Pressure (mmHG)  +----------+--------+--------+----------------+-------------------+  Subclavian102    1       Multiphasic, [id number]                  +----------+--------+--------+----------------+-------------------+   +---------+--------+--+--------+-+---------+  VertebralPSV cm/s45EDV cm/s9Antegrade  +---------+--------+--+--------+-+---------+   Right Brachial index 72 cm/s triphasic waveform, Left Brachial index 54  cm/s triphasic waveform.      Summary:  Right Carotid: Velocities in the right ICA are consistent with a 1-39%  stenosis.   Left Carotid: Velocities in the left ICA are consistent with a 1-39%  stenosis.               The ECA appears >50% stenosed.   Vertebrals:  Bilateral vertebral arteries demonstrate antegrade flow.  Subclavians: Normal flow hemodynamics were seen in bilateral subclavian               arteries.               The bilateral subclavian arteries somewhat difficult to  visualize              due  to boney structures / body habitus, unable to visualized               subclavian artery aneurysm.       Assessment/Plan:    82 year old male with history of dysphagia and significant weight loss with a greater than 2 cm Kommerell diverticulum but by CT scan appears to be compressing the esophagus against the trachea.  I discussed with the patient and his family at bedside at this is very likely at least a contributing factor to his dysphagia given the large size of the diverticulum relative to the small area where it exist.  I recommended right subclavian artery transposition to the right common carotid artery and thoracic endovascular coverage of Kommerell diverticulum to relieve pressure from the diverticulum on the esophagus and hopefully improve his dysphagia symptoms.  All questions were answered and patient and family demonstrate good understanding.     Maeola Harman MD [facility]

## 2023-02-21 NOTE — H&P (View-Only) (Signed)
 Patient ID: Brian Dunn, male   DOB: 02/16/1941, 81 y.o.   MRN: [id number]  Reason for Consult: New Patient (Initial Visit) (Incidental finding of right subclavian/aneurysn)   Referred by Revankar, Rajan R, MD  Subjective:     HPI:  Brian Dunn is a 81 y.o. male without significant history of vascular disease does have a history of coronary artery disease.  In the past year he has had significant difficulty swallowing and has lost significant weight.  He is also had aspiration pneumonia and has undergone large workup for dysphagia without any explanation of underlying cause and they are very frustrated by this.  He is sent here for evaluation of aberrant right subclavian artery with aneurysm at the takeoff.  He has not had any chest pain or back pain has no upper extremity symptoms.  Past Medical History:  Diagnosis Date   Acute bronchitis 05/29/2012   Allergic rhinitis    Anemia in chronic kidney disease 02/27/2021   Angina pectoris 11/07/2020   Angina, class III 11/07/2020   Asthma 06/18/2012   Followed in Pulmonary clinic/ [facility]/ [place]  - 01/09/2018  After extensive coaching inhaler device  effectiveness =   75% > continue symb 160 2bid and return for pfts in 3 months ? RA bronchiolitis?  - PFT's  04/23/2018  FEV1 2.84 (91 % ) ratio 73  p no % improvement from saba p symb 160 prior to study with DLCO  76 % corrects to 79  % for alv volume   - 04/23/2018  After extensive coaching   Benign essential hypertension 06/20/2016   Bilateral inguinal hernia without obstruction or gangrene 06/20/2016   CAD (coronary artery disease), native coronary artery 06/20/2016   Chest pain, unspecified    Chronic ischemic heart disease, unspecified 01/28/2022   Chronic kidney disease 01/28/2022   COPD (chronic obstructive pulmonary disease)    Coronary artery disease involving native coronary artery of native heart with angina pectoris 06/20/2016   Cough 03/25/2012   Followed in  Pulmonary clinic/ [facility]/ [place]    - Trial off acei again  03/25/2012    - Sinus CT 01/24/11 There is mucosal thickening within the paranasal sinuses without  air-fluid levels. Neither ostiomeatal unit is patent.    Cough variant asthma 10/18/2010   [facility] [place]  -HFA 75% p coaching 01/22/2011  -PFT's  02/04/2011 minimal airflow obst, nl dlco    Disorder of bone and cartilage 01/22/2007   Qualifier: Diagnosis of  By: Paz MD, Jose E.   Overview:  Overview:  Qualifier: Diagnosis of  By: Paz MD, Jose E.   Dysphagia 12/27/2022   Onset ? Aug 2022 p febrile illness ? Asp pna   -  MBS 01/13/23   PO Diet Recommendation: Regular; Thin liquids (Level 0)  Liquid Administration via: Cup; Spoon; Straw  Supervision: Patient able to self-feed  Postural changes: -- (try reclining when sipping liquids)  Oral care recommendations: Oral care QID (4x/day); Oral care before ice   chips/water  Recommended consults: Consider ENT consultation   Encounter for fitting and adjustment of hearing aid 01/29/2022   Essential hypertension 11/06/2017   GERD 01/22/2007   Annotation: history of esophageal stricture Qualifier: Diagnosis of  By: Paz MD, Jose E.    GERD (gastroesophageal reflux disease)    Gout 01/28/2022   Hip fracture    HIP FRACTURE 01/22/2007   Annotation: right-sided status post surgery Qualifier: Diagnosis of  By: Paz   MD, Jose E.    Hyperlipidemia 06/20/2016   Hyperlipidemia with target LDL less than 70 06/20/2016   Hypertension    HYPOGONADISM 02/18/2007   Qualifier: Diagnosis of  By: Paz MD, Jose E.    Hypogonadism male    Low back pain 09/09/2013   Multiple lung nodules on CT 11/21/2015   CT [place] 10/17/15 mpns > 15 y since quit smoking > rec 12 m f/u as this is low risk > done 06/04/16 no change rec recheck in 18m  - CT [facility] 12/08/17 no change nodules > meets benign criteria > no directed f/u - Quantiferon GOLD TB 01/09/18 neg    Osteopenia    Personal  history of malignant neoplasm of prostate    Postoperative visit 08/14/2016   Sensorineural hearing loss, bilateral 01/28/2022   Tremor, essential 02/18/2007   Overview:  Overview:  Qualifier: Diagnosis of  By: Paz MD, Jose E.  Last Assessment & Plan:  Reviewed tremor may increase on dulera so will need to be balanced against benefits to cough   Family History  Problem Relation Age of Onset   Parkinsonism Father    Asthma Father    Throat cancer Sister    Asthma Sister    Heart disease Mother    Heart disease Sister    Past Surgical History:  Procedure Laterality Date   CORONARY ANGIOPLASTY WITH STENT PLACEMENT  2008   CORONARY STENT INTERVENTION N/A 11/16/2020   Procedure: CORONARY STENT INTERVENTION;  Surgeon: Harding, David W, MD;  Location: [facility];  Service: Cardiovascular;  Laterality: N/A;   INSERTION PROSTATE RADIATION SEED     LEFT HEART CATH AND CORONARY ANGIOGRAPHY N/A 11/16/2020   Procedure: LEFT HEART CATH AND CORONARY ANGIOGRAPHY;  Surgeon: Harding, David W, MD;  Location: [facility];  Service: Cardiovascular;  Laterality: N/A;    Short Social History:  Social History   Tobacco Use   Smoking status: Former    Packs/day: 1.00    Years: 35.00    Additional pack years: 0.00    Total pack years: 35.00    Types: Cigarettes    Quit date: 11/04/1996    Years since quitting: 26.3   Smokeless tobacco: Never  Substance Use Topics   Alcohol use: Not on file    Comment: ETOH daily    Allergies  Allergen Reactions   Metoprolol Other (See Comments)    Weight gain   Morphine     REACTION: rash   Penicillin G Swelling   Penicillins     REACTION: swelling    Current Outpatient Medications  Medication Sig Dispense Refill   albuterol (PROAIR HFA) 108 (90 BASE) MCG/ACT inhaler Inhale 2 puffs into the lungs every 6 (six) hours as needed for wheezing. 1 Inhaler 3   allopurinol (ZYLOPRIM) 100 MG tablet Take 1 tablet by mouth daily.      ascorbic acid  (VITAMIN C) 500 MG tablet Take 1 tablet by mouth daily.     aspirin EC 81 MG tablet Take 1 tablet (81 mg total) by mouth daily. 90 tablet 3   budesonide-formoterol (SYMBICORT) 160-4.5 MCG/ACT inhaler Inhale 2 puffs into the lungs daily.     Calcium Carbonate-Vitamin D (OYSTER SHELL CALCIUM/D) 500-5 MG-MCG TABS Take 1 tablet by mouth daily.     celecoxib (CELEBREX) 200 MG capsule Take 200 mg by mouth daily.      clopidogrel (PLAVIX) 75 MG tablet TAKE 1 TABLET BY MOUTH EVERY DAY WITH BREAKFAST 90 tablet   3   Coenzyme Q10 (CO Q-10) 75 MG CAPS Take 120 mg by mouth daily.     famotidine (PEPCID) 40 MG tablet Take 40 mg by mouth daily.     ferrous sulfate 325 (65 FE) MG tablet Take 1 tablet by mouth 2 (two) times daily.     fluticasone (FLONASE) 50 MCG/ACT nasal spray Place 2 sprays into the nose daily.     irbesartan (AVAPRO) 300 MG tablet Take 300 mg by mouth daily.      isosorbide mononitrate (IMDUR) 30 MG 24 hr tablet Take 0.5 tablets (15 mg total) by mouth daily. 15 tablet 1   loratadine (CLARITIN) 10 MG tablet Take 10 mg by mouth daily.      Magnesium 400 MG CAPS Take 400 mg by mouth 2 (two) times daily.     Multiple Vitamin (MULTIVITAMIN) capsule Take 1 capsule by mouth daily.     nitroGLYCERIN (NITROSTAT) 0.4 MG SL tablet Place 1 tablet (0.4 mg total) under the tongue every 5 (five) minutes as needed for chest pain. 25 tablet 11   pantoprazole (PROTONIX) 40 MG tablet Take 40 mg by mouth every morning.     pravastatin (PRAVACHOL) 40 MG tablet TAKE 1 TABLET BY MOUTH EVERY DAY 90 tablet 0   primidone (MYSOLINE) 50 MG tablet Take 50 mg by mouth at bedtime.      traMADol (ULTRAM) 50 MG tablet Take 50 mg by mouth 2 (two) times daily as needed for moderate pain.     vitamin B-12 (CYANOCOBALAMIN) 500 MCG tablet Take 500 mcg by mouth daily.     No current facility-administered medications for this visit.    Review of Systems  Constitutional: Positive for fatigue and unexpected weight change.   HENT: Positive for trouble swallowing.  Eyes: Eyes negative.  Respiratory: Respiratory negative.  GI: Gastrointestinal negative.  Musculoskeletal: Musculoskeletal negative.  Skin: Skin negative.  Neurological: Neurological negative. Hematologic: Hematologic/lymphatic negative.  Psychiatric: Psychiatric negative.        Objective:  Objective   Vitals:   02/21/23 1217 02/21/23 1219  BP: (!) 151/76 (!) 152/78  Pulse: 69 69  Resp: 18   Temp: 97.8 F (36.6 C)   TempSrc: Temporal   SpO2: 96%   Weight: 129 lb (58.5 kg)   Height: 5' 9" (1.753 m)    Body mass index is 19.05 kg/m.  Physical Exam Constitutional:      Comments: underweight  HENT:     Head: Normocephalic.     Nose: Nose normal.     Mouth/Throat:     Mouth: Mucous membranes are moist.  Eyes:     Pupils: Pupils are equal, round, and reactive to light.  Neck:     Vascular: No carotid bruit.  Cardiovascular:     Rate and Rhythm: Normal rate.     Pulses: Normal pulses.     Heart sounds: No murmur heard. Abdominal:     General: Abdomen is flat.  Musculoskeletal:        General: Normal range of motion.     Cervical back: Normal range of motion and neck supple. No tenderness.     Right lower leg: No edema.     Left lower leg: No edema.  Skin:    General: Skin is warm.     Capillary Refill: Capillary refill takes less than 2 seconds.  Neurological:     General: No focal deficit present.     Mental Status: He is alert.  Psychiatric:          Mood and Affect: Mood normal.        Thought Content: Thought content normal.        Judgment: Judgment normal.    Data: Right Carotid Findings:  +----------+--------+--------+--------+------------------+--------+           PSV cm/sEDV cm/sStenosisPlaque DescriptionComments  +----------+--------+--------+--------+------------------+--------+  CCA Prox  71      9                                            +----------+--------+--------+--------+------------------+--------+  CCA Mid   88      13              heterogenous                +----------+--------+--------+--------+------------------+--------+  CCA Distal46      9               heterogenous                +----------+--------+--------+--------+------------------+--------+  ICA Prox  86      25      1-39%   heterogenous                +----------+--------+--------+--------+------------------+--------+  ICA Mid   79      18                                          +----------+--------+--------+--------+------------------+--------+  ICA Distal64      20                                          +----------+--------+--------+--------+------------------+--------+  ECA      205     16              heterogenous                +----------+--------+--------+--------+------------------+--------+   +----------+--------+-------+----------------+-------------------+           PSV cm/sEDV cmsDescribe        Arm Pressure (mmHG)  +----------+--------+-------+----------------+-------------------+  Subclavian109           Multiphasic, WNL129                  +----------+--------+-------+----------------+-------------------+   +---------+--------+--+--------+--+---------+  VertebralPSV cm/s47EDV cm/s10Antegrade  +---------+--------+--+--------+--+---------+      Left Carotid Findings:  +----------+--------+--------+--------+-------------------------+--------+           PSV cm/sEDV cm/sStenosisPlaque Description       Comments  +----------+--------+--------+--------+-------------------------+--------+  CCA Prox  81      13              heterogenous                       +----------+--------+--------+--------+-------------------------+--------+  CCA Mid   83      10              heterogenous                        +----------+--------+--------+--------+-------------------------+--------+  CCA Distal76      13              heterogenous                       +----------+--------+--------+--------+-------------------------+--------+    ICA Prox  79      17      1-39%   heterogenous and calcific          +----------+--------+--------+--------+-------------------------+--------+  ICA Mid   80      21                                                 +----------+--------+--------+--------+-------------------------+--------+  ICA Distal57      15                                                 +----------+--------+--------+--------+-------------------------+--------+  ECA      236     15      >50%    heterogenous                       +----------+--------+--------+--------+-------------------------+--------+   +----------+--------+--------+----------------+-------------------+           PSV cm/sEDV cm/sDescribe        Arm Pressure (mmHG)  +----------+--------+--------+----------------+-------------------+  Subclavian102    1       Multiphasic, WNL131                  +----------+--------+--------+----------------+-------------------+   +---------+--------+--+--------+-+---------+  VertebralPSV cm/s45EDV cm/s9Antegrade  +---------+--------+--+--------+-+---------+   Right Brachial index 72 cm/s triphasic waveform, Left Brachial index 54  cm/s triphasic waveform.      Summary:  Right Carotid: Velocities in the right ICA are consistent with a 1-39%  stenosis.   Left Carotid: Velocities in the left ICA are consistent with a 1-39%  stenosis.               The ECA appears >50% stenosed.   Vertebrals:  Bilateral vertebral arteries demonstrate antegrade flow.  Subclavians: Normal flow hemodynamics were seen in bilateral subclavian               arteries.               The bilateral subclavian arteries somewhat difficult to  visualize              due  to boney structures / body habitus, unable to visualized               subclavian artery aneurysm.       Assessment/Plan:    81-year-old male with history of dysphagia and significant weight loss with a greater than 2 cm Kommerell diverticulum but by CT scan appears to be compressing the esophagus against the trachea.  I discussed with the patient and his family at bedside at this is very likely at least a contributing factor to his dysphagia given the large size of the diverticulum relative to the small area where it exist.  I recommended right subclavian artery transposition to the right common carotid artery and thoracic endovascular coverage of Kommerell diverticulum to relieve pressure from the diverticulum on the esophagus and hopefully improve his dysphagia symptoms.  All questions were answered and patient and family demonstrate good understanding.     Divinity Kyler Christopher Ngoc Detjen MD [facility]   

## 2023-02-24 ENCOUNTER — Telehealth: Payer: Self-pay | Admitting: *Deleted

## 2023-02-24 DIAGNOSIS — Z01810 Encounter for preprocedural cardiovascular examination: Secondary | ICD-10-CM

## 2023-02-24 DIAGNOSIS — I2510 Atherosclerotic heart disease of native coronary artery without angina pectoris: Secondary | ICD-10-CM

## 2023-02-24 NOTE — Telephone Encounter (Signed)
Dr. Tomie China, we have received request for right subclavian artery transposition and TEVAR on this pt recently seen by you on 02/11/23.  Could you please comment on cardiac risk assessment and agreement to hold dual antiplatelet therapy and send your response to p cv div preop?  Thank you, Marcelino Duster

## 2023-02-24 NOTE — Telephone Encounter (Signed)
Pt has appt 02/26/23 for pre op clearance. Looks like appt was made today 02/24/23 @ 1:48. I will update all parties involved.

## 2023-02-24 NOTE — Telephone Encounter (Signed)
-----   Message from Tarri Fuller, CMA sent at 02/24/2023  1:46 PM EDT ----- Regarding: PER DR. Tomie China PT NEEDS LEXI FOR PRE OP Hi everyone,   Hope everyone is having a good day. Dr. Tomie China is wanting this pt to have a lexiscan done before pre op clearance can be made. Can someone please help with scheduling. I will place the order if you tell what the order # is.   RIGHT SUBCLAVIAN ARTERY TRANSPOSITION & TEVAR   Thank you  Okey Regal

## 2023-02-24 NOTE — Telephone Encounter (Signed)
   Pre-operative Risk Assessment    Patient Name: Brian Dunn [facility]  DOB: 18-Mar-1941 MRN: [id number]      Request for Surgical Clearance    Procedure:   RIGHT SUBCLAVIAN ARTERY TRANSPOSITION & TEVAR  Date of Surgery:  Clearance TBD                                 Surgeon:  DR. Lemar Livings Surgeon's Group or Practice Name:  [facility] Phone number:  [phone] Fax number:  [phone]   Type of Clearance Requested:   - Medical  - Pharmacy:  Hold Aspirin and Clopidogrel (Plavix)     Type of Anesthesia:  Not Indicated (GENERAL?) LEFT MESSAGE TO CALL BACK AND CONFIRM ANESTHESIA TYPE TO BE USED.   Additional requests/questions:    [name]   02/24/2023, 11:59 AM

## 2023-02-24 NOTE — Telephone Encounter (Signed)
Please order nuclear stress test per Dr. Tomie China. Vascular surgery is requesting clearance ASAP, so please advise patient we would like to get this done this week.  Thank you, Marcelino Duster

## 2023-02-24 NOTE — Telephone Encounter (Signed)
I sent a staff message to [facility] Triage and as secure chat to Eleonore Chiquito, RN for Dr. Tomie China to see if they may be able to help get this pt scheduled for Lexiscan per the request of Dr. Tomie China.

## 2023-02-24 NOTE — Telephone Encounter (Signed)
Kea from Dr. Darcella Cheshire office did call back and did clarify will be general anesthesia. Also states the pt does not need to hold ASA, however they are requesting Plavix to be held x 5 days.

## 2023-02-25 ENCOUNTER — Encounter (HOSPITAL_COMMUNITY): Payer: Self-pay | Admitting: *Deleted

## 2023-02-25 ENCOUNTER — Other Ambulatory Visit: Payer: Self-pay

## 2023-02-26 ENCOUNTER — Ambulatory Visit: Payer: Medicare HMO | Attending: Cardiology | Admitting: Cardiology

## 2023-02-26 ENCOUNTER — Ambulatory Visit: Payer: Medicare HMO | Attending: Cardiology

## 2023-02-26 ENCOUNTER — Encounter: Payer: Self-pay | Admitting: Cardiology

## 2023-02-26 VITALS — BP 144/86 | HR 70 | Ht 69.0 in | Wt 135.0 lb

## 2023-02-26 DIAGNOSIS — I2510 Atherosclerotic heart disease of native coronary artery without angina pectoris: Secondary | ICD-10-CM

## 2023-02-26 DIAGNOSIS — I728 Aneurysm of other specified arteries: Secondary | ICD-10-CM

## 2023-02-26 DIAGNOSIS — I10 Essential (primary) hypertension: Secondary | ICD-10-CM | POA: Diagnosis not present

## 2023-02-26 DIAGNOSIS — Z01810 Encounter for preprocedural cardiovascular examination: Secondary | ICD-10-CM

## 2023-02-26 DIAGNOSIS — E785 Hyperlipidemia, unspecified: Secondary | ICD-10-CM

## 2023-02-26 HISTORY — DX: Encounter for preprocedural cardiovascular examination: Z01.810

## 2023-02-26 LAB — MYOCARDIAL PERFUSION IMAGING
LV dias vol: 128 mL (ref 62–150)
LV sys vol: 45 mL
Nuc Stress EF: 65 %
Peak HR: 83 {beats}/min
Rest HR: 70 {beats}/min
Rest Nuclear Isotope Dose: 10.9 mCi
SDS: 1
SRS: 2
SSS: 3
Stress Nuclear Isotope Dose: 32.7 mCi
TID: 1.07

## 2023-02-26 MED ORDER — TECHNETIUM TC 99M TETROFOSMIN IV KIT
10.9000 | PACK | Freq: Once | INTRAVENOUS | Status: AC | PRN
  Administered 2023-02-26: 10.9 via INTRAVENOUS

## 2023-02-26 MED ORDER — REGADENOSON 0.4 MG/5ML IV SOLN
0.4000 mg | Freq: Once | INTRAVENOUS | Status: AC
  Administered 2023-02-26: 0.4 mg via INTRAVENOUS

## 2023-02-26 MED ORDER — TECHNETIUM TC 99M TETROFOSMIN IV KIT
32.7000 | PACK | Freq: Once | INTRAVENOUS | Status: AC | PRN
  Administered 2023-02-26: 32.7 via INTRAVENOUS

## 2023-02-26 NOTE — Progress Notes (Signed)
Cardiology Office Note:    Date:  02/26/2023   ID:  Brian Dunn, DOB 1941/09/29, MRN [id number]  PCP:  Noni Saupe, MD  Cardiologist:  Garwin Brothers, MD   Referring MD: Vicki Mallet*    ASSESSMENT:    1. Aneurysm of right subclavian artery   2. Benign essential hypertension   3. Coronary artery disease involving native coronary artery of native heart without angina pectoris   4. Hyperlipidemia with target LDL less than 70   5. Preop cardiovascular exam    PLAN:    In order of problems listed above:  Coronary artery disease: Secondary prevention stressed to the patient.  Importance of compliance with diet medications mentioned and he vocalized understanding. Preoperative restratification for subclavian artery aneurysm resection: He had a stress test today and this did not reveal any evidence of ischemia and ejection fraction is preserved.  In view of this he is not at high risk for complications during the above-mentioned surgery.  Meticulous hemodynamic monitoring will further reduce the risk of coronary events. Essential hypertension: Blood pressure is stable diet was emphasized.  He has an element of whitecoat hypertension and is under a little bit of stress in view of his current situation. Mixed dyslipidemia: On lipid-lowering medications.  I reviewed lipids and discussed with him. Patient will be seen in follow-up appointment in 6 months or earlier if the patient has any concerns.    Medication Adjustments/Labs and Tests Ordered: Current medicines are reviewed at length with the patient today.  Concerns regarding medicines are outlined above.  No orders of the defined types were placed in this encounter.  No orders of the defined types were placed in this encounter.    No chief complaint on file.    History of Present Illness:    Brian Dunn is a 82 y.o. male.  Patient is past medical history of coronary artery disease, essential  hypertension, mixed dyslipidemia.  He was evaluated and found to have subclavian artery aneurysm and recommended surgery.  He mentions to me that this has given him significant problems with swallowing.  Or vascular colleague feels that this could be better after aneurysm resection surgery.  At the time of my evaluation, the patient is alert awake oriented and in no distress.  Past Medical History:  Diagnosis Date   Acute bronchitis 05/29/2012   Allergic rhinitis    Anemia in chronic kidney disease 02/27/2021   Aneurysm of right subclavian artery 02/07/2023   Angina pectoris 11/07/2020   Angina, class III 11/07/2020   Asthma 06/18/2012   Followed in Pulmonary clinic/ [facility]/ [place]  - 01/09/2018  After extensive coaching inhaler device  effectiveness =   75% > continue symb 160 2bid and return for pfts in 3 months ? RA bronchiolitis?  - PFT's  04/23/2018  FEV1 2.84 (91 % ) ratio 73  p no % improvement from saba p symb 160 prior to study with DLCO  76 % corrects to 79  % for alv volume   - 04/23/2018  After extensive coaching   Benign essential hypertension 06/20/2016   Bilateral inguinal hernia without obstruction or gangrene 06/20/2016   CAD (coronary artery disease), native coronary artery 06/20/2016   Chest pain, unspecified    Chronic ischemic heart disease, unspecified 01/28/2022   Chronic kidney disease 01/28/2022   COPD (chronic obstructive pulmonary disease)    Coronary artery disease involving native coronary artery of native heart with angina pectoris 06/20/2016  Cough 03/25/2012   Followed in Pulmonary clinic/ [facility]/ [place]    - Trial off acei again  03/25/2012    - Sinus CT 01/24/11 There is mucosal thickening within the paranasal sinuses without  air-fluid levels. Neither ostiomeatal unit is patent.    Cough variant asthma 10/18/2010   Followed in Pulmonary clinic/ [facility]/ [place]  -HFA 75% p coaching 01/22/2011  -PFT's  02/04/2011 minimal airflow obst, nl  dlco    Disorder of bone and cartilage 01/22/2007   Qualifier: Diagnosis of  By: Drue Novel MD, Nolon Rod.   Overview:  Overview:  Qualifier: Diagnosis of  By: Drue Novel MD, Jose E.   Dysphagia 12/27/2022   Onset ? Aug 2022 p febrile illness ? Asp pna   -  MBS 01/13/23   PO Diet Recommendation: Regular; Thin liquids (Level 0)  Liquid Administration via: Cup; Spoon; Straw  Supervision: Patient able to self-feed  Postural changes: -- (try reclining when sipping liquids)  Oral care recommendations: Oral care QID (4x/day); Oral care before ice   chips/water  Recommended consults: Consider ENT consultation   Encounter for fitting and adjustment of hearing aid 01/29/2022   Essential hypertension 11/06/2017   GERD 01/22/2007   Annotation: history of esophageal stricture Qualifier: Diagnosis of  By: Drue Novel MD, Nolon Rod.    GERD (gastroesophageal reflux disease)    Gout 01/28/2022   Hip fracture    HIP FRACTURE 01/22/2007   Annotation: right-sided status post surgery Qualifier: Diagnosis of  By: Drue Novel MD, Nolon Rod.    Hyperlipidemia 06/20/2016   Hyperlipidemia with target LDL less than 70 06/20/2016   Hypertension    HYPOGONADISM 02/18/2007   Qualifier: Diagnosis of  By: Drue Novel MD, Nolon Rod.    Hypogonadism male    Low back pain 09/09/2013   Multiple lung nodules on CT 11/21/2015   CT [place] 10/17/15 mpns > 15 y since quit smoking > rec 12 m f/u as this is low risk > done 06/04/16 no change rec recheck in 76m  - CT [place] 12/08/17 no change nodules > meets benign criteria > no directed f/u - Quantiferon GOLD TB 01/09/18 neg    Osteopenia    Personal history of malignant neoplasm of prostate    Postoperative visit 08/14/2016   Sensorineural hearing loss, bilateral 01/28/2022   Tremor, essential 02/18/2007   Overview:  Overview:  Qualifier: Diagnosis of  By: Drue Novel MD, Nolon Rod.  Last Assessment & Plan:  Reviewed tremor may increase on dulera so will need to be balanced against benefits to cough    Past Surgical History:  Procedure  Laterality Date   CORONARY ANGIOPLASTY WITH STENT PLACEMENT  2008   CORONARY STENT INTERVENTION N/A 11/16/2020   Procedure: CORONARY STENT INTERVENTION;  Surgeon: Marykay Lex, MD;  Location: [facility]VAS[facility] CV LAB;  Service: Cardiovascular;  Laterality: N/A;   INSERTION PROSTATE RADIATION SEED     LEFT HEART CATH AND CORONARY ANGIOGRAPHY N/A 11/16/2020   Procedure: LEFT HEART CATH AND CORONARY ANGIOGRAPHY;  Surgeon: Marykay Lex, MD;  Location: [facility] INVASIVE CV LAB;  Service: Cardiovascular;  Laterality: N/A;    Current Medications: Current Meds  Medication Sig   albuterol (PROAIR HFA) 108 (90 BASE) MCG/ACT inhaler Inhale 2 puffs into the lungs every 6 (six) hours as needed for wheezing.   allopurinol (ZYLOPRIM) 100 MG tablet Take 1 tablet by mouth daily.    ascorbic acid (VITAMIN C) 500 MG tablet Take 1 tablet by mouth daily.   aspirin EC  81 MG tablet Take 1 tablet (81 mg total) by mouth daily.   budesonide-formoterol (SYMBICORT) 160-4.5 MCG/ACT inhaler Inhale 2 puffs into the lungs daily.   Calcium Carbonate-Vitamin D (OYSTER SHELL CALCIUM/D) 500-5 MG-MCG TABS Take 1 tablet by mouth daily.   celecoxib (CELEBREX) 200 MG capsule Take 200 mg by mouth daily.    clopidogrel (PLAVIX) 75 MG tablet TAKE 1 TABLET BY MOUTH EVERY DAY WITH BREAKFAST   Coenzyme Q10 (CO Q-10) 75 MG CAPS Take 120 mg by mouth daily.   famotidine (PEPCID) 40 MG tablet Take 40 mg by mouth daily.   ferrous sulfate 325 (65 FE) MG tablet Take 1 tablet by mouth 2 (two) times daily.   fluticasone (FLONASE) 50 MCG/ACT nasal spray Place 2 sprays into the nose daily.   irbesartan (AVAPRO) 300 MG tablet Take 300 mg by mouth daily.    isosorbide mononitrate (IMDUR) 30 MG 24 hr tablet Take 0.5 tablets (15 mg total) by mouth daily.   loratadine (CLARITIN) 10 MG tablet Take 10 mg by mouth daily.    Magnesium 400 MG CAPS Take 400 mg by mouth 2 (two) times daily.   Multiple Vitamin (MULTIVITAMIN) capsule Take 1 capsule by mouth daily.    nitroGLYCERIN (NITROSTAT) 0.4 MG SL tablet Place 1 tablet (0.4 mg total) under the tongue every 5 (five) minutes as needed for chest pain.   pantoprazole (PROTONIX) 40 MG tablet Take 40 mg by mouth every morning.   pravastatin (PRAVACHOL) 40 MG tablet TAKE 1 TABLET BY MOUTH EVERY DAY   primidone (MYSOLINE) 50 MG tablet Take 50 mg by mouth at bedtime.    tamsulosin (FLOMAX) 0.4 MG CAPS capsule Take 0.4 mg by mouth daily.   traMADol (ULTRAM) 50 MG tablet Take 50 mg by mouth 2 (two) times daily as needed for moderate pain.   vitamin B-12 (CYANOCOBALAMIN) 500 MCG tablet Take 500 mcg by mouth daily.     Allergies:   Metoprolol, Morphine, Penicillin g, and Penicillins   Social History   Socioeconomic History   Marital status: Married    Spouse name: Not on file   Number of children: Not on file   Years of education: Not on file   Highest education level: Not on file  Occupational History   Not on file  Tobacco Use   Smoking status: Former    Packs/day: 1.00    Years: 35.00    Additional pack years: 0.00    Total pack years: 35.00    Types: Cigarettes    Quit date: 11/04/1996    Years since quitting: 26.3   Smokeless tobacco: Never  Substance and Sexual Activity   Alcohol use: Not on file    Comment: ETOH daily   Drug use: Yes   Sexual activity: Not on file  Other Topics Concern   Not on file  Social History Narrative   Married and has children.  Works in Air[employer].   Social Determinants of Health   Financial Resource Strain: Not on file  Food Insecurity: Not on file  Transportation Needs: Not on file  Physical Activity: Not on file  Stress: Not on file  Social Connections: Not on file     Family History: The patient's family history includes Asthma in his father and sister; Heart disease in his mother and sister; Parkinsonism in his father; Throat cancer in his sister.  ROS:   Please see the history of present illness.    All other systems reviewed and are  negative.  EKGs/Labs/Other Studies Reviewed:    The following studies were reviewed today: I discussed my findings with the patient at length.  Nuclear test was unremarkable and did not reveal any evidence of ischemia.   Recent Labs: 12/20/2022: Hemoglobin 11.1; Platelets 221 02/07/2023: ALT 20; BUN 27; Creatinine, Ser 1.00; Potassium 4.9; Sodium 136  Recent Lipid Panel    Component Value Date/Time   CHOL 144 02/07/2023 1007   TRIG 59 02/07/2023 1007   HDL 74 02/07/2023 1007   CHOLHDL 1.9 02/07/2023 1007   CHOLHDL 2.5 CALC 01/22/2007 0923   VLDL 19 01/22/2007 0923   LDLCALC 58 02/07/2023 1007   LDLDIRECT 117.9 01/22/2007 0923    Physical Exam:    VS:  BP (!) 144/86   Pulse 70   Ht 5\' 9"  (1.753 m)   Wt 135 lb (61.2 kg)   SpO2 96%   BMI 19.94 kg/m     Wt Readings from Last 3 Encounters:  02/26/23 135 lb (61.2 kg)  02/21/23 129 lb (58.5 kg)  02/07/23 132 lb 6.4 oz (60.1 kg)     GEN: Patient is in no acute distress HEENT: Normal NECK: No JVD; No carotid bruits LYMPHATICS: No lymphadenopathy CARDIAC: Hear sounds regular, 2/6 systolic murmur at the apex. RESPIRATORY:  Clear to auscultation without rales, wheezing or rhonchi  ABDOMEN: Soft, non-tender, non-distended MUSCULOSKELETAL:  No edema; No deformity  SKIN: Warm and dry NEUROLOGIC:  Alert and oriented x 3 PSYCHIATRIC:  Normal affect   Signed, Garwin Brothers, MD  02/26/2023 1:48 PM    [facility]

## 2023-02-26 NOTE — Patient Instructions (Signed)
Medication Instructions:  Your physician recommends that you continue on your current medications as directed. Please refer to the Current Medication list given to you today.  *If you need a refill on your cardiac medications before your next appointment, please call your pharmacy*   Lab Work: None ordered If you have labs (blood work) drawn today and your tests are completely normal, you will receive your results only by: MyChart Message (if you have MyChart) OR A paper copy in the mail If you have any lab test that is abnormal or we need to change your treatment, we will call you to review the results.   Testing/Procedures: None ordered   Follow-Up: At [facility], you and your health needs are our priority.  As part of our continuing mission to provide you with exceptional heart care, we have created designated Provider Care Teams.  These Care Teams include your primary Cardiologist (physician) and Advanced Practice Providers (APPs -  Physician Assistants and Nurse Practitioners) who all work together to provide you with the care you need, when you need it.  We recommend signing up for the patient portal called "MyChart".  Sign up information is provided on this After Visit Summary.  MyChart is used to connect with patients for Virtual Visits (Telemedicine).  Patients are able to view lab/test results, encounter notes, upcoming appointments, etc.  Non-urgent messages can be sent to your provider as well.   To learn more about what you can do with MyChart, go to [email or web address].    Your next appointment:   6 month(s)  The format for your next appointment:   In Person  Provider:   Rajan Revankar, MD    Other Instructions none  Important Information About Sugar       

## 2023-03-01 ENCOUNTER — Other Ambulatory Visit: Payer: Self-pay | Admitting: Cardiology

## 2023-03-03 ENCOUNTER — Other Ambulatory Visit: Payer: Self-pay

## 2023-03-03 DIAGNOSIS — Q2549 Other congenital malformations of aorta: Secondary | ICD-10-CM

## 2023-03-04 DIAGNOSIS — M8580 Other specified disorders of bone density and structure, unspecified site: Secondary | ICD-10-CM | POA: Diagnosis not present

## 2023-03-04 DIAGNOSIS — Z681 Body mass index (BMI) 19 or less, adult: Secondary | ICD-10-CM | POA: Diagnosis not present

## 2023-03-04 DIAGNOSIS — Z1331 Encounter for screening for depression: Secondary | ICD-10-CM | POA: Diagnosis not present

## 2023-03-04 DIAGNOSIS — Z0000 Encounter for general adult medical examination without abnormal findings: Secondary | ICD-10-CM | POA: Diagnosis not present

## 2023-03-04 DIAGNOSIS — Z1339 Encounter for screening examination for other mental health and behavioral disorders: Secondary | ICD-10-CM | POA: Diagnosis not present

## 2023-03-04 DIAGNOSIS — Z79899 Other long term (current) drug therapy: Secondary | ICD-10-CM | POA: Diagnosis not present

## 2023-03-04 DIAGNOSIS — M069 Rheumatoid arthritis, unspecified: Secondary | ICD-10-CM | POA: Diagnosis not present

## 2023-03-06 NOTE — Progress Notes (Signed)
Surgical Instructions    Your procedure is scheduled on Tuesday, May 7.  Report to [facility] Main Entrance "A" at 5:30 A.M., then check in with the Admitting office.  Call this number if you have problems the morning of surgery:  [phone]   If you have any questions prior to your surgery date call [phone]: Open Monday-Friday 8am-4pm If you experience any cold or flu symptoms such as cough, fever, chills, shortness of breath, etc. between now and your scheduled surgery, please notify us at the above number     Remember:  Do not eat  or drink after midnight the night before your surgery     Take these medicines the morning of surgery with A SIP OF WATER:  allopurinol (ZYLOPRIM)  budesonide-formoterol (SYMBICORT)  famotidine (PEPCID)  fluticasone (FLONASE) 50 MCG/ACT nasal spray  isosorbide mononitrate (IMDUR)  loratadine (CLARITIN)  pantoprazole (PROTONIX)  pravastatin (PRAVACHOL)  tamsulosin (FLOMAX)   If needed: albuterol (PROAIR HFA) 108 (90 BASE) MCG/ACT inhaler  traMADol (ULTRAM)   Follow your surgeon's instructions regarding Aspirin and clopidogrel (Plavix).  If no instructions were given by your surgeon then you will need to call the office to get those instructions.    As of today, STOP taking any celecoxib (CELEBREX), Aleve, Naproxen, Ibuprofen, Motrin, Advil, Goody's, BC's, all herbal medications, fish oil, and all vitamins.            [facility] is not responsible for any belongings or valuables.    Do NOT Smoke (Tobacco/Vaping)  24 hours prior to your procedure  If you use a CPAP at night, you may bring your mask for your overnight stay.   Contacts, glasses, hearing aids, dentures or partials may not be worn into surgery, please bring cases for these belongings   For patients admitted to the hospital, discharge time will be determined by your treatment team.   Patients discharged the day of surgery will not be allowed to drive home, and someone  needs to stay with them for 24 hours.   SURGICAL WAITING ROOM VISITATION Patients having surgery or a procedure may have no more than 2 support people in the waiting area - these visitors may rotate.   Children under the age of 57 must have an adult with them who is not the patient. If the patient needs to stay at the hospital during part of their recovery, the visitor guidelines for inpatient rooms apply. Pre-op nurse will coordinate an appropriate time for 1 support person to accompany patient in pre-op.  This support person may not rotate.   Please refer to [email or web address] for the visitor guidelines for Inpatients (after your surgery is over and you are in a regular room).    Special instructions:    Oral Hygiene is also important to reduce your risk of infection.  Remember - BRUSH YOUR TEETH THE MORNING OF SURGERY WITH YOUR REGULAR TOOTHPASTE   [facility]- Preparing For Surgery  Before surgery, you can play an important role. Because skin is not sterile, your skin needs to be as free of germs as possible. You can reduce the number of germs on your skin by washing with CHG (chlorahexidine gluconate) Soap before surgery.  CHG is an antiseptic cleaner which kills germs and bonds with the skin to continue killing germs even after washing.     Please do not use if you have an allergy to CHG or antibacterial soaps. If your skin becomes reddened/irritated stop using the CHG.  Do not shave (  including legs and underarms) for at least 48 hours prior to first CHG shower. It is OK to shave your face.  Please follow these instructions carefully.     Shower the NIGHT BEFORE SURGERY and the MORNING OF SURGERY with CHG Soap.   If you chose to wash your hair, wash your hair first as usual with your normal shampoo. After you shampoo, rinse your hair and body thoroughly to remove the shampoo.  Then [employer] and genitals (private parts) with your  normal soap and rinse thoroughly to remove soap.  After that Use CHG Soap as you would any other liquid soap. You can apply CHG directly to the skin and wash gently with a scrungie or a clean washcloth.   Apply the CHG Soap to your body ONLY FROM THE NECK DOWN.  Do not use on open wounds or open sores. Avoid contact with your eyes, ears, mouth and genitals (private parts). Wash Face and genitals (private parts)  with your normal soap.   Wash thoroughly, paying special attention to the area where your surgery will be performed.  Thoroughly rinse your body with warm water from the neck down.  DO NOT shower/wash with your normal soap after using and rinsing off the CHG Soap.  Pat yourself dry with a CLEAN TOWEL.  Wear CLEAN PAJAMAS to bed the night before surgery  Place CLEAN SHEETS on your bed the night before your surgery  DO NOT SLEEP WITH PETS.   Day of Surgery:  Take a shower with CHG soap. Wear Clean/Comfortable clothing the morning of surgery Do not wear jewelry or makeup. Do not wear lotions, powders, perfumes/cologne or deodorant. Do not shave 48 hours prior to surgery.  Men may shave face and neck. Do not bring valuables to the hospital. Do not wear nail polish, gel polish, artificial nails, or any other type of covering on natural nails (fingers and toes) If you have artificial nails or gel coating that need to be removed by a nail salon, please have this removed prior to surgery. Artificial nails or gel coating may interfere with anesthesia's ability to adequately monitor your vital signs. Remember to brush your teeth WITH YOUR REGULAR TOOTHPASTE.    If you received a COVID test during your pre-op visit, it is requested that you wear a mask when out in public, stay away from anyone that may not be feeling well, and notify your surgeon if you develop symptoms. If you have been in contact with anyone that has tested positive in the last 10 days, please notify your  surgeon.    Please read over the following fact sheets that you were given.

## 2023-03-07 ENCOUNTER — Encounter (HOSPITAL_COMMUNITY)
Admission: RE | Admit: 2023-03-07 | Discharge: 2023-03-07 | Disposition: A | Discharge status: Home / Self Care | Payer: Medicare HMO | Source: Ambulatory Visit | Attending: Vascular Surgery | Admitting: Vascular Surgery

## 2023-03-07 ENCOUNTER — Encounter (HOSPITAL_COMMUNITY): Payer: Self-pay

## 2023-03-07 ENCOUNTER — Other Ambulatory Visit: Payer: Self-pay

## 2023-03-07 VITALS — BP 137/78 | HR 69 | Temp 98.3°F | Resp 18 | Ht 69.0 in | Wt 131.9 lb

## 2023-03-07 DIAGNOSIS — Q2549 Other congenital malformations of aorta: Secondary | ICD-10-CM | POA: Insufficient documentation

## 2023-03-07 DIAGNOSIS — I2510 Atherosclerotic heart disease of native coronary artery without angina pectoris: Secondary | ICD-10-CM | POA: Insufficient documentation

## 2023-03-07 DIAGNOSIS — E785 Hyperlipidemia, unspecified: Secondary | ICD-10-CM | POA: Insufficient documentation

## 2023-03-07 DIAGNOSIS — Z01818 Encounter for other preprocedural examination: Secondary | ICD-10-CM

## 2023-03-07 DIAGNOSIS — I10 Essential (primary) hypertension: Secondary | ICD-10-CM | POA: Insufficient documentation

## 2023-03-07 DIAGNOSIS — R0602 Shortness of breath: Secondary | ICD-10-CM | POA: Insufficient documentation

## 2023-03-07 DIAGNOSIS — Z01812 Encounter for preprocedural laboratory examination: Secondary | ICD-10-CM | POA: Diagnosis not present

## 2023-03-07 LAB — URINALYSIS, ROUTINE W REFLEX MICROSCOPIC
Bacteria, UA: NONE SEEN
Glucose, UA: NEGATIVE mg/dL
Hgb urine dipstick: NEGATIVE
Ketones, ur: 5 mg/dL — AB
Leukocytes,Ua: NEGATIVE
Nitrite: NEGATIVE
Protein, ur: 30 mg/dL — AB
Specific Gravity, Urine: 1.024 (ref 1.005–1.030)
pH: 5 (ref 5.0–8.0)

## 2023-03-07 LAB — COMPREHENSIVE METABOLIC PANEL
ALT: 21 U/L (ref 0–44)
AST: 27 U/L (ref 15–41)
Albumin: 3.6 g/dL (ref 3.5–5.0)
Alkaline Phosphatase: 55 U/L (ref 38–126)
Anion gap: 8 (ref 5–15)
BUN: 27 mg/dL — ABNORMAL HIGH (ref 8–23)
CO2: 25 mmol/L (ref 22–32)
Calcium: 9.1 mg/dL (ref 8.9–10.3)
Chloride: 101 mmol/L (ref 98–111)
Creatinine, Ser: 1.22 mg/dL (ref 0.61–1.24)
GFR, Estimated: 59 mL/min — ABNORMAL LOW (ref >60)
Glucose, Bld: 86 mg/dL (ref 70–99)
Potassium: 4.9 mmol/L (ref 3.5–5.1)
Sodium: 134 mmol/L — ABNORMAL LOW (ref 135–145)
Total Bilirubin: 0.4 mg/dL (ref 0.3–1.2)
Total Protein: 7.4 g/dL (ref 6.5–8.1)

## 2023-03-07 LAB — CBC
HCT: 32.4 % — ABNORMAL LOW (ref 39.0–52.0)
Hemoglobin: 10.9 g/dL — ABNORMAL LOW (ref 13.0–17.0)
MCH: 32.9 pg (ref 26.0–34.0)
MCHC: 33.6 g/dL (ref 30.0–36.0)
MCV: 97.9 fL (ref 80.0–100.0)
Platelets: 229 10*3/uL (ref 150–400)
RBC: 3.31 MIL/uL — ABNORMAL LOW (ref 4.22–5.81)
RDW: 13.4 % (ref 11.5–15.5)
WBC: 7 10*3/uL (ref 4.0–10.5)
nRBC: 0 % (ref 0.0–0.2)

## 2023-03-07 LAB — TYPE AND SCREEN
ABO/RH(D): O POS
Antibody Screen: NEGATIVE

## 2023-03-07 LAB — SURGICAL PCR SCREEN
MRSA, PCR: NEGATIVE
Staphylococcus aureus: NEGATIVE

## 2023-03-07 LAB — PROTIME-INR
INR: 1.1 (ref 0.8–1.2)
Prothrombin Time: 14.2 seconds (ref 11.4–15.2)

## 2023-03-07 LAB — APTT: aPTT: 32 seconds (ref 24–36)

## 2023-03-07 NOTE — Progress Notes (Addendum)
PCP - Dr. Gwendlyn Deutscher II @ [facility] Cardiologist - Dr. Josiah Lobo  PPM/ICD - Denies  Chest x-ray -12/30/22  EKG - 02/07/23 Stress Test - 02/26/23 ECHO - Yes at [facility]  Cardiac Cath - 11/06/20  Sleep Study - No OSA  DM - Denies  Blood Thinner Instructions: N/A Aspirin Instructions:Will remain on Aspirin per Dr. Randie Heinz  COVID TEST- N/A   Anesthesia review Yes cardiac hx   Patient denies shortness of breath, fever, cough and chest pain at PAT appointment   All instructions explained to the patient, with a verbal understanding of the material. Patient agrees to go over the instructions while at home for a better understanding.  The opportunity to ask questions was provided.

## 2023-03-10 NOTE — Anesthesia Preprocedure Evaluation (Signed)
Anesthesia Evaluation  Patient identified by MRN, date of birth, ID band Patient awake    Reviewed: Allergy & Precautions, H&P , NPO status , Patient's Chart, lab work & pertinent test results  Airway Mallampati: II   Neck ROM: full    Dental   Pulmonary asthma , COPD, former smoker   breath sounds clear to auscultation       Cardiovascular hypertension, + angina  + CAD and + Peripheral Vascular Disease   Rhythm:regular Rate:Normal  Stress test (02/26/23): normal LVEF, no ischemia.  Cardiology clearance in EPIC   Neuro/Psych    GI/Hepatic ,GERD  ,,  Endo/Other    Renal/GU      Musculoskeletal   Abdominal   Peds  Hematology  (+) Blood dyscrasia, anemia   Anesthesia Other Findings   Reproductive/Obstetrics                             Anesthesia Physical Anesthesia Plan  ASA: 3  Anesthesia Plan: General   Post-op Pain Management:    Induction: Intravenous  PONV Risk Score and Plan: 2 and Ondansetron, Dexamethasone and Treatment may vary due to age or medical condition  Airway Management Planned: Oral ETT  Additional Equipment: Arterial line  Intra-op Plan:   Post-operative Plan: Extubation in OR  Informed Consent: I have reviewed the patients History and Physical, chart, labs and discussed the procedure including the risks, benefits and alternatives for the proposed anesthesia with the patient or authorized representative who has indicated his/her understanding and acceptance.     Dental advisory given  Plan Discussed with: CRNA, Anesthesiologist and Surgeon  Anesthesia Plan Comments: (PAT note by Antionette Poles, PA-C: Follows cardiology for history of HTN, HLD, CAD s/p DES x 2 to LCx.  Seen by Dr. Tomie China 02/26/2023 for preop evaluation.  Per note, "Preoperative restratification for subclavian artery aneurysm resection: He had a stress test today and this did not reveal any  evidence of ischemia and ejection fraction is preserved.  In view of this he is not at high risk for complications during the above-mentioned surgery.  Meticulous hemodynamic monitoring will further reduce the risk of coronary events."  Follows with pulmonology for history of cough variant asthma and severe dysphagia.  Former smoker, 35 pack years, quit 1998.  Last seen by Dr. Sherene Sires 12/27/2022.  Per note, "Experiencing rapid geriactric decline in setting of severe dysphagia/ wt loss so nothing to offer from pulmonary perspective if can't improve his nutritional status and refuses tube feeding / advised. Pulmonary f/u for worse sob/ cough / pleuritic cp prn "  Patient has had extensive workup for dysphagia.  Ultimately was evaluated by vascular surgeon Dr. 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  He recommended right subclavian artery transposition to the right common carotid artery and thoracic endovascular coverage of, diverticulum to relieve pressure on the esophagus.  Preop labs reviewed, mild hyponatremia sodium 134, mild anemia hemoglobin 10.9, otherwise unremarkable.  EKG 02/07/2023: NSR.  Rate 69.  Incomplete right bundle branch block.  Nuclear stress 02/26/2023:   The study is normal. The study is low risk.   Left ventricular function is normal. Nuclear stress EF: 65 %. The left ventricular ejection fraction is normal (55-65%). End diastolic cavity size is normal.  Cath and PCI 11/16/2020: ? Prox Cx lesion is 50% stenosed. ? Mid Cx lesion is 99% stenosed. ?  Post intervention, there is a 0% residual stenosis. ? A drug-eluting stent was successfully placed using a STENT RESOLUTE ONYX 3.0X34. ? Post intervention, there is a 0% residual stenosis. ? Prox LAD to Mid LAD lesion is 40% stenosed with 60% stenosed side branch in 2nd Diag. ? Mid LAD lesion is 60% stenosed. ? Prox RCA lesion is 25% stenosed. ? There  is hyperdynamic left ventricular systolic function. ? LV end diastolic pressure is normal. ? The left ventricular ejection fraction is greater than 65% by visual estimate. ? There is no aortic valve stenosis.    ? Severe single-vessel disease with 99% focal mid LCx lesion just downstream to previously stent along with ~50% ISR in the previously placed stent (CULPRIT LESION-PCI);   o Successful DES PCI of mid LCx 99% lesion and overlapping the previously placed stent with 50+ percent ISR. ? Moderate mid LAD-2nd Diag disease best treated medically ? Hyperdynamic LV with normal EF.  Normal EDP   RECOMMENDATIONS ? With difficult femoral access after aborted radial access, we will keep the patient overnight for monitoring.  He did have a mild hematoma prior to his sheath exchanged from 6 [place] 7 [place] in the groin.  He was still having mild chest pain post PCI from high-pressure post dilation. ? Continue aggressive risk modification-would consider possibly converting from pravastatin to more high potency statin.  He has intolerance to beta-blocker listed which may not preclude use of carvedilol.  )        Anesthesia Quick Evaluation

## 2023-03-10 NOTE — Progress Notes (Signed)
Anesthesia Chart Review:  Follows cardiology for history of HTN, HLD, CAD s/p DES x 2 to LCx.  Seen by Dr. Tomie China 02/26/2023 for preop evaluation.  Per note, "Preoperative restratification for subclavian artery aneurysm resection: He had a stress test today and this did not reveal any evidence of ischemia and ejection fraction is preserved.  In view of this he is not at high risk for complications during the above-mentioned surgery.  Meticulous hemodynamic monitoring will further reduce the risk of coronary events."  Follows with pulmonology for history of cough variant asthma and severe dysphagia.  Former smoker, 35 pack years, quit 1998.  Last seen by Dr. Sherene Sires 12/27/2022.  Per note, "Experiencing rapid geriactric decline in setting of severe dysphagia/ wt loss so nothing to offer from pulmonary perspective if can't improve his nutritional status and refuses tube feeding / advised. Pulmonary f/u for worse sob/ cough / pleuritic cp prn "  Patient has had extensive workup for dysphagia.  Ultimately was evaluated by vascular surgeon Dr. 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  He recommended right subclavian artery transposition to the right common carotid artery and thoracic endovascular coverage of, diverticulum to relieve pressure on the esophagus.  Preop labs reviewed, mild hyponatremia sodium 134, mild anemia hemoglobin 10.9, otherwise unremarkable.  EKG 02/07/2023: NSR.  Rate 69.  Incomplete right bundle branch block.  Nuclear stress 02/26/2023:   The study is normal. The study is low risk.   Left ventricular function is normal. Nuclear stress EF: 65 %. The left ventricular ejection fraction is normal (55-65%). End diastolic cavity size is normal.   Cath and PCI 11/16/2020: Prox Cx lesion is 50% stenosed. Mid Cx lesion is 99% stenosed. Post intervention, there is a 0% residual stenosis. A drug-eluting stent was  successfully placed using a STENT RESOLUTE ONYX 3.0X34. Post intervention, there is a 0% residual stenosis. Prox LAD to Mid LAD lesion is 40% stenosed with 60% stenosed side branch in 2nd Diag. Mid LAD lesion is 60% stenosed. Prox RCA lesion is 25% stenosed. There is hyperdynamic left ventricular systolic function. LV end diastolic pressure is normal. The left ventricular ejection fraction is greater than 65% by visual estimate. There is no aortic valve stenosis.     Severe single-vessel disease with 99% focal mid LCx lesion just downstream to previously stent along with ~50% ISR in the previously placed stent (CULPRIT LESION-PCI);  Successful DES PCI of mid LCx 99% lesion and overlapping the previously placed stent with 50+ percent ISR. Moderate mid LAD-2nd Diag disease best treated medically Hyperdynamic LV with normal EF.  Normal EDP     RECOMMENDATIONS With difficult femoral access after aborted radial access, we will keep the patient overnight for monitoring.  He did have a mild hematoma prior to his sheath exchanged from 6 [place] 7 [place] in the groin.  He was still having mild chest pain post PCI from high-pressure post dilation. Continue aggressive risk modification-would consider possibly converting from pravastatin to more high potency statin.  He has intolerance to beta-blocker listed which may not preclude use of carvedilol.   Zannie Cove [facility] Short Stay Center/Anesthesiology Phone [phone] 03/10/2023 9:45 AM

## 2023-03-11 ENCOUNTER — Other Ambulatory Visit: Payer: Self-pay

## 2023-03-11 ENCOUNTER — Encounter (HOSPITAL_COMMUNITY)
Admission: RE | Disposition: A | Discharge status: Home Health | Payer: Self-pay | Source: Home / Self Care | Attending: Vascular Surgery

## 2023-03-11 ENCOUNTER — Inpatient Hospital Stay (HOSPITAL_COMMUNITY): Payer: Medicare HMO

## 2023-03-11 ENCOUNTER — Encounter (HOSPITAL_COMMUNITY): Payer: Self-pay | Admitting: Vascular Surgery

## 2023-03-11 ENCOUNTER — Inpatient Hospital Stay (HOSPITAL_COMMUNITY)
Admission: RE | Admit: 2023-03-11 | Discharge: 2023-03-24 | DRG: 219 | Disposition: A | Discharge status: Home Health | Payer: Medicare HMO | Attending: Vascular Surgery | Admitting: Vascular Surgery

## 2023-03-11 ENCOUNTER — Inpatient Hospital Stay (HOSPITAL_COMMUNITY): Payer: Medicare HMO | Admitting: Physician Assistant

## 2023-03-11 DIAGNOSIS — I2510 Atherosclerotic heart disease of native coronary artery without angina pectoris: Secondary | ICD-10-CM | POA: Diagnosis present

## 2023-03-11 DIAGNOSIS — Z8249 Family history of ischemic heart disease and other diseases of the circulatory system: Secondary | ICD-10-CM | POA: Diagnosis not present

## 2023-03-11 DIAGNOSIS — E785 Hyperlipidemia, unspecified: Secondary | ICD-10-CM | POA: Diagnosis present

## 2023-03-11 DIAGNOSIS — Q278 Other specified congenital malformations of peripheral vascular system: Secondary | ICD-10-CM | POA: Diagnosis not present

## 2023-03-11 DIAGNOSIS — Q2549 Other congenital malformations of aorta: Principal | ICD-10-CM

## 2023-03-11 DIAGNOSIS — M109 Gout, unspecified: Secondary | ICD-10-CM | POA: Diagnosis present

## 2023-03-11 DIAGNOSIS — J439 Emphysema, unspecified: Secondary | ICD-10-CM | POA: Diagnosis not present

## 2023-03-11 DIAGNOSIS — Z955 Presence of coronary angioplasty implant and graft: Secondary | ICD-10-CM | POA: Diagnosis not present

## 2023-03-11 DIAGNOSIS — Z87891 Personal history of nicotine dependence: Secondary | ICD-10-CM

## 2023-03-11 DIAGNOSIS — Z681 Body mass index (BMI) 19 or less, adult: Secondary | ICD-10-CM

## 2023-03-11 DIAGNOSIS — I129 Hypertensive chronic kidney disease with stage 1 through stage 4 chronic kidney disease, or unspecified chronic kidney disease: Secondary | ICD-10-CM | POA: Diagnosis not present

## 2023-03-11 DIAGNOSIS — Z7982 Long term (current) use of aspirin: Secondary | ICD-10-CM

## 2023-03-11 DIAGNOSIS — E291 Testicular hypofunction: Secondary | ICD-10-CM | POA: Diagnosis not present

## 2023-03-11 DIAGNOSIS — K219 Gastro-esophageal reflux disease without esophagitis: Secondary | ICD-10-CM | POA: Diagnosis not present

## 2023-03-11 DIAGNOSIS — Z7951 Long term (current) use of inhaled steroids: Secondary | ICD-10-CM

## 2023-03-11 DIAGNOSIS — Z791 Long term (current) use of non-steroidal anti-inflammatories (NSAID): Secondary | ICD-10-CM

## 2023-03-11 DIAGNOSIS — Q254 Congenital malformation of aorta unspecified: Secondary | ICD-10-CM

## 2023-03-11 DIAGNOSIS — M542 Cervicalgia: Secondary | ICD-10-CM | POA: Diagnosis not present

## 2023-03-11 DIAGNOSIS — Z885 Allergy status to narcotic agent status: Secondary | ICD-10-CM

## 2023-03-11 DIAGNOSIS — Z8546 Personal history of malignant neoplasm of prostate: Secondary | ICD-10-CM | POA: Diagnosis not present

## 2023-03-11 DIAGNOSIS — N189 Chronic kidney disease, unspecified: Secondary | ICD-10-CM | POA: Diagnosis not present

## 2023-03-11 DIAGNOSIS — Z452 Encounter for adjustment and management of vascular access device: Secondary | ICD-10-CM | POA: Diagnosis not present

## 2023-03-11 DIAGNOSIS — Q2548 Anomalous origin of subclavian artery: Secondary | ICD-10-CM | POA: Diagnosis not present

## 2023-03-11 DIAGNOSIS — M25519 Pain in unspecified shoulder: Secondary | ICD-10-CM | POA: Diagnosis not present

## 2023-03-11 DIAGNOSIS — K3189 Other diseases of stomach and duodenum: Secondary | ICD-10-CM | POA: Diagnosis not present

## 2023-03-11 DIAGNOSIS — R1310 Dysphagia, unspecified: Secondary | ICD-10-CM | POA: Diagnosis not present

## 2023-03-11 DIAGNOSIS — I739 Peripheral vascular disease, unspecified: Secondary | ICD-10-CM

## 2023-03-11 DIAGNOSIS — M8580 Other specified disorders of bone density and structure, unspecified site: Secondary | ICD-10-CM | POA: Diagnosis present

## 2023-03-11 DIAGNOSIS — K222 Esophageal obstruction: Secondary | ICD-10-CM | POA: Diagnosis present

## 2023-03-11 DIAGNOSIS — G250 Essential tremor: Secondary | ICD-10-CM | POA: Diagnosis present

## 2023-03-11 DIAGNOSIS — Z8701 Personal history of pneumonia (recurrent): Secondary | ICD-10-CM

## 2023-03-11 DIAGNOSIS — K224 Dyskinesia of esophagus: Secondary | ICD-10-CM

## 2023-03-11 DIAGNOSIS — I10 Essential (primary) hypertension: Secondary | ICD-10-CM | POA: Diagnosis not present

## 2023-03-11 DIAGNOSIS — D631 Anemia in chronic kidney disease: Secondary | ICD-10-CM | POA: Diagnosis present

## 2023-03-11 DIAGNOSIS — J90 Pleural effusion, not elsewhere classified: Secondary | ICD-10-CM | POA: Diagnosis not present

## 2023-03-11 DIAGNOSIS — J45991 Cough variant asthma: Secondary | ICD-10-CM | POA: Diagnosis present

## 2023-03-11 DIAGNOSIS — Z888 Allergy status to other drugs, medicaments and biological substances status: Secondary | ICD-10-CM

## 2023-03-11 DIAGNOSIS — J4489 Other specified chronic obstructive pulmonary disease: Secondary | ICD-10-CM | POA: Diagnosis present

## 2023-03-11 DIAGNOSIS — J9811 Atelectasis: Secondary | ICD-10-CM | POA: Diagnosis not present

## 2023-03-11 DIAGNOSIS — E43 Unspecified severe protein-calorie malnutrition: Secondary | ICD-10-CM | POA: Diagnosis not present

## 2023-03-11 DIAGNOSIS — Z974 Presence of external hearing-aid: Secondary | ICD-10-CM

## 2023-03-11 DIAGNOSIS — R1312 Dysphagia, oropharyngeal phase: Secondary | ICD-10-CM | POA: Diagnosis not present

## 2023-03-11 DIAGNOSIS — I25119 Atherosclerotic heart disease of native coronary artery with unspecified angina pectoris: Secondary | ICD-10-CM | POA: Diagnosis not present

## 2023-03-11 DIAGNOSIS — Z7902 Long term (current) use of antithrombotics/antiplatelets: Secondary | ICD-10-CM

## 2023-03-11 DIAGNOSIS — Z825 Family history of asthma and other chronic lower respiratory diseases: Secondary | ICD-10-CM

## 2023-03-11 DIAGNOSIS — J449 Chronic obstructive pulmonary disease, unspecified: Secondary | ICD-10-CM | POA: Diagnosis not present

## 2023-03-11 DIAGNOSIS — I6523 Occlusion and stenosis of bilateral carotid arteries: Secondary | ICD-10-CM | POA: Diagnosis not present

## 2023-03-11 DIAGNOSIS — R634 Abnormal weight loss: Secondary | ICD-10-CM | POA: Diagnosis present

## 2023-03-11 DIAGNOSIS — R918 Other nonspecific abnormal finding of lung field: Secondary | ICD-10-CM | POA: Diagnosis present

## 2023-03-11 DIAGNOSIS — D649 Anemia, unspecified: Secondary | ICD-10-CM | POA: Diagnosis not present

## 2023-03-11 DIAGNOSIS — H903 Sensorineural hearing loss, bilateral: Secondary | ICD-10-CM | POA: Diagnosis present

## 2023-03-11 DIAGNOSIS — J45909 Unspecified asthma, uncomplicated: Secondary | ICD-10-CM | POA: Diagnosis not present

## 2023-03-11 DIAGNOSIS — Z79899 Other long term (current) drug therapy: Secondary | ICD-10-CM

## 2023-03-11 DIAGNOSIS — I700 Atherosclerosis of aorta: Secondary | ICD-10-CM | POA: Diagnosis not present

## 2023-03-11 DIAGNOSIS — Z880 Allergy status to penicillin: Secondary | ICD-10-CM

## 2023-03-11 DIAGNOSIS — E46 Unspecified protein-calorie malnutrition: Secondary | ICD-10-CM | POA: Diagnosis not present

## 2023-03-11 HISTORY — DX: Other congenital malformations of aorta: Q25.49

## 2023-03-11 HISTORY — PX: CAROTID-SUBCLAVIAN BYPASS GRAFT: SHX910

## 2023-03-11 HISTORY — PX: INSERTION OF ILIAC STENT: SHX6256

## 2023-03-11 HISTORY — PX: THORACIC AORTIC ENDOVASCULAR STENT GRAFT: SHX6112

## 2023-03-11 HISTORY — PX: ULTRASOUND GUIDANCE FOR VASCULAR ACCESS: SHX6516

## 2023-03-11 LAB — CBC
HCT: 26.5 % — ABNORMAL LOW (ref 39.0–52.0)
Hemoglobin: 9.3 g/dL — ABNORMAL LOW (ref 13.0–17.0)
MCH: 33.5 pg (ref 26.0–34.0)
MCHC: 35.1 g/dL (ref 30.0–36.0)
MCV: 95.3 fL (ref 80.0–100.0)
Platelets: 177 10*3/uL (ref 150–400)
RBC: 2.78 MIL/uL — ABNORMAL LOW (ref 4.22–5.81)
RDW: 13.3 % (ref 11.5–15.5)
WBC: 10.4 10*3/uL (ref 4.0–10.5)
nRBC: 0 % (ref 0.0–0.2)

## 2023-03-11 LAB — BASIC METABOLIC PANEL
Anion gap: 6 (ref 5–15)
BUN: 18 mg/dL (ref 8–23)
CO2: 23 mmol/L (ref 22–32)
Calcium: 8.4 mg/dL — ABNORMAL LOW (ref 8.9–10.3)
Chloride: 101 mmol/L (ref 98–111)
Creatinine, Ser: 0.78 mg/dL (ref 0.61–1.24)
GFR, Estimated: >60 mL/min (ref >60)
Glucose, Bld: 124 mg/dL — ABNORMAL HIGH (ref 70–99)
Potassium: 4.1 mmol/L (ref 3.5–5.1)
Sodium: 130 mmol/L — ABNORMAL LOW (ref 135–145)

## 2023-03-11 LAB — APTT: aPTT: 29 seconds (ref 24–36)

## 2023-03-11 LAB — POCT ACTIVATED CLOTTING TIME
Activated Clotting Time: 249 seconds
Activated Clotting Time: 233 seconds
Activated Clotting Time: 260 seconds

## 2023-03-11 LAB — ABO/RH: ABO/RH(D): O POS

## 2023-03-11 LAB — MAGNESIUM: Magnesium: 1.6 mg/dL — ABNORMAL LOW (ref 1.7–2.4)

## 2023-03-11 LAB — PROTIME-INR
INR: 1.2 (ref 0.8–1.2)
Prothrombin Time: 15 seconds (ref 11.4–15.2)

## 2023-03-11 SURGERY — CREATION, BYPASS, ARTERIAL, SUBCLAVIAN TO CAROTID, USING GRAFT
Anesthesia: General | Site: Groin | Laterality: Right

## 2023-03-11 MED ORDER — CELECOXIB 200 MG PO CAPS
200.0000 mg | ORAL_CAPSULE | Freq: Every day | ORAL | Status: DC
  Administered 2023-03-12 – 2023-03-14 (×3): 200 mg via ORAL
  Filled 2023-03-11 (×8): qty 1

## 2023-03-11 MED ORDER — ROCURONIUM BROMIDE 10 MG/ML (PF) SYRINGE
PREFILLED_SYRINGE | INTRAVENOUS | Status: AC
  Filled 2023-03-11: qty 10

## 2023-03-11 MED ORDER — MAGNESIUM SULFATE 2 GM/50ML IV SOLN: 2.0000 g | Freq: Every day | INTRAVENOUS | Status: DC | PRN

## 2023-03-11 MED ORDER — LIDOCAINE HCL (PF) 1 % IJ SOLN
INTRAMUSCULAR | Status: AC
  Filled 2023-03-11: qty 30

## 2023-03-11 MED ORDER — PROPOFOL 10 MG/ML IV BOLUS
INTRAVENOUS | Status: DC | PRN
  Administered 2023-03-11: 120 mg via INTRAVENOUS
  Administered 2023-03-11: 20 mg via INTRAVENOUS

## 2023-03-11 MED ORDER — EPHEDRINE SULFATE-NACL 50-0.9 MG/10ML-% IV SOSY
PREFILLED_SYRINGE | INTRAVENOUS | Status: DC | PRN
  Administered 2023-03-11 (×3): 5 mg via INTRAVENOUS
  Administered 2023-03-11: 10 mg via INTRAVENOUS
  Administered 2023-03-11 (×2): 5 mg via INTRAVENOUS

## 2023-03-11 MED ORDER — ISOSORBIDE MONONITRATE ER 30 MG PO TB24
15.0000 mg | ORAL_TABLET | Freq: Every day | ORAL | Status: DC
  Administered 2023-03-12 – 2023-03-23 (×5): 15 mg via ORAL
  Filled 2023-03-11 (×8): qty 1

## 2023-03-11 MED ORDER — HEPARIN 6000 UNIT IRRIGATION SOLUTION
Status: AC
  Filled 2023-03-11: qty 500

## 2023-03-11 MED ORDER — LACTATED RINGERS IV SOLN: INTRAVENOUS | Status: DC

## 2023-03-11 MED ORDER — ONDANSETRON HCL 4 MG/2ML IJ SOLN: 4.0000 mg | Freq: Four times a day (QID) | INTRAMUSCULAR | Status: DC | PRN

## 2023-03-11 MED ORDER — IRBESARTAN 300 MG PO TABS: 300.0000 mg | ORAL_TABLET | Freq: Every day | ORAL | Status: DC | PRN

## 2023-03-11 MED ORDER — ONDANSETRON HCL 4 MG/2ML IJ SOLN
4.0000 mg | Freq: Four times a day (QID) | INTRAMUSCULAR | Status: DC | PRN
  Administered 2023-03-24: 4 mg via INTRAVENOUS
  Filled 2023-03-11: qty 2

## 2023-03-11 MED ORDER — ALLOPURINOL 100 MG PO TABS
100.0000 mg | ORAL_TABLET | Freq: Every day | ORAL | Status: DC
  Administered 2023-03-12 – 2023-03-14 (×3): 100 mg via ORAL
  Filled 2023-03-11 (×4): qty 1

## 2023-03-11 MED ORDER — CLOPIDOGREL BISULFATE 75 MG PO TABS
75.0000 mg | ORAL_TABLET | Freq: Every day | ORAL | Status: DC
  Administered 2023-03-12 – 2023-03-14 (×3): 75 mg via ORAL
  Filled 2023-03-11 (×3): qty 1

## 2023-03-11 MED ORDER — OXYCODONE HCL 5 MG/5ML PO SOLN: 5.0000 mg | Freq: Once | ORAL | Status: DC | PRN

## 2023-03-11 MED ORDER — ONDANSETRON HCL 4 MG/2ML IJ SOLN
INTRAMUSCULAR | Status: DC | PRN
  Administered 2023-03-11: 4 mg via INTRAVENOUS

## 2023-03-11 MED ORDER — CHLORHEXIDINE GLUCONATE CLOTH 2 % EX PADS: 6.0000 | MEDICATED_PAD | Freq: Once | CUTANEOUS | Status: DC

## 2023-03-11 MED ORDER — ALBUTEROL SULFATE (2.5 MG/3ML) 0.083% IN NEBU: 2.5000 mg | INHALATION_SOLUTION | Freq: Four times a day (QID) | RESPIRATORY_TRACT | Status: DC | PRN

## 2023-03-11 MED ORDER — DEXAMETHASONE SODIUM PHOSPHATE 10 MG/ML IJ SOLN
INTRAMUSCULAR | Status: AC
  Filled 2023-03-11: qty 1

## 2023-03-11 MED ORDER — ASPIRIN 81 MG PO TBEC
81.0000 mg | DELAYED_RELEASE_TABLET | Freq: Every day | ORAL | Status: DC
  Administered 2023-03-12 – 2023-03-14 (×3): 81 mg via ORAL
  Filled 2023-03-11 (×4): qty 1

## 2023-03-11 MED ORDER — LABETALOL HCL 5 MG/ML IV SOLN
INTRAVENOUS | Status: DC | PRN
  Administered 2023-03-11: 2.5 mg via INTRAVENOUS

## 2023-03-11 MED ORDER — VANCOMYCIN HCL IN DEXTROSE 1-5 GM/200ML-% IV SOLN
1000.0000 mg | INTRAVENOUS | Status: AC
  Administered 2023-03-12: 1000 mg via INTRAVENOUS
  Filled 2023-03-11: qty 200

## 2023-03-11 MED ORDER — SODIUM CHLORIDE 0.9 % IV SOLN: INTRAVENOUS | Status: DC

## 2023-03-11 MED ORDER — PHENYLEPHRINE HCL-NACL 20-0.9 MG/250ML-% IV SOLN
INTRAVENOUS | Status: DC | PRN
  Administered 2023-03-11: 50 ug/min via INTRAVENOUS

## 2023-03-11 MED ORDER — DOCUSATE SODIUM 100 MG PO CAPS
100.0000 mg | ORAL_CAPSULE | Freq: Every day | ORAL | Status: DC
  Administered 2023-03-12 – 2023-03-14 (×3): 100 mg via ORAL
  Filled 2023-03-11 (×4): qty 1

## 2023-03-11 MED ORDER — PHENOL 1.4 % MT LIQD: 1.0000 | OROMUCOSAL | Status: DC | PRN

## 2023-03-11 MED ORDER — FENTANYL CITRATE (PF) 250 MCG/5ML IJ SOLN
INTRAMUSCULAR | Status: DC | PRN
  Administered 2023-03-11: 50 ug via INTRAVENOUS
  Administered 2023-03-11: 100 ug via INTRAVENOUS

## 2023-03-11 MED ORDER — POTASSIUM CHLORIDE CRYS ER 20 MEQ PO TBCR: 20.0000 meq | EXTENDED_RELEASE_TABLET | Freq: Every day | ORAL | Status: DC | PRN

## 2023-03-11 MED ORDER — SODIUM CHLORIDE 0.9 % IV SOLN: 500.0000 mL | Freq: Once | INTRAVENOUS | Status: DC | PRN

## 2023-03-11 MED ORDER — ROCURONIUM BROMIDE 10 MG/ML (PF) SYRINGE
PREFILLED_SYRINGE | INTRAVENOUS | Status: DC | PRN
  Administered 2023-03-11 (×2): 50 mg via INTRAVENOUS
  Administered 2023-03-11: 20 mg via INTRAVENOUS

## 2023-03-11 MED ORDER — SENNOSIDES-DOCUSATE SODIUM 8.6-50 MG PO TABS
1.0000 | ORAL_TABLET | Freq: Every evening | ORAL | Status: DC | PRN
  Administered 2023-03-12 – 2023-03-14 (×3): 1 via ORAL
  Filled 2023-03-11 (×3): qty 1

## 2023-03-11 MED ORDER — ACETAMINOPHEN 650 MG RE SUPP
325.0000 mg | RECTAL | Status: DC | PRN
  Administered 2023-03-18: 650 mg via RECTAL
  Filled 2023-03-11: qty 1

## 2023-03-11 MED ORDER — VANCOMYCIN HCL IN DEXTROSE 1-5 GM/200ML-% IV SOLN
1000.0000 mg | INTRAVENOUS | Status: DC
  Filled 2023-03-11: qty 200

## 2023-03-11 MED ORDER — HEPARIN SODIUM (PORCINE) 1000 UNIT/ML IJ SOLN
INTRAMUSCULAR | Status: AC
  Filled 2023-03-11: qty 20

## 2023-03-11 MED ORDER — ALUM & MAG HYDROXIDE-SIMETH 200-200-20 MG/5ML PO SUSP: 15.0000 mL | ORAL | Status: DC | PRN

## 2023-03-11 MED ORDER — FAMOTIDINE 20 MG PO TABS
40.0000 mg | ORAL_TABLET | Freq: Every day | ORAL | Status: DC
  Administered 2023-03-12 – 2023-03-14 (×3): 40 mg via ORAL
  Filled 2023-03-11 (×4): qty 2

## 2023-03-11 MED ORDER — OXYCODONE HCL 5 MG PO TABS: 5.0000 mg | ORAL_TABLET | Freq: Once | ORAL | Status: DC | PRN

## 2023-03-11 MED ORDER — LACTATED RINGERS IV SOLN: INTRAVENOUS | Status: DC | PRN

## 2023-03-11 MED ORDER — ADULT MULTIVITAMIN W/MINERALS CH
1.0000 | ORAL_TABLET | Freq: Every day | ORAL | Status: DC
  Administered 2023-03-12 – 2023-03-13 (×2): 1 via ORAL
  Filled 2023-03-11 (×4): qty 1

## 2023-03-11 MED ORDER — 0.9 % SODIUM CHLORIDE (POUR BTL) OPTIME
TOPICAL | Status: DC | PRN
  Administered 2023-03-11: 1000 mL

## 2023-03-11 MED ORDER — PROPOFOL 10 MG/ML IV BOLUS
INTRAVENOUS | Status: AC
  Filled 2023-03-11: qty 20

## 2023-03-11 MED ORDER — TRAMADOL HCL 50 MG PO TABS
50.0000 mg | ORAL_TABLET | Freq: Four times a day (QID) | ORAL | Status: DC | PRN
  Administered 2023-03-13: 50 mg via ORAL
  Filled 2023-03-11 (×2): qty 1

## 2023-03-11 MED ORDER — METOPROLOL TARTRATE 5 MG/5ML IV SOLN: 2.0000 mg | INTRAVENOUS | Status: DC | PRN

## 2023-03-11 MED ORDER — ACETAMINOPHEN 325 MG PO TABS: 325.0000 mg | ORAL_TABLET | ORAL | Status: DC | PRN

## 2023-03-11 MED ORDER — LABETALOL HCL 5 MG/ML IV SOLN: 10.0000 mg | INTRAVENOUS | Status: DC | PRN

## 2023-03-11 MED ORDER — LACTATED RINGERS IV BOLUS
500.0000 mL | Freq: Once | INTRAVENOUS | Status: AC
  Administered 2023-03-11: 500 mL via INTRAVENOUS

## 2023-03-11 MED ORDER — SUGAMMADEX SODIUM 200 MG/2ML IV SOLN
INTRAVENOUS | Status: DC | PRN
  Administered 2023-03-11: 200 mg via INTRAVENOUS

## 2023-03-11 MED ORDER — EPHEDRINE 5 MG/ML INJ
INTRAVENOUS | Status: AC
  Filled 2023-03-11: qty 10

## 2023-03-11 MED ORDER — LIDOCAINE 2% (20 MG/ML) 5 ML SYRINGE
INTRAMUSCULAR | Status: AC
  Filled 2023-03-11: qty 5

## 2023-03-11 MED ORDER — HEPARIN SODIUM (PORCINE) 5000 UNIT/ML IJ SOLN
5000.0000 [IU] | Freq: Three times a day (TID) | INTRAMUSCULAR | Status: DC
  Administered 2023-03-12 – 2023-03-24 (×35): 5000 [IU] via SUBCUTANEOUS
  Filled 2023-03-11 (×32): qty 1

## 2023-03-11 MED ORDER — GUAIFENESIN-DM 100-10 MG/5ML PO SYRP: 15.0000 mL | ORAL_SOLUTION | ORAL | Status: DC | PRN

## 2023-03-11 MED ORDER — ONDANSETRON HCL 4 MG/2ML IJ SOLN
INTRAMUSCULAR | Status: AC
  Filled 2023-03-11: qty 2

## 2023-03-11 MED ORDER — HEMOSTATIC AGENTS (NO CHARGE) OPTIME
TOPICAL | Status: DC | PRN
  Administered 2023-03-11: 1 via TOPICAL

## 2023-03-11 MED ORDER — HYDROMORPHONE HCL 1 MG/ML IJ SOLN
0.5000 mg | INTRAMUSCULAR | Status: DC | PRN
  Administered 2023-03-11 (×2): 0.5 mg via INTRAVENOUS
  Administered 2023-03-11 – 2023-03-12 (×3): 1 mg via INTRAVENOUS
  Administered 2023-03-12: 0.5 mg via INTRAVENOUS
  Administered 2023-03-12 – 2023-03-13 (×2): 1 mg via INTRAVENOUS
  Administered 2023-03-13: 0.5 mg via INTRAVENOUS
  Administered 2023-03-13: 1 mg via INTRAVENOUS
  Administered 2023-03-14: 0.5 mg via INTRAVENOUS
  Administered 2023-03-15 (×3): 1 mg via INTRAVENOUS
  Administered 2023-03-16: 0.5 mg via INTRAVENOUS
  Administered 2023-03-16 (×2): 1 mg via INTRAVENOUS
  Administered 2023-03-16: 0.5 mg via INTRAVENOUS
  Administered 2023-03-16 – 2023-03-21 (×23): 1 mg via INTRAVENOUS
  Administered 2023-03-22: 0.5 mg via INTRAVENOUS
  Administered 2023-03-22 – 2023-03-23 (×3): 1 mg via INTRAVENOUS
  Administered 2023-03-23 – 2023-03-24 (×2): 0.5 mg via INTRAVENOUS
  Filled 2023-03-11 (×2): qty 1
  Filled 2023-03-11 (×2): qty 0.5
  Filled 2023-03-11: qty 1
  Filled 2023-03-11: qty 0.5
  Filled 2023-03-11: qty 1
  Filled 2023-03-11 (×2): qty 0.5
  Filled 2023-03-11 (×9): qty 1
  Filled 2023-03-11: qty 0.5
  Filled 2023-03-11 (×8): qty 1
  Filled 2023-03-11: qty 0.5
  Filled 2023-03-11: qty 1
  Filled 2023-03-11: qty 0.5
  Filled 2023-03-11: qty 1
  Filled 2023-03-11: qty 0.5
  Filled 2023-03-11 (×14): qty 1

## 2023-03-11 MED ORDER — LIDOCAINE 2% (20 MG/ML) 5 ML SYRINGE
INTRAMUSCULAR | Status: DC | PRN
  Administered 2023-03-11: 60 mg via INTRAVENOUS

## 2023-03-11 MED ORDER — FLUTICASONE FUROATE-VILANTEROL 200-25 MCG/ACT IN AEPB
1.0000 | INHALATION_SPRAY | Freq: Every day | RESPIRATORY_TRACT | Status: DC
  Administered 2023-03-14 – 2023-03-24 (×11): 1 via RESPIRATORY_TRACT
  Filled 2023-03-11: qty 28

## 2023-03-11 MED ORDER — HEPARIN SODIUM (PORCINE) 1000 UNIT/ML IJ SOLN
INTRAMUSCULAR | Status: DC | PRN
  Administered 2023-03-11: 3000 [IU] via INTRAVENOUS
  Administered 2023-03-11: 8000 [IU] via INTRAVENOUS
  Administered 2023-03-11: 3000 [IU] via INTRAVENOUS

## 2023-03-11 MED ORDER — HEPARIN 6000 UNIT IRRIGATION SOLUTION
Status: DC | PRN
  Administered 2023-03-11: 1

## 2023-03-11 MED ORDER — ORAL CARE MOUTH RINSE: 15.0000 mL | Freq: Once | OROMUCOSAL | Status: AC

## 2023-03-11 MED ORDER — VANCOMYCIN HCL 1000 MG IV SOLR
INTRAVENOUS | Status: DC | PRN
  Administered 2023-03-11: 1000 mg via INTRAVENOUS

## 2023-03-11 MED ORDER — PANTOPRAZOLE SODIUM 40 MG PO TBEC
40.0000 mg | DELAYED_RELEASE_TABLET | Freq: Every morning | ORAL | Status: DC
  Administered 2023-03-12 – 2023-03-14 (×3): 40 mg via ORAL
  Filled 2023-03-11 (×3): qty 1

## 2023-03-11 MED ORDER — FLUTICASONE PROPIONATE 50 MCG/ACT NA SUSP
2.0000 | Freq: Every day | NASAL | Status: DC
  Administered 2023-03-12 – 2023-03-24 (×12): 2 via NASAL
  Filled 2023-03-11: qty 16

## 2023-03-11 MED ORDER — LORATADINE 10 MG PO TABS
10.0000 mg | ORAL_TABLET | Freq: Every day | ORAL | Status: DC
  Administered 2023-03-12 – 2023-03-24 (×8): 10 mg via ORAL
  Filled 2023-03-11 (×9): qty 1

## 2023-03-11 MED ORDER — PRIMIDONE 50 MG PO TABS
50.0000 mg | ORAL_TABLET | Freq: Every day | ORAL | Status: DC
  Administered 2023-03-12 – 2023-03-17 (×5): 50 mg via ORAL
  Filled 2023-03-11 (×6): qty 1

## 2023-03-11 MED ORDER — PHENYLEPHRINE 80 MCG/ML (10ML) SYRINGE FOR IV PUSH (FOR BLOOD PRESSURE SUPPORT)
PREFILLED_SYRINGE | INTRAVENOUS | Status: DC | PRN
  Administered 2023-03-11 (×2): 80 ug via INTRAVENOUS
  Administered 2023-03-11: 160 ug via INTRAVENOUS
  Administered 2023-03-11 (×3): 80 ug via INTRAVENOUS
  Administered 2023-03-11: 160 ug via INTRAVENOUS

## 2023-03-11 MED ORDER — TAMSULOSIN HCL 0.4 MG PO CAPS
0.4000 mg | ORAL_CAPSULE | Freq: Every day | ORAL | Status: DC
  Administered 2023-03-12 – 2023-03-24 (×8): 0.4 mg via ORAL
  Filled 2023-03-11 (×9): qty 1

## 2023-03-11 MED ORDER — HYDROMORPHONE HCL 1 MG/ML IJ SOLN
INTRAMUSCULAR | Status: AC
  Filled 2023-03-11: qty 1

## 2023-03-11 MED ORDER — PRAVASTATIN SODIUM 40 MG PO TABS
40.0000 mg | ORAL_TABLET | Freq: Every day | ORAL | Status: DC
  Administered 2023-03-12 – 2023-03-14 (×3): 40 mg via ORAL
  Filled 2023-03-11 (×4): qty 1

## 2023-03-11 MED ORDER — IODIXANOL 320 MG/ML IV SOLN
INTRAVENOUS | Status: DC | PRN
  Administered 2023-03-11: 124.6 mL via INTRA_ARTERIAL

## 2023-03-11 MED ORDER — CHLORHEXIDINE GLUCONATE 0.12 % MT SOLN
15.0000 mL | Freq: Once | OROMUCOSAL | Status: AC
  Administered 2023-03-11: 15 mL via OROMUCOSAL
  Filled 2023-03-11: qty 15

## 2023-03-11 MED ORDER — HYDRALAZINE HCL 20 MG/ML IJ SOLN: 5.0000 mg | INTRAMUSCULAR | Status: DC | PRN

## 2023-03-11 MED ORDER — FENTANYL CITRATE (PF) 250 MCG/5ML IJ SOLN
INTRAMUSCULAR | Status: AC
  Filled 2023-03-11: qty 5

## 2023-03-11 MED ORDER — DEXAMETHASONE SODIUM PHOSPHATE 10 MG/ML IJ SOLN
INTRAMUSCULAR | Status: DC | PRN
  Administered 2023-03-11: 4 mg via INTRAVENOUS

## 2023-03-11 MED ORDER — SODIUM CHLORIDE 0.9 % IV SOLN
INTRAVENOUS | Status: DC
  Administered 2023-03-13: 75 mL/h via INTRAVENOUS

## 2023-03-11 MED ORDER — BISACODYL 5 MG PO TBEC
5.0000 mg | DELAYED_RELEASE_TABLET | Freq: Every day | ORAL | Status: DC | PRN
  Administered 2023-03-13 – 2023-03-21 (×2): 5 mg via ORAL
  Filled 2023-03-11 (×2): qty 1

## 2023-03-11 MED ORDER — FENTANYL CITRATE (PF) 100 MCG/2ML IJ SOLN: 25.0000 ug | INTRAMUSCULAR | Status: DC | PRN

## 2023-03-11 MED ORDER — PROTAMINE SULFATE 10 MG/ML IV SOLN
INTRAVENOUS | Status: DC | PRN
  Administered 2023-03-11: 50 mg via INTRAVENOUS

## 2023-03-11 SURGICAL SUPPLY — 104 items
ADH SKN CLS APL DERMABOND .7 (GAUZE/BANDAGES/DRESSINGS) ×6
ADPR TBG 2 MALE LL ART (MISCELLANEOUS) ×3
BAG COUNTER SPONGE SURGICOUNT (BAG) ×4 IMPLANT
BAG SPNG CNTER NS LX DISP (BAG) ×3
CANISTER SUCT 3000ML PPV (MISCELLANEOUS) ×4 IMPLANT
CATH ACCU-VU SIZ PIG 5F 100CM (CATHETERS) IMPLANT
CATH ANGIO 5F BER2 100CM (CATHETERS) IMPLANT
CATH QUICKCROSS SUPP .035X90CM (MICROCATHETER) IMPLANT
CATH ROBINSON RED A/P 18FR (CATHETERS) ×4 IMPLANT
CLIP LIGATING EXTRA MED SLVR (CLIP) ×4 IMPLANT
CLIP LIGATING EXTRA SM BLUE (MISCELLANEOUS) ×4 IMPLANT
CLOSURE PERCLOSE PROSTYLE (VASCULAR PRODUCTS) IMPLANT
DERMABOND ADVANCED .7 DNX12 (GAUZE/BANDAGES/DRESSINGS) ×4 IMPLANT
DEVICE CLOSURE PERCLS PRGLD 6F (VASCULAR PRODUCTS) IMPLANT
DRAIN CHANNEL 15F RND FF W/TCR (WOUND CARE) IMPLANT
DRAIN RELI 100 BL SUC LF ST (DRAIN)
DRSG TEGADERM 2-3/8X2-3/4 SM (GAUZE/BANDAGES/DRESSINGS) ×4 IMPLANT
ELECT CAUTERY BLADE 6.4 (BLADE) ×4 IMPLANT
ELECT REM PT RETURN 9FT ADLT (ELECTROSURGICAL) ×6
ELECTRODE REM PT RTRN 9FT ADLT (ELECTROSURGICAL) ×8 IMPLANT
EVACUATOR SILICONE 100CC (DRAIN) IMPLANT
FELT TEFLON 1X6 (MISCELLANEOUS) IMPLANT
GAUZE 4X4 16PLY ~~LOC~~+RFID DBL (SPONGE) IMPLANT
GAUZE SPONGE 4X4 12PLY STRL (GAUZE/BANDAGES/DRESSINGS) ×4 IMPLANT
GLIDEWIRE ADV .035X260CM (WIRE) IMPLANT
GLOVE BIO SURGEON STRL SZ7.5 (GLOVE) ×4 IMPLANT
GOWN STRL REUS W/ TWL LRG LVL3 (GOWN DISPOSABLE) ×8 IMPLANT
GOWN STRL REUS W/ TWL XL LVL3 (GOWN DISPOSABLE) ×4 IMPLANT
GOWN STRL REUS W/TWL LRG LVL3 (GOWN DISPOSABLE) ×6
GOWN STRL REUS W/TWL XL LVL3 (GOWN DISPOSABLE) ×3
GRAFT BALLN CATH 65CM (BALLOONS) IMPLANT
GRAFT ENDOVASC 38X117 18F (Endovascular Graft) IMPLANT
HEMOSTAT SNOW SURGICEL 2X4 (HEMOSTASIS) IMPLANT
INSERT FOGARTY SM (MISCELLANEOUS) ×4 IMPLANT
IV ADAPTER SYR DOUBLE MALE LL (MISCELLANEOUS) ×4 IMPLANT
IV ADPR TBG 2 MALE LL ART (MISCELLANEOUS) ×3
KIT BASIN OR (CUSTOM PROCEDURE TRAY) ×4 IMPLANT
KIT DILATOR VASC 18G NDL (KITS) IMPLANT
KIT DRAIN CSF ACCUDRAIN (MISCELLANEOUS) IMPLANT
KIT ENCORE 26 ADVANTAGE (KITS) IMPLANT
KIT SHUNT ARGYLE CAROTID ART 6 (VASCULAR PRODUCTS) IMPLANT
KIT TURNOVER KIT B (KITS) ×4 IMPLANT
LOOP VASCLR MAXI BLUE 18IN ST (MISCELLANEOUS) IMPLANT
LOOP VASCULAR MAXI 18 BLUE (MISCELLANEOUS) ×3
LOOP VASCULAR MINI 18 RED (MISCELLANEOUS) ×6
LOOPS VASCLR MAXI BLUE 18IN ST (MISCELLANEOUS) ×3 IMPLANT
NDL HYPO 25GX1X1/2 BEV (NEEDLE) IMPLANT
NDL PERC 18GX7CM (NEEDLE) ×4 IMPLANT
NEEDLE HYPO 25GX1X1/2 BEV (NEEDLE) IMPLANT
NEEDLE PERC 18GX7CM (NEEDLE) ×3 IMPLANT
NS IRRIG 1000ML POUR BTL (IV SOLUTION) ×12 IMPLANT
PACK CAROTID (CUSTOM PROCEDURE TRAY) ×4 IMPLANT
PACK ENDOVASCULAR (PACKS) ×4 IMPLANT
PAD ARMBOARD 7.5X6 YLW CONV (MISCELLANEOUS) ×8 IMPLANT
PENCIL BUTTON HOLSTER BLD 10FT (ELECTRODE) ×4 IMPLANT
PERCLOSE PROGLIDE 6F (VASCULAR PRODUCTS)
POSITIONER HEAD DONUT 9IN (MISCELLANEOUS) ×4 IMPLANT
POWDER SURGICEL 3.0 GRAM (HEMOSTASIS) IMPLANT
SET COLLECT BLD 21X3/4 12 (NEEDLE) ×4 IMPLANT
SET DILATOR ENDOVASCULAR 20FR (SET/KITS/TRAYS/PACK) IMPLANT
SHEATH AVANTI 11CM 8FR (SHEATH) IMPLANT
SHEATH BRITE TIP 8FR 23CM (SHEATH) IMPLANT
SHEATH DRYSEAL FLEX 16FR 33CM (SHEATH) IMPLANT
SHEATH HIGHFLEX ANSEL 6FRX55 (SHEATH) IMPLANT
SHEATH PINNACLE 5F 10CM (SHEATH) IMPLANT
SHIELD RADPAD SCOOP 12X17 (MISCELLANEOUS) ×8 IMPLANT
SHUNT CAROTID BYPASS 10 (VASCULAR PRODUCTS) IMPLANT
SHUNT CAROTID BYPASS 12FRX15.5 (VASCULAR PRODUCTS) IMPLANT
STENT VIABAHN 11X59X135 (Permanent Stent) IMPLANT
STENT VIABAHN 11X59X135CM (Permanent Stent) ×3 IMPLANT
STOPCOCK 4 WAY LG BORE MALE ST (IV SETS) IMPLANT
STOPCOCK MORSE 400PSI 3WAY (MISCELLANEOUS) ×4 IMPLANT
SUT ETHILON 3 0 PS 1 (SUTURE) IMPLANT
SUT MNCRL AB 4-0 PS2 18 (SUTURE) IMPLANT
SUT PROLENE 5 0 C 1 24 (SUTURE) IMPLANT
SUT PROLENE 6 0 BV (SUTURE) ×4 IMPLANT
SUT PROLENE 7 0 BV 1 (SUTURE) IMPLANT
SUT SILK 2 0 (SUTURE) ×3
SUT SILK 2-0 18XBRD TIE 12 (SUTURE) IMPLANT
SUT SILK 3 0 (SUTURE) ×3
SUT SILK 3-0 18XBRD TIE 12 (SUTURE) IMPLANT
SUT SILK 4 0 (SUTURE) ×3
SUT SILK 4-0 18XBRD TIE 12 (SUTURE) IMPLANT
SUT VIC AB 2-0 CT1 27 (SUTURE)
SUT VIC AB 2-0 CT1 TAPERPNT 27 (SUTURE) IMPLANT
SUT VIC AB 3-0 SH 27 (SUTURE) ×9
SUT VIC AB 3-0 SH 27X BRD (SUTURE) ×8 IMPLANT
SUT VICRYL 4-0 PS2 18IN ABS (SUTURE) ×4 IMPLANT
SYR 30ML LL (SYRINGE) IMPLANT
SYR 50ML LL SCALE MARK (SYRINGE) ×4 IMPLANT
SYR BULB IRRIG 60ML STRL (SYRINGE) IMPLANT
SYR CONTROL 10ML LL (SYRINGE) IMPLANT
TAPE UMBILICAL 1/8X18 (MISCELLANEOUS) IMPLANT
TOWEL GREEN STERILE (TOWEL DISPOSABLE) ×4 IMPLANT
TRAY FOLEY MTR SLVR 16FR STAT (SET/KITS/TRAYS/PACK) ×4 IMPLANT
TUBING ART PRESS 48 MALE/FEM (TUBING) ×4 IMPLANT
TUBING EXTENTION W/L.L. (IV SETS) IMPLANT
TUBING HIGH PRESSURE 120CM (CONNECTOR) ×4 IMPLANT
VASCULAR TIE MAXI BLUE 18IN ST (MISCELLANEOUS) ×3
VASCULAR TIE MINI RED 18IN STL (MISCELLANEOUS) IMPLANT
WATER STERILE IRR 1000ML POUR (IV SOLUTION) ×4 IMPLANT
WIRE AMPLATZ SS-J .035X260CM (WIRE) IMPLANT
WIRE BENTSON .035X145CM (WIRE) IMPLANT
WIRE STIFF LUNDERQUIST 260CM (WIRE) IMPLANT

## 2023-03-11 NOTE — Progress Notes (Signed)
Pt had coughed right after putting a small ice chip in mouth.  Swabbed mouth to moisten it.  Explained to pt he is to have nothing to eat or drink until SLP evals pt.  Pt verbalizes understanding.

## 2023-03-11 NOTE — Anesthesia Procedure Notes (Addendum)
Arterial Line Insertion Start/End5/05/2023 7:20 AM, 03/11/2023 7:25 AM Performed by: Achille Rich, MD, Kevontae Burgoon, Canary Brim, CRNA, CRNA  Patient location: OR. Preanesthetic checklist: patient identified, IV checked, site marked, risks and benefits discussed, surgical consent, monitors and equipment checked, pre-op evaluation, timeout performed and anesthesia consent Lidocaine 1% used for infiltration Left, radial was placed Catheter size: 20 G Hand hygiene performed , maximum sterile barriers used  and Seldinger technique used Allen's test indicative of satisfactory collateral circulation Attempts: 1 Procedure performed without using ultrasound guided technique. Following insertion, Biopatch and dressing applied. Post procedure assessment: normal  Patient tolerated the procedure well with no immediate complications. Additional procedure comments: A line inserted by Terri Piedra.

## 2023-03-11 NOTE — Transfer of Care (Signed)
Immediate Anesthesia Transfer of Care Note  Patient: Emerson Monte Fusilier  Procedure(s) Performed: RIGHT SUBCLAVIAN-CAROTID TRANSPOSITION (Right: Chest) THORACIC AORTIC ENDOVASCULAR STENT GRAFT USING ZENITH ALPHA X STENT (COOK) (Bilateral: Groin) INSERTION OF LEFT ILIAC STENT USING VIABAHN VBX X 54. STENT (Left: Groin) ULTRASOUND GUIDANCE FOR VASCULAR ACCESS (Bilateral: Groin)  Patient Location: PACU  Anesthesia Type:General  Level of Consciousness: drowsy and patient cooperative  Airway & Oxygen Therapy: Patient Spontanous Breathing and Patient connected to face mask oxygen  Post-op Assessment: Report given to RN, Post -op Vital signs reviewed and stable, and Patient moving all extremities X 4  Post vital signs: Reviewed and stable  Last Vitals:  Vitals Value Taken Time  BP 140/53   Temp    Pulse 86 03/11/23 1145  Resp 12   SpO2 97 % 03/11/23 1145  Vitals shown include unvalidated device data.  Last Pain:  Vitals:   03/11/23 0600  TempSrc:   PainSc: 0-No pain      Patients Stated Pain Goal: 0 (03/11/23 0600)  Complications: No notable events documented.

## 2023-03-11 NOTE — Progress Notes (Signed)
  Day of Surgery Note    Subjective:  no complaints   Vitals:   03/11/23 1315 03/11/23 1330  BP: 95/62 (!) 105/57  Pulse: 71 74  Resp: 20 17  Temp:    SpO2: 97% 98%    Incisions:   bilateral groins are soft; right chest incision is clean and dry Extremities:  palpable right radial pulse and left DP pulse with monophasic doppler flow right DP/PT and brisk left DP/PT doppler flow Cardiac:  regular Lungs:  non labored Abdomen:  soft   Assessment/Plan:  This is a 82 y.o. male who is s/p  Transposition of right SCA to right CCA with ligation of Kommerell Diverticulum,  thoracic aortic endovascular stent graft, stent of left CIA and EIA  -pt doing well in recovery with palpable right radial pulse, doppler flow bilateral feet with left>right (palpable left DP) -bilateral groins and right chest wall incisions are soft -to [address] PA-C 03/11/2023 1:57 PM [phone]

## 2023-03-11 NOTE — Interval H&P Note (Signed)
History and Physical Interval Note:  03/11/2023 7:12 AM  Brian Dunn  has presented today for surgery, with the diagnosis of Kommerell's diverticulum, symptomatic.  The various methods of treatment have been discussed with the patient and family. After consideration of risks, benefits and other options for treatment, the patient has consented to  Procedure(s): RIGHT SUBCLAVIAN-CAROTID TRANSPOSITION (Right) THORACIC AORTIC ENDOVASCULAR STENT GRAFT (COOK) (N/A) as a surgical intervention.  The patient's history has been reviewed, patient examined, no change in status, stable for surgery.  I have reviewed the patient's chart and labs.  Questions were answered to the patient's satisfaction.     Lemar Livings

## 2023-03-11 NOTE — Progress Notes (Signed)
Pt BP via arterial line 84/55 (MAP 65), consistent with cuff pressure.  No distress noted to pt.  Crepitus appears to be decreasing below right chest incision.  Dr Randie Heinz notified.  Orders received.

## 2023-03-11 NOTE — Anesthesia Procedure Notes (Signed)
Procedure Name: Intubation Date/Time: 03/11/2023 7:45 AM  Performed by: Gus Puma, CRNAPre-anesthesia Checklist: Patient identified, Emergency Drugs available, Suction available and Patient being monitored Patient Re-evaluated:Patient Re-evaluated prior to induction Oxygen Delivery Method: Circle System Utilized Preoxygenation: Pre-oxygenation with 100% oxygen Induction Type: IV induction Ventilation: Mask ventilation without difficulty Laryngoscope Size: Mac and 4 Grade View: Grade I Tube type: Oral Tube size: 7.5 mm Number of attempts: 1 Airway Equipment and Method: Stylet and Oral airway Placement Confirmation: ETT inserted through vocal cords under direct vision, positive ETCO2 and breath sounds checked- equal and bilateral Secured at: 22 cm Tube secured with: Tape Dental Injury: Teeth and Oropharynx as per pre-operative assessment

## 2023-03-11 NOTE — Progress Notes (Signed)
PHARMACY NOTE:  ANTIMICROBIAL RENAL DOSAGE ADJUSTMENT  Current antimicrobial regimen includes a mismatch between antimicrobial dosage and estimated renal function.  As per policy approved by the Pharmacy & Therapeutics and Medical Executive Committees, the antimicrobial dosage will be adjusted accordingly.  Current antimicrobial dosage:  Vancomycin 1gm IV q12h x 2 doses post-op  Indication: surgical prophyalxis  Renal Function:  Estimated Creatinine Clearance: 39.5 mL/min (by C-G formula based on SCr of 1.22 mg/dL). []      On intermittent HD, scheduled: []      On CRRT    Antimicrobial dosage has been changed to:    - Vancomycin 1gm IV q24h x 1 dose post-op  Thank you for allowing pharmacy to be a part of this patient's care.  Dennie Fetters, [facility] 03/11/2023 3:29 PM

## 2023-03-11 NOTE — Op Note (Signed)
Patient name: Brian Dunn MRN: [id number] DOB: [birth year] Sex: male  03/11/2023 Pre-operative Diagnosis: Symptomatic Kommerell diverticulum Post-operative diagnosis:  Same Surgeon:  Apolinar Junes C. Randie Heinz, MD Assistants: Gillis Santa, MD; Nathanial Rancher, [place] Procedure Performed: 1.  Transposition of right subclavian artery to right common carotid artery with ligation of Kommerell diverticulum  2.  Thoracic aorta endovascular grafting with 38 x 117 mm Cook Zenith alpha graft with catheter in the ascending aorta and aortogram 3.  Stent of left common and external iliac arteries with 11 x 59 mm VBX 4.  Percutaneous access and closure bilateral common femoral arteries   Indications: 82 year old male with a symptomatic Kommerell diverticulum has been evaluated in the office and plan for right carotid to subclavian artery transposition and thoracic endograft to decompress the diverticular aneurysm.  Experience assistants were necessary to facilitate exposure of the subclavian artery as well as its branches and the common carotid artery and to place large-bore access into the right common femoral artery and on the left side to facilitate passage of catheters and wires.  Findings: Subclavian artery itself was significantly diseased and the Kommerell diverticulum was heavily calcified.  The vertebral artery and the right mammary artery were preserved but the right thyrocervical trunk was sacrificed and ultimately oversewn.  The common and external iliac arteries bilaterally were heavily calcified I was able to easily place the alpha graft of the right side but on the left side we had likely dissection at the takeoff of the hypogastric artery and ultimately this was stented at completion.  To allow flow into the left common femoral artery.  At completion the patient had a palpable right radial pulse and strong signals of both feet and was neurologically intact upon awakening from anesthesia.   Procedure:  The  patient was identified in the holding area and taken to the operating room where he was placed supine on the knee operative table and general anesthesia was induced.  He was then sterilely prepped and draped in the right neck and chest as well as the bilateral groins and abdomen in the usual fashion, antibiotics were administered a timeout was called.  Transverse incision was made 1 cm above the right clavicle extending to the lateral border of the clavicular head of the sternocleidomastoid.  Ultimately the clavicular head of the sternocleidomastoid was divided and the fat pad was identified and retracted cephalad and laterally using cautery.  The peroneal nerve was identified and protected and the underlying anterior scalene muscle was divided which exposed the underlying subclavian artery.  We then dissected approximately subclavian artery back to the level of the aorta using blunt and sharp dissection.  The esophagus was identified and we are able to easily identify the left lung as well and the pleura was not violated.  We then isolated the proximal subclavian artery circling this with vessel loop as well as the vertebral artery, the thyrocervical trunk and the right mammary artery.  The internal jugular vein was then identified and retracted cephalad and medial and the vagus nerve was identified and protected and the common carotid artery was dissected and encircled with vessel loop.  Attention was then turned to the bilateral groins.  We first accessed the left common femoral artery using percutaneous access with micropuncture needle followed by wire and the sheath.  We then placed a Bentson wire followed by a 5 [place] sheath.  I initially could not traverse the common iliac artery on the left used a Glidewire advantage and  was able to get a Kumpe catheter up higher and placed initially an Amplatz wire followed by quick cross catheter.  Attention was then turned to the right side where we made a transverse  incision and then bluntly dissected with a hemostat down to the common femoral artery and cannulated this with a micropuncture needle followed by wire and sheath and then dilated with an 8 French dilator over a Bentson wire and then placed 2 Pro-glide devices.  We then serially dilated over a and Lunderquist wire and then placed the alpha graft and the patient was fully heparinized at this time.  I was able to easily get the outflow graft to pass up to the level of the transverse aortic arch.  On the left side I really struggled with getting any catheters and wires to go.  Ultimately we placed a Lunderquist wire from the left as well and placed a long 6 French sheath up into the aortic arch and then was able to place a pigtail catheter.  Arch aortogram was performed.  We identified our branch vessels and then deployed the alpha graft just distal to the left subclavian artery to cover the Kommerell diverticulum which was easily identified.  This was then ballooned proximally and completion arch aortogram demonstrated delayed filling of the common rales without any true identifiable type I endoleak which was thought that with this would thrombosed after reversal of heparinization.  The alpha graft was then removed over the Lunderquist wire and exchanged for a 16 French sheath which was placed into the extrailiac artery on the right.  We then retracted the sheath from the left side which was a 6 [place] by 55 mm sheath and perform retrograde angiography and unfortunately this did not demonstrate any filling of the left common femoral artery.  The sheath was retracted further we performed retrograde angiography from the distal extrailiac artery and there was no identifiable flow.  From the right side we then performed an aortogram with the pigtail catheter and then primarily stented the common into the external neck artery on the left using an 11 x 59 mm VBX.  Prior to that we did exchanged for a 23 cm 8 French sheath.   Completion now demonstrated brisk flow to both common femoral arteries.  On the left side we then placed a Bentson wire remove the 8 French sheath and deployed a ProGlide device and placed a micropuncture sheath and performed angiography which demonstrated filling of the common femoral artery and the Pro-glide was fully deployed and hemostasis obtained.  On the right side we then removed our sheath deployed our Pro-glide devices again placed a micropuncture sheath perform common femoral angiography which demonstrated patency and then remove the sheath and fully deployed a ProGlide devices.  Hemostats were placed to provide external tension and there were strong signals of both anterior tibial and posterior tibial arteries at the ankle.  Satisfied with this we returned to the neck.  I first clamped the subclavian artery down to the level of the common rales and clamped the subclavian artery more distally and cinched down on our Vesseloops on the vertebral, mammary and thyrocervical trunk.  The swing artery was then transected and I oversewed the Kommerell diverticulum with a running 5-0 Prolene suture in a mattress fashion.  Unfortunately during this time there was a noted injury to the thyrocervical trunk and this was tied off and transected.  Ultimately I had to place a pledgeted 6-0 Prolene suture and I used part  of the transected subclavian artery as the pledget.  I then clamped the common carotid artery distally and proximally and first we confirmed the ACT was 260 and a mean arterial pressure greater than 90.  The side of the common carotid artery was then opened and I trimmed part of this away and then sewed the subclavian artery without tension into side with 5-0 Prolene suture.  Prior completion without flushing all directions.  Upon completion I did not place 1 repair stitch at the most inferior aspect of our anastomosis.  There was strong signals in the common carotid artery distal to our anastomosis as  well as strong pulsatility and there was also strong signal in the distal subclavian artery.  We administered 50 mg of protamine and diligently obtain hemostasis.  The wound was filled with saline and a breath-hold was performed and there was no leakage or air bubbles to suggest no violation of the pleura.  Satisfied with this we then closed our fat pad back over our repair protecting the phrenic nerve and then closed the platysma with running Vicryl suture and the skin with Monocryl and Dermabond is placed at the skin level.  The patient was then awakened from anesthesia having tolerated the procedure without immediate complication.  All counts were correct at completion.  Contrast: 125 cc  EBL: 400 cc  Santhiago Collingsworth C. Randie Heinz, MD [facility] Office: [phone] Pager: [phone]

## 2023-03-12 ENCOUNTER — Encounter (HOSPITAL_COMMUNITY): Payer: Self-pay | Admitting: Vascular Surgery

## 2023-03-12 ENCOUNTER — Inpatient Hospital Stay (HOSPITAL_COMMUNITY): Payer: Medicare HMO

## 2023-03-12 LAB — BASIC METABOLIC PANEL
Anion gap: 8 (ref 5–15)
BUN: 15 mg/dL (ref 8–23)
CO2: 25 mmol/L (ref 22–32)
Calcium: 8.4 mg/dL — ABNORMAL LOW (ref 8.9–10.3)
Chloride: 98 mmol/L (ref 98–111)
Creatinine, Ser: 0.84 mg/dL (ref 0.61–1.24)
GFR, Estimated: >60 mL/min (ref >60)
Glucose, Bld: 108 mg/dL — ABNORMAL HIGH (ref 70–99)
Potassium: 3.8 mmol/L (ref 3.5–5.1)
Sodium: 131 mmol/L — ABNORMAL LOW (ref 135–145)

## 2023-03-12 LAB — CBC
HCT: 25.5 % — ABNORMAL LOW (ref 39.0–52.0)
Hemoglobin: 8.9 g/dL — ABNORMAL LOW (ref 13.0–17.0)
MCH: 33.6 pg (ref 26.0–34.0)
MCHC: 34.9 g/dL (ref 30.0–36.0)
MCV: 96.2 fL (ref 80.0–100.0)
Platelets: 159 10*3/uL (ref 150–400)
RBC: 2.65 MIL/uL — ABNORMAL LOW (ref 4.22–5.81)
RDW: 13.5 % (ref 11.5–15.5)
WBC: 7 10*3/uL (ref 4.0–10.5)
nRBC: 0 % (ref 0.0–0.2)

## 2023-03-12 NOTE — Anesthesia Postprocedure Evaluation (Signed)
Anesthesia Post Note  Patient: Brian Dunn  Procedure(s) Performed: RIGHT SUBCLAVIAN-CAROTID TRANSPOSITION (Right: Chest) THORACIC AORTIC ENDOVASCULAR STENT GRAFT USING ZENITH ALPHA X STENT (COOK) (Bilateral: Groin) INSERTION OF LEFT ILIAC STENT USING VIABAHN VBX X 54. STENT (Left: Groin) ULTRASOUND GUIDANCE FOR VASCULAR ACCESS (Bilateral: Groin)     Patient location during evaluation: PACU Anesthesia Type: General Level of consciousness: awake and alert Pain management: pain level controlled Vital Signs Assessment: post-procedure vital signs reviewed and stable Respiratory status: spontaneous breathing, nonlabored ventilation, respiratory function stable and patient connected to nasal cannula oxygen Cardiovascular status: blood pressure returned to baseline and stable Postop Assessment: no apparent nausea or vomiting Anesthetic complications: no   No notable events documented.  Last Vitals:  Vitals:   03/12/23 0600 03/12/23 0700  BP: 123/64 124/67  Pulse: 92 74  Resp:  20  Temp:  37.3 C  SpO2: 97% 95%    Last Pain:  Vitals:   03/12/23 0700  TempSrc: Oral  PainSc:                  Wasim Hurlbut S

## 2023-03-12 NOTE — Progress Notes (Signed)
SLP Cancellation Note  Patient Details Name: Brian Dunn MRN: [id number] DOB: [birth year]   Cancelled treatment:       Reason Eval/Treat Not Completed: Patient at procedure or test/unavailable (Pt currently with RN to receive meds and transport is waiting to take pt to radiology. SLP will follow up later.)  Nazir Hacker I. Vear Clock, MS, CCC-SLP Acute Rehabilitation Services Office number [phone]  Scheryl Marten 03/12/2023, 9:28 AM

## 2023-03-12 NOTE — Progress Notes (Addendum)
Progress Note    03/12/2023 8:40 AM 1 Day Post-Op  Subjective:  feels weak. Also feels central chest pain with movement and palpation since waking up from surgery    Vitals:   03/12/23 0600 03/12/23 0700  BP: 123/64 124/67  Pulse: 92 74  Resp:  20  Temp:  99.1 F (37.3 C)  SpO2: 97% 95%    Physical Exam: General:  no acute distress Cardiac:  regular Lungs:  nonlabored, on supplemental oxygen Incisions:  right sided neck incision intact with some bruising but soft. Bilateral groins soft. Extremities:  palpable radial pulses and left DP pulse. Brisk right DP/PT doppler signals. Can do a straight leg raise without issue. Sensation intact in BLE.  CBC    Component Value Date/Time   WBC 7.0 03/12/2023 0558   RBC 2.65 (L) 03/12/2023 0558   HGB 8.9 (L) 03/12/2023 0558   HGB 11.1 (L) 01/31/2022 0920   HCT 25.5 (L) 03/12/2023 0558   HCT 32.7 (L) 01/31/2022 0920   PLT 159 03/12/2023 0558   PLT 262 01/31/2022 0920   MCV 96.2 03/12/2023 0558   MCV 96 01/31/2022 0920   MCV 95 (A) 02/27/2021 0000   MCH 33.6 03/12/2023 0558   MCHC 34.9 03/12/2023 0558   RDW 13.5 03/12/2023 0558   RDW 11.8 01/31/2022 0920   LYMPHSABS 1.5 01/12/2020 1005   MONOABS 0.3 01/22/2007 0923   EOSABS 0.2 01/12/2020 1005   BASOSABS 0.0 01/12/2020 1005    BMET    Component Value Date/Time   NA 131 (L) 03/12/2023 0558   NA 136 02/07/2023 1007   K 3.8 03/12/2023 0558   CL 98 03/12/2023 0558   CO2 25 03/12/2023 0558   GLUCOSE 108 (H) 03/12/2023 0558   BUN 15 03/12/2023 0558   BUN 27 02/07/2023 1007   CREATININE 0.84 03/12/2023 0558   CREATININE 1.26 (H) 12/20/2022 0949   CALCIUM 8.4 (L) 03/12/2023 0558   GFRNONAA >60 03/12/2023 0558   GFRNONAA 57 (L) 12/20/2022 0949   GFRAA 76 11/07/2020 1442    INR    Component Value Date/Time   INR 1.2 03/11/2023 1742     Intake/Output Summary (Last 24 hours) at 03/12/2023 0840 Last data filed at 03/12/2023 0537 Gross per 24 hour  Intake 1752.89 ml   Output 1550 ml  Net 202.89 ml      Assessment/Plan:  82 y.o. male is 1 day post op, s/p: Transposition of right SCA to right CCA with ligation of Kommerell Diverticulum, thoracic aortic endovascular stent graft, stent of left CIA and EIA    -The patient feels very weak this morning. He reports some central chest wall pain since surgery. This feels particularly worse with palpation and movement. Chest xray pending -Right sided neck incision intact with some bruising. Soft to palpation. Bilateral groins soft to palpation -He has palpable and equal radial pulses. He has a palpable left DP and brisk right DP/PT doppler signals. Intact motor and sensation of BLE. Can perform a SLR without weakness -Hemoglobin stable at 8.9 -Okay for dysphagia 3 diet currently, will see how patient tolerates. Pending SLP eval -Activity as tolerated with assistance. Will order PT/OT evaluation   Loel Dubonnet, PA-C [facility] [phone] 03/12/2023 8:40 AM   I have independently interviewed and examined patient and agree with PA assessment and plan above.  Initiate diet and will have swallow evaluation.  Chest x-ray not checked after surgery yesterday but will check today also having centralized chest pain not  unexpected given the extent of dissection in the OR yesterday.  Ammy Lienhard C. Randie Heinz, MD [facility] [place] Office: [phone] Pager: [phone]

## 2023-03-12 NOTE — Progress Notes (Signed)
  Comparing BP differential both arm. We could rely on BP on left arm which is closer and more accurate to the A-line.    03/11/23 1900 03/11/23 1945  Vitals  BP (!) (S)  107/55 (!) (S)  136/59  MAP (mmHg) (S)  72 (S)  81  BP Location (S)  Right Arm (S)  Left Arm  BP Method Automatic Automatic  Patient Position (if appropriate) Sitting sitting  Pulse Rate 73 77  Pulse Rate Source Monitor Monitor  ECG Heart Rate 87 77  Resp 18 20   Pt is stable hemodynamically, afebrile. NSR with occasional PVCs on the monitor. Neuro check q 2 hr presents difficulty swallowing which is the issue prior this admission. Otherwise, Pt  has no neurological deficits.  SPO2 84-88% on room air at night. 3 LPM of O2 NCL is given. On day time, no issue with desat.   Left and right groins are negative for bleeding or hematoma.  Right upper chest and neck incision has mini-crepitus. The side of subcutaneous emphysema around his neck has not increased since day shift reported. Dr. Randie Heinz is aware. Pain is well tolerated.  IV dilaudid given for pain controlled. Pt is able to rest well overnight. We keep him NPO due to difficulty of swallowing and risk of aspiration.SLP evaluations is pending. We will continue to monitor.   Filiberto Pinks, RN

## 2023-03-12 NOTE — Evaluation (Signed)
Clinical/Bedside Swallow Evaluation Patient Details  Name: Brian Dunn MRN: [id number] Date of Birth: December 09, 1940  Today's Date: 03/12/2023 Time: SLP Start Time (ACUTE ONLY): 1109 SLP Stop Time (ACUTE ONLY): 1124 SLP Time Calculation (min) (ACUTE ONLY): 15 min  Past Medical History:  Past Medical History:  Diagnosis Date   Acute bronchitis 05/29/2012   Allergic rhinitis    Anemia in chronic kidney disease 02/27/2021   Aneurysm of right subclavian artery (HCC) 02/07/2023   Angina pectoris (HCC) 11/07/2020   Angina, class III (HCC) 11/07/2020   Asthma 06/18/2012   Followed in Pulmonary clinic/ [facility]/ [place]  - 01/09/2018  After extensive coaching inhaler device  effectiveness =   75% > continue symb 160 2bid and return for pfts in 3 months ? RA bronchiolitis?  - PFT's  04/23/2018  FEV1 2.84 (91 % ) ratio 73  p no % improvement from saba p symb 160 prior to study with DLCO  76 % corrects to 79  % for alv volume   - 04/23/2018  After extensive coaching   Benign essential hypertension 06/20/2016   Bilateral inguinal hernia without obstruction or gangrene 06/20/2016   CAD (coronary artery disease), native coronary artery 06/20/2016   Chest pain, unspecified    Chronic ischemic heart disease, unspecified 01/28/2022   Chronic kidney disease 01/28/2022   COPD (chronic obstructive pulmonary disease) (HCC)    Coronary artery disease involving native coronary artery of native heart with angina pectoris (HCC) 06/20/2016   Cough 03/25/2012   Followed in Pulmonary clinic/ [facility]/ [place]    - Trial off acei again  03/25/2012    - Sinus CT 01/24/11 There is mucosal thickening within the paranasal sinuses without  air-fluid levels. Neither ostiomeatal unit is patent.    Cough variant asthma 10/18/2010   Followed in Pulmonary clinic/ [facility]/ [place]  -HFA 75% p coaching 01/22/2011  -PFT's  02/04/2011 minimal airflow obst, nl dlco    Disorder of bone and cartilage 01/22/2007    Qualifier: Diagnosis of  By: Drue Novel MD, Nolon Rod.   Overview:  Overview:  Qualifier: Diagnosis of  By: Drue Novel MD, Jose E.   Dysphagia 12/27/2022   Onset ? Aug 2022 p febrile illness ? Asp pna   -  MBS 01/13/23   PO Diet Recommendation: Regular; Thin liquids (Level 0)  Liquid Administration via: Cup; Spoon; Straw  Supervision: Patient able to self-feed  Postural changes: -- (try reclining when sipping liquids)  Oral care recommendations: Oral care QID (4x/day); Oral care before ice   chips/water  Recommended consults: Consider ENT consultation   Encounter for fitting and adjustment of hearing aid 01/29/2022   Essential hypertension 11/06/2017   GERD 01/22/2007   Annotation: history of esophageal stricture Qualifier: Diagnosis of  By: Drue Novel MD, Nolon Rod.    GERD (gastroesophageal reflux disease)    Gout 01/28/2022   HIP FRACTURE 01/22/2007   Annotation: right-sided status post surgery Qualifier: Diagnosis of  By: Drue Novel MD, Nolon Rod.    Hip fracture (HCC)    Hyperlipidemia 06/20/2016   Hyperlipidemia with target LDL less than 70 06/20/2016   Hypertension    HYPOGONADISM 02/18/2007   Qualifier: Diagnosis of  By: Drue Novel MD, Nolon Rod.    Hypogonadism male    Low back pain 09/09/2013   Multiple lung nodules on CT 11/21/2015   CT [place] 10/17/15 mpns > 15 y since quit smoking > rec 12 m f/u as this is low risk > done 06/04/16 no change rec  recheck in 16m  - CT [place] 12/08/17 no change nodules > meets benign criteria > no directed f/u - Quantiferon GOLD TB 01/09/18 neg    Osteopenia    Personal history of malignant neoplasm of prostate    Postoperative visit 08/14/2016   Sensorineural hearing loss, bilateral 01/28/2022   Tremor, essential 02/18/2007   Overview:  Overview:  Qualifier: Diagnosis of  By: Drue Novel MD, Nolon Rod.  Last Assessment & Plan:  Reviewed tremor may increase on dulera so will need to be balanced against benefits to cough   Past Surgical History:  Past Surgical History:  Procedure Laterality Date    CAROTID-SUBCLAVIAN BYPASS GRAFT Right 03/11/2023   Procedure: RIGHT SUBCLAVIAN-CAROTID TRANSPOSITION;  Surgeon: Maeola Harman, MD;  Location: [facility] OR;  Service: Vascular;  Laterality: Right;   CORONARY ANGIOPLASTY WITH STENT PLACEMENT  11/04/2006   CORONARY STENT INTERVENTION N/A 11/16/2020   Procedure: CORONARY STENT INTERVENTION;  Surgeon: Marykay Lex, MD;  Location: [facility] INVASIVE CV LAB;  Service: Cardiovascular;  Laterality: N/A;   EYE SURGERY Bilateral 11/2022   cataract removal   INSERTION OF ILIAC STENT Left 03/11/2023   Procedure: INSERTION OF LEFT ILIAC STENT USING VIABAHN VBX X 54. STENT;  Surgeon: Maeola Harman, MD;  Location: [facility] OR;  Service: Vascular;  Laterality: Left;   INSERTION PROSTATE RADIATION SEED     LEFT HEART CATH AND CORONARY ANGIOGRAPHY N/A 11/16/2020   Procedure: LEFT HEART CATH AND CORONARY ANGIOGRAPHY;  Surgeon: Marykay Lex, MD;  Location: [facility] INVASIVE CV LAB;  Service: Cardiovascular;  Laterality: N/A;   THORACIC AORTIC ENDOVASCULAR STENT GRAFT Bilateral 03/11/2023   Procedure: THORACIC AORTIC ENDOVASCULAR STENT GRAFT USING ZENITH ALPHA X STENT (COOK);  Surgeon: Maeola Harman, MD;  Location: [facility] OR;  Service: Vascular;  Laterality: Bilateral;   ULTRASOUND GUIDANCE FOR VASCULAR ACCESS Bilateral 03/11/2023   Procedure: ULTRASOUND GUIDANCE FOR VASCULAR ACCESS;  Surgeon: Maeola Harman, MD;  Location: [facility] OR;  Service: Vascular;  Laterality: Bilateral;   HPI:  Pt is an 82 y/o male who presented for surgery, with the diagnosis of Kommerell's diverticulum. Pt s/p transposition of right SCA to right CCA with ligation of Kommerell Diverticulum, thoracic aortic endovascular stent graft, stent of left CIA and EIA 5/7.  PMH: vascular disease and CAD. MBS 01/13/23: lordosis of his cervical spine, thinning of the prevertebral space, and what appears to be hypertrophy in the mid-superior pharynx (presents similarly to CP  bar but located anatomically higher). Poor base-of-tongue to pharyngeal wall contact and the epiglottis is unable to invert past its horizontal position. There is good laryngeal elevation; the arytenoids never fully meet the epiglottic petiole, minimal distension of the PES. Factors led to significant residue that accumulates in the pharynx, regardess of consistency, and is aspirated either during or after the swallow response. A regular texture diet with thin liquids was recommended with observance of swallowing precautions considering the severity of symptoms across consistencies. ENT referral at [facility] and outpatient SLP services recommended.    Assessment / Plan / Recommendation  Clinical Impression  Pt was seen for bedside swallow evaluation. He reported that he is scheduled  to see an ENT at [facility] in June and has been avoiding meats and tougher foods due to dysphagia. Pt stated that he believes his swallowing may be acutely worse with a slight increase in coughing with p.o. Pt presented with symptoms of pharyngeal dysphagia characterized by multiple swallows and throat clearing and/or coughing across consistencies. Pt's  performance appears consistent with the findings of the modified barium swallow study from 01/13/23 with symptoms suggesting pharyngeal residue and aspiration. Pt's current diet of dysphagia 3 solids and thin liquids will be continued with observance of swallowing precautions. SLP will follow pt. SLP Visit Diagnosis: Dysphagia, pharyngeal phase (R13.13)    Aspiration Risk  Risk for inadequate nutrition/hydration;Moderate aspiration risk;Severe aspiration risk    Diet Recommendation Dysphagia 3 (Mech soft);Thin liquid   Liquid Administration via: Cup;Straw Medication Administration: Whole meds with puree Supervision: Patient able to self feed;Intermittent supervision to cue for compensatory strategies Compensations: Slow rate;Small sips/bites;Follow solids with liquid Postural  Changes:  (Consider slight recline to assist with pharyngeal clearance of thin liquids)    Other  Recommendations Oral Care Recommendations: Oral care QID    Recommendations for follow up therapy are one component of a multi-disciplinary discharge planning process, led by the attending physician.  Recommendations may be updated based on patient status, additional functional criteria and insurance authorization.  Follow up Recommendations Outpatient SLP      Assistance Recommended at Discharge    Functional Status Assessment Patient has not had a recent decline in their functional status  Frequency and Duration min 2x/week  2 weeks       Prognosis Prognosis for improved oropharyngeal function: Fair Barriers to Reach Goals: Severity of deficits      Swallow Study   General Date of Onset: 01/13/23 HPI: Pt is an 82 y/o male who presented for surgery, with the diagnosis of Kommerell's diverticulum. Pt s/p transposition of right SCA to right CCA with ligation of Kommerell Diverticulum, thoracic aortic endovascular stent graft, stent of left CIA and EIA 5/7.  PMH: vascular disease and CAD. MBS 01/13/23: lordosis of his cervical spine, thinning of the prevertebral space, and what appears to be hypertrophy in the mid-superior pharynx (presents similarly to CP bar but located anatomically higher). Poor base-of-tongue to pharyngeal wall contact and the epiglottis is unable to invert past its horizontal position. There is good laryngeal elevation; the arytenoids never fully meet the epiglottic petiole, minimal distension of the PES. Factors led to significant residue that accumulates in the pharynx, regardess of consistency, and is aspirated either during or after the swallow response. A regular texture diet with thin liquids was recommended with observance of swallowing precautions considering the severity of symptoms across consistencies. E[facility] referral at [facility] and outpatient SLP services  recommended. Type of Study: Bedside Swallow Evaluation Previous Swallow Assessment: See HPI Diet Prior to this Study: Dysphagia 3 (mechanical soft);Thin liquids (Level 0) Temperature Spikes Noted: No Respiratory Status: Room air History of Recent Intubation: No Behavior/Cognition: Alert;Pleasant mood;Cooperative Oral Cavity Assessment: Within Functional Limits Oral Care Completed by SLP: No Oral Cavity - Dentition: Missing dentition Vision: Functional for self-feeding Self-Feeding Abilities: Able to feed self Patient Positioning: Upright in bed;Postural control adequate for testing Baseline Vocal Quality: Normal Volitional Cough: Weak Volitional Swallow: Able to elicit    Oral/Motor/Sensory Function Overall Oral Motor/Sensory Function: Within functional limits   Ice Chips Ice chips: Impaired Presentation: Spoon Pharyngeal Phase Impairments: Multiple swallows;Throat Clearing - Immediate;Cough - Immediate;Cough - Delayed   Thin Liquid Thin Liquid: Impaired Presentation: Cup Pharyngeal  Phase Impairments: Multiple swallows;Throat Clearing - Immediate;Cough - Immediate    Nectar Thick Nectar Thick Liquid: Not tested   Honey Thick Honey Thick Liquid: Not tested   Puree Puree: Impaired Presentation: Spoon Pharyngeal Phase Impairments: Multiple swallows;Cough - Immediate;Cough - Delayed;Throat Clearing - Delayed   Solid     Solid:  Impaired Presentation: Self Fed Pharyngeal Phase Impairments: Cough - Immediate;Cough - Delayed;Throat Clearing - Delayed     Brian Dunn I. Vear Clock, MS, CCC-SLP Neuro Diagnostic Specialist  Acute Rehabilitation Services Office number: [phone]  Scheryl Marten 03/12/2023,12:27 PM

## 2023-03-13 MED ORDER — ENSURE ENLIVE PO LIQD
237.0000 mL | Freq: Two times a day (BID) | ORAL | Status: DC
  Administered 2023-03-14: 237 mL via ORAL

## 2023-03-13 NOTE — Progress Notes (Signed)
Initial Nutrition Assessment  DOCUMENTATION CODES:   Severe malnutrition in context of chronic illness  INTERVENTION:  Pt currently ordered for dysphagia 1 diet with honey thick liquids per SLP recommendations.  Provide Ensure Enlive po BID, each supplement provides 350 kcal and 20 grams of protein. Please thicken to appropriate consistency per SLP recommendations.  Provide Magic cup TID with meals, each supplement provides 290 kcal and 9 grams of protein.  Continue multivitamin with minerals po daily.  Provided "High Calorie, High Protein Nutrition Therapy" handout from the Academy of Nutrition and Dietetics. Discussed strategies for increasing intake of calories and protein. Discussed foods high in calories and protein pt may tolerate best.  Recommend obtaining measured weight.  NUTRITION DIAGNOSIS:   Severe Malnutrition related to chronic illness (COPD, also suspect concerns over difficulty swallowing impacting intake) as evidenced by severe fat depletion, severe muscle depletion.  GOAL:   Patient will meet greater than or equal to 90% of their needs  MONITOR:   PO intake, Supplement acceptance, Diet advancement, Labs, Weight trends, I & O's  REASON FOR ASSESSMENT:   Malnutrition Screening Tool    ASSESSMENT:   82 year old male with PMHx of COPD, GERD, HTN, CAD now who presented for surgery with diagnosis of Kommerell's diverticulum s/p transposition of right SCA to right CCA with ligation of Kommerell Diverticulum, thoracic aortic endovascular stent graft, stent of left CIA and EIA on 03/11/23.  5/8: diet advanced to dysphagia 3 with thin liquids following SLP evaluation 5/9: diet downgraded to dysphagia 1 with honey thick liquids following SLP evaluation with plan for MBS  Met with pt at bedside. He reports he has had decreased appetite and intake this admission due to worsening of difficulty with swallowing. He had two puddings at breakfast. He reports typically eating  3 meals per day. For breakfast he has cereal or bagel or Poptart. For lunch he has ham or [place] sandwich with chips. For dinner he and his wife have a lighter meal as he is less hungry. Pt reports struggling with difficulty swallowing for a while now at baseline and this impacts his intake. He prefers softer and more moist foods for ease of swallowing. He also reports drinking Boost Very High Calorie one carton daily (530 kcal and 22 grams of protein per serving). Pt denies food allergies or intolerances. He denies nausea, emesis, or abdominal pain. Pt requesting Ensure to drink here. As pt now ordered for honey thick liquids, recommend thickening to appropriate consistency. He would also like to try Magic Cup with meals to help improve intake. Provided pt with handout on high calorie, high protein nutrition therapy and discussed other strategies for increasing intake of calories and protein at home.  Pt reports his UBW was 175 lbs and that he has lost weight over many years. Per review of weight history in chart pt was 64.2 kg on 01/29/22. He was documented to be 59.8 kg on 03/07/23 (and also 03/11/23, so unsure if truly measured or pulled forward). He has lost 4.4 kg or 6.9% weight, which is not significant for time frame.  Medications reviewed and include: allopurinol, Colace 100 mg daily, famotidine, MVI daily, pantoprazole, Flomax, NS at 75 mL/hour  Labs reviewed: On 5/8 Sodium 131  UOP: 150 mL (0.1 mL/kg/hr) in previous 24 hours  I/O: +1854.1 mL since admission  NUTRITION - FOCUSED PHYSICAL EXAM:  Flowsheet Row Most Recent Value  Orbital Region Severe depletion  Upper Arm Region Severe depletion  Thoracic and Lumbar Region  Severe depletion  Buccal Region Severe depletion  Temple Region Severe depletion  Clavicle Bone Region Severe depletion  Clavicle and Acromion Bone Region Severe depletion  Scapular Bone Region Severe depletion  Dorsal Hand Severe depletion  Patellar Region Severe  depletion  Anterior Thigh Region Severe depletion  Posterior Calf Region Severe depletion  Edema (RD Assessment) Mild  [swelling to left hand at time of RD assessment]  Hair Reviewed  Eyes Reviewed  Mouth Reviewed  Skin Reviewed  Nails Reviewed      Diet Order:   Diet Order             DIET - DYS 1 Room service appropriate? Yes with Assist; Fluid consistency: Honey Thick  Diet effective now                  EDUCATION NEEDS:   Education needs have been addressed  Skin:  Skin Assessment: Skin Integrity Issues: Skin Integrity Issues:: Incisions Incisions: closed incisions to right chest and groin  Last BM:  03/11/23 per chart  Height:   Ht Readings from Last 1 Encounters:  03/11/23 5\' 9"  (1.753 m)   Weight:   Wt Readings from Last 1 Encounters:  03/11/23 59.8 kg   Ideal Body Weight:  72.7 kg  BMI:  Body mass index is 19.48 kg/m.  Estimated Nutritional Needs:   Kcal:  1700-1900  Protein:  85-95 grams  Fluid:  1.5-1.8 L/day  Letta Median, MS, RD, LDN, CNSC Pager number available on Amion

## 2023-03-13 NOTE — Progress Notes (Signed)
Speech Language Pathology Treatment: Dysphagia  Patient Details Name: Brian Dunn MRN: [id number] DOB: [birth year] Today's Date: 03/13/2023 Time: 4098-1191 SLP Time Calculation (min) (ACUTE ONLY): 33 min  Assessment / Plan / Recommendation Clinical Impression  Per chart, pt's swallowing has been more difficult today with increased coughing across all intake.  Spoke at length wit pt, his wife and sister-in-law.  He reports increased frequency/intensity of coughing with POs.  Per Dr. Darcella Dunn note this am, given the extent of dissection and inflammation, his swallowing difficulties are not unexpected.  Observation of PO consumption revealed significant coughing, in particular with thin liquids. Purees and honey-thick improved pt's comfort and coughing was subjectively more infrequent.   We discussed a plan for recovery. I offered encouragement that the etiology of Brian Dunn' severe dysphagia was identified and addressed.  We discussed the importance of minimizing aspiration during these first post-surgical days while his functional reserve is low. He may need several days/weeks before he sees improvement.  Recommend proceeding with an MBS next date to determine physiology of swallow during acute recovery phase.  In the interim, we agreed on a dysphagia 1 diet with honey thick liquids. We discussed the possibility of needing a cortrak to bridge this period of time if results of MBS reveal severe dysfunction.  I emphasized the temporary nature of these interventions and offered encouragement. Mr. and Brian Dunn agree with plan.   HPI HPI: Pt is an 82 y/o male who presented for surgery, with the diagnosis of Kommerell's diverticulum. Pt s/p transposition of right SCA to right CCA with ligation of Kommerell Diverticulum, thoracic aortic endovascular stent graft, stent of left CIA and EIA 5/7.  PMH: vascular disease and CAD. MBS 01/13/23: lordosis of his cervical spine, thinning of the prevertebral space,  and what appears to be hypertrophy in the mid-superior pharynx (presents similarly to CP bar but located anatomically higher). Poor base-of-tongue to pharyngeal wall contact and the epiglottis is unable to invert past its horizontal position. There is good laryngeal elevation; the arytenoids never fully meet the epiglottic petiole, minimal distension of the PES. Factors led to significant residue that accumulates in the pharynx, regardess of consistency, and is aspirated either during or after the swallow response. A regular texture diet with thin liquids was recommended with observance of swallowing precautions considering the severity of symptoms across consistencies. ENT referral at [facility] and outpatient SLP services recommended. Per Dr. Randie Dunn,  CT scan appeared to show the diverticulum to be compressing the esophagus against the trachea.  He discussed with the patient and his family at bedside that this is very likely at least a contributing factor to his dysphagia given the large size of the diverticulum relative to the small area where it existed.      SLP Plan  MBS      Recommendations for follow up therapy are one component of a multi-disciplinary discharge planning process, led by the attending physician.  Recommendations may be updated based on patient status, additional functional criteria and insurance authorization.    Recommendations  Diet recommendations: Dysphagia 1 (puree);Honey-thick liquid Liquids provided via: Cup;Straw Medication Administration: Whole meds with puree Supervision: Patient able to self feed Compensations: Slow rate;Small sips/bites;Follow solids with liquid Postural Changes and/or Swallow Maneuvers: Seated upright 90 degrees                  Oral care BID   Intermittent Supervision/Assistance Dysphagia, pharyngoesophageal phase (R13.14)     MBS    Brian Dunn L. Brian Day,  MA CCC/SLP Clinical Specialist - Acute Care SLP Acute Rehabilitation  Services Office number [phone]  Brian Dunn Brian Dunn  03/13/2023, 3:59 PM

## 2023-03-13 NOTE — Progress Notes (Signed)
Pt is hemodynamically stable, afebrile, alert and oriented x 4, no acute distress noted over night. SPO2 90 % on room air. 3 LPM of O2 NCL is given. Pt has productive cough with white sputum. Oral suction is provided. Lung sound has positive rhonchi bilaterally, but clear after coughing. No wheezes, stridor or crackles on auscultations.  He has complaints of difficulties swallowing and poor toleration with crushed pills in puree apple sauce.  We recommend to crush pills and mix with thin liquid which he is able to swallow more smoothly.   He also has a complaint of chest pain on central area. Pain is tolerated better and able to rest after Dilaudid given. His right neck incision and left and right groins are negative for bleeding or hematoma. Doppler good pulse signals on PD and PT bilaterally. We will continue to monitor.   Filiberto Pinks, RN

## 2023-03-13 NOTE — Progress Notes (Addendum)
Progress Note    03/13/2023 7:57 AM 2 Days Post-Op  Subjective:  still some chest pain but improving. Says he was able to drink water and thin liquids prior to surgery but feels like he cannot tolerate now    Vitals:   03/13/23 0256 03/13/23 0400  BP: (!) 153/62 (!) 122/55  Pulse: 83 92  Resp: 18   Temp: 99 F (37.2 C)   SpO2: 94% 96%   Physical Exam: Cardiac:  regular Lungs:  non labored Incisions:  right infraclavicular incision is clean, dry and intact. Mild ecchymosis present. B CF access sites c/d/I without swelling or hematoma Extremities:  palpable right radial pulse, BLE well perfused and warm with palpable DP pulses Abdomen:  soft Neurologic: alert and oriented   CBC    Component Value Date/Time   WBC 7.0 03/12/2023 0558   RBC 2.65 (L) 03/12/2023 0558   HGB 8.9 (L) 03/12/2023 0558   HGB 11.1 (L) 01/31/2022 0920   HCT 25.5 (L) 03/12/2023 0558   HCT 32.7 (L) 01/31/2022 0920   PLT 159 03/12/2023 0558   PLT 262 01/31/2022 0920   MCV 96.2 03/12/2023 0558   MCV 96 01/31/2022 0920   MCV 95 (A) 02/27/2021 0000   MCH 33.6 03/12/2023 0558   MCHC 34.9 03/12/2023 0558   RDW 13.5 03/12/2023 0558   RDW 11.8 01/31/2022 0920   LYMPHSABS 1.5 01/12/2020 1005   MONOABS 0.3 01/22/2007 0923   EOSABS 0.2 01/12/2020 1005   BASOSABS 0.0 01/12/2020 1005    BMET    Component Value Date/Time   NA 131 (L) 03/12/2023 0558   NA 136 02/07/2023 1007   K 3.8 03/12/2023 0558   CL 98 03/12/2023 0558   CO2 25 03/12/2023 0558   GLUCOSE 108 (H) 03/12/2023 0558   BUN 15 03/12/2023 0558   BUN 27 02/07/2023 1007   CREATININE 0.84 03/12/2023 0558   CREATININE 1.26 (H) 12/20/2022 0949   CALCIUM 8.4 (L) 03/12/2023 0558   GFRNONAA >60 03/12/2023 0558   GFRNONAA 57 (L) 12/20/2022 0949   GFRAA 76 11/07/2020 1442    INR    Component Value Date/Time   INR 1.2 03/11/2023 1742     Intake/Output Summary (Last 24 hours) at 03/13/2023 0758 Last data filed at 03/13/2023 3086 Gross per  24 hour  Intake 1551.17 ml  Output 150 ml  Net 1401.17 ml     Assessment/Plan:  82 y.o. male is s/p Transposition of right SCA to right CCA with ligation of Kommerell Diverticulum, thoracic aortic endovascular stent graft, stent of left CIA and EIA 2 Days Post-Op   Some improvement of chest pain since yesterday Chest xray with no acute findings. Encourage IS today Remains on [place] 3L  Right sided neck incision intact with bruising BLE well perfused and warm with palpable pulses Bilateral groin access sites clean, dry and intact without swelling or hematoma PT/OT to eval today Having more difficulty swallowing even thin liquids. Explained that with extent of dissection and inflammation this is not unexpected and will hopefully improve with time SLP recommending Dysphasia 3; thin liquids. Will need outpatient follow up    Graceann Congress, PA-C [facility] [phone] 03/13/2023 7:58 AM  I have independently interviewed and examined patient and agree with PA assessment and plan above.  Dysphagia not unexpected given the extent of dissection.  If he does not have improvement we will consider GI consultation prior to discharge.  Arthella Headings C. Randie Heinz, MD [facility]  [facility] Office: [phone] Pager: [phone]

## 2023-03-14 ENCOUNTER — Inpatient Hospital Stay (HOSPITAL_COMMUNITY): Payer: Medicare HMO

## 2023-03-14 DIAGNOSIS — E43 Unspecified severe protein-calorie malnutrition: Secondary | ICD-10-CM | POA: Insufficient documentation

## 2023-03-14 HISTORY — DX: Unspecified severe protein-calorie malnutrition: E43

## 2023-03-14 MED ORDER — FLEET ENEMA 7-19 GM/118ML RE ENEM
1.0000 | ENEMA | Freq: Once | RECTAL | Status: AC
  Administered 2023-03-14: 1 via RECTAL
  Filled 2023-03-14: qty 1

## 2023-03-14 NOTE — Progress Notes (Signed)
Modified Barium Swallow Study  Patient Details  Name: Brian Dunn MRN: [id number] Date of Birth: 1940/12/19  Today's Date: 03/14/2023  Modified Barium Swallow completed.  Full report located under Chart Review in the Imaging Section.  History of Present Illness Pt is an 82 y/o male who presented for surgery, with the diagnosis of Kommerell's diverticulum. Pt s/p transposition of right SCA to right CCA with ligation of Kommerell Diverticulum, thoracic aortic endovascular stent graft, stent of left CIA and EIA 5/7.  PMH: vascular disease and CAD. MBS 01/13/23: lordosis of his cervical spine, thinning of the prevertebral space, and what appears to be hypertrophy in the mid-superior pharynx (presents similarly to CP bar but located anatomically higher). Poor base-of-tongue to pharyngeal wall contact and the epiglottis is unable to invert past its horizontal position. There is good laryngeal elevation; the arytenoids never fully meet the epiglottic petiole, minimal distension of the PES. Factors led to significant residue that accumulates in the pharynx, regardess of consistency, and is aspirated either during or after the swallow response. A regular texture diet with thin liquids was recommended with observance of swallowing precautions considering the severity of symptoms across consistencies. ENT referral at [facility] and outpatient SLP services recommended. Per Dr. Randie Heinz,  CT scan appeared to show the diverticulum to be compressing the esophagus against the trachea.  He discussed with the patient and his family at bedside that this is very likely at least a contributing factor to his dysphagia given the large size of the diverticulum relative to the small area where it existed.   Clinical Impression Pt demonstrates some improvement since prior MBS, but dysphagia is still severe with high risk of aspiration. Study limited due to pt request to stop. Pt demonstrates good oral function, and very good  laryngeal elevation and glottic closure. Pt initiates laryngeal elevation, but hyoid excursion, epiglottic deflection and UES opening are particularly problematic. Pt can transit bolus, hold glottis closed and apply effort for partial propulsive movement. Pts curved epiglottis does not invert and large amounts of residue collect there. Ultimately, after releasing glottic closure pt had penetration of nectar and puree and aspiration of thin (did not test honey or chewable solids). Pt had some success with a 'stretch and tuck' posture to straighten cervical lordosis and facilitate bolus transit (trialed on sequence 10, 11, 12 though movement too subtle to see). Cues for effort also helpful. Overall, pt is at high risk of aspiration and likely has been aspirating for some time. The effort needed to eat is likely disproportionate to the nutrition recieved. Recommend NPO during acute recovery. Pt needs to initiate pharyngeal strengthening exercises as able with reassessment next week. MD agreed to placement of cortrak today. Factors that may increase risk of adverse event in presence of aspiration Rubye Oaks & Clearance Coots 2021):    Swallow Evaluation Recommendations Recommendations: NPO;Alternative means of nutrition - NG Tube Medication Administration: Via alternative means    Harlon Ditty, MA CCC-SLP  Acute Rehabilitation Services Secure Chat Preferred Office [phone]   Claudine Mouton 03/14/2023,3:03 PM

## 2023-03-14 NOTE — Progress Notes (Signed)
Brief Nutrition Note  Received an order to place a Cortrak feeding tube after MBS. Cortrak team unable to place tube, attempted by two placers. MD notified via secure chat.  If plan to proceed with feeding tube placement, consider consult to diagnostic radiology for feeding tube placement under fluoroscopy.   If able to obtain enteral nutrition access:  - Start Osmolite 1.5 at 20 mL/hr, advance by 10 mL every 12 hours to goal rate of 45 mL/hr. (1080 mL per day) - 60 mL ProSource TF20 - daily  - Free water flush: 145 mL q4h - Tube feeds at goal provides 1700 kcal, 88 gm protein, 1693 mL total free water daily.   Recommend monitoring magnesium, potassium, and phosphorus BID for at least 3 days, MD to replete as needed, as pt is at risk for refeeding syndrome given severe malnutrition and decreased PO intake this admission.   RD team will continue to follow during admission.  Kirby Crigler RD, LDN Clinical Dietitian See Loretha Stapler for contact information.

## 2023-03-14 NOTE — Evaluation (Signed)
Physical Therapy Evaluation Patient Details Name: Brian Dunn MRN: [id number] DOB: [birth year] Today's Date: 03/14/2023  History of Present Illness  82 year old male with the diagnosis of Kommerell's diverticulum. s/p 5/7 transposition of right SCA to right CCA with ligation of Kommerell Diverticulum, thoracic aortic endovascular stent graft, stent of left CIA and EIA .PMH: CAD, COPD, CKD, Dysphasgia, Essential HTN, Osteopenia,  Clinical Impression  PTA pt living with wife in single story home with 3 steps to enter. Pt reports independence with community level ambulation, does lean on grocery cart to get around store, driving. Pt reports independence with ADLs and iADLs. Pt currently limited in safe mobility by generalized weakness, and balance deficits. Pt is currently mod I for transfers and supervision for ambulation with RW. PT will continue to follow pt acutely and will refer to Mobility Specialist to maximize mobilization.      Recommendations for follow up therapy are one component of a multi-disciplinary discharge planning process, led by the attending physician.  Recommendations may be updated based on patient status, additional functional criteria and insurance authorization.  Assistance Recommended at Discharge Frequent or constant Supervision/Assistance  Patient can return home with the following  A little help with walking and/or transfers;A little help with bathing/dressing/bathroom;Assistance with cooking/housework;Assist for transportation;Help with stairs or ramp for entrance    Equipment Recommendations Rolling walker (2 wheels)     Functional Status Assessment Patient has had a recent decline in their functional status and demonstrates the ability to make significant improvements in function in a reasonable and predictable amount of time.     Precautions / Restrictions Precautions Precautions: Other (comment) (dysphasia diet) Restrictions Weight Bearing Restrictions:  No      Mobility  Bed Mobility               General bed mobility comments: in bathroom on entry    Transfers Overall transfer level: Modified independent Equipment used:  (handrail in bathroom)               General transfer comment: uses handrail in bathroom to assist in powering up to RW    Ambulation/Gait Ambulation/Gait assistance: Supervision Gait Distance (Feet): 12 Feet Assistive device: Rolling walker (2 wheels) Gait Pattern/deviations: Step-through pattern, Decreased step length - right, Decreased step length - left, Shuffle, Trunk flexed Gait velocity: slowed Gait velocity interpretation: <1.31 ft/sec, indicative of household ambulator   General Gait Details: supervision for safety, and management of IV lines      Balance Overall balance assessment: Mild deficits observed, not formally tested                                           Pertinent Vitals/Pain Pain Assessment Pain Assessment: Faces Faces Pain Scale: No hurt    Home Living Family/patient expects to be discharged to:: Private residence Living Arrangements: Spouse/significant other Available Help at Discharge: Family;Available 24 hours/day Type of Home: House Home Access: Stairs to enter   [employer] of Steps: 3   Home Layout: One level Home Equipment: Grab bars - tub/shower      Prior Function Prior Level of Function : Independent/Modified Independent;Driving             Mobility Comments: ambulating limited community distances without AD, pushes cart in grocery store for support, drives ADLs Comments: independent        Extremity/Trunk Assessment   Upper Extremity  Assessment Upper Extremity Assessment: Defer to OT evaluation    Lower Extremity Assessment Lower Extremity Assessment: Overall WFL for tasks assessed;Generalized weakness (pt reports feeling stronger today)    Cervical / Trunk Assessment Cervical / Trunk Assessment:  Kyphotic  Communication   Communication: HOH (bilateral hearing aides)  Cognition Arousal/Alertness: Awake/alert Behavior During Therapy: WFL for tasks assessed/performed Overall Cognitive Status: Within Functional Limits for tasks assessed                                          General Comments General comments (skin integrity, edema, etc.): VSS on RA, insicion site clean, dry and intact        Assessment/Plan    PT Assessment Patient needs continued PT services  PT Problem List Decreased activity tolerance;Decreased mobility;Decreased skin integrity       PT Treatment Interventions DME instruction;Gait training;Stair training;Functional mobility training;Therapeutic activities;Therapeutic exercise;Balance training;Cognitive remediation;Patient/family education    PT Goals (Current goals can be found in the Care Plan section)  Acute Rehab PT Goals Patient Stated Goal: get home PT Goal Formulation: With patient Time For Goal Achievement: 03/28/23 Potential to Achieve Goals: Good    Frequency Min 1X/week        AM-PAC PT "6 Clicks" Mobility  Outcome Measure Help needed turning from your back to your side while in a flat bed without using bedrails?: None Help needed moving from lying on your back to sitting on the side of a flat bed without using bedrails?: None Help needed moving to and from a bed to a chair (including a wheelchair)?: None Help needed standing up from a chair using your arms (e.g., wheelchair or bedside chair)?: A Little Help needed to walk in hospital room?: A Little Help needed climbing 3-5 steps with a railing? : A Little 6 Click Score: 21    End of Session   Activity Tolerance: Patient tolerated treatment well Patient left: with nursing/sitter in room (sitting EoB, NT came into room to give bath) Nurse Communication: Mobility status;Other (comment) (request for water, asked to provide appropriate thickness) PT Visit Diagnosis:  Muscle weakness (generalized) (M62.81);Difficulty in walking, not elsewhere classified (R26.2)    Time: 1610-9604 PT Time Calculation (min) (ACUTE ONLY): 15 min   Charges:   PT Evaluation $PT Eval Moderate Complexity: 1 Mod          Lydiah Pong B. Beverely Risen PT, DPT Acute Rehabilitation Services Please use secure chat or  Call Office [phone]   Elon Alas [facility] 03/14/2023, 8:35 AM

## 2023-03-14 NOTE — Care Management Important Message (Signed)
Important Message  Patient Details  Name: Brian Dunn MRN: [id number] Date of Birth: 1941/06/06   Medicare Important Message Given:  Yes     Renie Ora 03/14/2023, 10:23 AM

## 2023-03-14 NOTE — Progress Notes (Signed)
  Transition of Care [facility]) Screening Note   Patient Details  Name: Brian Dunn [facility] Date of Birth: 03-Sep-1941   Transition of Care [facility]) CM/SW Contact:    Darrold Span, RN Phone Number: 03/14/2023, 4:19 PM    Transition of Care Department [facility]) has reviewed patient and note pt has VSS office protocol referral for [facility] needs. We will continue to monitor patient advancement through interdisciplinary progression rounds. If new patient transition needs arise, please place a TOC consult.

## 2023-03-14 NOTE — Progress Notes (Addendum)
  Progress Note    03/14/2023 6:44 AM 3 Days Post-Op  Subjective:  says he feels a little bit better each day.  Feels his swallowing may be a little better.  Hasn't had a BM in 5 days.  Wants an enema.  Afebrile HR 60's-80's NSR 120's-150's systolic 91% RA  Vitals:   03/14/23 0305 03/14/23 0359  BP: 132/66 139/72  Pulse: 67 83  Resp: 16 18  Temp: 98.1 F (36.7 C) 98.4 F (36.9 C)  SpO2: 96% 96%    Physical Exam: General:  sleeping but wakes easily Cardiac:  regular Lungs:  non labored Incisions:  bilateral groins soft; right chest incision is clean with some ecchymosis.  No hematoma Extremities:  left DP palpable bilateral DP with brisk doppler flow.  Bilateral feet warm and well perfused   CBC    Component Value Date/Time   WBC 7.0 03/12/2023 0558   RBC 2.65 (L) 03/12/2023 0558   HGB 8.9 (L) 03/12/2023 0558   HGB 11.1 (L) 01/31/2022 0920   HCT 25.5 (L) 03/12/2023 0558   HCT 32.7 (L) 01/31/2022 0920   PLT 159 03/12/2023 0558   PLT 262 01/31/2022 0920   MCV 96.2 03/12/2023 0558   MCV 96 01/31/2022 0920   MCV 95 (A) 02/27/2021 0000   MCH 33.6 03/12/2023 0558   MCHC 34.9 03/12/2023 0558   RDW 13.5 03/12/2023 0558   RDW 11.8 01/31/2022 0920   LYMPHSABS 1.5 01/12/2020 1005   MONOABS 0.3 01/22/2007 0923   EOSABS 0.2 01/12/2020 1005   BASOSABS 0.0 01/12/2020 1005    BMET    Component Value Date/Time   NA 131 (L) 03/12/2023 0558   NA 136 02/07/2023 1007   K 3.8 03/12/2023 0558   CL 98 03/12/2023 0558   CO2 25 03/12/2023 0558   GLUCOSE 108 (H) 03/12/2023 0558   BUN 15 03/12/2023 0558   BUN 27 02/07/2023 1007   CREATININE 0.84 03/12/2023 0558   CREATININE 1.26 (H) 12/20/2022 0949   CALCIUM 8.4 (L) 03/12/2023 0558   GFRNONAA >60 03/12/2023 0558   GFRNONAA 57 (L) 12/20/2022 0949   GFRAA 76 11/07/2020 1442    INR    Component Value Date/Time   INR 1.2 03/11/2023 1742     Intake/Output Summary (Last 24 hours) at 03/14/2023 0644 Last data filed at  03/14/2023 0428 Gross per 24 hour  Intake 2085 ml  Output 150 ml  Net 1935 ml      Assessment/Plan:  82 y.o. male is s/p:  Transposition of right SCA to right CCA with ligation of Kommerell Diverticulum, thoracic aortic endovascular stent graft, stent of left CIA and EIA   3 Days Post-Op   -pt feels he is getting a little bit better every day.  Brisk doppler flow bilateral feet.   -no BM-wants enema.  Will order fleets -will order PT and OT evaluations -OOB to chair tid at meal times; ambulate -DVT prophylaxis:  sq heparin -pt on asa/plavix/statin   Doreatha Massed, PA-C [facility] [phone] 03/14/2023 6:44 AM  I have independently interviewed and examined patient and agree with PA assessment and plan above.  Patient subjectively somewhat swallowing better although MBS demonstrates high risk for aspiration and will need alternative means of nutrition in the interim.  May ultimately require GI evaluation.  Madsen Riddle C. Randie Heinz, MD [facility] [place] Office: [phone] Pager: [phone]

## 2023-03-14 NOTE — Evaluation (Signed)
Occupational Therapy Evaluation Patient Details Name: Brian Dunn MRN: [id number] DOB: 04/12/1941 Today's Date: 03/14/2023   History of Present Illness 82 year old male with the diagnosis of Kommerell's diverticulum. s/p 5/7 transposition of right SCA to right CCA with ligation of Kommerell Diverticulum, thoracic aortic endovascular stent graft, stent of left CIA and EIA .PMH: CAD, COPD, CKD, Dysphasgia, Essential HTN, Osteopenia,   Clinical Impression   Patient admitted for the diagnosis above.  PTA he lives at home with his spouse, and remained active and Ind with ADL and iADL.  Currently he is very close to his baseline for ADL and in room mobility/toileting.  No significant OT needs uncovered, encouraged mobility in the acute setting.  No post acute OT anticipated.       Recommendations for follow up therapy are one component of a multi-disciplinary discharge planning process, led by the attending physician.  Recommendations may be updated based on patient status, additional functional criteria and insurance authorization.   Assistance Recommended at Discharge Set up Supervision/Assistance  Patient can return home with the following Assist for transportation    Functional Status Assessment  Patient has not had a recent decline in their functional status  Equipment Recommendations  None recommended by OT    Recommendations for Other Services       Precautions / Restrictions Precautions Precautions: Other (comment) (dysphasia diet) Precaution Comments: modified diet Restrictions Weight Bearing Restrictions: No      Mobility Bed Mobility Overal bed mobility: Needs Assistance                  Transfers Overall transfer level: Modified independent                        Balance Overall balance assessment: Mild deficits observed, not formally tested                                         ADL either performed or assessed with  clinical judgement   ADL Overall ADL's : At baseline                                       General ADL Comments: Generalized supervision     Vision Patient Visual Report: No change from baseline       Perception     Praxis      Pertinent Vitals/Pain Pain Assessment Pain Assessment: No/denies pain Pain Intervention(s): Monitored during session     Hand Dominance     Extremity/Trunk Assessment Upper Extremity Assessment Upper Extremity Assessment: Overall WFL for tasks assessed   Lower Extremity Assessment Lower Extremity Assessment: Defer to PT evaluation   Cervical / Trunk Assessment Cervical / Trunk Assessment: Kyphotic   Communication Communication Communication: HOH   Cognition Arousal/Alertness: Awake/alert Behavior During Therapy: WFL for tasks assessed/performed Overall Cognitive Status: Within Functional Limits for tasks assessed                                       General Comments  VSS on RA, insicion site clean, dry and intact    Exercises     Shoulder Instructions      Home Living Family/patient expects to be discharged to::  Private residence Living Arrangements: Spouse/significant other Available Help at Discharge: Family;Available 24 hours/day Type of Home: House Home Access: Stairs to enter [employer] of Steps: 3   Home Layout: One level     Bathroom Shower/Tub: Chief Strategy Officer: Standard Bathroom Accessibility: Yes   Home Equipment: Grab bars - tub/shower          Prior Functioning/Environment Prior Level of Function : Independent/Modified Independent;Driving             Mobility Comments: ambulating limited community distances without AD, pushes cart in grocery store for support, drives ADLs Comments: independent        OT Problem List: Decreased activity tolerance      OT Treatment/Interventions:      OT Goals(Current goals can be found in the care  plan section) Acute Rehab OT Goals Patient Stated Goal: Hoping to return home OT Goal Formulation: With patient Time For Goal Achievement: 03/21/23 Potential to Achieve Goals: Good  OT Frequency:      Co-evaluation              AM-PAC OT "6 Clicks" Daily Activity     Outcome Measure Help from another person eating meals?: None Help from another person taking care of personal grooming?: None Help from another person toileting, which includes using toliet, bedpan, or urinal?: A Little Help from another person bathing (including washing, rinsing, drying)?: A Little Help from another person to put on and taking off regular upper body clothing?: None Help from another person to put on and taking off regular lower body clothing?: A Little 6 Click Score: 21   End of Session Nurse Communication: Mobility status  Activity Tolerance: Patient tolerated treatment well Patient left: in bed;with call bell/phone within reach;with nursing/sitter in room  OT Visit Diagnosis: Unsteadiness on feet (R26.81)                Time: 1610-9604 OT Time Calculation (min): 20 min Charges:  OT General Charges $OT Visit: 1 Visit OT Evaluation $OT Eval Moderate Complexity: 1 Mod  03/14/2023  RP, OTR/L  Acute Rehabilitation Services  Office:  [phone]   Suzanna Obey 03/14/2023, 10:12 AM

## 2023-03-14 NOTE — Progress Notes (Addendum)
Cortrak Tube Team Note:  Consult received to place a Cortrak feeding tube.   Two Cortrak RDs attempted Cortrak placement at bedside. Unable to advance tube past GE junction. Pressure/resistance encountered at 55 cm and unable to advance tube any further despite multiple attempts and multiple techniques used.  RD seeing pt, RN, and MD were made aware via Secure Chat. Consider consult to diagnostic radiology for feeding tube placement under fluoroscopy. Will discontinue Cortrak order.   Mertie Clause, MS, RD, LDN Inpatient Clinical Dietitian Please see [facility] for contact information.

## 2023-03-15 ENCOUNTER — Inpatient Hospital Stay (HOSPITAL_COMMUNITY): Payer: Medicare HMO

## 2023-03-15 DIAGNOSIS — R1312 Dysphagia, oropharyngeal phase: Secondary | ICD-10-CM

## 2023-03-15 DIAGNOSIS — E46 Unspecified protein-calorie malnutrition: Secondary | ICD-10-CM | POA: Diagnosis not present

## 2023-03-15 DIAGNOSIS — D649 Anemia, unspecified: Secondary | ICD-10-CM

## 2023-03-15 DIAGNOSIS — K224 Dyskinesia of esophagus: Secondary | ICD-10-CM

## 2023-03-15 DIAGNOSIS — K219 Gastro-esophageal reflux disease without esophagitis: Secondary | ICD-10-CM

## 2023-03-15 HISTORY — DX: Dyskinesia of esophagus: K22.4

## 2023-03-15 LAB — IRON AND TIBC
Iron: 13 ug/dL — ABNORMAL LOW (ref 45–182)
Saturation Ratios: 8 % — ABNORMAL LOW (ref 17.9–39.5)
TIBC: 174 ug/dL — ABNORMAL LOW (ref 250–450)
UIBC: 161 ug/dL

## 2023-03-15 LAB — FERRITIN: Ferritin: 519 ng/mL — ABNORMAL HIGH (ref 24–336)

## 2023-03-15 MED ORDER — LIDOCAINE VISCOUS HCL 2 % MT SOLN
15.0000 mL | Freq: Once | OROMUCOSAL | Status: AC
  Administered 2023-03-15: 3 mL via OROMUCOSAL

## 2023-03-15 MED ORDER — PANTOPRAZOLE SODIUM 40 MG IV SOLR
40.0000 mg | Freq: Two times a day (BID) | INTRAVENOUS | Status: DC
  Administered 2023-03-15 – 2023-03-24 (×18): 40 mg via INTRAVENOUS
  Filled 2023-03-15 (×18): qty 10

## 2023-03-15 NOTE — Consult Note (Signed)
Consultation  Referring Provider:   Dr. Randie Heinz Vascular Primary Care Physician:  Noni Saupe, MD Primary Gastroenterologist:  Burney Gauze, MD        Reason for Consultation:     Dysphagia         HPI:   Brian Dunn is a 82 y.o. male with past medical history significant for COPD, lung nodules, essential tremor, CA s/p stent 2008, HTN, GERD, stricture 2022 s/p dilation, oropharyngeal dysphagia.  10/2021 EGD per notes with esophageal stricture s/p dilation with Dr. Jennye Boroughs for dysphagia, IDA and weight loss.  At return visit in Jan patient was referred to ENT for evaluation of oropharngeal dysphagia.  Patient status post right subclavian carotid transposition on 05/7 with Dr. 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 Per per vascular surgeon, surgery went well, however patient has been having worsening oropharyngeal dysphagia since surgery, unable to drink water at this point.  No family was present during discussion with the patient, he is laying in bed in no acute distress. Patient states he has had issues with swallowing since at least 2021 2022.  Had initial weight loss from 170 down to 120s but has been able to get his weight steady in the 130s. Saw GI doctor in 2022 status post dilation states that did not help significantly at all so patient was then referred to speech therapy and ear nose and throat. He eventually was able to get to where he could eat small bites drink a lot of liquids and maintain weight and do well. 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 He states since the procedure his symptoms have worsened, he is unable to even drink water with out coughing and aspiration. When asked about dysphagia or any food getting stuck in his esophagus, patient states this is never been the case.  He has  never felt any food or drink become lodged mid esophagus states he is always had an issue more so with coughing during eating and aspirating. Patient does report reflux they will take Gaviscon daily for, no nighttime symptoms. Patient denies nausea, vomiting, regurgitation. Patient would have some abdominal discomfort after eating that was intermittent. Patient will take ClearLax daily and had bowel movement, had bowel movement yesterday.  Denies melena or hematochezia.  Patient will take tramadol daily, was on Aleve 500 mg once daily, denies alcohol.  Has smoking history 25 years quit 25 years ago.  Sister with neck cancer, no colon cancer history. Patient's had anemia for several years, 8 months ago did have low iron and low saturations with normal ferritin.  Current hemoglobin 9.3. Remote history of colonoscopy, noted overt GI bleeding.  Abnormal ED labs: Abnormal Labs Reviewed  CBC - Abnormal; Notable for the following components:      Result Value   RBC 2.78 (*)    Hemoglobin 9.3 (*)    HCT 26.5 (*)    All other components within normal limits  BASIC METABOLIC PANEL - Abnormal; Notable for the following components:   Sodium 130 (*)    Glucose, Bld 124 (*)    Calcium 8.4 (*)    All other components within normal limits  MAGNESIUM - Abnormal; Notable for the following components:   Magnesium 1.6 (*)    All other components within normal limits  CBC - Abnormal; Notable for the following components:  RBC 2.65 (*)    Hemoglobin 8.9 (*)    HCT 25.5 (*)    All other components within normal limits  BASIC METABOLIC PANEL - Abnormal; Notable for the following components:   Sodium 131 (*)    Glucose, Bld 108 (*)    Calcium 8.4 (*)    All other components within normal limits     Past Medical History:  Diagnosis Date   Acute bronchitis 05/29/2012   Allergic rhinitis    Anemia in chronic kidney disease 02/27/2021   Aneurysm of right subclavian artery (HCC) 02/07/2023   Angina  pectoris (HCC) 11/07/2020   Angina, class III (HCC) 11/07/2020   Asthma 06/18/2012   Followed in Pulmonary clinic/ [facility]/ [place]  - 01/09/2018  After extensive coaching inhaler device  effectiveness =   75% > continue symb 160 2bid and return for pfts in 3 months ? RA bronchiolitis?  - PFT's  04/23/2018  FEV1 2.84 (91 % ) ratio 73  p no % improvement from saba p symb 160 prior to study with DLCO  76 % corrects to 79  % for alv volume   - 04/23/2018  After extensive coaching   Benign essential hypertension 06/20/2016   Bilateral inguinal hernia without obstruction or gangrene 06/20/2016   CAD (coronary artery disease), native coronary artery 06/20/2016   Chest pain, unspecified    Chronic ischemic heart disease, unspecified 01/28/2022   Chronic kidney disease 01/28/2022   COPD (chronic obstructive pulmonary disease) (HCC)    Coronary artery disease involving native coronary artery of native heart with angina pectoris (HCC) 06/20/2016   Cough 03/25/2012   Followed in Pulmonary clinic/ [facility]/ [place]    - Trial off acei again  03/25/2012    - Sinus CT 01/24/11 There is mucosal thickening within the paranasal sinuses without  air-fluid levels. Neither ostiomeatal unit is patent.    Cough variant asthma 10/18/2010   Followed in Pulmonary clinic/ [facility]/ [place]  -HFA 75% p coaching 01/22/2011  -PFT's  02/04/2011 minimal airflow obst, nl dlco    Disorder of bone and cartilage 01/22/2007   Qualifier: Diagnosis of  By: Drue Novel MD, Nolon Rod.   Overview:  Overview:  Qualifier: Diagnosis of  By: Drue Novel MD, Jose E.   Dysphagia 12/27/2022   Onset ? Aug 2022 p febrile illness ? Asp pna   -  MBS 01/13/23   PO Diet Recommendation: Regular; Thin liquids (Level 0)  Liquid Administration via: Cup; Spoon; Straw  Supervision: Patient able to self-feed  Postural changes: -- (try reclining when sipping liquids)  Oral care recommendations: Oral care QID (4x/day); Oral care before ice   chips/water  Recommended  consults: Consider ENT consultation   Encounter for fitting and adjustment of hearing aid 01/29/2022   Essential hypertension 11/06/2017   GERD 01/22/2007   Annotation: history of esophageal stricture Qualifier: Diagnosis of  By: Drue Novel MD, Nolon Rod.    GERD (gastroesophageal reflux disease)    Gout 01/28/2022   HIP FRACTURE 01/22/2007   Annotation: right-sided status post surgery Qualifier: Diagnosis of  By: Drue Novel MD, Nolon Rod.    Hip fracture (HCC)    Hyperlipidemia 06/20/2016   Hyperlipidemia with target LDL less than 70 06/20/2016   Hypertension    HYPOGONADISM 02/18/2007   Qualifier: Diagnosis of  By: Drue Novel MD, Nolon Rod.    Hypogonadism male    Low back pain 09/09/2013   Multiple lung nodules on CT 11/21/2015   CT [place] 10/17/15 mpns >  15 y since quit smoking > rec 12 m f/u as this is low risk > done 06/04/16 no change rec recheck in 70m  - CT [place] 12/08/17 no change nodules > meets benign criteria > no directed f/u - Quantiferon GOLD TB 01/09/18 neg    Osteopenia    Personal history of malignant neoplasm of prostate    Postoperative visit 08/14/2016   Sensorineural hearing loss, bilateral 01/28/2022   Tremor, essential 02/18/2007   Overview:  Overview:  Qualifier: Diagnosis of  By: Drue Novel MD, Nolon Rod.  Last Assessment & Plan:  Reviewed tremor may increase on dulera so will need to be balanced against benefits to cough    Surgical History:  He  has a past surgical history that includes Coronary angioplasty with stent (11/04/2006); Insertion prostate radiation seed; LEFT HEART CATH AND CORONARY ANGIOGRAPHY (N/A, 11/16/2020); CORONARY STENT INTERVENTION (N/A, 11/16/2020); Eye surgery (Bilateral, 11/2022); Carotid-subclavian Bypass Graft (Right, 03/11/2023); Thoracic aortic endovascular stent graft (Bilateral, 03/11/2023); Insertion of iliac stent (Left, 03/11/2023); and Ultrasound guidance for vascular access (Bilateral, 03/11/2023). Family History:  His family history includes Asthma in his father and  sister; Heart disease in his mother and sister; Parkinsonism in his father; Throat cancer in his sister. Social History:   reports that he quit smoking about 26 years ago. His smoking use included cigarettes. He has a 35.00 pack-year smoking history. He has never used smokeless tobacco. He reports that he does not currently use alcohol. He reports that he does not use drugs.  Prior to Admission medications   Medication Sig Start Date End Date Taking? Authorizing Provider  allopurinol (ZYLOPRIM) 100 MG tablet Take 1 tablet by mouth daily.  09/13/13  Yes [provider]  ascorbic acid (VITAMIN C) 500 MG tablet Take 1 tablet by mouth daily. 11/16/21  Yes [provider]  aspirin EC 81 MG tablet Take 1 tablet (81 mg total) by mouth daily. 01/12/20  Yes Revankar, Aundra Dubin, MD  budesonide-formoterol (SYMBICORT) 160-4.5 MCG/ACT inhaler Inhale 2 puffs into the lungs daily.   Yes [provider]  Calcium Carbonate-Vitamin D (OYSTER SHELL CALCIUM/D) 500-5 MG-MCG TABS Take 1 tablet by mouth daily. 11/16/21  Yes [provider]  celecoxib (CELEBREX) 200 MG capsule Take 200 mg by mouth daily.    Yes [provider]  Coenzyme Q10 (CO Q-10) 75 MG CAPS Take 120 mg by mouth daily.   Yes [provider]  famotidine (PEPCID) 40 MG tablet Take 40 mg by mouth daily. 10/12/20  Yes [provider]  ferrous sulfate 325 (65 FE) MG tablet Take 1 tablet by mouth 2 (two) times daily. 11/16/21  Yes [provider]  fluticasone (FLONASE) 50 MCG/ACT nasal spray Place 2 sprays into the nose daily. 12/01/10  Yes [provider]  irbesartan (AVAPRO) 300 MG tablet Take 300 mg by mouth daily as needed (blood pressure).   Yes [provider]  isosorbide mononitrate (IMDUR) 30 MG 24 hr tablet Take 0.5 tablets (15 mg total) by mouth daily. 03/03/23  Yes Revankar, Aundra Dubin, MD  loratadine (CLARITIN) 10 MG tablet Take 10 mg by mouth daily.    Yes [provider]  Magnesium 400 MG CAPS Take 400 mg by mouth 2 (two) times daily.   Yes [provider]  Multiple Vitamin (MULTIVITAMIN) capsule Take 1 capsule by mouth daily.   Yes [provider]  pantoprazole (PROTONIX) 40 MG tablet Take 40 mg by mouth every morning. 10/21/21  Yes [provider]  pravastatin (PRAVACHOL) 40 MG tablet TAKE 1 TABLET BY MOUTH EVERY DAY 01/20/23  Yes Revankar, Aundra Dubin, MD  primidone (MYSOLINE) 50 MG tablet Take 50 mg by mouth at bedtime.  09/30/13  Yes [provider]  tamsulosin (FLOMAX) 0.4 MG CAPS capsule Take 0.4 mg by mouth daily. 02/13/23  Yes [provider]  traMADol (ULTRAM) 50 MG tablet Take 50 mg by mouth 2 (two) times daily as needed for moderate pain. 06/07/20  Yes [provider]  vitamin B-12 (CYANOCOBALAMIN) 500 MCG tablet Take 500 mcg by mouth daily.   Yes [provider]  albuterol (PROAIR HFA) 108 (90 BASE) MCG/ACT inhaler Inhale 2 puffs into the lungs every 6 (six) hours as needed for wheezing. 05/29/12   Coralyn Helling, MD  clopidogrel (PLAVIX) 75 MG tablet TAKE 1 TABLET BY MOUTH EVERY DAY WITH BREAKFAST Patient not taking: Reported on 03/07/2023 04/11/22   Revankar, Aundra Dubin, MD  nitroGLYCERIN (NITROSTAT) 0.4 MG SL tablet Place 1 tablet (0.4 mg total) under the tongue every 5 (five) minutes as needed for chest pain. 01/29/22   Revankar, Aundra Dubin, MD    Current Facility-Administered Medications  Medication Dose Route Frequency Provider Last Rate Last Admin   0.9 %  sodium chloride infusion  500 mL Intravenous Once PRN Baglia, Corrina, PA-C       0.9 %  sodium chloride infusion   Intravenous Continuous Baglia, Corrina, PA-C 75 mL/hr at 03/15/23 0439 Infusion Verify at 03/15/23 0439   acetaminophen (TYLENOL) tablet 325-650 mg  325-650 mg Oral Q4H PRN Baglia, Corrina, PA-C       Or   acetaminophen (TYLENOL) suppository 325-650 mg  325-650 mg Rectal Q4H PRN Baglia, Corrina, PA-C       albuterol  (PROVENTIL) (2.5 MG/3ML) 0.083% nebulizer solution 2.5 mg  2.5 mg Inhalation Q6H PRN Baglia, Corrina, PA-C       allopurinol (ZYLOPRIM) tablet 100 mg  100 mg Oral Daily Baglia, Corrina, PA-C   100 mg at 03/14/23 1011   alum & mag hydroxide-simeth (MAALOX/MYLANTA) 200-200-20 MG/5ML suspension 15-30 mL  15-30 mL Oral Q2H PRN Baglia, Corrina, PA-C       aspirin EC tablet 81 mg  81 mg Oral Daily Baglia, Corrina, PA-C   81 mg at 03/14/23 1013   bisacodyl (DULCOLAX) EC tablet 5 mg  5 mg Oral Daily PRN Baglia, Corrina, PA-C   5 mg at 03/13/23 1511   celecoxib (CELEBREX) capsule 200 mg  200 mg Oral Daily Baglia, Corrina, PA-C   200 mg at 03/14/23 1012   clopidogrel (PLAVIX) tablet 75 mg  75 mg Oral Daily Baglia, Corrina, PA-C   75 mg at 03/14/23 1010   docusate sodium (COLACE) capsule 100 mg  100 mg Oral Daily Baglia, Corrina, PA-C   100 mg at 03/14/23 1012   famotidine (PEPCID) tablet 40 mg  40 mg Oral Daily Baglia, Corrina, PA-C   40 mg at 03/14/23 1012   feeding supplement (ENSURE ENLIVE / ENSURE PLUS) liquid 237 mL  237 mL Oral BID BM Maeola Harman, MD   237 mL at 03/14/23 1026   fluticasone (FLONASE) 50 MCG/ACT nasal spray 2 spray  2 spray Each Nare Daily Baglia, Corrina, PA-C   2 spray at 03/14/23 1029   fluticasone furoate-vilanterol (BREO ELLIPTA) 200-25 MCG/ACT 1 puff  1 puff Inhalation Daily Baglia, Corrina, PA-C   1 puff at 03/15/23 0815   guaiFENesin-dextromethorphan (ROBITUSSIN DM) 100-10 MG/5ML syrup 15 mL  15 mL Oral  Q4H PRN Baglia, Corrina, PA-C       heparin injection 5,000 Units  5,000 Units Subcutaneous Q8H Baglia, Corrina, PA-C   5,000 Units at 03/15/23 1610   hydrALAZINE (APRESOLINE) injection 5 mg  5 mg Intravenous Q20 Min PRN Baglia, Corrina, PA-C       HYDROmorphone (DILAUDID) injection 0.5-1 mg  0.5-1 mg Intravenous Q2H PRN Baglia, Corrina, PA-C   1 mg at 03/15/23 0040   irbesartan (AVAPRO) tablet 300 mg  300 mg Oral Daily PRN Baglia, Corrina, PA-C       isosorbide  mononitrate (IMDUR) 24 hr tablet 15 mg  15 mg Oral Daily Baglia, Corrina, PA-C   15 mg at 03/14/23 1011   labetalol (NORMODYNE) injection 10 mg  10 mg Intravenous Q10 min PRN Baglia, Corrina, PA-C       loratadine (CLARITIN) tablet 10 mg  10 mg Oral Daily Baglia, Corrina, PA-C   10 mg at 03/14/23 1013   magnesium sulfate IVPB 2 g 50 mL  2 g Intravenous Daily PRN Baglia, Corrina, PA-C       metoprolol tartrate (LOPRESSOR) injection 2-5 mg  2-5 mg Intravenous Q2H PRN Baglia, Corrina, PA-C       multivitamin with minerals tablet 1 tablet  1 tablet Oral Daily Baglia, Corrina, PA-C   1 tablet at 03/13/23 1020   ondansetron (ZOFRAN) injection 4 mg  4 mg Intravenous Q6H PRN Baglia, Corrina, PA-C       pantoprazole (PROTONIX) EC tablet 40 mg  40 mg Oral q morning Baglia, Corrina, PA-C   40 mg at 03/14/23 1014   phenol (CHLORASEPTIC) mouth spray 1 spray  1 spray Mouth/Throat PRN Baglia, Corrina, PA-C       potassium chloride SA (KLOR-CON M) CR tablet 20-40 mEq  20-40 mEq Oral Daily PRN Baglia, Corrina, PA-C       pravastatin (PRAVACHOL) tablet 40 mg  40 mg Oral Daily Baglia, Corrina, PA-C   40 mg at 03/14/23 1013   primidone (MYSOLINE) tablet 50 mg  50 mg Oral QHS Baglia, Corrina, PA-C   50 mg at 03/13/23 2134   senna-docusate (Senokot-S) tablet 1 tablet  1 tablet Oral QHS PRN Baglia, Corrina, PA-C   1 tablet at 03/14/23 1014   tamsulosin (FLOMAX) capsule 0.4 mg  0.4 mg Oral Daily Baglia, Corrina, PA-C   0.4 mg at 03/14/23 1013   traMADol (ULTRAM) tablet 50 mg  50 mg Oral Q6H PRN Baglia, Corrina, PA-C   50 mg at 03/13/23 2135    Allergies as of 03/03/2023 - Review Complete 02/26/2023  Allergen Reaction Noted   Metoprolol Other (See Comments) 11/24/2015   Morphine     Penicillin g Swelling 09/09/2013   Penicillins      Review of Systems:    Constitutional: No weight loss, fever, chills, weakness or fatigue HEENT: Eyes: No change in vision               Ears, Nose, Throat:  No change in hearing or  congestion Skin: No rash or itching Cardiovascular: No chest pain, chest pressure or palpitations   Respiratory: No SOB or cough Gastrointestinal: See HPI and otherwise negative Genitourinary: No dysuria or change in urinary frequency Neurological: No headache, dizziness or syncope Musculoskeletal: No new muscle or joint pain Hematologic: No bleeding or bruising Psychiatric: No history of depression or anxiety     Physical Exam:  Vital signs in last 24 hours: Temp:  [97.6 F (36.4 C)-98.6 F (37 C)] 97.9 F (36.6  C) (05/11 0725) Pulse Rate:  [61-81] 66 (05/11 0725) Resp:  [15-18] 16 (05/11 0725) BP: (122-141)/(60-74) 136/70 (05/11 0725) SpO2:  [94 %-99 %] 97 % (05/11 0815) Last BM Date : 03/14/23 Last BM recorded by nurses in past 5 days Stool Type: Type 1 (Separate hard lumps) (03/14/2023  3:30 PM)  General:   Pleasant, thin appearing male in no acute distress Head:  Normocephalic and atraumatic.  Well-healed right-sided surgical neck scar. Eyes: sclerae anicteric,conjunctive pale  Heart:  regular rate and rhythm, no murmurs or gallops Pulm: Clear anteriorly; no wheezing Abdomen:  Soft, Flat AB, Active bowel sounds. No tenderness , No organomegaly appreciated. Extremities:  Without edema. Msk:  Symmetrical without gross deformities. Peripheral pulses intact.  Neurologic:  Alert and  oriented x4;  No focal deficits.  Skin:   Dry and intact without significant lesions or rashes. Psychiatric:  Cooperative. Normal mood and affect.  LAB RESULTS: No results for input(s): "WBC", "HGB", "HCT", "PLT" in the last 72 hours. BMET No results for input(s): "NA", "K", "CL", "CO2", "GLUCOSE", "BUN", "CREATININE", "CALCIUM" in the last 72 hours. LFT No results for input(s): "PROT", "ALBUMIN", "AST", "ALT", "ALKPHOS", "BILITOT", "BILIDIR", "IBILI" in the last 72 hours. PT/INR No results for input(s): "LABPROT", "INR" in the last 72 hours.  STUDIES: DG Swallowing Func-Speech  Pathology  Result Date: 03/14/2023 Table formatting from the original result was not included. Modified Barium Swallow Study Patient Details Name: Brian Dunn MRN: [id number] Date of Birth: 18-Dec-1940 Today's Date: 03/14/2023 HPI/PMH: HPI: Pt is an 82 y/o male who presented for surgery, with the diagnosis of Kommerell's diverticulum. Pt s/p transposition of right SCA to right CCA with ligation of Kommerell Diverticulum, thoracic aortic endovascular stent graft, stent of left CIA and EIA 5/7.  PMH: vascular disease and CAD. MBS 01/13/23: lordosis of his cervical spine, thinning of the prevertebral space, and what appears to be hypertrophy in the mid-superior pharynx (presents similarly to CP bar but located anatomically higher). Poor base-of-tongue to pharyngeal wall contact and the epiglottis is unable to invert past its horizontal position. There is good laryngeal elevation; the arytenoids never fully meet the epiglottic petiole, minimal distension of the PES. Factors led to significant residue that accumulates in the pharynx, regardess of consistency, and is aspirated either during or after the swallow response. A regular texture diet with thin liquids was recommended with observance of swallowing precautions considering the severity of symptoms across consistencies. [facility] referral at [facility] and outpatient SLP services recommended. Per Dr. Randie Heinz,  CT scan appeared to show the diverticulum to be compressing the esophagus against the trachea.  He discussed with the patient and his family at bedside that this is very likely at least a contributing factor to his dysphagia given the large size of the diverticulum relative to the small area where it existed. Clinical Impression: Clinical Impression: Pt demonstrates some improvement since prior MBS, but dysphagia is still severe with high risk of aspiration. Study limited due to pt request to stop. Pt demonstrates good oral function, and very good laryngeal elevation and  glottic closure. Pt initiates laryngeal elevation, but hyoid excursion, epiglottic deflection and UES opening are particularly problematic. Pt can transit bolus, hold glottis closed and apply effort for partial propulsive movement. Pts curved epiglottis does not invert and large amounts of residue collect there. Ultimately, after releasing glottic closure pt had penetration of nectar and puree and aspiration of thin (did not test honey or chewable solids). Pt had some success with a '  stretch and tuck' posture to straighten cervical lordosis and facilitate bolus transit (trialed on sequence 10, 11, 12 though movement too subtle to see). Cues for effort also helpful. Overall, pt is at high risk of aspiration and likely has been aspirating for some time. The effort needed to eat is likely disproportionate to the nutrition recieved. Recommend NPO during acute recovery. Pt needs to initiate pharyngeal strengthening exercises as able with reassessment next week. MD agreed to placement of cortrak today. Factors that may increase risk of adverse event in presence of aspiration Rubye Oaks & Clearance Coots 2021): Factors that may increase risk of adverse event in presence of aspiration Rubye Oaks & Clearance Coots 2021): Weak cough; Aspiration of thick, dense, and/or acidic materials; Frequent aspiration of large volumes Recommendations/Plan: Swallowing Evaluation Recommendations Swallowing Evaluation Recommendations Recommendations: NPO; Alternative means of nutrition - NG Tube PO Diet Recommendation: Regular; Thin liquids (Level 0) Liquid Administration via: Cup; Spoon; Straw Medication Administration: Via alternative means Supervision: Patient able to self-feed Postural changes: -- (try reclining when sipping liquids) Oral care recommendations: Oral care QID (4x/day); Oral care before ice chips/water Recommended consults: Consider ENT consultation Treatment Plan Treatment Plan Treatment recommendations: Therapy as outlined in treatment plan  below Follow-up recommendations: Acute inpatient rehab (3 hours/day) Functional status assessment: Patient has had a recent decline in their functional status and demonstrates the ability to make significant improvements in function in a reasonable and predictable amount of time. Treatment frequency: Min 2x/week Treatment duration: 2 weeks Interventions: Oropharyngeal exercises; Compensatory techniques Recommendations Recommendations for follow up therapy are one component of a multi-disciplinary discharge planning process, led by the attending physician.  Recommendations may be updated based on patient status, additional functional criteria and insurance authorization. Assessment: Orofacial Exam: Orofacial Exam Oral Cavity: Oral Hygiene: WFL Oral Cavity - Dentition: Missing dentition Orofacial Anatomy: WFL Oral Motor/Sensory Function: WFL (no focal CN deficits) Anatomy: Anatomy: WFL Boluses Administered: Boluses Administered Boluses Administered: Thin liquids (Level 0); Mildly thick liquids (Level 2, nectar thick); Puree  Oral Impairment Domain: Oral Impairment Domain Lip Closure: No labial escape Tongue control during bolus hold: Cohesive bolus between tongue to palatal seal Bolus preparation/mastication: Slow prolonged chewing/mashing with complete recollection Bolus transport/lingual motion: Slow tongue motion Oral residue: Residue collection on oral structures Location of oral residue : Tongue Initiation of pharyngeal swallow : Valleculae  Pharyngeal Impairment Domain: Pharyngeal Impairment Domain Soft palate elevation: No bolus between soft palate (SP)/pharyngeal wall (PW) Laryngeal elevation: Complete superior movement of thyroid cartilage with complete approximation of arytenoids to epiglottic petiole Anterior hyoid excursion: Partial anterior movement Epiglottic movement: No inversion Laryngeal vestibule closure: Incomplete, narrow column air/contrast in laryngeal vestibule Pharyngeal stripping wave :  Present - diminished Pharyngeal contraction (A/P view only): N/A Pharyngoesophageal segment opening: Minimal distention/minimal duration, marked obstruction of flow Tongue base retraction: Wide column of contrast or air between tongue base and PPW Pharyngeal residue: Majority of contrast within or on pharyngeal structures Location of pharyngeal residue: Valleculae; Pyriform sinuses; Pharyngeal wall  Esophageal Impairment Domain: No data recorded Pill: No data recorded Penetration/Aspiration Scale Score: Penetration/Aspiration Scale Score 4.  Material enters airway, CONTACTS cords then ejected out: Mildly thick liquids (Level 2, nectar thick) 5.  Material enters airway, CONTACTS cords and not ejected out: Puree 6.  Material enters airway, passes BELOW cords then ejected out: Thin liquids (Level 0) Compensatory Strategies: Compensatory Strategies Compensatory strategies: Yes Straw: Ineffective Effortful swallow: Effective Multiple swallows: Effective Chin tuck: Effective Ineffective Chin Tuck: Thin liquid (Level 0) Left head turn: Ineffective Ineffective  Left Head Turn: Thin liquid (Level 0); Puree Right head turn: Ineffective Ineffective Right Head Turn: Thin liquid (Level 0); Puree Reclining posture: Effective Effective Reclining Posture: Thin liquid (Level 0) (may have helped marginally with transition of thin liquids through PES and minimizing aspiration)   General Information: Caregiver present: No  Diet Prior to this Study: Moderately thick liquids (Level 3, honey thick)   Temperature : Normal   Respiratory Status: WFL   Supplemental O2: None (Room air)   History of Recent Intubation: No  Behavior/Cognition: Alert; Cooperative Self-Feeding Abilities: Able to self-feed Baseline vocal quality/speech: Normal Volitional Cough: Able to elicit Volitional Swallow: Able to elicit Exam Limitations: Poor bolus acceptance; Fatigue Goal Planning: Prognosis for improved oropharyngeal function: Fair Barriers to Reach Goals:  Severity of deficits; Time post onset No data recorded Patient/Family Stated Goal: none stated Consulted and agree with results and recommendations: Patient Pain: Pain Assessment Pain Assessment: No/denies pain Pain Score: 5 Faces Pain Scale: 0 Pain Location: incision site Pain Intervention(s): Monitored during session End of Session: Start Time:SLP Start Time (ACUTE ONLY): 1255 Stop Time: SLP Stop Time (ACUTE ONLY): 1320 Time Calculation:SLP Time Calculation (min) (ACUTE ONLY): 25 min Charges: SLP Evaluations $ SLP Speech Visit: 1 Visit SLP Evaluations $BSS Swallow: 1 Procedure $MBS Swallow: 1 Procedure $Outpatient MBS Swallow: 1 Procedure $Swallowing Treatment: 1 Procedure SLP visit diagnosis: SLP Visit Diagnosis: Dysphagia, pharyngoesophageal phase (R13.14) Past Medical History: Past Medical History: Diagnosis Date  Acute bronchitis 05/29/2012  Allergic rhinitis   Anemia in chronic kidney disease 02/27/2021  Aneurysm of right subclavian artery (HCC) 02/07/2023  Angina pectoris (HCC) 11/07/2020  Angina, class III (HCC) 11/07/2020  Asthma 06/18/2012  Followed in Pulmonary clinic/ [facility]/ [place]  - 01/09/2018  After extensive coaching inhaler device  effectiveness =   75% > continue symb 160 2bid and return for pfts in 3 months ? RA bronchiolitis?  - PFT's  04/23/2018  FEV1 2.84 (91 % ) ratio 73  p no % improvement from saba p symb 160 prior to study with DLCO  76 % corrects to 79  % for alv volume   - 04/23/2018  After extensive coaching  Benign essential hypertension 06/20/2016  Bilateral inguinal hernia without obstruction or gangrene 06/20/2016  CAD (coronary artery disease), native coronary artery 06/20/2016  Chest pain, unspecified   Chronic ischemic heart disease, unspecified 01/28/2022  Chronic kidney disease 01/28/2022  COPD (chronic obstructive pulmonary disease) (HCC)   Coronary artery disease involving native coronary artery of native heart with angina pectoris (HCC) 06/20/2016  Cough 03/25/2012   Followed in Pulmonary clinic/ [facility]/ [place]    - Trial off acei again  03/25/2012    - Sinus CT 01/24/11 There is mucosal thickening within the paranasal sinuses without  air-fluid levels. Neither ostiomeatal unit is patent.   Cough variant asthma 10/18/2010  Followed in Pulmonary clinic/ [facility]/ [place]  -HFA 75% p coaching 01/22/2011  -PFT's  02/04/2011 minimal airflow obst, nl dlco   Disorder of bone and cartilage 01/22/2007  Qualifier: Diagnosis of  By: Drue Novel MD, Nolon Rod.   Overview:  Overview:  Qualifier: Diagnosis of  By: Drue Novel MD, Jose E.  Dysphagia 12/27/2022  Onset ? Aug 2022 p febrile illness ? Asp pna   -  MBS 01/13/23   PO Diet Recommendation: Regular; Thin liquids (Level 0)  Liquid Administration via: Cup; Spoon; Straw  Supervision: Patient able to self-feed  Postural changes: -- (try reclining when sipping liquids)  Oral care recommendations: Oral  care QID (4x/day); Oral care before ice   chips/water  Recommended consults: Consider ENT consultation  Encounter for fitting and adjustment of hearing aid 01/29/2022  Essential hypertension 11/06/2017  GERD 01/22/2007  Annotation: history of esophageal stricture Qualifier: Diagnosis of  By: Drue Novel MD, Nolon Rod.   GERD (gastroesophageal reflux disease)   Gout 01/28/2022  HIP FRACTURE 01/22/2007  Annotation: right-sided status post surgery Qualifier: Diagnosis of  By: Drue Novel MD, Nolon Rod.   Hip fracture (HCC)   Hyperlipidemia 06/20/2016  Hyperlipidemia with target LDL less than 70 06/20/2016  Hypertension   HYPOGONADISM 02/18/2007  Qualifier: Diagnosis of  By: Drue Novel MD, Nolon Rod.   Hypogonadism male   Low back pain 09/09/2013  Multiple lung nodules on CT 11/21/2015  CT [place] 10/17/15 mpns > 15 y since quit smoking > rec 12 m f/u as this is low risk > done 06/04/16 no change rec recheck in 82m  - CT [place] 12/08/17 no change nodules > meets benign criteria > no directed f/u - Quantiferon GOLD TB 01/09/18 neg   Osteopenia   Personal history of malignant neoplasm of  prostate   Postoperative visit 08/14/2016  Sensorineural hearing loss, bilateral 01/28/2022  Tremor, essential 02/18/2007  Overview:  Overview:  Qualifier: Diagnosis of  By: Drue Novel MD, Nolon Rod.  Last Assessment & Plan:  Reviewed tremor may increase on dulera so will need to be balanced against benefits to cough Past Surgical History: Past Surgical History: Procedure Laterality Date  CAROTID-SUBCLAVIAN BYPASS GRAFT Right 03/11/2023  Procedure: RIGHT SUBCLAVIAN-CAROTID TRANSPOSITION;  Surgeon: Maeola Harman, MD;  Location: [facility] OR;  Service: Vascular;  Laterality: Right;  CORONARY ANGIOPLASTY WITH STENT PLACEMENT  11/04/2006  CORONARY STENT INTERVENTION N/A 11/16/2020  Procedure: CORONARY STENT INTERVENTION;  Surgeon: Marykay Lex, MD;  Location: [facility] INVASIVE CV LAB;  Service: Cardiovascular;  Laterality: N/A;  EYE SURGERY Bilateral 11/2022  cataract removal  INSERTION OF ILIAC STENT Left 03/11/2023  Procedure: INSERTION OF LEFT ILIAC STENT USING VIABAHN VBX X 54. STENT;  Surgeon: Maeola Harman, MD;  Location: [facility] OR;  Service: Vascular;  Laterality: Left;  INSERTION PROSTATE RADIATION SEED    LEFT HEART CATH AND CORONARY ANGIOGRAPHY N/A 11/16/2020  Procedure: LEFT HEART CATH AND CORONARY ANGIOGRAPHY;  Surgeon: Marykay Lex, MD;  Location: [facility] INVASIVE CV LAB;  Service: Cardiovascular;  Laterality: N/A;  THORACIC AORTIC ENDOVASCULAR STENT GRAFT Bilateral 03/11/2023  Procedure: THORACIC AORTIC ENDOVASCULAR STENT GRAFT USING ZENITH ALPHA X STENT (COOK);  Surgeon: Maeola Harman, MD;  Location: [facility] OR;  Service: Vascular;  Laterality: Bilateral;  ULTRASOUND GUIDANCE FOR VASCULAR ACCESS Bilateral 03/11/2023  Procedure: ULTRASOUND GUIDANCE FOR VASCULAR ACCESS;  Surgeon: Maeola Harman, MD;  Location: [facility] OR;  Service: Vascular;  Laterality: Bilateral; Harlon Ditty, MA CCC-SLP Acute Rehabilitation Services Secure Chat Preferred Office [phone] Claudine Mouton 03/14/2023, 3:04 PM     Impression    Oropharyngeal dysphagia worsening since  right subclavian carotid transposition on 05/7 EGD 2022 status post dilation the patient states did not help Patient failed MBS in the hospital, currently n.p.o. status Seems much more aspiration/oropharyngeal at this time. No need for endoscopic evaluation at this time, however due to progressively worsening dysphagia, when patient is improved from oropharyngeal stand point can get EGD. Please notify GI team. In the meantime patient does have some reflux and is not well treated, will optimize reflux therapy, Protonix 40 mg twice daily IV.  GERD On Celebrex 20 mg daily, was on  leave at home. Will add on Protonix 40 mg twice IV Consider doing NSAIDs  S/p right subclavian carotid transposition on 05/7 On Plavix and heparin Will need to hold procedures for potential dilation but continue at this time since recent vascular procedure.  Normocytic anemia Patient has had prolonged history of normocytic anemia EGD 2022, colonoscopy 2008 Dr. Arlyce Dice 3 polyps removed, 2 of which were adenomatous Possible malnutrition component, no overt GI bleeding Slightly worsening since surgery Will get iron and ferritin May benefit from iron infusion this visit  moderate protein calorie malnutrition Albumin 03/07/2023  3.6  BMI body mass index is 19.48 kg/m.  Secondary to Dysphagia - RD consult Count calories, increase protein Consider core track/peg tube if prolonged NPO   Principal Problem:   Kommerell's diverticulum Active Problems:   Protein-calorie malnutrition, severe    LOS: 4 days     Thank you for your kind consultation, we will continue to follow.   Doree Albee  03/15/2023, 8:57 AM

## 2023-03-15 NOTE — Progress Notes (Signed)
Pt's wife has questions about next steps or other options available regarding pt's nutritional status due to unsuccessful IR placement under fluoroscopy for feeding tube today. Emilie Rutter, PA paged. Awaiting PA response. No new orders at this time.

## 2023-03-15 NOTE — Progress Notes (Signed)
Patient presented to [facility] for placement of NG tube. Multiple attempts made but tube consistently went into the trachea. After 4 or so attempts the patient asked [place] to stop. Ordering provider made aware.  Alwyn Ren, [place] [phone] 03/15/2023, 12:33 PM

## 2023-03-15 NOTE — Progress Notes (Signed)
Speech Language Pathology Treatment: Dysphagia  Patient Details Name: Brian Dunn MRN: [id number] DOB: 11/20/1940 Today's Date: 03/15/2023 Time: 4010-2725 SLP Time Calculation (min) (ACUTE ONLY): 24 min  Assessment / Plan / Recommendation Clinical Impression  Pt seen for pharyngeal strengthening exercises wife present and supportive and pt agreeable to treatment. Introduced exercises focused on facilitation of tongue base retraction/pharyngeal contraction (Masako), laryngeal elevation (pitch variation), UES opening (Shaker) and activation of suprahyoid musculature (chin tuck against resistance CTAR). Unable to keep ball in place with CTAR therefore used  hand to hold in place. He had difficulty maintaining lingual protrusion during Masako but mild improvement with continued effort. Encouraged ice chips and gave fresh cup to pt and wife but did not observe with chips this session as transport arrived to take pt to IR for Cortrak placement. SLP reiterated oral care and upright position. List of exercises given to pt/wife and advised to practice as much as able with wife verbalizing understanding.     HPI HPI: Pt is an 82 y/o male who presented for surgery, with the diagnosis of Kommerell's diverticulum. Pt s/p transposition of right SCA to right CCA with ligation of Kommerell Diverticulum, thoracic aortic endovascular stent graft, stent of left CIA and EIA 5/7.  PMH: vascular disease and CAD. MBS 01/13/23: lordosis of his cervical spine, thinning of the prevertebral space, and what appears to be hypertrophy in the mid-superior pharynx (presents similarly to CP bar but located anatomically higher). Poor base-of-tongue to pharyngeal wall contact and the epiglottis is unable to invert past its horizontal position. There is good laryngeal elevation; the arytenoids never fully meet the epiglottic petiole, minimal distension of the PES. Factors led to significant residue that accumulates in the pharynx,  regardess of consistency, and is aspirated either during or after the swallow response. A regular texture diet with thin liquids was recommended with observance of swallowing precautions considering the severity of symptoms across consistencies. ENT referral at [facility] and outpatient SLP services recommended. Per Dr. Randie Heinz,  CT scan appeared to show the diverticulum to be compressing the esophagus against the trachea.  He discussed with the patient and his family at bedside that this is very likely at least a contributing factor to his dysphagia given the large size of the diverticulum relative to the small area where it existed.      SLP Plan  Continue with current plan of care      Recommendations for follow up therapy are one component of a multi-disciplinary discharge planning process, led by the attending physician.  Recommendations may be updated based on patient status, additional functional criteria and insurance authorization.    Recommendations  Diet recommendations: Other(comment) (ice chips) Medication Administration: Via alternative means (having Cortrak placed in IR today)                  Oral care QID;Oral care prior to ice chip/H20   Intermittent Supervision/Assistance Dysphagia, pharyngoesophageal phase (R13.14)     Continue with current plan of care     Royce Macadamia  03/15/2023, 12:20 PM

## 2023-03-15 NOTE — Progress Notes (Addendum)
  Progress Note    03/15/2023 8:11 AM 4 Days Post-Op  Subjective:  hungry   Vitals:   03/15/23 0315 03/15/23 0725  BP: 125/60 136/70  Pulse: 61 66  Resp: 17 16  Temp: 98.6 F (37 C) 97.9 F (36.6 C)  SpO2: 94% 99%   Physical Exam: Lungs:  non labored Incisions:  R neck and R groin incision healing well Extremities:  palpable L DP; R foot warm Abdomen:  soft, NT, ND Neurologic: A&O  CBC    Component Value Date/Time   WBC 7.0 03/12/2023 0558   RBC 2.65 (L) 03/12/2023 0558   HGB 8.9 (L) 03/12/2023 0558   HGB 11.1 (L) 01/31/2022 0920   HCT 25.5 (L) 03/12/2023 0558   HCT 32.7 (L) 01/31/2022 0920   PLT 159 03/12/2023 0558   PLT 262 01/31/2022 0920   MCV 96.2 03/12/2023 0558   MCV 96 01/31/2022 0920   MCV 95 (A) 02/27/2021 0000   MCH 33.6 03/12/2023 0558   MCHC 34.9 03/12/2023 0558   RDW 13.5 03/12/2023 0558   RDW 11.8 01/31/2022 0920   LYMPHSABS 1.5 01/12/2020 1005   MONOABS 0.3 01/22/2007 0923   EOSABS 0.2 01/12/2020 1005   BASOSABS 0.0 01/12/2020 1005    BMET    Component Value Date/Time   NA 131 (L) 03/12/2023 0558   NA 136 02/07/2023 1007   K 3.8 03/12/2023 0558   CL 98 03/12/2023 0558   CO2 25 03/12/2023 0558   GLUCOSE 108 (H) 03/12/2023 0558   BUN 15 03/12/2023 0558   BUN 27 02/07/2023 1007   CREATININE 0.84 03/12/2023 0558   CREATININE 1.26 (H) 12/20/2022 0949   CALCIUM 8.4 (L) 03/12/2023 0558   GFRNONAA >60 03/12/2023 0558   GFRNONAA 57 (L) 12/20/2022 0949   GFRAA 76 11/07/2020 1442    INR    Component Value Date/Time   INR 1.2 03/11/2023 1742     Intake/Output Summary (Last 24 hours) at 03/15/2023 0811 Last data filed at 03/15/2023 0439 Gross per 24 hour  Intake 1648.33 ml  Output 175 ml  Net 1473.33 ml     Assessment/Plan:  82 y.o. male is s/p Transposition of right SCA to right CCA with ligation of Kommerell Diverticulum, thoracic aortic endovascular stent graft, stent of left CIA and EIA  4 Days Post-Op   Feet are warm and  well perfused.  He incisions are healing well.  Barium swallow interpreted as high risk for aspiration.  Speech recommended NG tube however this has been difficult to place.  Order written for IR placement under flouroscopy.  We will also consult GI for likely esophageal stricture.    Emilie Rutter, PA-C [facility] [phone] 03/15/2023 8:11 AM    I have interviewed and examined patient with PA and agree with assessment and plan above.   Giliana Vantil C. Randie Heinz, MD [facility] [place] Office: [phone] Pager: [phone]

## 2023-03-15 NOTE — Plan of Care (Signed)
  Problem: Pain Managment: Goal: General experience of comfort will improve Outcome: Progressing   Problem: Safety: Goal: Ability to remain free from injury will improve Outcome: Progressing   Problem: Skin Integrity: Goal: Risk for impaired skin integrity will decrease Outcome: Progressing   

## 2023-03-15 NOTE — Progress Notes (Addendum)
Physical Therapy Treatment Patient Details Name: Brian Dunn MRN: [id number] DOB: 1941/04/12 Today's Date: 03/15/2023   History of Present Illness 82 year old male with the diagnosis of Kommerell's diverticulum. s/p 5/7 transposition of right SCA to right CCA with ligation of Kommerell Diverticulum, thoracic aortic endovascular stent graft, stent of left CIA and EIA .PMH: CAD, COPD, CKD, Dysphasgia, Essential HTN, Osteopenia,    PT Comments    Pt received up in room in care of NT/spouse, pt agreeable to therapy session and with good effort throughout. Emphasis on safe gait/stair negotiation, pt steadier and with improved posture using RW, pt/family agreeable to using RW DME upon DC, recommendations below remain appropriate. HEP handouts printed and brought to room for supine/seated/standing exercises and use of IS, pt/family receptive. Pt continues to benefit from PT services to progress toward functional mobility goals.    Recommendations for follow up therapy are one component of a multi-disciplinary discharge planning process, led by the attending physician.  Recommendations may be updated based on patient status, additional functional criteria and insurance authorization.  Follow Up Recommendations       Assistance Recommended at Discharge Frequent or constant Supervision/Assistance  Patient can return home with the following A little help with walking and/or transfers;A little help with bathing/dressing/bathroom;Assistance with cooking/housework;Assist for transportation;Help with stairs or ramp for entrance   Equipment Recommendations  Rolling walker (2 wheels)    Recommendations for Other Services       Precautions / Restrictions Precautions Precautions: Other (comment) (dysphagia diet) Restrictions Weight Bearing Restrictions: No     Mobility  Bed Mobility               General bed mobility comments: received up in room with NT/family    Transfers Overall  transfer level: Modified independent                 General transfer comment: pt reaches back appropriately when sitting to EOB    Ambulation/Gait Ambulation/Gait assistance: Supervision Gait Distance (Feet): 150 Feet Assistive device: Rolling walker (2 wheels) Gait Pattern/deviations: Step-through pattern, Decreased step length - right, Decreased step length - left, Trunk flexed Gait velocity: slowed     General Gait Details: supervision for safety and postural cues and cues for RW management; HR WFL up to 93 bpm during trial, 70's bpm resting; SpO2 100% on RA at end of trial (pt received up in room without sensor attached), no overt s/sx dyspnea on exertion.   Stairs Stairs: Yes Stairs assistance: Min guard, Min assist Stair Management: Step to pattern, Forwards, With walker Number of Stairs: 3 (single 7" step x3 reps) General stair comments: cues for posture, safe step sequencing, and activity pacing; no overt LOB, pt mildly unsteady needing minA on first step, on subsequent trials pt steadier.   Wheelchair Mobility    Modified Rankin (Stroke Patients Only)       Balance Overall balance assessment: Mild deficits observed, not formally tested                                          Cognition Arousal/Alertness: Awake/alert Behavior During Therapy: WFL for tasks assessed/performed Overall Cognitive Status: Within Functional Limits for tasks assessed                                 General Comments: Mild  impulsivity but pt following commands with slight delay [facility])        Exercises Other Exercises Other Exercises: pt given IS, HEP handouts to review for supine/seated/standing BLE exercises. Reviewed seated chair push-ups with demo, standing mini squats/marching and supine/seated LE exercises for strengthening, pt/family receptive. Other Exercises: Cues for improved technique with IS, pt performed x10 reps (handout to  reinforce)    General Comments General comments (skin integrity, edema, etc.): BP 136/71 (91) seated EOB post-exertion, no dizziness during trial or significant c/o fatigue. HR 76 bpm resting and to 93 bpm with exertional tasks, SpO2 100% on RA      Pertinent Vitals/Pain Pain Assessment Pain Assessment: No/denies pain Faces Pain Scale: No hurt Pain Intervention(s): Monitored during session, Repositioned     PT Goals (current goals can now be found in the care plan section) Acute Rehab PT Goals Patient Stated Goal: get home PT Goal Formulation: With patient Time For Goal Achievement: 03/28/23 Progress towards PT goals: Progressing toward goals    Frequency    Min 1X/week      PT Plan Current plan remains appropriate       AM-PAC PT "6 Clicks" Mobility   Outcome Measure  Help needed turning from your back to your side while in a flat bed without using bedrails?: None Help needed moving from lying on your back to sitting on the side of a flat bed without using bedrails?: None Help needed moving to and from a bed to a chair (including a wheelchair)?: None Help needed standing up from a chair using your arms (e.g., wheelchair or bedside chair)?: A Little Help needed to walk in hospital room?: A Little Help needed climbing 3-5 steps with a railing? : A Little 6 Click Score: 21    End of Session Equipment Utilized During Treatment: Gait belt;Other (comment) (pt/family given gait belt for home) Activity Tolerance: Patient tolerated treatment well Patient left: in bed;with family/visitor present (sitting EoB, family present (pt not yet ready to lay back)) Nurse Communication: Mobility status PT Visit Diagnosis: Muscle weakness (generalized) (M62.81);Difficulty in walking, not elsewhere classified (R26.2)     Time: 1610-9604 PT Time Calculation (min) (ACUTE ONLY): 16 min  Charges:  $Gait Training: 8-22 mins                     Carder Yin P., PTA Acute Rehabilitation  Services Secure Chat Preferred 9a-5:30pm Office: [phone]    Dorathy Kinsman [facility] 03/15/2023, 3:40 PM

## 2023-03-16 ENCOUNTER — Inpatient Hospital Stay (HOSPITAL_COMMUNITY): Payer: Medicare HMO

## 2023-03-16 NOTE — Progress Notes (Addendum)
  Progress Note    03/16/2023 8:46 AM 5 Days Post-Op  Subjective: No complaints.  Unsuccessful core track placement yesterday under fluoroscopy   Vitals:   03/16/23 0520 03/16/23 0808  BP: (!) 141/62 (!) 143/63  Pulse:  83  Resp: 20 20  Temp: 97.9 F (36.6 C) 98.1 F (36.7 C)  SpO2: 96% 96%   Physical Exam: Lungs: Nonlabored Incisions: Groin incisions well-healed; right neck incision well-appearing Extremities: Moving all extremities well Neurologic: A&O  CBC    Component Value Date/Time   WBC 7.0 03/12/2023 0558   RBC 2.65 (L) 03/12/2023 0558   HGB 8.9 (L) 03/12/2023 0558   HGB 11.1 (L) 01/31/2022 0920   HCT 25.5 (L) 03/12/2023 0558   HCT 32.7 (L) 01/31/2022 0920   PLT 159 03/12/2023 0558   PLT 262 01/31/2022 0920   MCV 96.2 03/12/2023 0558   MCV 96 01/31/2022 0920   MCV 95 (A) 02/27/2021 0000   MCH 33.6 03/12/2023 0558   MCHC 34.9 03/12/2023 0558   RDW 13.5 03/12/2023 0558   RDW 11.8 01/31/2022 0920   LYMPHSABS 1.5 01/12/2020 1005   MONOABS 0.3 01/22/2007 0923   EOSABS 0.2 01/12/2020 1005   BASOSABS 0.0 01/12/2020 1005    BMET    Component Value Date/Time   NA 131 (L) 03/12/2023 0558   NA 136 02/07/2023 1007   K 3.8 03/12/2023 0558   CL 98 03/12/2023 0558   CO2 25 03/12/2023 0558   GLUCOSE 108 (H) 03/12/2023 0558   BUN 15 03/12/2023 0558   BUN 27 02/07/2023 1007   CREATININE 0.84 03/12/2023 0558   CREATININE 1.26 (H) 12/20/2022 0949   CALCIUM 8.4 (L) 03/12/2023 0558   GFRNONAA >60 03/12/2023 0558   GFRNONAA 57 (L) 12/20/2022 0949   GFRAA 76 11/07/2020 1442    INR    Component Value Date/Time   INR 1.2 03/11/2023 1742     Intake/Output Summary (Last 24 hours) at 03/16/2023 0846 Last data filed at 03/16/2023 0834 Gross per 24 hour  Intake 1666.19 ml  Output 675 ml  Net 991.19 ml     Assessment/Plan:  82 y.o. male is s/p Transposition of right SCA to right CCA with ligation of Kommerell Diverticulum, thoracic aortic endovascular stent  graft, stent of left CIA and EIA  5 Days Post-Op   Groin and neck incisions all appear to be healing well.  Feet are warm and well-perfused.  IR was unable to place core track under fluoroscopy yesterday.  We will ask IR to place a PEG tube.  Patient is agreeable.  If IR unable to place PEG we will involve general surgery.  Plavix is being held, it is unclear when his last dose was given his ongoing swallowing issues/dysphagia.   Emilie Rutter, PA-C [facility] [phone] 03/16/2023 8:46 AM  I have independently interviewed and examined patient and agree with PA assessment and plan above.  Unfortunately with oropharyngeal dysphagia as well as esophageal component will likely require PEG tube and will ask IR to place this week if not would need surgical evaluation.  Danity Schmelzer C. Randie Heinz, MD [facility] [place] Office: [phone] Pager: [phone]

## 2023-03-16 NOTE — Progress Notes (Signed)
Mobility Specialist Progress Note   03/16/23 1055  Mobility  Activity Ambulated with assistance in hallway  Level of Assistance Contact guard assist, steadying assist  Assistive Device Front wheel walker  Distance Ambulated (ft) 200 ft  Range of Motion/Exercises Active;All extremities  Activity Response Tolerated well   Patient received in supine and agreeable to participate. Completed bed mobility independently, stood with minimal HHA and ambulated min guard with steady gait. Returned to room without complaint or incident. Was left in supine with all needs met, call bell in reach.   Brian Dunn, BS EXP Mobility Specialist Please contact via SecureChat or Rehab office at [phone]

## 2023-03-17 ENCOUNTER — Other Ambulatory Visit: Payer: Self-pay

## 2023-03-17 ENCOUNTER — Inpatient Hospital Stay (HOSPITAL_COMMUNITY): Payer: Medicare HMO

## 2023-03-17 MED ORDER — SODIUM CHLORIDE 0.9% FLUSH
10.0000 mL | Freq: Two times a day (BID) | INTRAVENOUS | Status: DC
  Administered 2023-03-17: 10 mL
  Administered 2023-03-18: 20 mL
  Administered 2023-03-18 – 2023-03-20 (×3): 10 mL
  Administered 2023-03-20: 20 mL
  Administered 2023-03-21 – 2023-03-23 (×5): 10 mL

## 2023-03-17 MED ORDER — CHLORHEXIDINE GLUCONATE CLOTH 2 % EX PADS
6.0000 | MEDICATED_PAD | Freq: Every day | CUTANEOUS | Status: DC
  Administered 2023-03-18 – 2023-03-23 (×6): 6 via TOPICAL

## 2023-03-17 MED ORDER — SODIUM CHLORIDE 0.9% FLUSH
10.0000 mL | INTRAVENOUS | Status: DC | PRN
  Administered 2023-03-19: 40 mL

## 2023-03-17 NOTE — Consult Note (Signed)
[facility] Oct 23, 1941  [id number].    Requesting MD: Brian Heinz, MD Chief Complaint/Reason for Consult: dysphagia/esophageal stricture in the setting of Kommerell diverticulum  HPI:  Brian Dunn is a 82 y/o M with PMH CAD who presented with weight loss and trouble swallowing. He was evaluated by Dr. Randie Dunn due to aberrant R subclavian artery with 2 cm Kommerell diverticulum. He was admitted to the hospital 5/7 and underwent R subclavian artery transposition, ligation of diverticulum, and thoracic aortic endovascular stent graft. Post-operatively he continues to have severe dysphagia placing him at risk for aspiration so IR was consulted for percutaneous gastrostomy tube. CT abd shows colon overlying the stomach so surgery is asked to consider PEG vs surgical gastrostomy tube. He is currently NPO and does not have an NGT/cortrak because they could not successfully be passed into the stomach. Patient has ASA/plavix ordered due to recent vascular procedure but has been unable to take anything by mouth so has not received these meds for 3 days, last dose 5/10. The patient tells me his last esophageal dilation was 2022 in [place]. He has had bilateral inguinal hernia repairs and denies a history of any other abdominal surgery. He reports a non-severe PCN allergy.   ROS: Review of Systems  All other systems reviewed and are negative.   Family History  Problem Relation Age of Onset   Parkinsonism Father    Asthma Father    Throat cancer Sister    Asthma Sister    Heart disease Mother    Heart disease Sister     Past Medical History:  Diagnosis Date   Acute bronchitis 05/29/2012   Allergic rhinitis    Anemia in chronic kidney disease 02/27/2021   Aneurysm of right subclavian artery (HCC) 02/07/2023   Angina pectoris (HCC) 11/07/2020   Angina, class III (HCC) 11/07/2020   Asthma 06/18/2012   Followed in Pulmonary clinic/ [facility]/ [place]  - 01/09/2018  After extensive coaching  inhaler device  effectiveness =   75% > continue symb 160 2bid and return for pfts in 3 months ? RA bronchiolitis?  - PFT's  04/23/2018  FEV1 2.84 (91 % ) ratio 73  p no % improvement from saba p symb 160 prior to study with DLCO  76 % corrects to 79  % for alv volume   - 04/23/2018  After extensive coaching   Benign essential hypertension 06/20/2016   Bilateral inguinal hernia without obstruction or gangrene 06/20/2016   CAD (coronary artery disease), native coronary artery 06/20/2016   Chest pain, unspecified    Chronic ischemic heart disease, unspecified 01/28/2022   Chronic kidney disease 01/28/2022   COPD (chronic obstructive pulmonary disease) (HCC)    Coronary artery disease involving native coronary artery of native heart with angina pectoris (HCC) 06/20/2016   Cough 03/25/2012   Followed in Pulmonary clinic/ [facility]/ [place]    - Trial off acei again  03/25/2012    - Sinus CT 01/24/11 There is mucosal thickening within the paranasal sinuses without  air-fluid levels. Neither ostiomeatal unit is patent.    Cough variant asthma 10/18/2010   Followed in Pulmonary clinic/ [facility]/ [place]  -HFA 75% p coaching 01/22/2011  -PFT's  02/04/2011 minimal airflow obst, nl dlco    Disorder of bone and cartilage 01/22/2007   Qualifier: Diagnosis of  By: Drue Novel MD, Nolon Rod.   Overview:  Overview:  Qualifier: Diagnosis of  By: Drue Novel MD, Nolon Rod.   Dysphagia 12/27/2022  Onset ? Aug 2022 p febrile illness ? Asp pna   -  MBS 01/13/23   PO Diet Recommendation: Regular; Thin liquids (Level 0)  Liquid Administration via: Cup; Spoon; Straw  Supervision: Patient able to self-feed  Postural changes: -- (try reclining when sipping liquids)  Oral care recommendations: Oral care QID (4x/day); Oral care before ice   chips/water  Recommended consults: Consider ENT consultation   Encounter for fitting and adjustment of hearing aid 01/29/2022   Essential hypertension 11/06/2017   GERD 01/22/2007   Annotation: history  of esophageal stricture Qualifier: Diagnosis of  By: Drue Novel MD, Nolon Rod.    GERD (gastroesophageal reflux disease)    Gout 01/28/2022   HIP FRACTURE 01/22/2007   Annotation: right-sided status post surgery Qualifier: Diagnosis of  By: Drue Novel MD, Nolon Rod.    Hip fracture (HCC)    Hyperlipidemia 06/20/2016   Hyperlipidemia with target LDL less than 70 06/20/2016   Hypertension    HYPOGONADISM 02/18/2007   Qualifier: Diagnosis of  By: Drue Novel MD, Nolon Rod.    Hypogonadism male    Low back pain 09/09/2013   Multiple lung nodules on CT 11/21/2015   CT [place] 10/17/15 mpns > 15 y since quit smoking > rec 12 m f/u as this is low risk > done 06/04/16 no change rec recheck in 84m  - CT [place] 12/08/17 no change nodules > meets benign criteria > no directed f/u - Quantiferon GOLD TB 01/09/18 neg    Osteopenia    Personal history of malignant neoplasm of prostate    Postoperative visit 08/14/2016   Sensorineural hearing loss, bilateral 01/28/2022   Tremor, essential 02/18/2007   Overview:  Overview:  Qualifier: Diagnosis of  By: Drue Novel MD, Nolon Rod.  Last Assessment & Plan:  Reviewed tremor may increase on dulera so will need to be balanced against benefits to cough    Past Surgical History:  Procedure Laterality Date   CAROTID-SUBCLAVIAN BYPASS GRAFT Right 03/11/2023   Procedure: RIGHT SUBCLAVIAN-CAROTID TRANSPOSITION;  Surgeon: Brian Harman, MD;  Location[facility] OR;  Service: Vascular;  Laterality: Right;   CORONARY ANGIOPLASTY WITH STENT PLACEMENT  11/04/2006   CORONARY STENT INTERVENTION N/A 11/16/2020   Procedure: CORONARY STENT INTERVENTION;  Surgeon: Marykay Lex, MD;  Location: [facility] INVASIVE CV LAB;  Service: Cardiovascular;  Laterality: N/A;   EYE SURGERY Bilateral 11/2022   cataract removal   INSERTION OF ILIAC STENT Left 03/11/2023   Procedure: INSERTION OF LEFT ILIAC STENT USING VIABAHN VBX X 54. STENT;  Surgeon: Brian Harman, MD;  Location: [facility] OR;  Service: Vascular;   Laterality: Left;   INSERTION PROSTATE RADIATION SEED     LEFT HEART CATH AND CORONARY ANGIOGRAPHY N/A 11/16/2020   Procedure: LEFT HEART CATH AND CORONARY ANGIOGRAPHY;  Surgeon: Marykay Lex, MD;  Location: [facility] INVASIVE CV LAB;  Service: Cardiovascular;  Laterality: N/A;   THORACIC AORTIC ENDOVASCULAR STENT GRAFT Bilateral 03/11/2023   Procedure: THORACIC AORTIC ENDOVASCULAR STENT GRAFT USING ZENITH ALPHA X STENT (COOK);  Surgeon: Brian Harman, MD;  Location: [facility] OR;  Service: Vascular;  Laterality: Bilateral;   ULTRASOUND GUIDANCE FOR VASCULAR ACCESS Bilateral 03/11/2023   Procedure: ULTRASOUND GUIDANCE FOR VASCULAR ACCESS;  Surgeon: Brian Harman, MD;  Location: [facility] OR;  Service: Vascular;  Laterality: Bilateral;    Social History:  reports that he quit smoking about 26 years ago. His smoking use included cigarettes. He has a 35.00 pack-year smoking history. He has never used  smokeless tobacco. He reports that he does not currently use alcohol. He reports that he does not use drugs.  Allergies:  Allergies  Allergen Reactions   Metoprolol Other (See Comments)    Weight gain   Penicillins Swelling   Morphine Rash    Medications Prior to Admission  Medication Sig Dispense Refill   allopurinol (ZYLOPRIM) 100 MG tablet Take 1 tablet by mouth daily.      ascorbic acid (VITAMIN C) 500 MG tablet Take 1 tablet by mouth daily.     aspirin EC 81 MG tablet Take 1 tablet (81 mg total) by mouth daily. 90 tablet 3   budesonide-formoterol (SYMBICORT) 160-4.5 MCG/ACT inhaler Inhale 2 puffs into the lungs daily.     Calcium Carbonate-Vitamin D (OYSTER SHELL CALCIUM/D) 500-5 MG-MCG TABS Take 1 tablet by mouth daily.     celecoxib (CELEBREX) 200 MG capsule Take 200 mg by mouth daily.      Coenzyme Q10 (CO Q-10) 75 MG CAPS Take 120 mg by mouth daily.     famotidine (PEPCID) 40 MG tablet Take 40 mg by mouth daily.     ferrous sulfate 325 (65 FE) MG tablet Take 1 tablet by  mouth 2 (two) times daily.     fluticasone (FLONASE) 50 MCG/ACT nasal spray Place 2 sprays into the nose daily.     irbesartan (AVAPRO) 300 MG tablet Take 300 mg by mouth daily as needed (blood pressure).     isosorbide mononitrate (IMDUR) 30 MG 24 hr tablet Take 0.5 tablets (15 mg total) by mouth daily. 45 tablet 3   loratadine (CLARITIN) 10 MG tablet Take 10 mg by mouth daily.      Magnesium 400 MG CAPS Take 400 mg by mouth 2 (two) times daily.     Multiple Vitamin (MULTIVITAMIN) capsule Take 1 capsule by mouth daily.     pantoprazole (PROTONIX) 40 MG tablet Take 40 mg by mouth every morning.     pravastatin (PRAVACHOL) 40 MG tablet TAKE 1 TABLET BY MOUTH EVERY DAY 90 tablet 0   primidone (MYSOLINE) 50 MG tablet Take 50 mg by mouth at bedtime.      tamsulosin (FLOMAX) 0.4 MG CAPS capsule Take 0.4 mg by mouth daily.     traMADol (ULTRAM) 50 MG tablet Take 50 mg by mouth 2 (two) times daily as needed for moderate pain.     vitamin B-12 (CYANOCOBALAMIN) 500 MCG tablet Take 500 mcg by mouth daily.     albuterol (PROAIR HFA) 108 (90 BASE) MCG/ACT inhaler Inhale 2 puffs into the lungs every 6 (six) hours as needed for wheezing. 1 Inhaler 3   clopidogrel (PLAVIX) 75 MG tablet TAKE 1 TABLET BY MOUTH EVERY DAY WITH BREAKFAST (Patient not taking: Reported on 03/07/2023) 90 tablet 3   nitroGLYCERIN (NITROSTAT) 0.4 MG SL tablet Place 1 tablet (0.4 mg total) under the tongue every 5 (five) minutes as needed for chest pain. 25 tablet 11     Physical Exam: Blood pressure 126/64, pulse 66, temperature 98.2 F (36.8 C), temperature source Oral, resp. rate 18, height 5\' 9"  (1.753 m), weight 59.8 kg, SpO2 92 %. General: Pleasant elderly male laying on hospital bed, appears stated age, NAD, chronically ill appearing HEENT: head -normocephalic, atraumatic; Eyes: PERRLA, no conjunctival injection.  Neck- Trachea is midline, no thyromegaly or JVD appreciated.  CV- RRR, normal S1/S2, no M/R/G Pulm- breathing is  slightly labored with speech, CTABL Abd- soft, thin, NT/ND, no masses, hernias, or organomegaly. GU- deferred  MSK-  UE/LE symmetrical, no cyanosis, clubbing, or edema. Neuro- non-focal exam, gait not assessed  Psych- Alert and Oriented x3 with appropriate affect Skin: warm and dry, no rashes or lesions   No results found for this or any previous visit (from the past 48 hour(s)). [place] EKG SITE RITE  Result Date: 03/17/2023 If Site Rite image not attached, placement could not be confirmed due to current cardiac rhythm.  CT ABDOMEN WO CONTRAST  Result Date: 03/16/2023 CLINICAL DATA:  Significant dysphagia with inability to place standard feeding tube. Evaluation of gastric and abdominal anatomy for possible gastrostomy tube placement. EXAM: CT ABDOMEN WITHOUT CONTRAST TECHNIQUE: Multidetector CT imaging of the abdomen was performed following the standard protocol without IV contrast. RADIATION DOSE REDUCTION: This exam was performed according to the departmental dose-optimization program which includes automated exposure control, adjustment of the mA and/or kV according to patient size and/or use of iterative reconstruction technique. COMPARISON:  CT of the abdomen on 04/24/2022 at [facility] FINDINGS: Lower chest: Trace bilateral pleural effusions and dense atelectasis at the right lung base. Hepatobiliary: Unremarkable unenhanced appearance of the liver. Density in the gallbladder is consistent with some mixture of vicariously excreted contrast with bile. No biliary ductal dilatation identified. Pancreas: Unremarkable. No pancreatic ductal dilatation or surrounding inflammatory changes. Spleen: Normal in size without focal abnormality. Adrenals/Urinary Tract: Adrenal glands are unremarkable. Kidneys are normal, without renal calculi, focal lesion, or hydronephrosis. Stomach/Bowel: No hiatal hernia. The stomach is decompressed. The body and antrum of the stomach are located posterior to the  transverse colon which is interposed between the stomach and anterior abdominal wall. No evidence of bowel obstruction or significant ileus. No free intraperitoneal air identified. Vascular/Lymphatic: Diffuse atherosclerosis of the abdominal aorta without aneurysm. No lymphadenopathy identified. Other: No ascites, focal fluid collections or anasarca. No abdominal wall hernia. Musculoskeletal: Stable degenerative disc disease of the lumbar spine and loss of height of the L1 vertebral body. IMPRESSION: 1. The body and antrum of the stomach are located posterior to the transverse colon which is interposed between the stomach and anterior abdominal wall. The patient is not a candidate for percutaneous gastrostomy tube placement due to high risk of colonic injury. Recommend general surgical consultation for surgical gastrostomy versus jejunostomy if in continued need of supplemental nutrition due to dysphagia. 2. Trace bilateral pleural effusions and dense atelectasis at the right lung base. 3. Atherosclerosis of the abdominal aorta without aneurysm. 4. Stable degenerative disc disease of the lumbar spine and loss of height of the L1 vertebral body. Aortic Atherosclerosis (ICD10-I70.0). Electronically Signed   By: Irish Lack M.D.   On: 03/16/2023 12:34     Assessment/Plan 82 y/o M s/p R SCA transposition for Kommerell diverticulum 5/7 who has persistent dysphagia. - agree that he would benefit from gastrostomy tube for enteral nutrition until his dysphagia improves. PEG may be difficult given his history of esophageal strictures and he may ultimately require surgical gastrostomy tube. Continue to hold plavix in anticipation for G tube. Will make final surgical plan after talking to my attending.  FEN - NPO, IVF VTE - SCDs, SQH ID - none currently Admit - vascular surgery service    I reviewed nursing notes, Consultant vascular notes, last 24 h vitals and pain scores, last 48 h intake and output, last 24  h labs and trends, and last 24 h imaging results.  Adam Phenix, [facility] Surgery 03/17/2023, 2:44 PM Please see Amion for pager number during day hours 7:00am-4:30pm or 7:00am -  11:30am on weekends

## 2023-03-17 NOTE — Progress Notes (Signed)
Mobility Specialist Progress Note:    03/17/23 1016  Mobility  Activity Ambulated with assistance in hallway  Level of Assistance Contact guard assist, steadying assist  Assistive Device Front wheel walker  Distance Ambulated (ft) 100 ft  Activity Response Tolerated well  Mobility Referral Yes  $Mobility charge 1 Mobility  Mobility Specialist Start Time (ACUTE ONLY) 0945  Mobility Specialist Stop Time (ACUTE ONLY) 1000  Mobility Specialist Time Calculation (min) (ACUTE ONLY) 15 min   Pt received in bed, agreeable to ambulate. Ambulated with 2L O2 d/t SPO2 noted to be 80% on RA upon arrival.   Pt c/o SOB other than that no complaints. Pt assisted back in bed with call bell at hand and bed alarm on.   Pre Mobility SPO2 80% RA During Mobility 2L O2 Post Mobility SPO2 92% RA  Thompson Grayer Mobility Specialist  Please contact vis Secure Chat or  Rehab Office [phone]

## 2023-03-17 NOTE — Progress Notes (Signed)
PHARMACY - TOTAL PARENTERAL NUTRITION CONSULT NOTE  Indication:  Esophageal stricture with failed attempts at placing enteral access.  Patient Measurements: Height: 5\' 9"  (175.3 cm) Weight: 59.8 kg (131 lb 14.4 oz) IBW/kg (Calculated) : 70.7 TPN AdjBW (KG): 59.8 Body mass index is 19.48 kg/m.  Assessment:  62 YOM presented on 5/7 for transposition of R SCA to R CCA with ligation of Kommerell diverticulum, thoracic aortic endovascular stent graft, and stenting of left CIA and EIA.  Patient was started on dysphagia d diet on 5/8 and then downgraded to dysphagia 1 diet on 5/9.  Unable to place a cortrak and NG tube.  Patient has esophageal dysfunction/dysphagia and IR consulted to place a PEG tube. Pharmacy consulted to dose TPN.  Patient with severe malnutrition per documentation.   Glucose / Insulin: no hx DM  Electrolytes: 5/8 labs - low Na Renal: SCr < 1, BUN WNL Hepatic:  Intake / Output; MIVF: UOP 0.4 ml/kg/hr GI Imaging: none since TPN start GI Surgeries / Procedures:  PEG placement pending  Central access: PICC ordered TPN start date: tentatively 03/18/23  Nutritional Goals: RD Estimated Needs Total Energy Estimated Needs: 1700-1900 Total Protein Estimated Needs: 85-95 grams Total Fluid Estimated Needs: 1.5-1.8 L/day  Current Nutrition:  NPO  Plan:  Start TPN 5/14 as consult was received after hour F/U with IR timing for placement of PEG tube Standard TPN labs in AM  Brian Dunn, PharmD, BCPS, BCCCP 03/17/2023, 3:09 PM

## 2023-03-17 NOTE — Progress Notes (Signed)
Nutrition Follow-up  DOCUMENTATION CODES:   Severe malnutrition in context of chronic illness  INTERVENTION:  Start TPN conservatively and titrate to goal over the next 3-4 days   **Patient is at severe risk for refeeding. Please check Mag, phos, and K+ 3x/day for potential signs of refeeding   RD recommends 100mg  thiamine per day to avoid refeeding symptoms  Daily Weight  D/C Ensure   Communicate with MD's regarding urgent nutrition plans   NUTRITION DIAGNOSIS:   Severe Malnutrition related to chronic illness (COPD, also suspect concerns over difficulty swallowing impacting intake) as evidenced by severe fat depletion, severe muscle depletion.   GOAL:   Patient will meet greater than or equal to 90% of their needs -not met   MONITOR:   PO intake, Supplement acceptance, Diet advancement, Labs, Weight trends, I & O's  REASON FOR ASSESSMENT:   Malnutrition Screening Tool    ASSESSMENT:   82 year old male with PMHx of COPD, GERD, HTN, CAD now who presented for surgery with diagnosis of Kommerell's diverticulum s/p transposition of right SCA to right CCA with ligation of Kommerell Diverticulum, thoracic aortic endovascular stent graft, stent of left CIA and EIA on 03/11/23.  NPO x 4 days TPN being discussed Patient needs PEG or surgical G-tube for sole nutrition source   Labs: no BMP since 5/8  Meds: colace, pepcid, MVI, protonix Admit wt- 131.9#; no other weight documentation since admission   I/O's:  +6.3 L  Visited patient at bedside who has not eaten in a week. Patient has severe oropharyngeal dysphagia. NG tube placement unsuccessful in fluoro and at bedside with cortrak machine.   Patient is very upset about his nutrition care and feels like we are stalling. He is ready for a feeding tube and vascular surgery is to reach out to the attending MD to get him on IR schedule. If IR placement is unsuccessful, patient may require surgical g-tube.   Patient reports that  he cannot hold on much longer and his wife fears he will not live for another day or 2 if we do not act quickly.   RD educated patient and family about TPN as he awaits tube placement. It could be beneficial for the patient's nutrition to be repleted prior to surgery.   RD secure messaged attending MD, Dr. Randie Heinz about urge to start some form of nutrition today. Patient is severely malnourished and appears to be skin and bones. No updated weight since admission which was 131#. RD fears he weighs even less now.    Diet Order:   Diet Order             Diet NPO time specified Except for: Ice Chips  Diet effective now                   EDUCATION NEEDS:   Education needs have been addressed  Skin:  Skin Assessment: Skin Integrity Issues: Skin Integrity Issues:: Incisions Incisions: closed incisions to right chest and groin  Last BM:  5/10  Height:   Ht Readings from Last 1 Encounters:  03/11/23 5\' 9"  (1.753 m)    Weight:   Wt Readings from Last 1 Encounters:  03/11/23 59.8 kg    Ideal Body Weight:  72.7 kg  BMI:  Body mass index is 19.48 kg/m.  Estimated Nutritional Needs:   Kcal:  1850-2100 kcal  Protein:  85-95 grams  Fluid:  >1.8 L    Brian Dunn, RDN, LDN  Clinical Nutrition

## 2023-03-17 NOTE — Progress Notes (Addendum)
  Progress Note    03/17/2023 8:09 AM 6 Days Post-Op  Subjective:  no complaints   Vitals:   03/17/23 0349 03/17/23 0751  BP: (!) 153/79 (!) 149/66  Pulse: 75 72  Resp: 20   Temp: 98 F (36.7 C) 97.7 F (36.5 C)  SpO2: 90% 98%   Physical Exam Lungs:  non labored Incisions:  R neck and B groins healing well Extremities:  moving all ext well Abdomen:  soft, NT, ND Neurologic: A&O  CBC    Component Value Date/Time   WBC 7.0 03/12/2023 0558   RBC 2.65 (L) 03/12/2023 0558   HGB 8.9 (L) 03/12/2023 0558   HGB 11.1 (L) 01/31/2022 0920   HCT 25.5 (L) 03/12/2023 0558   HCT 32.7 (L) 01/31/2022 0920   PLT 159 03/12/2023 0558   PLT 262 01/31/2022 0920   MCV 96.2 03/12/2023 0558   MCV 96 01/31/2022 0920   MCV 95 (A) 02/27/2021 0000   MCH 33.6 03/12/2023 0558   MCHC 34.9 03/12/2023 0558   RDW 13.5 03/12/2023 0558   RDW 11.8 01/31/2022 0920   LYMPHSABS 1.5 01/12/2020 1005   MONOABS 0.3 01/22/2007 0923   EOSABS 0.2 01/12/2020 1005   BASOSABS 0.0 01/12/2020 1005    BMET    Component Value Date/Time   NA 131 (L) 03/12/2023 0558   NA 136 02/07/2023 1007   K 3.8 03/12/2023 0558   CL 98 03/12/2023 0558   CO2 25 03/12/2023 0558   GLUCOSE 108 (H) 03/12/2023 0558   BUN 15 03/12/2023 0558   BUN 27 02/07/2023 1007   CREATININE 0.84 03/12/2023 0558   CREATININE 1.26 (H) 12/20/2022 0949   CALCIUM 8.4 (L) 03/12/2023 0558   GFRNONAA >60 03/12/2023 0558   GFRNONAA 57 (L) 12/20/2022 0949   GFRAA 76 11/07/2020 1442    INR    Component Value Date/Time   INR 1.2 03/11/2023 1742     Intake/Output Summary (Last 24 hours) at 03/17/2023 0809 Last data filed at 03/17/2023 0700 Gross per 24 hour  Intake 1415.7 ml  Output 975 ml  Net 440.7 ml     Assessment/Plan:  82 y.o. male is s/p  Transposition of right SCA to right CCA with ligation of Kommerell Diverticulum, thoracic aortic endovascular stent graft, stent of left CIA and EIA  6 Days Post-Op   Incisions are nearly  healed.  Unfortunately with oropharyngeal dysphagia as well as esophageal component will likely require PEG tube and will ask IR to place.  If IR unable to place, we will involve surgery team.  Continue NPO.  Will also discuss TPN with attending.   Emilie Rutter, PA-C [facility] [phone] 03/17/2023 8:09 AM  I have independently interviewed and examined patient and agree with PA assessment and plan above.  Palpable right radial pulse with minimal hematoma right neck.  Patient frustrated over lack of nutrition high risk for aspiration I explained this to him.  We are going to begin with TPN from the PICC line and we have asked general surgery to consider surgical G or J-tube placement given inability for percutaneous placement.  Patient and family agreeable with plan.  Marcena Dias C. Randie Heinz, MD [facility] [place] Office: [phone] Pager: [phone]

## 2023-03-17 NOTE — Progress Notes (Signed)
Speech Language Pathology Treatment: Dysphagia  Patient Details Name: Brian Dunn MRN: [id number] DOB: 02-21-1941 Today's Date: 03/17/2023 Time: 4098-1191 SLP Time Calculation (min) (ACUTE ONLY): 32 min  Assessment / Plan / Recommendation Clinical Impression  Reviewed images from 5/10 MBS with Mr. and Mrs. Dunn. We discussed the multiple factors impacting swallowing.  Brian Dunn questioned if he may have had a stroke at some point.  He has not had imaging of his brain, but his presentation is complex and would not be inconsistent with a discreet medullary stroke.   He practiced several exercises today - the [facility] and effortful swallow. I advised him to hold on Shaker and chin tuck against resistance as he reported neck pain.   We discussed our plan: 1) G-tube is a good decision, providing nutrition and eliminating the effort and time Brian Dunn has had to devote to eating.  He endorses how exhausting it has been to eat even small amounts of food; 2) continue pharyngeal exercises as able, focusing on strengthening base-of-tongue and pharyngeal squeeze to help move POs through pharynx; 3) continue ice chips for now with hope to advance to limited POs for pleasure at time of D/C; 4) plan to keep OP appt with ENT at [facility] [place], scheduled in June; 5) OP SLP for ongoing dysphagia management after D/C.  SLP will continue to follow while admitted; hopefully as he heals from surgery his esophageal function will improve.  Brian Dunn agree with plan.    HPI HPI: Pt is an 82 y/o male who presented for surgery, with the diagnosis of Kommerell's diverticulum. Pt s/p transposition of right SCA to right CCA with ligation of Kommerell Diverticulum, thoracic aortic endovascular stent graft, stent of left CIA and EIA 5/7.  PMH: vascular disease and CAD. MBS 01/13/23: lordosis of his cervical spine, thinning of the prevertebral space, and what appears to be hypertrophy in the  mid-superior pharynx (presents similarly to CP bar but located anatomically higher). Poor base-of-tongue to pharyngeal wall contact and the epiglottis is unable to invert past its horizontal position. There is good laryngeal elevation; the arytenoids never fully meet the epiglottic petiole, minimal distension of the PES. Factors led to significant residue that accumulates in the pharynx, regardess of consistency, and is aspirated either during or after the swallow response. A regular texture diet with thin liquids was recommended with observance of swallowing precautions considering the severity of symptoms across consistencies. ENT referral at [facility] and outpatient SLP services recommended. Per Dr. Randie Heinz,  CT scan appeared to show the diverticulum to be compressing the esophagus against the trachea.  He discussed with the patient and his family at bedside that this is very likely at least a contributing factor to his dysphagia given the large size of the diverticulum relative to the small area where it existed.      SLP Plan  Continue with current plan of care      Recommendations for follow up therapy are one component of a multi-disciplinary discharge planning process, led by the attending physician.  Recommendations may be updated based on patient status, additional functional criteria and insurance authorization.    Recommendations  Diet recommendations: NPO Medication Administration: Via alternative means                  Oral care QID;Oral care prior to ice chip/H20   Intermittent Supervision/Assistance Dysphagia, pharyngoesophageal phase (R13.14)     Continue with current plan of care  Brian Record L. Samson Frederic, MA CCC/SLP Clinical Specialist - Acute Care SLP Acute Rehabilitation Services Office number [phone]   Brian Dunn  03/17/2023, 12:58 PM

## 2023-03-17 NOTE — Progress Notes (Signed)
Urgent PICC ordered for TPN.  Per Will Dixon, RPH TPN will not be made today due to TPN order cut off being at 1230.  Message to Dr. Donnie Mesa, PA and bedside RN Marcelino Duster with above information.  Per Marcelino Duster, RN patient has adequate access for current medications.  PICC will be inserted prior to TPN hang 5-14.

## 2023-03-17 NOTE — Progress Notes (Signed)
Peripherally Inserted Central Catheter Placement  The IV Nurse has discussed with the patient and/or persons authorized to consent for the patient, the purpose of this procedure and the potential benefits and risks involved with this procedure.  The benefits include less needle sticks, lab draws from the catheter, and the patient may be discharged home with the catheter. Risks include, but not limited to, infection, bleeding, blood clot (thrombus formation), and puncture of an artery; nerve damage and irregular heartbeat and possibility to perform a PICC exchange if needed/ordered by physician.  Alternatives to this procedure were also discussed.  Bard Power PICC patient education guide, fact sheet on infection prevention and patient information card has been provided to patient /or left at bedside.  Difficult to get PICC to drop to SVC.  CXR ordered for tip placement.  PICC Placement Documentation  PICC Double Lumen 03/17/23 Left Basilic 45 cm 0 cm (Active)  Indication for Insertion or Continuance of Line Administration of hyperosmolar/irritating solutions (i.e. TPN, Vancomycin, etc.) 03/17/23 2250  Exposed Catheter (cm) 0 cm 03/17/23 2250  Site Assessment Clean, Dry, Intact 03/17/23 2250  Lumen #1 Status Flushed;Saline locked;Blood return noted 03/17/23 2250  Lumen #2 Status Flushed;Saline locked;Blood return noted 03/17/23 2250  Dressing Type Transparent;Securing device 03/17/23 2250  Dressing Status Antimicrobial disc in place;Clean, Dry, Intact 03/17/23 2250  Safety Lock Not Applicable 03/17/23 2250  Line Care Connections checked and tightened 03/17/23 2250  Line Adjustment (NICU/IV Team Only) No 03/17/23 2250  Dressing Intervention New dressing 03/17/23 2250  Dressing Change Due 03/24/23 03/17/23 2250       Aniello Christopoulos, Lajean Manes 03/17/2023, 10:53 PM

## 2023-03-18 LAB — COMPREHENSIVE METABOLIC PANEL
ALT: 16 U/L (ref 0–44)
AST: 20 U/L (ref 15–41)
Albumin: 2.4 g/dL — ABNORMAL LOW (ref 3.5–5.0)
Alkaline Phosphatase: 51 U/L (ref 38–126)
Anion gap: 15 (ref 5–15)
BUN: 18 mg/dL (ref 8–23)
CO2: 22 mmol/L (ref 22–32)
Calcium: 8.4 mg/dL — ABNORMAL LOW (ref 8.9–10.3)
Chloride: 100 mmol/L (ref 98–111)
Creatinine, Ser: 0.93 mg/dL (ref 0.61–1.24)
GFR, Estimated: >60 mL/min (ref >60)
Glucose, Bld: 86 mg/dL (ref 70–99)
Potassium: 3.2 mmol/L — ABNORMAL LOW (ref 3.5–5.1)
Sodium: 137 mmol/L (ref 135–145)
Total Bilirubin: 1.4 mg/dL — ABNORMAL HIGH (ref 0.3–1.2)
Total Protein: 6.1 g/dL — ABNORMAL LOW (ref 6.5–8.1)

## 2023-03-18 LAB — MAGNESIUM: Magnesium: 1.7 mg/dL (ref 1.7–2.4)

## 2023-03-18 LAB — PHOSPHORUS: Phosphorus: 3.4 mg/dL (ref 2.5–4.6)

## 2023-03-18 LAB — TRIGLYCERIDES: Triglycerides: 54 mg/dL (ref <150)

## 2023-03-18 MED ORDER — POTASSIUM CHLORIDE 10 MEQ/50ML IV SOLN
10.0000 meq | INTRAVENOUS | Status: AC
  Administered 2023-03-18 (×6): 10 meq via INTRAVENOUS
  Filled 2023-03-18 (×6): qty 50

## 2023-03-18 MED ORDER — THIAMINE HCL 100 MG/ML IJ SOLN
100.0000 mg | Freq: Once | INTRAMUSCULAR | Status: AC
  Administered 2023-03-18: 100 mg via INTRAVENOUS
  Filled 2023-03-18: qty 2

## 2023-03-18 MED ORDER — INSULIN ASPART 100 UNIT/ML IJ SOLN
0.0000 [IU] | Freq: Four times a day (QID) | INTRAMUSCULAR | Status: DC
  Administered 2023-03-19: 1 [IU] via SUBCUTANEOUS
  Administered 2023-03-19: 2 [IU] via SUBCUTANEOUS
  Administered 2023-03-19: 1 [IU] via SUBCUTANEOUS

## 2023-03-18 MED ORDER — THIAMINE HCL 100 MG/ML IJ SOLN
100.0000 mg | INTRAMUSCULAR | Status: DC
  Administered 2023-03-19: 100 mg via INTRAVENOUS
  Filled 2023-03-18: qty 2

## 2023-03-18 MED ORDER — MAGNESIUM SULFATE 50 % IJ SOLN
3.0000 g | Freq: Once | INTRAVENOUS | Status: AC
  Administered 2023-03-18: 3 g via INTRAVENOUS
  Filled 2023-03-18: qty 6

## 2023-03-18 MED ORDER — DEXTROSE-NACL 5-0.9 % IV SOLN: INTRAVENOUS | Status: AC

## 2023-03-18 MED ORDER — ASPIRIN 300 MG RE SUPP
300.0000 mg | Freq: Every day | RECTAL | Status: DC
  Administered 2023-03-18: 300 mg via RECTAL
  Filled 2023-03-18 (×3): qty 1

## 2023-03-18 MED ORDER — TRAVASOL 10 % IV SOLN
INTRAVENOUS | Status: AC
  Filled 2023-03-18: qty 352.8

## 2023-03-18 MED ORDER — LIDOCAINE 5 % EX PTCH
1.0000 | MEDICATED_PATCH | CUTANEOUS | Status: DC
  Administered 2023-03-18 – 2023-03-24 (×7): 1 via TRANSDERMAL
  Filled 2023-03-18 (×7): qty 1

## 2023-03-18 MED ORDER — CEFAZOLIN SODIUM-DEXTROSE 2-4 GM/100ML-% IV SOLN
2.0000 g | INTRAVENOUS | Status: AC
  Administered 2023-03-19: 2 g via INTRAVENOUS

## 2023-03-18 NOTE — Progress Notes (Addendum)
  Progress Note    03/18/2023 7:46 AM 7 Days Post-Op  Subjective:  R sided neck pain   Vitals:   03/18/23 0245 03/18/23 0722  BP: (!) 146/75 (!) 152/77  Pulse: 72 81  Resp: 20 16  Temp: 99.9 F (37.7 C) 98.3 F (36.8 C)  SpO2: 96% 97%   Physical Exam: Lungs:  non labored Incisions:  R chest incision c/d/i Extremities:  B groin cath sites without hematoma Neurologic: A&O  CBC    Component Value Date/Time   WBC 7.0 03/12/2023 0558   RBC 2.65 (L) 03/12/2023 0558   HGB 8.9 (L) 03/12/2023 0558   HGB 11.1 (L) 01/31/2022 0920   HCT 25.5 (L) 03/12/2023 0558   HCT 32.7 (L) 01/31/2022 0920   PLT 159 03/12/2023 0558   PLT 262 01/31/2022 0920   MCV 96.2 03/12/2023 0558   MCV 96 01/31/2022 0920   MCV 95 (A) 02/27/2021 0000   MCH 33.6 03/12/2023 0558   MCHC 34.9 03/12/2023 0558   RDW 13.5 03/12/2023 0558   RDW 11.8 01/31/2022 0920   LYMPHSABS 1.5 01/12/2020 1005   MONOABS 0.3 01/22/2007 0923   EOSABS 0.2 01/12/2020 1005   BASOSABS 0.0 01/12/2020 1005    BMET    Component Value Date/Time   NA 137 03/18/2023 0646   NA 136 02/07/2023 1007   K 3.2 (L) 03/18/2023 0646   CL 100 03/18/2023 0646   CO2 22 03/18/2023 0646   GLUCOSE 86 03/18/2023 0646   BUN 18 03/18/2023 0646   BUN 27 02/07/2023 1007   CREATININE 0.93 03/18/2023 0646   CREATININE 1.26 (H) 12/20/2022 0949   CALCIUM 8.4 (L) 03/18/2023 0646   GFRNONAA >60 03/18/2023 0646   GFRNONAA 57 (L) 12/20/2022 0949   GFRAA 76 11/07/2020 1442    INR    Component Value Date/Time   INR 1.2 03/11/2023 1742     Intake/Output Summary (Last 24 hours) at 03/18/2023 0746 Last data filed at 03/17/2023 1159 Gross per 24 hour  Intake --  Output 100 ml  Net -100 ml     Assessment/Plan:  82 y.o. male is s/p Transposition of right SCA to right CCA with ligation of Kommerell Diverticulum, thoracic aortic endovascular stent graft, stent of left CIA and EIA  7 Days Post-Op   R neck incision healing well; symmetrical  radial pulses.  Will try lidocaine patch for neck pain.  TPN ordered and will start today.  PICC ordered.  Surgery evaluating for PEG placement.   Emilie Rutter, PA-C [facility] [phone] 03/18/2023 7:46 AM   I have independently interviewed and examined patient and agree with PA assessment and plan above. Having fevers and neck pain. Will check cbc in a.m..  Kristiana Jacko C. Randie Heinz, MD [facility] [place] Office: [phone] Pager: [phone]

## 2023-03-18 NOTE — Progress Notes (Signed)
  7 Days Post-Op   Chief Complaint/Subjective: Persistent swallowing issues  Objective: Vital signs in last 24 hours: Temp:  [98.2 F (36.8 C)-101.4 F (38.6 C)] 98.3 F (36.8 C) (05/14 0722) Pulse Rate:  [66-86] 81 (05/14 0722) Resp:  [16-20] 16 (05/14 0722) BP: (126-160)/(62-80) 152/77 (05/14 0722) SpO2:  [92 %-99 %] 98 % (05/14 0816) Weight:  [56.3 kg] 56.3 kg (05/14 0456) Last BM Date : 03/14/23 Intake/Output from previous day: 05/13 0701 - 05/14 0700 In: -  Out: 100 [Urine:100]  PE: Gen: NAD Resp: nonlabored Card: RRR Abd: soft, NT  Lab Results:  No results for input(s): "WBC", "HGB", "HCT", "PLT" in the last 72 hours. Recent Labs    03/18/23 0646  NA 137  K 3.2*  CL 100  CO2 22  GLUCOSE 86  BUN 18  CREATININE 0.93  CALCIUM 8.4*   No results for input(s): "LABPROT", "INR" in the last 72 hours.    Component Value Date/Time   NA 137 03/18/2023 0646   NA 136 02/07/2023 1007   K 3.2 (L) 03/18/2023 0646   CL 100 03/18/2023 0646   CO2 22 03/18/2023 0646   GLUCOSE 86 03/18/2023 0646   BUN 18 03/18/2023 0646   BUN 27 02/07/2023 1007   CREATININE 0.93 03/18/2023 0646   CREATININE 1.26 (H) 12/20/2022 0949   CALCIUM 8.4 (L) 03/18/2023 0646   PROT 6.1 (L) 03/18/2023 0646   PROT 7.2 02/07/2023 1007   ALBUMIN 2.4 (L) 03/18/2023 0646   ALBUMIN 4.2 02/07/2023 1007   AST 20 03/18/2023 0646   AST 28 12/20/2022 0949   ALT 16 03/18/2023 0646   ALT 16 12/20/2022 0949   ALKPHOS 51 03/18/2023 0646   BILITOT 1.4 (H) 03/18/2023 0646   BILITOT 0.5 02/07/2023 1007   BILITOT 0.6 12/20/2022 0949   GFRNONAA >60 03/18/2023 0646   GFRNONAA 57 (L) 12/20/2022 0949   GFRAA 76 11/07/2020 1442    Assessment/Plan  s/p Procedure(s): RIGHT SUBCLAVIAN-CAROTID TRANSPOSITION THORACIC AORTIC ENDOVASCULAR STENT GRAFT USING ZENITH ALPHA X STENT (COOK) INSERTION OF LEFT ILIAC STENT USING VIABAHN VBX X 54. STENT ULTRASOUND GUIDANCE FOR VASCULAR ACCESS 03/11/2023     FEN - TPN, NPO VTE - heparin sq, pt has not gotten aspirin over last 3 days due to swallowing issue, will give rectal aspirin today ID - periop ancef Disposition - OR today for g tube insertion  I discussed plan with patient's wife and patient. Plan will be for endoscopic attempt which given the extrinsic compression has a 50% likelihood of success, otherwise laparoscopic means will be used for successful g tube insertion. We discussed risks of bleeding, infection, scar tissue, malfunction, and leakage. We discussed brief care of the tube. He and his wife showed good understanding and wanted to proceed.   LOS: 7 days   I reviewed last 24 h vitals and pain scores, last 48 h intake and output, last 24 h labs and trends, and last 24 h imaging results.  This care required high  level of medical decision making.   [facility] at [facility] 03/18/2023, 10:06 AM Please see Amion for pager number during day hours 7:00am-4:30pm or 7:00am -11:30am on weekends

## 2023-03-18 NOTE — Progress Notes (Signed)
Physical Therapy Treatment Patient Details Name: Brian Dunn MRN: [id number] DOB: 1941-06-09 Today's Date: 03/18/2023   History of Present Illness 82 year old male with the diagnosis of Kommerell's diverticulum. s/p 5/7 transposition of right SCA to right CCA with ligation of Kommerell Diverticulum, thoracic aortic endovascular stent graft, stent of left CIA and EIA .PMH: CAD, COPD, CKD, Dysphasgia, Essential HTN, Osteopenia,    PT Comments    Pt tolerated treatment well today. Pt was able to ambulate in hallway with RW however required more assistance today likely due to malnourishment. Pts wife states that he has not eaten in a week. Pt is scheduled for PEG tube placement today. Anticipate that once pt is able to eat then should begin to progress mobility again. No change in DC/DME recs at this time. PT will continue to follow.   Recommendations for follow up therapy are one component of a multi-disciplinary discharge planning process, led by the attending physician.  Recommendations may be updated based on patient status, additional functional criteria and insurance authorization.  Follow Up Recommendations       Assistance Recommended at Discharge Frequent or constant Supervision/Assistance  Patient can return home with the following A little help with walking and/or transfers;A little help with bathing/dressing/bathroom;Assistance with cooking/housework;Assist for transportation;Help with stairs or ramp for entrance   Equipment Recommendations  Rolling walker (2 wheels)    Recommendations for Other Services       Precautions / Restrictions Precautions Precautions: Fall Restrictions Weight Bearing Restrictions: No     Mobility  Bed Mobility Overal bed mobility: Needs Assistance Bed Mobility: Supine to Sit, Sit to Supine     Supine to sit: Supervision Sit to supine: Supervision        Transfers Overall transfer level: Needs assistance Equipment used: Rolling  walker (2 wheels) Transfers: Sit to/from Stand Sit to Stand: Min guard           General transfer comment: Cues for hand placement    Ambulation/Gait Ambulation/Gait assistance: Min guard Gait Distance (Feet): 100 Feet (1 seated rest break) Assistive device: Rolling walker (2 wheels) Gait Pattern/deviations: Step-through pattern, Decreased step length - right, Decreased step length - left, Trunk flexed Gait velocity: slowed     General Gait Details: Pt required constant cues for upright posture and proximity RW. Pt required 1 seated rest break.   Stairs             Wheelchair Mobility    Modified Rankin (Stroke Patients Only)       Balance Overall balance assessment: Mild deficits observed, not formally tested                                          Cognition Arousal/Alertness: Awake/alert Behavior During Therapy: WFL for tasks assessed/performed Overall Cognitive Status: Within Functional Limits for tasks assessed                                 General Comments: Mild impulsivity but pt following commands with slight delay [facility])        Exercises      General Comments General comments (skin integrity, edema, etc.): BP: 114/70 supine, BP: 102/60 seated      Pertinent Vitals/Pain Pain Assessment Pain Assessment: Faces Faces Pain Scale: Hurts little more Pain Location: R shoulder Pain Descriptors / Indicators: Aching,  Discomfort, Grimacing, Moaning Pain Intervention(s): Monitored during session, Limited activity within patient's tolerance, Patient requesting pain meds-RN notified    Home Living                          Prior Function            PT Goals (current goals can now be found in the care plan section) Progress towards PT goals: Progressing toward goals    Frequency    Min 1X/week      PT Plan Current plan remains appropriate    Co-evaluation              AM-PAC PT "6 Clicks"  Mobility   Outcome Measure  Help needed turning from your back to your side while in a flat bed without using bedrails?: None Help needed moving from lying on your back to sitting on the side of a flat bed without using bedrails?: None Help needed moving to and from a bed to a chair (including a wheelchair)?: None Help needed standing up from a chair using your arms (e.g., wheelchair or bedside chair)?: A Little Help needed to walk in hospital room?: A Little Help needed climbing 3-5 steps with a railing? : A Little 6 Click Score: 21    End of Session Equipment Utilized During Treatment: Gait belt Activity Tolerance: Patient tolerated treatment well Patient left: in bed;with call bell/phone within reach;with family/visitor present Nurse Communication: Mobility status PT Visit Diagnosis: Muscle weakness (generalized) (M62.81);Difficulty in walking, not elsewhere classified (R26.2)     Time: 1610-9604 PT Time Calculation (min) (ACUTE ONLY): 24 min  Charges:  $Gait Training: 23-37 mins                     Shela Nevin, PT, DPT Acute Rehab Services [phone]    Brian Dunn 03/18/2023, 1:48 PM

## 2023-03-18 NOTE — Progress Notes (Signed)
   Request made for Percutaneous gastric tube placement  CT 5/12:IMPRESSION: 1. The body and antrum of the stomach are located posterior to the transverse colon which is interposed between the stomach and anterior abdominal wall. The patient is not a candidate for percutaneous gastrostomy tube placement due to high risk of colonic injury. Recommend general surgical consultation for surgical gastrostomy versus jejunostomy if in continued need of supplemental nutrition due to dysphagia. 2. Trace bilateral pleural effusions and dense atelectasis at the right lung base. 3. Atherosclerosis of the abdominal aorta without aneurysm. 4. Stable degenerative disc disease of the lumbar spine and loss of height of the L1 vertebral body.   Pt NOT a candidate for IR G tube Please see recommendation per Dr Fredia Sorrow in CT dictation  Will cancel IR order

## 2023-03-18 NOTE — Progress Notes (Signed)
General Surgery Follow Up Note  Subjective:    Overnight Issues:   Objective:  Vital signs for last 24 hours: Temp:  [97.5 F (36.4 C)-101.1 F (38.4 C)] 98.2 F (36.8 C) (05/15 0914) Pulse Rate:  [75-93] 93 (05/15 0914) Resp:  [16-20] 18 (05/15 0914) BP: (114-151)/(65-104) 131/65 (05/15 0914) SpO2:  [95 %-100 %] 95 % (05/15 0914)  Hemodynamic parameters for last 24 hours:    Intake/Output from previous day: 05/14 0701 - 05/15 0700 In: 868.6 [I.V.:539.5; IV Piggyback:329.2] Out: 400 [Urine:400]  Intake/Output this shift: No intake/output data recorded.  Vent settings for last 24 hours:    Physical Exam:  Gen: comfortable, no distress Neuro: follows commands, alert, communicative HEENT: PERRL Neck:  limited ROM CV: RRR Pulm: unlabored breathing on RA Abd: soft, NT    GU: urine clear and yellow  Extr: wwp, no edema  Results for orders placed or performed during the hospital encounter of 03/11/23 (from the past 24 hour(s))  Glucose, capillary     Status: Abnormal   Collection Time: 03/19/23 12:05 AM  Result Value Ref Range   Glucose-Capillary 142 (H) 70 - 99 mg/dL  Magnesium     Status: None   Collection Time: 03/19/23  4:25 AM  Result Value Ref Range   Magnesium 1.9 1.7 - 2.4 mg/dL  Phosphorus     Status: Abnormal   Collection Time: 03/19/23  4:25 AM  Result Value Ref Range   Phosphorus 2.2 (L) 2.5 - 4.6 mg/dL  Basic metabolic panel     Status: Abnormal   Collection Time: 03/19/23  4:25 AM  Result Value Ref Range   Sodium 135 135 - 145 mmol/L   Potassium 3.5 3.5 - 5.1 mmol/L   Chloride 101 98 - 111 mmol/L   CO2 26 22 - 32 mmol/L   Glucose, Bld 151 (H) 70 - 99 mg/dL   BUN 15 8 - 23 mg/dL   Creatinine, Ser 4.09 0.61 - 1.24 mg/dL   Calcium 8.2 (L) 8.9 - 10.3 mg/dL   GFR, Estimated >81 >19 mL/min   Anion gap 8 5 - 15  CBC     Status: Abnormal   Collection Time: 03/19/23  4:25 AM  Result Value Ref Range   WBC 9.5 4.0 - 10.5 K/uL   RBC 2.55 (L) 4.22  - 5.81 MIL/uL   Hemoglobin 8.2 (L) 13.0 - 17.0 g/dL   HCT 14.7 (L) 82.9 - 56.2 %   MCV 97.6 80.0 - 100.0 fL   MCH 32.2 26.0 - 34.0 pg   MCHC 32.9 30.0 - 36.0 g/dL   RDW 13.0 86.5 - 78.4 %   Platelets 252 150 - 400 K/uL   nRBC 0.0 0.0 - 0.2 %  Glucose, capillary     Status: Abnormal   Collection Time: 03/19/23  4:39 AM  Result Value Ref Range   Glucose-Capillary 142 (H) 70 - 99 mg/dL   Comment 1 Document in Chart   Glucose, capillary     Status: Abnormal   Collection Time: 03/19/23  7:33 AM  Result Value Ref Range   Glucose-Capillary 119 (H) 70 - 99 mg/dL    Assessment & Plan:  Present on Admission: **None**    LOS: 8 days   Additional comments:I reviewed the patient's new clinical lab test results.   and I reviewed the patients new imaging test results.     s/p Procedure(s): RIGHT SUBCLAVIAN-CAROTID TRANSPOSITION THORACIC AORTIC ENDOVASCULAR STENT GRAFT USING ZENITH ALPHA X STENT (  COOK) INSERTION OF LEFT ILIAC STENT USING VIABAHN VBX X 54. STENT ULTRASOUND GUIDANCE FOR VASCULAR ACCESS 03/11/2023      FEN - TPN, NPO VTE - heparin sq, ASA ID - periop ancef Disposition - OR today for g tube insertion   I discussed plan with patient's wife and patient. Plan will be for endoscopic attempt which given the extrinsic compression has a 50% likelihood of success, otherwise laparoscopic vs open means will be used for successful g tube insertion. We discussed risks of esophageal perforation (h/o multiple esophageal dilations), bleeding, infection, scar tissue, malfunction, and leakage. We discussed brief care of the tube. He and his wife showed good understanding and wanted to proceed.    Diamantina Monks, MD Trauma & General Surgery Please use [email or web address] to contact on call provider  03/19/2023  *Care during the described time interval was provided by me. I have reviewed this patient's available data, including medical history, events of note, physical  examination and test results as part of my evaluation.

## 2023-03-18 NOTE — Progress Notes (Signed)
MD aware/MD at bedside

## 2023-03-18 NOTE — Progress Notes (Addendum)
PHARMACY - TOTAL PARENTERAL NUTRITION CONSULT NOTE  Indication:  Esophageal stricture with failed attempts at placing enteral access.  Patient Measurements: Height: 5\' 9"  (175.3 cm) Weight: 56.3 kg (124 lb 1.6 oz) IBW/kg (Calculated) : 70.7 TPN AdjBW (KG): 59.8 Body mass index is 18.33 kg/m. Usual weight = 175 lbs, lost weight over many years  Assessment:  70 YOM presented on 5/7 for transposition of R SCA to R CCA with ligation of Kommerell diverticulum, thoracic aortic endovascular stent graft, and stenting of left CIA and EIA.  Patient was started on dysphagia diet on 5/8 and then downgraded to dysphagia 1 diet on 5/9.  Unable to place a cortrak, NG tube and G tube.  Patient has esophageal dysfunction/dysphagia with severe malnutrition per documentation. Pharmacy consulted to dose TPN.  Patient has inadequate nutrition for 8 days since admission and PO intake was declining in the outpatient setting.  Glucose / Insulin: no hx DM  - AM glucose WNL Electrolytes: K 3.2, others WNL (Mag low normal) Renal: SCr < 1, BUN WNL Hepatic: LFTs / TG WNL, tbili 1.4, albumin 2.4 Intake / Output; MIVF: UOP 0.4 ml/kg/hr GI Imaging: none since TPN initiation GI Surgeries / Procedures: none since TPN initiation  Central access: PICC placed 03/17/23 TPN start date: 03/18/23  Nutritional Goals: RD Estimated Needs Total Energy Estimated Needs: 1850-2100 kcal Total Protein Estimated Needs: 85-95 grams Total Fluid Estimated Needs: >1.8 L  Current Nutrition:  NPO  Plan:  Initiate TPN at 30 ml/hr at 1800 (goal rate 75 ml/hr) - monitor for refeeding Electrolytes in TPN: Na 4mEq/L, K 52mEq/L, Mag 39mEq/L, Ca 71mEq/L, Phos 63mmol/L, Cl:Ac 1:1 Add daily multivitamin and trace elements to TPN (D/C PO mvi) Initiate sensitive SSI Q6H KCL x 6 runs Mag sulfate 3gm IV x 1 Thiamine 100mg  IV daily x 6 days or until TPN stable at goal rate, first dose now Standard TPN labs Mon/Thurs - labs in AM F/U Surgery  consult for Gtube vs Jtube  Harriet Bollen D. Laney Potash, PharmD, BCPS, BCCCP 03/18/2023, 8:34 AM   ===================  Addendum: RD requests starting TPN earlier than standard time, but unable to due to logistics of compounding the TPN and having IV RN to hang.  RD request dextrose until TPN could start.  Start D5NS at 30 ml/hr after thiamine is given and stop once TPN starts this evening.  Did not do D10W given more significant carb load on a patient with refeeding risk.  Humna Moorehouse D. Laney Potash, PharmD, BCPS, BCCCP 03/18/2023, 9:27 AM

## 2023-03-18 NOTE — Progress Notes (Signed)
MD aware

## 2023-03-18 NOTE — Progress Notes (Signed)
Nutrition Brief Note  Visited patient at bedside who is planned for surgical j-tube placement today around 1530 per RN.   RD secure messaged RPH this morning on potentially starting TPN earlier today due to patient's urgent need for nutrition, but this is not possible due to hospital policies and procedures. So, patient was started on D5NS @230  ml/hr.   KCl also infusing at bedside.  RN reports she started him on thiamine   TF recs when s/p g or j tube  Jevity 1.5 @ 55 ml/hr (1320 ml) ProsourceTF 20 daily  Start at 10 ml/hr and advance by 10ml q12 hrs to goal rate  TF's at goal rate will provide 1980 kcal, 103 g protein, 1003 ml fluid   **patient is at very high risk for refeeding**   Continue thiamine for at least 5 days  Check mag, phos, and K+ 3x daily. MD to replete as necessary      Leodis Rains, RDN, LDN  Clinical Nutrition

## 2023-03-19 ENCOUNTER — Other Ambulatory Visit: Payer: Self-pay

## 2023-03-19 ENCOUNTER — Inpatient Hospital Stay (HOSPITAL_COMMUNITY): Payer: Medicare HMO

## 2023-03-19 ENCOUNTER — Encounter (HOSPITAL_COMMUNITY): Payer: Self-pay | Admitting: Vascular Surgery

## 2023-03-19 ENCOUNTER — Encounter (HOSPITAL_COMMUNITY)
Admission: RE | Disposition: A | Discharge status: Home Health | Payer: Self-pay | Source: Home / Self Care | Attending: Vascular Surgery

## 2023-03-19 DIAGNOSIS — E46 Unspecified protein-calorie malnutrition: Secondary | ICD-10-CM

## 2023-03-19 DIAGNOSIS — J449 Chronic obstructive pulmonary disease, unspecified: Secondary | ICD-10-CM

## 2023-03-19 DIAGNOSIS — R1310 Dysphagia, unspecified: Secondary | ICD-10-CM

## 2023-03-19 DIAGNOSIS — Z87891 Personal history of nicotine dependence: Secondary | ICD-10-CM

## 2023-03-19 HISTORY — PX: PEG PLACEMENT: SHX5437

## 2023-03-19 LAB — BASIC METABOLIC PANEL
Anion gap: 8 (ref 5–15)
BUN: 15 mg/dL (ref 8–23)
CO2: 26 mmol/L (ref 22–32)
Calcium: 8.2 mg/dL — ABNORMAL LOW (ref 8.9–10.3)
Chloride: 101 mmol/L (ref 98–111)
Creatinine, Ser: 0.74 mg/dL (ref 0.61–1.24)
GFR, Estimated: >60 mL/min (ref >60)
Glucose, Bld: 151 mg/dL — ABNORMAL HIGH (ref 70–99)
Potassium: 3.5 mmol/L (ref 3.5–5.1)
Sodium: 135 mmol/L (ref 135–145)

## 2023-03-19 LAB — GLUCOSE, CAPILLARY
Glucose-Capillary: 119 mg/dL — ABNORMAL HIGH (ref 70–99)
Glucose-Capillary: 142 mg/dL — ABNORMAL HIGH (ref 70–99)
Glucose-Capillary: 142 mg/dL — ABNORMAL HIGH (ref 70–99)
Glucose-Capillary: 170 mg/dL — ABNORMAL HIGH (ref 70–99)
Glucose-Capillary: 199 mg/dL — ABNORMAL HIGH (ref 70–99)
Glucose-Capillary: 237 mg/dL — ABNORMAL HIGH (ref 70–99)
Glucose-Capillary: 262 mg/dL — ABNORMAL HIGH (ref 70–99)

## 2023-03-19 LAB — CBC
HCT: 24.9 % — ABNORMAL LOW (ref 39.0–52.0)
Hemoglobin: 8.2 g/dL — ABNORMAL LOW (ref 13.0–17.0)
MCH: 32.2 pg (ref 26.0–34.0)
MCHC: 32.9 g/dL (ref 30.0–36.0)
MCV: 97.6 fL (ref 80.0–100.0)
Platelets: 252 10*3/uL (ref 150–400)
RBC: 2.55 MIL/uL — ABNORMAL LOW (ref 4.22–5.81)
RDW: 13.2 % (ref 11.5–15.5)
WBC: 9.5 10*3/uL (ref 4.0–10.5)
nRBC: 0 % (ref 0.0–0.2)

## 2023-03-19 LAB — PHOSPHORUS
Phosphorus: 2.2 mg/dL — ABNORMAL LOW (ref 2.5–4.6)
Phosphorus: 4.4 mg/dL (ref 2.5–4.6)

## 2023-03-19 LAB — MAGNESIUM
Magnesium: 1.9 mg/dL (ref 1.7–2.4)
Magnesium: 2.1 mg/dL (ref 1.7–2.4)

## 2023-03-19 SURGERY — INSERTION, PEG TUBE
Anesthesia: General

## 2023-03-19 MED ORDER — MAGNESIUM SULFATE 2 GM/50ML IV SOLN
2.0000 g | Freq: Once | INTRAVENOUS | Status: AC
  Administered 2023-03-19: 2 g via INTRAVENOUS
  Filled 2023-03-19: qty 50

## 2023-03-19 MED ORDER — STERILE WATER FOR IRRIGATION IR SOLN
Status: DC | PRN
  Administered 2023-03-19: 1000 mL

## 2023-03-19 MED ORDER — ONDANSETRON HCL 4 MG/2ML IJ SOLN
INTRAMUSCULAR | Status: AC
  Filled 2023-03-19: qty 2

## 2023-03-19 MED ORDER — PROPOFOL 10 MG/ML IV BOLUS
INTRAVENOUS | Status: AC
  Filled 2023-03-19: qty 20

## 2023-03-19 MED ORDER — PHENYLEPHRINE 80 MCG/ML (10ML) SYRINGE FOR IV PUSH (FOR BLOOD PRESSURE SUPPORT)
PREFILLED_SYRINGE | INTRAVENOUS | Status: AC
  Filled 2023-03-19: qty 10

## 2023-03-19 MED ORDER — DEXAMETHASONE SODIUM PHOSPHATE 10 MG/ML IJ SOLN
INTRAMUSCULAR | Status: AC
  Filled 2023-03-19: qty 1

## 2023-03-19 MED ORDER — CEFAZOLIN SODIUM-DEXTROSE 2-3 GM-%(50ML) IV SOLR
INTRAVENOUS | Status: DC | PRN
  Administered 2023-03-19: 2 g via INTRAVENOUS

## 2023-03-19 MED ORDER — JEVITY 1.5 CAL/FIBER PO LIQD
1000.0000 mL | ORAL | Status: DC
  Administered 2023-03-19 – 2023-03-24 (×7): 1000 mL
  Filled 2023-03-19 (×7): qty 1000

## 2023-03-19 MED ORDER — FENTANYL CITRATE (PF) 250 MCG/5ML IJ SOLN
INTRAMUSCULAR | Status: AC
  Filled 2023-03-19: qty 5

## 2023-03-19 MED ORDER — ESMOLOL HCL 100 MG/10ML IV SOLN
INTRAVENOUS | Status: DC | PRN
  Administered 2023-03-19: 20 mg via INTRAVENOUS

## 2023-03-19 MED ORDER — CHLORHEXIDINE GLUCONATE 0.12 % MT SOLN: 15.0000 mL | Freq: Once | OROMUCOSAL | Status: AC

## 2023-03-19 MED ORDER — TRAVASOL 10 % IV SOLN
INTRAVENOUS | Status: AC
  Filled 2023-03-19: qty 529.2

## 2023-03-19 MED ORDER — SUGAMMADEX SODIUM 200 MG/2ML IV SOLN
INTRAVENOUS | Status: DC | PRN
  Administered 2023-03-19: 200 mg via INTRAVENOUS

## 2023-03-19 MED ORDER — GUAIFENESIN-DM 100-10 MG/5ML PO SYRP
15.0000 mL | ORAL_SOLUTION | ORAL | Status: DC | PRN
  Administered 2023-03-20: 15 mL
  Filled 2023-03-19: qty 15

## 2023-03-19 MED ORDER — OXYCODONE HCL 5 MG/5ML PO SOLN: 5.0000 mg | Freq: Once | ORAL | Status: DC | PRN

## 2023-03-19 MED ORDER — PRAVASTATIN SODIUM 40 MG PO TABS
40.0000 mg | ORAL_TABLET | Freq: Every day | ORAL | Status: DC
  Administered 2023-03-20 – 2023-03-24 (×5): 40 mg
  Filled 2023-03-19 (×5): qty 1

## 2023-03-19 MED ORDER — ACETAMINOPHEN 325 MG PO TABS
325.0000 mg | ORAL_TABLET | ORAL | Status: DC | PRN
  Administered 2023-03-23 – 2023-03-24 (×2): 650 mg
  Filled 2023-03-19 (×2): qty 2

## 2023-03-19 MED ORDER — POTASSIUM PHOSPHATES 15 MMOLE/5ML IV SOLN
30.0000 mmol | Freq: Once | INTRAVENOUS | Status: AC
  Administered 2023-03-19: 30 mmol via INTRAVENOUS
  Filled 2023-03-19: qty 10

## 2023-03-19 MED ORDER — PRIMIDONE 50 MG PO TABS
50.0000 mg | ORAL_TABLET | Freq: Every day | ORAL | Status: DC
  Administered 2023-03-19 – 2023-03-23 (×5): 50 mg
  Filled 2023-03-19 (×5): qty 1

## 2023-03-19 MED ORDER — FENTANYL CITRATE (PF) 100 MCG/2ML IJ SOLN: 25.0000 ug | INTRAMUSCULAR | Status: DC | PRN

## 2023-03-19 MED ORDER — SENNOSIDES-DOCUSATE SODIUM 8.6-50 MG PO TABS: 1.0000 | ORAL_TABLET | Freq: Every evening | ORAL | Status: DC | PRN

## 2023-03-19 MED ORDER — IOHEXOL 350 MG/ML SOLN
150.0000 mL | Freq: Once | INTRAVENOUS | Status: AC | PRN
  Administered 2023-03-19: 150 mL via INTRAVENOUS

## 2023-03-19 MED ORDER — ONDANSETRON HCL 4 MG/2ML IJ SOLN: 4.0000 mg | Freq: Once | INTRAMUSCULAR | Status: DC | PRN

## 2023-03-19 MED ORDER — LACTATED RINGERS IV SOLN: INTRAVENOUS | Status: DC

## 2023-03-19 MED ORDER — SUCCINYLCHOLINE CHLORIDE 200 MG/10ML IV SOSY
PREFILLED_SYRINGE | INTRAVENOUS | Status: AC
  Filled 2023-03-19: qty 10

## 2023-03-19 MED ORDER — 0.9 % SODIUM CHLORIDE (POUR BTL) OPTIME
TOPICAL | Status: DC | PRN
  Administered 2023-03-19: 1000 mL

## 2023-03-19 MED ORDER — MEPERIDINE HCL 25 MG/ML IJ SOLN: 6.2500 mg | INTRAMUSCULAR | Status: DC | PRN

## 2023-03-19 MED ORDER — ACETAMINOPHEN 160 MG/5ML PO SOLN: 325.0000 mg | ORAL | Status: DC | PRN

## 2023-03-19 MED ORDER — ROCURONIUM BROMIDE 10 MG/ML (PF) SYRINGE
PREFILLED_SYRINGE | INTRAVENOUS | Status: DC | PRN
  Administered 2023-03-19: 30 mg via INTRAVENOUS

## 2023-03-19 MED ORDER — DEXAMETHASONE SODIUM PHOSPHATE 10 MG/ML IJ SOLN
INTRAMUSCULAR | Status: DC | PRN
  Administered 2023-03-19: 10 mg via INTRAVENOUS

## 2023-03-19 MED ORDER — DOCUSATE SODIUM 50 MG/5ML PO LIQD
100.0000 mg | Freq: Every day | ORAL | Status: DC
  Administered 2023-03-20 – 2023-03-22 (×3): 100 mg
  Filled 2023-03-19 (×6): qty 10

## 2023-03-19 MED ORDER — BUPIVACAINE LIPOSOME 1.3 % IJ SUSP
INTRAMUSCULAR | Status: AC
  Filled 2023-03-19: qty 20

## 2023-03-19 MED ORDER — EPHEDRINE 5 MG/ML INJ
INTRAVENOUS | Status: AC
  Filled 2023-03-19: qty 5

## 2023-03-19 MED ORDER — ACETAMINOPHEN 325 MG PO TABS: 325.0000 mg | ORAL_TABLET | ORAL | Status: DC | PRN

## 2023-03-19 MED ORDER — TRAMADOL HCL 50 MG PO TABS
50.0000 mg | ORAL_TABLET | Freq: Four times a day (QID) | ORAL | Status: DC | PRN
  Administered 2023-03-19 – 2023-03-24 (×7): 50 mg
  Filled 2023-03-19 (×7): qty 1

## 2023-03-19 MED ORDER — OXYCODONE HCL 5 MG PO TABS: 5.0000 mg | ORAL_TABLET | Freq: Once | ORAL | Status: DC | PRN

## 2023-03-19 MED ORDER — POTASSIUM CHLORIDE 10 MEQ/50ML IV SOLN
10.0000 meq | INTRAVENOUS | Status: AC
  Administered 2023-03-19 (×2): 10 meq via INTRAVENOUS
  Filled 2023-03-19 (×2): qty 50

## 2023-03-19 MED ORDER — ALUM & MAG HYDROXIDE-SIMETH 200-200-20 MG/5ML PO SUSP: 15.0000 mL | ORAL | Status: DC | PRN

## 2023-03-19 MED ORDER — VITAL HIGH PROTEIN PO LIQD
1000.0000 mL | ORAL | Status: DC
  Filled 2023-03-19: qty 1000

## 2023-03-19 MED ORDER — ALLOPURINOL 100 MG PO TABS: 100.0000 mg | ORAL_TABLET | Freq: Every day | ORAL | Status: DC

## 2023-03-19 MED ORDER — FENTANYL CITRATE (PF) 250 MCG/5ML IJ SOLN
INTRAMUSCULAR | Status: DC | PRN
  Administered 2023-03-19 (×2): 50 ug via INTRAVENOUS

## 2023-03-19 MED ORDER — PROPOFOL 10 MG/ML IV BOLUS
INTRAVENOUS | Status: DC | PRN
  Administered 2023-03-19: 80 mg via INTRAVENOUS

## 2023-03-19 MED ORDER — INSULIN ASPART 100 UNIT/ML IJ SOLN
0.0000 [IU] | INTRAMUSCULAR | Status: DC
  Administered 2023-03-19: 2 [IU] via SUBCUTANEOUS
  Administered 2023-03-19: 3 [IU] via SUBCUTANEOUS
  Administered 2023-03-19: 5 [IU] via SUBCUTANEOUS
  Administered 2023-03-20: 1 [IU] via SUBCUTANEOUS
  Administered 2023-03-20: 2 [IU] via SUBCUTANEOUS
  Administered 2023-03-20 (×2): 1 [IU] via SUBCUTANEOUS
  Administered 2023-03-20: 3 [IU] via SUBCUTANEOUS
  Administered 2023-03-21 – 2023-03-24 (×7): 1 [IU] via SUBCUTANEOUS

## 2023-03-19 MED ORDER — ALLOPURINOL 100 MG PO TABS
100.0000 mg | ORAL_TABLET | Freq: Every day | ORAL | Status: DC
  Administered 2023-03-19 – 2023-03-24 (×6): 100 mg
  Filled 2023-03-19 (×6): qty 1

## 2023-03-19 MED ORDER — THIAMINE MONONITRATE 100 MG PO TABS
100.0000 mg | ORAL_TABLET | Freq: Every day | ORAL | Status: AC
  Administered 2023-03-20 – 2023-03-23 (×4): 100 mg
  Filled 2023-03-19 (×4): qty 1

## 2023-03-19 MED ORDER — LIDOCAINE 2% (20 MG/ML) 5 ML SYRINGE
INTRAMUSCULAR | Status: AC
  Filled 2023-03-19: qty 5

## 2023-03-19 MED ORDER — PROSOURCE TF20 ENFIT COMPATIBL EN LIQD
60.0000 mL | Freq: Every day | ENTERAL | Status: DC
  Administered 2023-03-19 – 2023-03-24 (×6): 60 mL
  Filled 2023-03-19 (×6): qty 60

## 2023-03-19 MED ORDER — ACETAMINOPHEN 650 MG RE SUPP: 325.0000 mg | RECTAL | Status: DC | PRN

## 2023-03-19 MED ORDER — LIDOCAINE 1 % OPTIME INJ - NO CHARGE
INTRAMUSCULAR | Status: DC | PRN
  Administered 2023-03-19: 5 mL

## 2023-03-19 MED ORDER — BUPIVACAINE HCL (PF) 0.25 % IJ SOLN
INTRAMUSCULAR | Status: AC
  Filled 2023-03-19: qty 30

## 2023-03-19 MED ORDER — ORAL CARE MOUTH RINSE: 15.0000 mL | Freq: Once | OROMUCOSAL | Status: AC

## 2023-03-19 MED ORDER — FAMOTIDINE 20 MG PO TABS
40.0000 mg | ORAL_TABLET | Freq: Every day | ORAL | Status: DC
  Administered 2023-03-20 – 2023-03-24 (×5): 40 mg
  Filled 2023-03-19 (×5): qty 2

## 2023-03-19 MED ORDER — IRBESARTAN 300 MG PO TABS: 300.0000 mg | ORAL_TABLET | Freq: Every day | ORAL | Status: DC | PRN

## 2023-03-19 MED ORDER — PHENYLEPHRINE HCL-NACL 20-0.9 MG/250ML-% IV SOLN
INTRAVENOUS | Status: DC | PRN
  Administered 2023-03-19: 40 ug/min via INTRAVENOUS

## 2023-03-19 MED ORDER — CHLORHEXIDINE GLUCONATE 0.12 % MT SOLN
OROMUCOSAL | Status: AC
  Administered 2023-03-19: 15 mL via OROMUCOSAL
  Filled 2023-03-19: qty 15

## 2023-03-19 MED ORDER — CELECOXIB 200 MG PO CAPS
200.0000 mg | ORAL_CAPSULE | Freq: Every day | ORAL | Status: DC
  Administered 2023-03-19 – 2023-03-24 (×6): 200 mg
  Filled 2023-03-19 (×6): qty 1

## 2023-03-19 SURGICAL SUPPLY — 41 items
ADH SKN CLS LQ APL DERMABOND (GAUZE/BANDAGES/DRESSINGS) ×1
APL PRP STRL LF DISP 70% ISPRP (MISCELLANEOUS) ×1
BAG COUNTER SPONGE SURGICOUNT (BAG) ×2 IMPLANT
BAG SPNG CNTER NS LX DISP (BAG) ×1
BINDER ABDOMINAL 12 ML 46-62 (SOFTGOODS) IMPLANT
BLADE CLIPPER SURG (BLADE) IMPLANT
BLOCK BITE 60FR ADLT L/F BLUE (MISCELLANEOUS) ×2 IMPLANT
BUTTON OLYMPUS DEFENDO 5 PIECE (MISCELLANEOUS) ×2 IMPLANT
CANISTER SUCT 3000ML PPV (MISCELLANEOUS) ×2 IMPLANT
CHLORAPREP W/TINT 26 (MISCELLANEOUS) ×2 IMPLANT
COVER SURGICAL LIGHT HANDLE (MISCELLANEOUS) ×2 IMPLANT
DERMABOND ADVANCED .7 DNX6 (GAUZE/BANDAGES/DRESSINGS) ×2 IMPLANT
ELECT REM PT RETURN 9FT ADLT (ELECTROSURGICAL) ×1
ELECTRODE REM PT RTRN 9FT ADLT (ELECTROSURGICAL) ×2 IMPLANT
GLOVE BIO SURGEON STRL SZ 6.5 (GLOVE) ×2 IMPLANT
GLOVE BIOGEL PI IND STRL 6 (GLOVE) ×2 IMPLANT
GOWN STRL REUS W/ TWL LRG LVL3 (GOWN DISPOSABLE) ×4 IMPLANT
GOWN STRL REUS W/TWL LRG LVL3 (GOWN DISPOSABLE) ×2
IRRIG SUCT STRYKERFLOW 2 WTIP (MISCELLANEOUS)
IRRIGATION SUCT STRKRFLW 2 WTP (MISCELLANEOUS) IMPLANT
KIT BASIN OR (CUSTOM PROCEDURE TRAY) ×2 IMPLANT
KIT CLEAN ENDO COMPLIANCE (KITS) ×2 IMPLANT
KIT PEG 24FR PULL ENFIT (KITS) ×1
KIT PG 24FR PUL EVV ENFIT (KITS) ×2 IMPLANT
KIT TURNOVER KIT B (KITS) ×2 IMPLANT
NS IRRIG 1000ML POUR BTL (IV SOLUTION) ×2 IMPLANT
PAD ARMBOARD 7.5X6 YLW CONV (MISCELLANEOUS) ×4 IMPLANT
SCISSORS LAP 5X35 DISP (ENDOMECHANICALS) IMPLANT
SET TUBE SMOKE EVAC HIGH FLOW (TUBING) ×2 IMPLANT
SLEEVE Z-THREAD 5X100MM (TROCAR) ×2 IMPLANT
SUT MNCRL AB 4-0 PS2 18 (SUTURE) ×2 IMPLANT
TOWEL GREEN STERILE (TOWEL DISPOSABLE) ×2 IMPLANT
TOWEL GREEN STERILE FF (TOWEL DISPOSABLE) ×2 IMPLANT
TRAY LAPAROSCOPIC MC (CUSTOM PROCEDURE TRAY) ×2 IMPLANT
TROCAR BALLN 12MMX100 BLUNT (TROCAR) IMPLANT
TROCAR Z-THREAD OPTICAL 5X100M (TROCAR) ×2 IMPLANT
TUBE CONNECTING 12X1/4 (SUCTIONS) ×2 IMPLANT
TUBE ENDOVIVE SAFETY PEG 24 (TUBING) IMPLANT
TUBING ENDO SMARTCAP (MISCELLANEOUS) ×2 IMPLANT
WARMER LAPAROSCOPE (MISCELLANEOUS) ×2 IMPLANT
WATER STERILE IRR 1000ML POUR (IV SOLUTION) ×2 IMPLANT

## 2023-03-19 NOTE — Progress Notes (Addendum)
Rapid Response Note  Called by staff for concern of stroke as family states new onset of slurred speech. LKW 0600. No new focal deficits on exam, NIH 4. Right sided deficits present since post procedure this admission. Patient remains NPO, contributing to dysarthria. Findings discussed with MD Bhagat. Code Stroke not activated as patient not a candidate for intervention. MD Randie Heinz updated. Family at bedside, discussed with patient/family.   148/76 (95) HR 80 RR 16 O2 97% RA CBG 119  Brian Dunn  Rapid Response Nurse

## 2023-03-19 NOTE — Progress Notes (Signed)
Nutrition Follow-up  DOCUMENTATION CODES:   Severe malnutrition in context of chronic illness  INTERVENTION:  - Continue advancing TPN to goal rate as tolerated - RD recommends continuing to replace electrolytes as necessary  - Continue thiamine supplementation for risk of refeeding   TF recs when s/p PEG or surgical G-tube  Jevity 1.5 @ 55 ml/hr (1320 ml) - ProsourceTF 20 daily  - Start at 10 ml/hr and advance by 10ml q12 hrs to goal rate  TF's at goal rate will provide 1980 kcal, 103 g protein, 1003 ml fluid    NUTRITION DIAGNOSIS:   Severe Malnutrition related to chronic illness (COPD, also suspect concerns over difficulty swallowing impacting intake) as evidenced by severe fat depletion, severe muscle depletion.   GOAL:   Patient will meet greater than or equal to 90% of their needs -ongoing   MONITOR:   PO intake, Supplement acceptance, Diet advancement, Labs, Weight trends, I & O's  REASON FOR ASSESSMENT:   Malnutrition Screening Tool    ASSESSMENT:   82 year old male with PMHx of COPD, GERD, HTN, CAD now who presented for surgery with diagnosis of Kommerell's diverticulum s/p transposition of right SCA to right CCA with ligation of Kommerell Diverticulum, thoracic aortic endovascular stent graft, stent of left CIA and EIA on 03/11/23.  Labs: K+ 3.5, Glu 151, phos 2.2 Meds: colace, pepcid, insulin, magnesium sulfate, protonix, KCl, Kphos, NS, thiamine, ancef Admit wt- 131.9#; CBW- 124.1# -  7.8# (6%) wt loss x 8 days  I/O's:  +5.8 L  Patient tolerating TPN at 30 ml/hr. Electrolytes are being replaced per protocol. Plans for PEG vs surgical g tube today if patient is stable enough. Rapid response called due to reported slurred speech. Code stroke was not called.    Diet Order:   Diet Order             Diet NPO time specified Except for: Ice Chips  Diet effective now                   EDUCATION NEEDS:   Education needs have been addressed  Skin:   Skin Assessment: Skin Integrity Issues: Skin Integrity Issues:: Incisions Incisions: closed incisions to right chest and groin  Last BM:  5/10  Height:   Ht Readings from Last 1 Encounters:  03/11/23 5\' 9"  (1.753 m)    Weight:   Wt Readings from Last 1 Encounters:  03/18/23 56.3 kg    Ideal Body Weight:  72.7 kg  BMI:  Body mass index is 18.33 kg/m.  Estimated Nutritional Needs:   Kcal:  1850-2100 kcal  Protein:  85-95 grams  Fluid:  >1.8 L   Leodis Rains, RDN, LDN  Clinical Nutrition

## 2023-03-19 NOTE — Progress Notes (Signed)
RN at bedside - family state pt has new onset of slurred speech and concerned about neck pain that is radiating down right arm and slightly down left.  PA paged and aware - MD/PA to round on pt.

## 2023-03-19 NOTE — Progress Notes (Addendum)
  Progress Note    03/19/2023 8:14 AM 8 Days Post-Op  Subjective:  ongoing neck pain   Vitals:   03/19/23 0719 03/19/23 0747  BP: (!) 149/65 (!) 148/76  Pulse: 80 80  Resp: 18   Temp: 98.1 F (36.7 C) 99.2 F (37.3 C)  SpO2: 96% 97%   Physical Exam: Lungs:  non labored Incisions:  R neck healing well; B groin incisions healing Neurologic: moving all ext well; symmetrical grip strength  CBC    Component Value Date/Time   WBC 9.5 03/19/2023 0425   RBC 2.55 (L) 03/19/2023 0425   HGB 8.2 (L) 03/19/2023 0425   HGB 11.1 (L) 01/31/2022 0920   HCT 24.9 (L) 03/19/2023 0425   HCT 32.7 (L) 01/31/2022 0920   PLT 252 03/19/2023 0425   PLT 262 01/31/2022 0920   MCV 97.6 03/19/2023 0425   MCV 96 01/31/2022 0920   MCV 95 (A) 02/27/2021 0000   MCH 32.2 03/19/2023 0425   MCHC 32.9 03/19/2023 0425   RDW 13.2 03/19/2023 0425   RDW 11.8 01/31/2022 0920   LYMPHSABS 1.5 01/12/2020 1005   MONOABS 0.3 01/22/2007 0923   EOSABS 0.2 01/12/2020 1005   BASOSABS 0.0 01/12/2020 1005    BMET    Component Value Date/Time   NA 135 03/19/2023 0425   NA 136 02/07/2023 1007   K 3.5 03/19/2023 0425   CL 101 03/19/2023 0425   CO2 26 03/19/2023 0425   GLUCOSE 151 (H) 03/19/2023 0425   BUN 15 03/19/2023 0425   BUN 27 02/07/2023 1007   CREATININE 0.74 03/19/2023 0425   CREATININE 1.26 (H) 12/20/2022 0949   CALCIUM 8.2 (L) 03/19/2023 0425   GFRNONAA >60 03/19/2023 0425   GFRNONAA 57 (L) 12/20/2022 0949   GFRAA 76 11/07/2020 1442    INR    Component Value Date/Time   INR 1.2 03/11/2023 1742     Intake/Output Summary (Last 24 hours) at 03/19/2023 0814 Last data filed at 03/19/2023 0457 Gross per 24 hour  Intake 868.64 ml  Output 400 ml  Net 468.64 ml     Assessment/Plan:  82 y.o. male is s/p  Transposition of right SCA to right CCA with ligation of Kommerell Diverticulum, thoracic aortic endovascular stent graft, stent of left CIA and EIA  8 Days Post-Op   R neck incision  healing well.  Afebrile this morning and without a white count.  Plan is for G tube with Surgery today.  We will obtain CTA head, neck, chest post operatively.  Continue TPN today.   Emilie Rutter, PA-C [facility] [phone] 03/19/2023 8:14 AM  I have independently interviewed and examined patient and agree with PA assessment and plan above.  Patient with severe neck and shoulder pain ongoing for 1 day but appears neurologically intact.  Stroke team evaluated patient and elected not to proceed with code stroke.  TPN was started yesterday and he is scheduled for PEG versus surgical G-tube today.  Will reorder TPN.  Plan for CTA of the chest and neck after feeding tube insertion to evaluate for neck and shoulder pain and rule out any underlying surgical complication as the cause.  Patient without any significant leukocytosis and other labs remain within the normal limits with stable postoperative acute blood loss anemia.Apolinar Junes C. Randie Heinz, MD [facility] [place] Office: [phone] Pager: [phone]

## 2023-03-19 NOTE — Anesthesia Procedure Notes (Signed)
Procedure Name: Intubation Date/Time: 03/19/2023 10:40 AM  Performed by: Kayleen Memos, CRNAPre-anesthesia Checklist: Patient identified, Emergency Drugs available, Suction available, Patient being monitored and Timeout performed Patient Re-evaluated:Patient Re-evaluated prior to induction Oxygen Delivery Method: Circle system utilized Preoxygenation: Pre-oxygenation with 100% oxygen Induction Type: IV induction Ventilation: Mask ventilation without difficulty Laryngoscope Size: Mac and 4 Grade View: Grade I Tube type: Oral Tube size: 7.0 mm Number of attempts: 1 Airway Equipment and Method: Stylet Placement Confirmation: ETT inserted through vocal cords under direct vision Secured at: 24 cm Tube secured with: Tape Dental Injury: Teeth and Oropharynx as per pre-operative assessment

## 2023-03-19 NOTE — Anesthesia Preprocedure Evaluation (Addendum)
Anesthesia Evaluation  Patient identified by MRN, date of birth, ID band Patient awake    Reviewed: Allergy & Precautions, H&P , NPO status , Patient's Chart, lab work & pertinent test results  Airway Mallampati: II   Neck ROM: full    Dental  (+) Poor Dentition, Chipped, Missing, Dental Advisory Given   Pulmonary asthma , COPD, former smoker   breath sounds clear to auscultation       Cardiovascular hypertension, + angina  + CAD and + Peripheral Vascular Disease   Rhythm:regular Rate:Normal  Stress test (02/26/23): normal LVEF, no ischemia.  Cardiology clearance in EPIC   Neuro/Psych    GI/Hepatic ,GERD  ,,  Endo/Other    Renal/GU Renal disease     Musculoskeletal   Abdominal   Peds  Hematology  (+) Blood dyscrasia, anemia   Anesthesia Other Findings   Reproductive/Obstetrics                             Anesthesia Physical Anesthesia Plan  ASA: 4 and emergent  Anesthesia Plan: General   Post-op Pain Management:    Induction: Intravenous  PONV Risk Score and Plan: 2 and Ondansetron, Dexamethasone and Treatment may vary due to age or medical condition  Airway Management Planned: Oral ETT and LMA  Additional Equipment: None  Intra-op Plan:   Post-operative Plan: Extubation in OR  Informed Consent: I have reviewed the patients History and Physical, chart, labs and discussed the procedure including the risks, benefits and alternatives for the proposed anesthesia with the patient or authorized representative who has indicated his/her understanding and acceptance.     Dental advisory given  Plan Discussed with: CRNA, Anesthesiologist and Surgeon  Anesthesia Plan Comments: (PAT note by Antionette Poles, PA-C: Follows cardiology for history of HTN, HLD, CAD s/p DES x 2 to LCx.  Seen by Dr. Tomie China 02/26/2023 for preop evaluation.  Per note, "Preoperative restratification for  subclavian artery aneurysm resection: He had a stress test today and this did not reveal any evidence of ischemia and ejection fraction is preserved.  In view of this he is not at high risk for complications during the above-mentioned surgery.  Meticulous hemodynamic monitoring will further reduce the risk of coronary events."  Follows with pulmonology for history of cough variant asthma and severe dysphagia.  Former smoker, 35 pack years, quit 1998.  Last seen by Dr. Sherene Sires 12/27/2022.  Per note, "Experiencing rapid geriactric decline in setting of severe dysphagia/ wt loss so nothing to offer from pulmonary perspective if can't improve his nutritional status and refuses tube feeding / advised. Pulmonary f/u for worse sob/ cough / pleuritic cp prn "  Patient has had extensive workup for dysphagia.  Ultimately was evaluated by vascular surgeon Dr. 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  He recommended right subclavian artery transposition to the right common carotid artery and thoracic endovascular coverage of, diverticulum to relieve pressure on the esophagus.  Preop labs reviewed, mild hyponatremia sodium 134, mild anemia hemoglobin 10.9, otherwise unremarkable.  EKG 02/07/2023: NSR.  Rate 69.  Incomplete right bundle branch block.  Nuclear stress 02/26/2023:   The study is normal. The study is low risk.   Left ventricular function is normal. Nuclear stress EF: 65 %. The left ventricular ejection fraction is normal (55-65%). End diastolic cavity size is normal.  Cath and PCI 11/16/2020: ? Prox  Cx lesion is 50% stenosed. ? Mid Cx lesion is 99% stenosed. ? Post intervention, there is a 0% residual stenosis. ? A drug-eluting stent was successfully placed using a STENT RESOLUTE ONYX 3.0X34. ? Post intervention, there is a 0% residual stenosis. ? Prox LAD to Mid LAD lesion is 40% stenosed with 60% stenosed side branch  in 2nd Diag. ? Mid LAD lesion is 60% stenosed. ? Prox RCA lesion is 25% stenosed. ? There is hyperdynamic left ventricular systolic function. ? LV end diastolic pressure is normal. ? The left ventricular ejection fraction is greater than 65% by visual estimate. ? There is no aortic valve stenosis.    ? Severe single-vessel disease with 99% focal mid LCx lesion just downstream to previously stent along with ~50% ISR in the previously placed stent (CULPRIT LESION-PCI);   o Successful DES PCI of mid LCx 99% lesion and overlapping the previously placed stent with 50+ percent ISR. ? Moderate mid LAD-2nd Diag disease best treated medically ? Hyperdynamic LV with normal EF.  Normal EDP   )        Anesthesia Quick Evaluation

## 2023-03-19 NOTE — Progress Notes (Signed)
PHARMACY - TOTAL PARENTERAL NUTRITION CONSULT NOTE  Indication:  Esophageal stricture with failed attempts at placing enteral access.  Patient Measurements: Height: 5\' 9"  (175.3 cm) Weight: 56.3 kg (124 lb 1.6 oz) IBW/kg (Calculated) : 70.7 TPN AdjBW (KG): 59.8 Body mass index is 18.33 kg/m. Usual weight = 175 lbs, lost weight over many years  Assessment:  63 YOM presented on 5/7 for transposition of R SCA to R CCA with ligation of Kommerell diverticulum, thoracic aortic endovascular stent graft, and stenting of left CIA and EIA.  Patient was started on dysphagia diet on 5/8 and then downgraded to dysphagia 1 diet on 5/9.  Unable to place a cortrak, NG tube and G tube.  Patient has esophageal dysfunction/dysphagia with severe malnutrition per documentation. Pharmacy consulted to dose TPN.  Patient has inadequate nutrition for 8 days since admission and PO intake was declining in the outpatient setting.  Glucose / Insulin: no hx DM  - CBGs < 180 Used 2 units SSI since TPN initiation Electrolytes: refeeding - all WNL except Phos down to 2.2, (K low normal post 6 runs, Mag up to 1.9 post 3gm IV) Renal: SCr < 1, BUN WNL Hepatic: LFTs / TG WNL, tbili 1.4, albumin 2.4 Intake / Output; MIVF: UOP 0.3 ml/kg/hr GI Imaging: none since TPN initiation GI Surgeries / Procedures: none since TPN initiation  Central access: PICC placed 03/17/23 TPN start date: 03/18/23  Nutritional Goals: RD Estimated Needs Total Energy Estimated Needs: 1850-2100 kcal Total Protein Estimated Needs: 85-95 grams Total Fluid Estimated Needs: >1.8 L  Current Nutrition:  TPN  Plan:  Increase TPN slightly to 45 ml/hr at 1800 (goal rate 75 ml/hr) Electrolytes in TPN: Na 15mEq/L, K 70mEq/L, Mag 90mEq/L, Ca 28mEq/L, Phos 3mmol/L, Cl:Ac 1:1 Add daily multivitamin and trace elements to TPN (D/C PO mvi) Continue sensitive SSI Q6H KPhos IV x 1 KCL x 2 runs Mag sulfate 2gm IV x 1 Thiamine 100mg  IV daily x 6 days or  until TPN stable at goal rate Standard TPN labs Mon/Thurs - labs in AM F/U enteral access for TF  Starnisha Batrez D. Laney Potash, PharmD, BCPS, BCCCP 03/19/2023, 7:27 AM

## 2023-03-19 NOTE — Progress Notes (Signed)
RD placed order for TF - TF to start in 4 hours after PEG placement.

## 2023-03-19 NOTE — Anesthesia Postprocedure Evaluation (Signed)
Anesthesia Post Note  Patient: Brian Dunn  Procedure(s) Performed: PERCUTANEOUS ENDOSCOPIC GASTROSTOMY (PEG) PLACEMENT     Patient location during evaluation: PACU Anesthesia Type: General Level of consciousness: awake and alert Pain management: pain level controlled Vital Signs Assessment: post-procedure vital signs reviewed and stable Respiratory status: spontaneous breathing, nonlabored ventilation, respiratory function stable and patient connected to nasal cannula oxygen Cardiovascular status: blood pressure returned to baseline and stable Postop Assessment: no apparent nausea or vomiting Anesthetic complications: no   No notable events documented.  Last Vitals:  Vitals:   03/19/23 1230 03/19/23 1404  BP:    Pulse: 83 79  Resp:    Temp:    SpO2: 96% 95%    Last Pain:  Vitals:   03/19/23 1245  TempSrc:   PainSc: 6                  Analysse Quinonez

## 2023-03-19 NOTE — Transfer of Care (Signed)
Immediate Anesthesia Transfer of Care Note  Patient: Brian Dunn  Procedure(s) Performed: PERCUTANEOUS ENDOSCOPIC GASTROSTOMY (PEG) PLACEMENT  Patient Location: PACU  Anesthesia Type:General  Level of Consciousness: awake, alert , and oriented  Airway & Oxygen Therapy: Patient Spontanous Breathing and Patient connected to nasal cannula oxygen  Post-op Assessment: Report given to RN, Post -op Vital signs reviewed and stable, and Patient moving all extremities  Post vital signs: stable  Last Vitals:  Vitals Value Taken Time  BP 134/67 03/19/23 1107  Temp 37.2 C 03/19/23 1107  Pulse 84 03/19/23 1111  Resp 25 03/19/23 1111  SpO2 97 % 03/19/23 1111  Vitals shown include unvalidated device data.  Last Pain:  Vitals:   03/19/23 0914  TempSrc: Axillary  PainSc: 8       Patients Stated Pain Goal: 3 (03/19/23 0037)  Complications: No notable events documented.

## 2023-03-19 NOTE — Op Note (Signed)
   Procedure Note  Date: 03/19/2023  Procedure: esophagogastroduodenoscopy (EGD) and percutaneous endoscopic gastrostomy (PEG) tube placement  Pre-op diagnosis: dysphagia, malnutrition Post-op diagnosis: same  Indication and clinical history: 71M with dysphagia  Surgeon: Diamantina Monks, MD  Anesthesia: general  Findings: Specimen: none EBL: <5cc Drains/Implants: PEG tube, 2cm at the skin   Disposition: ICU/PACU  Description of Procedure: The patient was positioned supine. Time-out was performed verifying correct patient, procedure, signature of informed consent, and pre-operative antibiotics as indicated. General anesthetic induction was uneventful and a bite block was placed into the oropharynx. The endoscope was inserted into the oropharynx and advanced down the esophagus into the stomach and into the duodenum. The visualized esophagus and duodenum were unremarkable. The endoscope was retracted back into the stomach and the stomach was insufflated. The stomach was inspected and was also normal. Transillumination was performed. The light was visible on the external skin and dimpling of the stomach was noted endoscopically with manual pressure. The abdomen was prepped and draped in the usual sterile fashion. Transillumination and dimpling were repeated and local anesthetic was infiltrated to make a skin wheal at the site of transillumination. The needle was inserted perpendicularly to the skin and the tip of the needle was visualized endoscopically. As the needle was retracted, the tract was also anesthetized. A skin nick was made at the site of the wheal and an introducer needle and sheath were inserted. The needle was removed and guidewire inserted. The guidewire was grasped by an endoscopic snare and the snare, guidewire, and endoscope retracted out of the oropharynx. The PEG tube was secured to the guidewire and retracted through the mouth and esophagus into the stomach. The PEG tube was  secured with a bolster and was visualized endoscopically to spin freely circumferentially and also be without gaps between the internal bumper and the stomach wall. There was no evidence of bleeding. The PEG bolster was secured at 2cm at the skin and there were no gaps between the bolster and the abdominal wall. The stomach was desufflated endoscopically and the endoscope removed. The bite block was also removed. The patient tolerated the procedure well and there were no complications.   The patient may have water and medications administered via the PEG tube beginning immediately and tube feeds may be initiated four hours post-procedure.    Diamantina Monks, MD General and Trauma Surgery [facility] Surgery

## 2023-03-19 NOTE — Progress Notes (Signed)
SLP Cancellation Note  Patient Details Name: Que Laduca MRN: [id number] DOB: Apr 19, 1941   Cancelled treatment:       Reason Eval/Treat Not Completed: Patient at procedure or test/unavailable. Will f/u tomorrow   Claudine Mouton 03/19/2023, 11:20 AM

## 2023-03-20 ENCOUNTER — Encounter (HOSPITAL_COMMUNITY): Payer: Self-pay | Admitting: Surgery

## 2023-03-20 LAB — COMPREHENSIVE METABOLIC PANEL
ALT: 19 U/L (ref 0–44)
AST: 30 U/L (ref 15–41)
Albumin: 2.1 g/dL — ABNORMAL LOW (ref 3.5–5.0)
Alkaline Phosphatase: 90 U/L (ref 38–126)
Anion gap: 8 (ref 5–15)
BUN: 28 mg/dL — ABNORMAL HIGH (ref 8–23)
CO2: 26 mmol/L (ref 22–32)
Calcium: 8.3 mg/dL — ABNORMAL LOW (ref 8.9–10.3)
Chloride: 102 mmol/L (ref 98–111)
Creatinine, Ser: 0.72 mg/dL (ref 0.61–1.24)
GFR, Estimated: >60 mL/min (ref >60)
Glucose, Bld: 216 mg/dL — ABNORMAL HIGH (ref 70–99)
Potassium: 4.4 mmol/L (ref 3.5–5.1)
Sodium: 136 mmol/L (ref 135–145)
Total Bilirubin: 0.6 mg/dL (ref 0.3–1.2)
Total Protein: 5.5 g/dL — ABNORMAL LOW (ref 6.5–8.1)

## 2023-03-20 LAB — GLUCOSE, CAPILLARY
Glucose-Capillary: 120 mg/dL — ABNORMAL HIGH (ref 70–99)
Glucose-Capillary: 129 mg/dL — ABNORMAL HIGH (ref 70–99)
Glucose-Capillary: 139 mg/dL — ABNORMAL HIGH (ref 70–99)
Glucose-Capillary: 147 mg/dL — ABNORMAL HIGH (ref 70–99)
Glucose-Capillary: 169 mg/dL — ABNORMAL HIGH (ref 70–99)
Glucose-Capillary: 221 mg/dL — ABNORMAL HIGH (ref 70–99)

## 2023-03-20 LAB — PHOSPHORUS: Phosphorus: 3.6 mg/dL (ref 2.5–4.6)

## 2023-03-20 LAB — MAGNESIUM: Magnesium: 2.3 mg/dL (ref 1.7–2.4)

## 2023-03-20 MED ORDER — INSULIN ASPART 100 UNIT/ML IJ SOLN
2.0000 [IU] | INTRAMUSCULAR | Status: DC
  Administered 2023-03-20 – 2023-03-24 (×17): 2 [IU] via SUBCUTANEOUS

## 2023-03-20 MED ORDER — TRAVASOL 10 % IV SOLN
INTRAVENOUS | Status: AC
  Filled 2023-03-20: qty 529.2

## 2023-03-20 MED ORDER — ASPIRIN 325 MG PO TABS
325.0000 mg | ORAL_TABLET | Freq: Every day | ORAL | Status: DC
  Administered 2023-03-20 – 2023-03-24 (×5): 325 mg
  Filled 2023-03-20 (×5): qty 1

## 2023-03-20 NOTE — Progress Notes (Signed)
PEG placement 5/15 - tube in good position, 2cm at the skin. Abdominal binder in place at all times. If tube is accidentally removed before 5/22, please call the surgeon on call immediately as this is a surgical emergency. Okay to use tube for meds, water flushes, and tube feeds without restriction from surgical perspective, however defer decision-making to primary team. Recommend abdominal binder to limit opportunity for accidental removal. Please call for any additional questions/concerns.   Diamantina Monks, MD General and Trauma Surgery [facility] Surgery

## 2023-03-20 NOTE — Progress Notes (Signed)
Speech Language Pathology Treatment: Dysphagia  Patient Details Name: Brian Dunn MRN: [id number] DOB: 1941-11-02 Today's Date: 03/20/2023 Time: 7829-5621 SLP Time Calculation (min) (ACUTE ONLY): 36 min  Assessment / Plan / Recommendation Clinical Impression  Brian Dunn s/p G-tube yesterday. He was assisted with set-up to brush his teeth; encouraged him to complete oral care four times/day.  He completed Masako maneuver (tongue anchor/swallow) and effortful swallow exercises while intermittently eating ice, swallowing tspns of water. His clinical presentation remains consistent with prior sessions, notable for coughing after most swallows and without improvement at this time. Encouraged him to continue sips/chips and work on exercises throughout the day.  Spoke with Dr. Randie Dunn after session, who confirms pt will need ample time for recovery; even so, there remains the pharyngeal component to his dysphagia whose etiology is unknown.  Continue plan for OP SLP, f/u appt with ENT at [facility] in June, repeat MBS as OP in 4-6 weeks.   Will follow while here.   HPI HPI: Pt is an 82 y/o male who presented for surgery, with the diagnosis of Kommerell's diverticulum. Pt s/p transposition of right SCA to right CCA with ligation of Kommerell Diverticulum, thoracic aortic endovascular stent graft, stent of left CIA and EIA 5/7.  G-tube 5/15. PMH: vascular disease and CAD. MBS 01/13/23: lordosis of his cervical spine, thinning of the prevertebral space, and what appears to be hypertrophy in the mid-superior pharynx (presents similarly to CP bar but located anatomically higher). Poor base-of-tongue to pharyngeal wall contact and the epiglottis is unable to invert past its horizontal position. There is good laryngeal elevation; the arytenoids never fully meet the epiglottic petiole, minimal distension of the PES. Factors led to significant residue that accumulates in the pharynx, regardess of  consistency, and is aspirated either during or after the swallow response. A regular texture diet with thin liquids was recommended with observance of swallowing precautions considering the severity of symptoms across consistencies. ENT referral at [facility] and outpatient SLP services recommended. Per Dr. Randie Dunn,  CT scan appeared to show the diverticulum to be compressing the esophagus against the trachea.  He discussed with the patient and his family at bedside that this is very likely at least a contributing factor to his dysphagia given the large size of the diverticulum relative to the small area where it existed.      SLP Plan  Continue with current plan of care      Recommendations for follow up therapy are one component of a multi-disciplinary discharge planning process, led by the attending physician.  Recommendations may be updated based on patient status, additional functional criteria and insurance authorization.    Recommendations  Diet recommendations: NPO Medication Administration: Via alternative means                  Oral care QID;Oral care prior to ice chip/H20   Intermittent Supervision/Assistance Dysphagia, pharyngoesophageal phase (R13.14)     Continue with current plan of care    Brian Dunn L. Brian Frederic, MA CCC/SLP Clinical Specialist - Acute Care SLP Acute Rehabilitation Services Office number [phone]  Brian Dunn Brian Dunn  03/20/2023, 10:03 AM

## 2023-03-20 NOTE — Progress Notes (Signed)
Pt requested abdominal binder to be removed - pt stated riding up and causing pain.  Smaller binder ordered and placed at bedside.  Pt requested to not apply smaller binder at this time.

## 2023-03-20 NOTE — Progress Notes (Addendum)
  Progress Note    03/20/2023 7:35 AM 1 Day Post-Op  Subjective:  feeling better since starting tube feeds; R arm/neck pain improving   Vitals:   03/19/23 1935 03/19/23 2305  BP: (!) 136/53 137/78  Pulse: 65 63  Resp: 18 16  Temp: 97.6 F (36.4 C) 98 F (36.7 C)  SpO2: 94% 96%   Physical Exam: Lungs:  non labored Incisions:  R neck and groins well appearing Extremities:  palpable radial pulses Neurologic: A&O  CBC    Component Value Date/Time   WBC 9.5 03/19/2023 0425   RBC 2.55 (L) 03/19/2023 0425   HGB 8.2 (L) 03/19/2023 0425   HGB 11.1 (L) 01/31/2022 0920   HCT 24.9 (L) 03/19/2023 0425   HCT 32.7 (L) 01/31/2022 0920   PLT 252 03/19/2023 0425   PLT 262 01/31/2022 0920   MCV 97.6 03/19/2023 0425   MCV 96 01/31/2022 0920   MCV 95 (A) 02/27/2021 0000   MCH 32.2 03/19/2023 0425   MCHC 32.9 03/19/2023 0425   RDW 13.2 03/19/2023 0425   RDW 11.8 01/31/2022 0920   LYMPHSABS 1.5 01/12/2020 1005   MONOABS 0.3 01/22/2007 0923   EOSABS 0.2 01/12/2020 1005   BASOSABS 0.0 01/12/2020 1005    BMET    Component Value Date/Time   NA 136 03/20/2023 0446   NA 136 02/07/2023 1007   K 4.4 03/20/2023 0446   CL 102 03/20/2023 0446   CO2 26 03/20/2023 0446   GLUCOSE 216 (H) 03/20/2023 0446   BUN 28 (H) 03/20/2023 0446   BUN 27 02/07/2023 1007   CREATININE 0.72 03/20/2023 0446   CREATININE 1.26 (H) 12/20/2022 0949   CALCIUM 8.3 (L) 03/20/2023 0446   GFRNONAA >60 03/20/2023 0446   GFRNONAA 57 (L) 12/20/2022 0949   GFRAA 76 11/07/2020 1442    INR    Component Value Date/Time   INR 1.2 03/11/2023 1742     Intake/Output Summary (Last 24 hours) at 03/20/2023 0735 Last data filed at 03/20/2023 0520 Gross per 24 hour  Intake 1783.54 ml  Output 155 ml  Net 1628.54 ml     Assessment/Plan:  82 y.o. male is s/p Transposition of right SCA to right CCA with ligation of Kommerell Diverticulum, thoracic aortic endovascular stent graft, stent of left CIA and EIA  1 Day  Post-Op   Subjectively R arm and neck pain improving.  CTA neck showing only post surgical changes.  Patent carotid subclavian bypass.  CT chest with atelectasis vs pneumonia.  On exam he does not appear ill and is afebrile.  Encouraged IS.  OOB with therapy.  Continue abd binder per Surgery team.   Brian Rutter, PA-C [facility] [phone] 03/20/2023 7:35 AM  I have independently interviewed and examined patient and agree with PA assessment and plan above.  Appreciate general surgery placing feeding tube yesterday and will advance tube feeds as tolerates per nutrition.  Hopefully SLP will evaluate patient again today.  Disposition pending patient obtaining adequate caloric intake.  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  Brian Dunn C. Randie Heinz, MD [facility] [place] Office: [phone] Pager: [phone]

## 2023-03-20 NOTE — Progress Notes (Signed)
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 s/p 5/7 transposition of right SCA to right CCA with ligation of Kommerell Diverticulum, thoracic aortic endovascular stent graft, stent of left CIA and EIA. 5/15 s/p PEG tube placement by general surgery; MD will advance tube feeds as tolerated; Disposition pending patient obtaining adequate caloric intake. PMH: CAD, COPD, CKD, Dysphasgia, Essential HTN, Osteopenia.    PT Comments    Pt received in recliner, pleasantly agreeable to therapy session with improved standing/gait tolerance this date. Session emphasis on gait progression using bilateral platform rollator (for improved upright posture), safety with transfers/bed mobility s/p PEG tube placement yesterday with abdominal protection and importance of keeping abdominal binder donned and HOB >30* while tube feeds running. Pt needing up to minA for log roll transfer to return to supine from EOB and up to min guard for transfers/gait with AD. Recommend staff change out abdominal binder for smaller size due to discomfort expressed by pt at his armpits, staff notified. Pt continues to benefit from PT services to progress toward functional mobility goals.   Recommendations for follow up therapy are one component of a multi-disciplinary discharge planning process, led by the attending physician.  Recommendations may be updated based on patient status, additional functional criteria and insurance authorization.  Follow Up Recommendations       Assistance Recommended at Discharge Frequent or constant Supervision/Assistance  Patient can return home with the following A little help with walking and/or transfers;A little help with bathing/dressing/bathroom;Assistance with cooking/housework;Assist for transportation;Help with stairs or ramp  for entrance   Equipment Recommendations  Rolling walker (2 wheels);Other (comment) (bilateral platform rollator vs bil platform RW; pt is aware rollator may not be covered)    Recommendations for Other Services       Precautions / Restrictions Precautions Precautions: Fall Precaution Comments: now with PEG placed 5/15, needs abdominal binder on at all times and no HOB <30* when tube feeds running; abd protection precs Restrictions Weight Bearing Restrictions: No     Mobility  Bed Mobility Overal bed mobility: Needs Assistance Bed Mobility: Sit to Sidelying, Rolling Rolling: Supervision       Sit to sidelying: Min assist General bed mobility comments: minA for sit>sidelying with BLE assist, for abdominal protection s/p feeding tube placement    Transfers Overall transfer level: Needs assistance Equipment used:  (bil platform rollator) Transfers: Sit to/from Stand Sit to Stand: Min guard           General transfer comment: Cues for hand placement and use of brakes with new device    Ambulation/Gait Ambulation/Gait assistance: Min guard, Supervision Gait Distance (Feet): 175 Feet Assistive device:  (bil platform rollator) Gait Pattern/deviations: Step-through pattern, Decreased step length - right, Decreased step length - left, Trunk flexed Gait velocity: slowed     General Gait Details: Pt required cues for posture, activity pacing, use of rollator brakes and close monitoring of vitals throughout. SpO2 WFL on RA when signal reading well; HR WFL 80's bpm.   Stairs             Wheelchair Mobility    Modified Rankin (Stroke Patients Only)       Balance Overall balance assessment: Mild deficits observed, not formally tested  Cognition Arousal/Alertness: Awake/alert Behavior During Therapy: WFL for tasks assessed/performed Overall Cognitive Status: Within Functional Limits for tasks  assessed                                 General Comments: HoH but good participation, pt very alert and motivated today.        Exercises Other Exercises Other Exercises: IS x 10 reps, pt achieves ~500-751mL    General Comments General comments (skin integrity, edema, etc.): BP 118/58 (75) seated in chiar with feet down; no c/o dizziness with transfers; HR/SpO2 [facility] on RA.      Pertinent Vitals/Pain Pain Assessment Pain Assessment: Faces Faces Pain Scale: Hurts a little bit Pain Location: under L armpit where abdominal binder apparently is chafing his skin Pain Descriptors / Indicators: Discomfort, Grimacing Pain Intervention(s): Monitored during session, Repositioned, Other (comment) (washcloth applied at area of discomfort; RN notified, he may need a smaller binder or could try foam dressing there?)           PT Goals (current goals can now be found in the care plan section) Acute Rehab PT Goals Patient Stated Goal: To get enough nutrition and be able to go home PT Goal Formulation: With patient Time For Goal Achievement: 03/28/23 Progress towards PT goals: Progressing toward goals    Frequency    Min 1X/week      PT Plan Current plan remains appropriate       AM-PAC PT "6 Clicks" Mobility   Outcome Measure  Help needed turning from your back to your side while in a flat bed without using bedrails?: None Help needed moving from lying on your back to sitting on the side of a flat bed without using bedrails?: A Little (post-feeding tube placement) Help needed moving to and from a bed to a chair (including a wheelchair)?: A Little Help needed standing up from a chair using your arms (e.g., wheelchair or bedside chair)?: A Little Help needed to walk in hospital room?: A Little Help needed climbing 3-5 steps with a railing? : A Little 6 Click Score: 19    End of Session Equipment Utilized During Treatment: Other (comment) (abd binder) Activity  Tolerance: Patient tolerated treatment well Patient left: in bed;with call bell/phone within reach;with bed alarm set;Other (comment) (HOB ~40*) Nurse Communication: Mobility status;Other (comment) (may need lock on HOB to maintain >30* while tube feeds running, pt was agreeable to [facility] staying elevated; pt requests shorter abdominal binder, it's chafing his armpit) PT Visit Diagnosis: Muscle weakness (generalized) (M62.81);Difficulty in walking, not elsewhere classified (R26.2)     Time: 4098-1191 PT Time Calculation (min) (ACUTE ONLY): 33 min  Charges:  $Gait Training: 8-22 mins $Therapeutic Activity: 8-22 mins                     Meziah Blasingame P., PTA Acute Rehabilitation Services Secure Chat Preferred 9a-5:30pm Office: [phone]    Dorathy Kinsman [facility] 03/20/2023, 3:02 PM

## 2023-03-20 NOTE — Progress Notes (Signed)
PHARMACY - TOTAL PARENTERAL NUTRITION CONSULT NOTE  Indication:  Esophageal stricture with failed attempts at placing enteral access.  Patient Measurements: Height: 5\' 9"  (175.3 cm) Weight: 59.4 kg (131 lb) IBW/kg (Calculated) : 70.7 TPN AdjBW (KG): 59.8 Body mass index is 19.35 kg/m. Usual weight = 175 lbs, lost weight over many years  Assessment:  1 YOM presented on 5/7 for transposition of R SCA to R CCA with ligation of Kommerell diverticulum, thoracic aortic endovascular stent graft, and stenting of left CIA and EIA.  Patient was started on dysphagia diet on 5/8 and then downgraded to dysphagia 1 diet on 5/9.  Unable to place a cortrak, NG tube and G tube.  Patient has esophageal dysfunction/dysphagia with severe malnutrition per documentation. Pharmacy consulted to dose TPN.  Patient has inadequate nutrition for 8 days since admission and PO intake was declining in the outpatient setting.  PEG placed 5/15 and TF initiated.  Glucose / Insulin: no hx DM  - CBGs elevated on TPN and TF Used 14 units SSI in the past 24 hrs (higher usage post-op and since TF initiation) Electrolytes: refeeding resolving - all WNL (K 4.4 and Phos 3.6 post KPhos 30 mmol and KCL x 2 runs, Mag 2.3 post 2gm IV) Renal: SCr < 1, BUN up to 28 Hepatic: LFTs / TG WNL, tbili normalized, albumin 2.1 Intake / Output; MIVF: UOP 0.1 ml/kg/hr, LBM 5/15 GI Imaging: none since TPN initiation GI Surgeries / Procedures:  5/15 PEG placement  Central access: PICC placed 03/17/23 TPN start date: 03/18/23  Nutritional Goals: RD Estimated Needs Total Energy Estimated Needs: 1850-2100 kcal Total Protein Estimated Needs: 85-95 grams Total Fluid Estimated Needs: >1.8 L  Current Nutrition:  TPN Jevity 1.5 at 20 ml/hr (goal rate 55 ml/hr) Prosource daily  Plan:  Discussed with Vascular, continue TPN at 45 ml/hr (goal rate 75 ml/hr) - not advancing with TF providing > 50% of needs prior to the next TPN bag at 1800. TPN  provides 53g AA, 173g CHO, 33g ILE and 1133 kCal, meeting ~60% of needs Electrolytes in TPN: Na 28mEq/L, K 68mEq/L, Mag 19mEq/L, Ca 58mEq/L, Phos 17mmol/L, Cl:Ac 1:1 Add daily multivitamin and trace elements to TPN (D/C PO mvi) Continue sensitive SSI Q4H and add 2 units Q4H TF coverage as anticipating patient coming off of TPN Thiamine 100mg  IV daily x 6 days - consider stopping in AM Standard TPN labs Mon/Thurs - labs in AM (D/C twice daily Mag/Phos) F/U with TF tolerance/advancement to possibly stop TPN in AM  Brian Dunn, PharmD, BCPS, BCCCP 03/20/2023, 8:54 AM

## 2023-03-21 LAB — GLUCOSE, CAPILLARY
Glucose-Capillary: 100 mg/dL — ABNORMAL HIGH (ref 70–99)
Glucose-Capillary: 124 mg/dL — ABNORMAL HIGH (ref 70–99)
Glucose-Capillary: 139 mg/dL — ABNORMAL HIGH (ref 70–99)
Glucose-Capillary: 82 mg/dL (ref 70–99)
Glucose-Capillary: 96 mg/dL (ref 70–99)
Glucose-Capillary: 99 mg/dL (ref 70–99)

## 2023-03-21 LAB — BASIC METABOLIC PANEL
Anion gap: 8 (ref 5–15)
BUN: 37 mg/dL — ABNORMAL HIGH (ref 8–23)
CO2: 26 mmol/L (ref 22–32)
Calcium: 8.4 mg/dL — ABNORMAL LOW (ref 8.9–10.3)
Chloride: 104 mmol/L (ref 98–111)
Creatinine, Ser: 0.71 mg/dL (ref 0.61–1.24)
GFR, Estimated: >60 mL/min (ref >60)
Glucose, Bld: 115 mg/dL — ABNORMAL HIGH (ref 70–99)
Potassium: 4.1 mmol/L (ref 3.5–5.1)
Sodium: 138 mmol/L (ref 135–145)

## 2023-03-21 LAB — MAGNESIUM: Magnesium: 2 mg/dL (ref 1.7–2.4)

## 2023-03-21 LAB — PHOSPHORUS: Phosphorus: 3.4 mg/dL (ref 2.5–4.6)

## 2023-03-21 MED ORDER — FOOD THICKENER (SIMPLYTHICK): 10.0000 | ORAL | Status: DC | PRN

## 2023-03-21 MED ORDER — TRAVASOL 10 % IV SOLN
INTRAVENOUS | Status: AC
  Filled 2023-03-21: qty 529.2

## 2023-03-21 NOTE — Progress Notes (Signed)
Mobility Specialist Progress Note:    03/21/23 1438  Mobility  Activity Ambulated with assistance in hallway  Level of Assistance Contact guard assist, steadying assist  Assistive Device Four wheel walker (bilat platform)  Distance Ambulated (ft) 288 ft  Activity Response Tolerated well  Mobility Referral Yes  $Mobility charge 1 Mobility  Mobility Specialist Start Time (ACUTE ONLY) 1408  Mobility Specialist Stop Time (ACUTE ONLY) 1438  Mobility Specialist Time Calculation (min) (ACUTE ONLY) 30 min   Pt received in bed eager to ambulate. C/o pain at subclavian incision site otherwise no c/o. Pt assisted to chair with call bell and necessities at hand.     Pre Mobility Hr 84 During Mobility Hr 87   Post Mobility Hr 80 SPO2 98% BP 120/81  Thompson Grayer Mobility Specialist  Please contact vis Secure Chat or  Rehab Office [phone]

## 2023-03-21 NOTE — Progress Notes (Addendum)
[facility] [place]  Subjective  - no new complaints other than continue pain at right side of chest/neck area.     Objective 129/75 68 (!) 97.2 F (36.2 C) (Oral) 18 100%  Intake/Output Summary (Last 24 hours) at 03/21/2023 0737 Last data filed at 03/21/2023 1610 Gross per 24 hour  Intake 1729.36 ml  Output 376 ml  Net 1353.36 ml    Right UE radial pulse  Right neck/groin incision healing well with out hematoma Peg tube in place General no acute distress  Assessment/Planning: POD # 2  82 y.o. male is s/p Transposition of right SCA to right CCA with ligation of Kommerell Diverticulum, thoracic aortic endovascular stent graft, stent of left CIA and EIA  1 Day Post-Op   Right neck/groin incision healing well Speech clear Moving extremities with palpable radial pulse right UE. Continue plan for OP SLP, f/u appt with ENT at [facility] in June, repeat MBS as OP in 4-6 weeks.   Brian Dunn 03/21/2023 7:37 AM --  Laboratory Lab Results: Recent Labs    03/19/23 0425  WBC 9.5  HGB 8.2*  HCT 24.9*  PLT 252   BMET Recent Labs    03/20/23 0446 03/21/23 0509  NA 136 138  K 4.4 4.1  CL 102 104  CO2 26 26  GLUCOSE 216* 115*  BUN 28* 37*  CREATININE 0.72 0.71  CALCIUM 8.3* 8.4*    COAG Lab Results  Component Value Date   INR 1.2 03/11/2023   INR 1.1 03/07/2023   No results found for: "PTT"   I have independently interviewed and examined patient and agree with PA assessment and plan above.  Will advance tube feeds towards goal and hopefully have set up early next week for home tube feeds and he will follow-up with ENT for oropharyngeal dysphagia.  Aly Hauser C. Randie Heinz, MD [facility] [place] Office: [phone] Pager: [phone]

## 2023-03-21 NOTE — Progress Notes (Signed)
Speech Language Pathology Treatment: Dysphagia  Patient Details Name: Brigg Mackler MRN: [id number] DOB: 08-May-1941 Today's Date: 03/21/2023 Time: 4098-1191 SLP Time Calculation (min) (ACUTE ONLY): 30 min  Assessment / Plan / Recommendation Clinical Impression  Mr. Iadarola is showing definite signs of improvement with his swallowing today.  He was able to drink sips of nectar thick liquids with no coughing, only occasional/mild throat-clearing (<10% of the time).  Thin liquids continued to elicit more consistent s/s of aspiration. He is working on his pharyngeal exercises with only occasional verbal/visual cues for placement/execution (tongue anchor/Masako).    Orders were written for nectar liquids; sign posted over bed.  Conveyed results to Dr. Randie Heinz and Mrs. Shannahan.  Mr. Hegewald was happily sipping on nectar-thick coffee when I left the room.  SLP will continue to follow while admitted.   HPI HPI: Pt is an 82 y/o male who presented for surgery, with the diagnosis of Kommerell's diverticulum. Pt s/p transposition of right SCA to right CCA with ligation of Kommerell Diverticulum, thoracic aortic endovascular stent graft, stent of left CIA and EIA 5/7.  G-tube 5/15. PMH: vascular disease and CAD. MBS 01/13/23: lordosis of his cervical spine, thinning of the prevertebral space, and what appears to be hypertrophy in the mid-superior pharynx (presents similarly to CP bar but located anatomically higher). Poor base-of-tongue to pharyngeal wall contact and the epiglottis is unable to invert past its horizontal position. There is good laryngeal elevation; the arytenoids never fully meet the epiglottic petiole, minimal distension of the PES. Factors led to significant residue that accumulates in the pharynx, regardess of consistency, and is aspirated either during or after the swallow response. A regular texture diet with thin liquids was recommended with observance of swallowing precautions considering the  severity of symptoms across consistencies. ENT referral at [facility] and outpatient SLP services recommended. Per Dr. Randie Heinz,  CT scan appeared to show the diverticulum to be compressing the esophagus against the trachea.  He discussed with the patient and his family at bedside that this is very likely at least a contributing factor to his dysphagia given the large size of the diverticulum relative to the small area where it existed.      SLP Plan  Continue with current plan of care      Recommendations for follow up therapy are one component of a multi-disciplinary discharge planning process, led by the attending physician.  Recommendations may be updated based on patient status, additional functional criteria and insurance authorization.    Recommendations  Diet recommendations: Nectar-thick liquid Liquids provided via: Cup Medication Administration: Via alternative means Supervision: Patient able to self feed Compensations: Small sips/bites                  Oral care QID   Intermittent Supervision/Assistance Dysphagia, pharyngoesophageal phase (R13.14)     Continue with current plan of care   Hilda Wexler L. Samson Frederic, MA CCC/SLP Clinical Specialist - Acute Care SLP Acute Rehabilitation Services Office number [phone]   [name]  03/21/2023, 3:15 PM

## 2023-03-21 NOTE — Progress Notes (Signed)
PHARMACY - TOTAL PARENTERAL NUTRITION CONSULT NOTE  Indication:  Esophageal stricture with failed attempts at placing enteral access.  Patient Measurements: Height: 5\' 9"  (175.3 cm) Weight: 60.1 kg (132 lb 7.9 oz) IBW/kg (Calculated) : 70.7 TPN AdjBW (KG): 59.8 Body mass index is 19.57 kg/m. Usual weight = 175 lbs, lost weight over many years  Assessment:  24 YOM presented on 5/7 for transposition of R SCA to R CCA with ligation of Kommerell diverticulum, thoracic aortic endovascular stent graft, and stenting of left CIA and EIA.  Patient was started on dysphagia diet on 5/8 and then downgraded to dysphagia 1 diet on 5/9.  Unable to place a cortrak, NG tube and G tube.  Patient has esophageal dysfunction/dysphagia with severe malnutrition per documentation. Pharmacy consulted to dose TPN.  Patient has inadequate nutrition for 8 days since admission and PO intake was declining in the outpatient setting.  PEG placed 5/15 and TF initiated.  Glucose / Insulin: no hx DM  - CBGs <180 since TPN hung on 03/20/2023 on TPN and TF Used 18 units SSI in the past 24 hrs (higher usage post-op and since TF initiation) Electrolytes: refeeding resolving - all WNL (K 4.1, Phos 3.4, Mag 2.0) Renal: SCr < 1, BUN up to 37 Hepatic: LFTs / TG WNL, tbili normalized, albumin 2.1 Intake / Output; MIVF: UOP 0.14 ml/kg/hr + 2 unmeasured, LBM 5/15 GI Imaging: none since TPN initiation GI Surgeries / Procedures:  5/15 PEG placement  Central access: PICC placed 03/17/23 TPN start date: 03/18/23  Nutritional Goals: RD Estimated Needs Total Energy Estimated Needs: 1850-2100 kcal Total Protein Estimated Needs: 85-95 grams Total Fluid Estimated Needs: >1.8 L  Current Nutrition:  TPN Jevity 1.5 at 40 ml/hr (goal rate 55 ml/hr) Prosource daily  Plan:  5/17/AM update: Spoke with nurse this morning regarding him tolerating the TF. She stated that he had a little lower abdominal discomfort and tried to have a BM but  was unable. She did give a dose dilaudid. He has not had any issues with the TF. No nausea. No vomiting. No antiemetics given. Currently running at 40 ml/hr (~75% of goal rate) with his next rate increase at 16:00  Discussed with Vascular, continue TPN at 45 ml/hr (goal rate 75 ml/hr) - not advancing with TF providing > 50% of needs prior to the next TPN bag at 1800. TPN provides 53g AA, 173g CHO, 33g ILE and 1133 kCal, meeting ~60% of needs Electrolytes in TPN: Na 81mEq/L, K 60 mEq/L, Mag 6 mEq/L, Ca 5 mEq/L, Phos 18 mmol/L, Cl:Ac 1:1 Add daily multivitamin and trace elements to TPN (D/C PO mvi) Continue sensitive SSI Q4H and add 2 units Q4H TF coverage as anticipating patient coming off of TPN Standard TPN labs Mon/Thurs F/U with TF tolerance/advancement to possibly stop TPN in AM   Yeira Gulden BS, PharmD, BCPS Clinical Pharmacist 03/21/2023 11:30 AM  Contact: [phone] after 3 PM  "Be curious, not judgmental..." -Debbora Dus

## 2023-03-21 NOTE — TOC Initial Note (Signed)
Transition of Care (TOC) - Initial/Assessment Note  Donn Pierini RN, BSN Transitions of Care Unit 4E- RN Case Manager See Treatment Team for direct phone #   Patient Details  Name: Brian Dunn MRN: [id number] Date of Birth: Jul 27, 1941  Transition of Care [facility]) CM/SW Contact:    Darrold Span, RN Phone Number: 03/21/2023, 3:48 PM  Clinical Narrative:                 CM met with pt at bedside to discuss transition of care needs.  Pt has PEG tube with TF and will need TF for home on discharge. CM reached out to RD this am and was told that they will see pt again Monday AM to re-assess TF needs for home and make recommendations.   CM discussed with pt needing a provider for his home TF needs- pt voiced he does not have a preference and will defer to CM to arrange on his behalf. Also discussed HH- CM has been notified by [facility] Fall[facility] liaison Bjorn Loser) -following patient with MD office protocol referral prearranged for [facility] needs- CM explained to pt [facility] choice and office referral- pt voiced he is ok with using [facility] for [facility] needs- CM has confirmed with Bjorn Loser that they can staff for RN/SLP- CM will watch for any further [facility] needs ?PT.   Also discussed DME needs- pt voiced that he would like a rollator for home, noted PT has made notes about possible platform walker vs platform rollator, CM has reached out to PT and confirmed they are recommending a BIL platform rollator. This is something that in house providers do not carry- CM will f/u on Monday and let him know where he can get one from.   CM reached out to Liaison-Pam for [facility] to give referral for home TF needs pending RD recommendations- Pam will f/u with her team and see pt first of the week for home TF needs and education.  CM to continue to follow to assist in coordinating Home needs- will see pt on Monday 5/20   Expected Discharge Plan: Home w Home Health Services Barriers to Discharge: Continued Medical Work up   Patient  Goals and CMS Choice            Expected Discharge Plan and Services                                     [facility] Agency: [facility]     Representative spoke with at [facility] Agency: Bjorn Loser  Prior Living Arrangements/Services                       Activities of Daily Living Home Assistive Devices/Equipment: Blood pressure cuff, Grab bars in shower, Hearing aid ADL Screening (condition at time of admission) Patient's cognitive ability adequate to safely complete daily activities?: Yes Is the patient deaf or have difficulty hearing?: Yes Does the patient have difficulty seeing, even when wearing glasses/contacts?: No Does the patient have difficulty concentrating, remembering, or making decisions?: No Patient able to express need for assistance with ADLs?: Yes Does the patient have difficulty dressing or bathing?: No Independently performs ADLs?: Yes (appropriate for developmental age) Does the patient have difficulty walking or climbing stairs?: Yes Weakness of Legs: None Weakness of Arms/Hands: None  Permission Sought/Granted  Emotional Assessment              Admission diagnosis:  Kommerell's diverticulum [Q25.49] Patient Active Problem List   Diagnosis Date Noted   Esophageal dysmotility 03/15/2023   Protein-calorie malnutrition, severe 03/14/2023   Kommerell's diverticulum 03/11/2023   Preop cardiovascular exam 02/26/2023   Aneurysm of right subclavian artery (HCC) 02/07/2023   Dysphagia 12/27/2022   Encounter for fitting and adjustment of hearing aid 01/29/2022   Chronic kidney disease 01/28/2022   Gout 01/28/2022   Sensorineural hearing loss, bilateral 01/28/2022   Chronic ischemic heart disease, unspecified 01/28/2022   Anemia in chronic kidney disease 02/27/2021   Allergic rhinitis    Hypertension    Angina, class III (HCC) 11/07/2020   Angina pectoris (HCC) 11/07/2020   COPD (chronic obstructive pulmonary disease)  (HCC)    GERD (gastroesophageal reflux disease)    Hypogonadism male    Osteopenia    Essential hypertension 11/06/2017   Postoperative visit 08/14/2016   Bilateral inguinal hernia without obstruction or gangrene 06/20/2016   Coronary artery disease involving native coronary artery of native heart with angina pectoris (HCC) 06/20/2016   Hyperlipidemia with target LDL less than 70 06/20/2016   Benign essential hypertension 06/20/2016   CAD (coronary artery disease), native coronary artery 06/20/2016   Hyperlipidemia 06/20/2016   Multiple lung nodules on CT 11/21/2015   Low back pain 09/09/2013   Asthma 06/18/2012   Acute bronchitis 05/29/2012   Cough 03/25/2012   Cough variant asthma 10/18/2010   HYPOGONADISM 02/18/2007   Tremor, essential 02/18/2007   GERD 01/22/2007   Disorder of bone and cartilage 01/22/2007   CHEST PAIN 01/22/2007   HIP FRACTURE 01/22/2007   HX, PERSONAL, MALIGNANCY, PROSTATE 01/22/2007   Hip fracture (HCC) 01/22/2007   PCP:  Noni Saupe, MD Pharmacy:   [id number] - [place], [place] - [address] AT [address] [place] [address] Phone: [phone] Fax: [phone]  [facility] - [place], [place] - [address] [place] [address] Phone: [phone] Fax: [phone]  [facility] - [place], [place] - [address] [place] [address] Phone: [phone] Fax: [phone]     Social Determinants of Health (SDOH) Social History: SDOH Screenings   Food Insecurity: No Food Insecurity (03/19/2023)  Housing: Low Risk  (03/19/2023)  Transportation Needs: No Transportation Needs (03/19/2023)  Utilities: Not At Risk (03/19/2023)  Tobacco Use: Medium Risk (03/20/2023)   SDOH Interventions:     Readmission Risk Interventions     No data to display

## 2023-03-21 NOTE — Plan of Care (Signed)
  Problem: Skin Integrity: Goal: Demonstration of wound healing without infection will improve Outcome: Progressing   Problem: Education: Goal: Knowledge of General Education information will improve Description: Including pain rating scale, medication(s)/side effects and non-pharmacologic comfort measures Outcome: Progressing   Problem: Health Behavior/Discharge Planning: Goal: Ability to manage health-related needs will improve Outcome: Progressing   Problem: Clinical Measurements: Goal: Ability to maintain clinical measurements within normal limits will improve Outcome: Progressing Goal: Will remain free from infection Outcome: Progressing Goal: Cardiovascular complication will be avoided Outcome: Progressing   Problem: Activity: Goal: Risk for activity intolerance will decrease Outcome: Progressing   Problem: Nutrition: Goal: Adequate nutrition will be maintained Outcome: Progressing   Problem: Elimination: Goal: Will not experience complications related to bowel motility Outcome: Progressing Goal: Will not experience complications related to urinary retention Outcome: Progressing   Problem: Pain Managment: Goal: General experience of comfort will improve Outcome: Progressing

## 2023-03-21 NOTE — Progress Notes (Signed)
Physical Therapy Treatment Patient Details Name: Brian Dunn MRN: [id number] DOB: [birth year] Today's Date: 03/21/2023   History of Present Illness 82 year old male with the diagnosis of Kommerell's diverticulum. s/p 5/7 transposition of right SCA to right CCA with ligation of Kommerell Diverticulum, thoracic aortic endovascular stent graft, stent of left CIA and EIA. 5/15 s/p PEG tube placement by general surgery; MD will advance tube feeds as tolerated; Disposition pending patient obtaining adequate caloric intake. PMH: CAD, COPD, CKD, Dysphasgia, Essential HTN, Osteopenia.    PT Comments    Pt received in supine, agreeable to therapy session with encouragement and with good participation in transfer and gait training with bilateral platform rollator. Pt defers stair trial due to bowel/bladder urgency and pt up in bathroom at end of session, RN/NT notified. Pt checked on x2 but not yet ready to get up, pt agreeable to use wall pull call bell from toilet prior to standing up. Pt reluctant to don abdominal binder and to maintain while seated/supine, reinforced importance of keeping this on at all times per MD note, RN notified of this conversation (smaller abd binder now present in room and was placed on him during session, pt states it is more comfortable than the one that was on him previously). Pt continues to benefit from PT services to progress toward functional mobility goals.    Recommendations for follow up therapy are one component of a multi-disciplinary discharge planning process, led by the attending physician.  Recommendations may be updated based on patient status, additional functional criteria and insurance authorization.  Follow Up Recommendations       Assistance Recommended at Discharge Frequent or constant Supervision/Assistance  Patient can return home with the following A little help with walking and/or transfers;A little help with bathing/dressing/bathroom;Assistance  with cooking/housework;Assist for transportation;Help with stairs or ramp for entrance   Equipment Recommendations  Other (comment) (bilateral platform rollator; pt is aware rollator may not be covered)    Recommendations for Other Services       Precautions / Restrictions Precautions Precautions: Fall Precaution Comments: now with PEG placed 5/15, needs abdominal binder on at all times and no HOB <30* when tube feeds running; abd protection precs Restrictions Weight Bearing Restrictions: No     Mobility  Bed Mobility Overal bed mobility: Needs Assistance Bed Mobility: Rolling, Sidelying to Sit Rolling: Supervision Sidelying to sit: Min guard, HOB elevated (>30*)       General bed mobility comments: cues for log roll technique, pt receptive but only partially performs; cues for abdominal protection and keeping abd binder on, pt reluctant but PTA instructed him that smaller size binder had arrived and that it needed to stay in place per surgical MD/PA notes.    Transfers Overall transfer level: Needs assistance Equipment used:  (bil platform rollator) Transfers: Sit to/from Stand Sit to Stand: Min guard           General transfer comment: Cues for hand placement and use of brakes with new device    Ambulation/Gait Ambulation/Gait assistance: Min guard, Supervision Gait Distance (Feet): 150 Feet Assistive device:  (bil platform rollator) Gait Pattern/deviations: Step-through pattern, Decreased step length - right, Decreased step length - left, Trunk flexed Gait velocity: slowed     General Gait Details: Pt required cues for posture, activity pacing, use of rollator and close monitoring of vitals throughout. HR WFL 80's bpm standing and 60's prior to trial.   Stairs Stairs:  (defer, pt with b/b urgency needed bathroom at end of session)  Wheelchair Mobility    Modified Rankin (Stroke Patients Only)       Balance Overall balance assessment:  Mild deficits observed, not formally tested                                          Cognition Arousal/Alertness: Awake/alert Behavior During Therapy: WFL for tasks assessed/performed Overall Cognitive Status: Within Functional Limits for tasks assessed                                 General Comments: HoH but good participation, pt very alert and motivated today.        Exercises      General Comments General comments (skin integrity, edema, etc.): pt reluctant to don abdominal binder, reinforced importance of keeping it on at all times per MD notes, pt agreeable to don it sitting EOB prior to standing and to maintain it on while sitting to toilet but needed reminder each transfer to keep it on.      Pertinent Vitals/Pain Pain Assessment Pain Assessment: Faces Faces Pain Scale: Hurts little more Pain Location: R shoulder/neck incision, abdominal PEG site Pain Descriptors / Indicators: Discomfort, Grimacing Pain Intervention(s): Monitored during session, Repositioned (emphasis on importance of keeping abdominal binder donned)    Home Living                          Prior Function            PT Goals (current goals can now be found in the care plan section) Acute Rehab PT Goals Patient Stated Goal: To get enough nutrition and be able to go home PT Goal Formulation: With patient Time For Goal Achievement: 03/28/23 Progress towards PT goals: Progressing toward goals    Frequency    Min 1X/week      PT Plan Current plan remains appropriate    Co-evaluation              AM-PAC PT "6 Clicks" Mobility   Outcome Measure  Help needed turning from your back to your side while in a flat bed without using bedrails?: None Help needed moving from lying on your back to sitting on the side of a flat bed without using bedrails?: A Little (post-feeding tube placement) Help needed moving to and from a bed to a chair (including a  wheelchair)?: A Little Help needed standing up from a chair using your arms (e.g., wheelchair or bedside chair)?: A Little Help needed to walk in hospital room?: A Little Help needed climbing 3-5 steps with a railing? : A Little 6 Click Score: 19    End of Session Equipment Utilized During Treatment: Other (comment) (abd binder) Activity Tolerance: Patient tolerated treatment well Patient left: with call bell/phone within reach;Other (comment);in chair (sitting on toilet, RN/NT notified and aware; pt notified of importance of using call bell on wall, pt agreeable; checked x2 but not ready to get up yet) Nurse Communication: Mobility status;Other (comment) (pt wants to doff abdominal binder but MD note says to keep it on at all times; reinforced this with pt; pt on toilet) PT Visit Diagnosis: Muscle weakness (generalized) (M62.81);Difficulty in walking, not elsewhere classified (R26.2)     Time: 1720-1736 PT Time Calculation (min) (ACUTE ONLY): 16 min  Charges:  $Gait Training:  8-22 mins                     Florina Ou., PTA Acute Rehabilitation Services Secure Chat Preferred 9a-5:30pm Office: [phone]    Dorathy Kinsman [facility] 03/21/2023, 5:56 PM

## 2023-03-21 NOTE — Plan of Care (Signed)

## 2023-03-22 LAB — GLUCOSE, CAPILLARY
Glucose-Capillary: 110 mg/dL — ABNORMAL HIGH (ref 70–99)
Glucose-Capillary: 123 mg/dL — ABNORMAL HIGH (ref 70–99)
Glucose-Capillary: 107 mg/dL — ABNORMAL HIGH (ref 70–99)
Glucose-Capillary: 108 mg/dL — ABNORMAL HIGH (ref 70–99)
Glucose-Capillary: 103 mg/dL — ABNORMAL HIGH (ref 70–99)

## 2023-03-22 NOTE — Plan of Care (Signed)
  Problem: Education: Goal: Knowledge of discharge needs will improve Outcome: Progressing   Problem: Clinical Measurements: Goal: Postoperative complications will be avoided or minimized Outcome: Progressing   Problem: Respiratory: Goal: Ability to achieve and maintain a regular respiratory rate will improve Outcome: Progressing   Problem: Skin Integrity: Goal: Demonstration of wound healing without infection will improve Outcome: Progressing   Problem: Health Behavior/Discharge Planning: Goal: Ability to manage health-related needs will improve Outcome: Progressing   Problem: Clinical Measurements: Goal: Ability to maintain clinical measurements within normal limits will improve Outcome: Progressing Goal: Will remain free from infection Outcome: Progressing   Problem: Activity: Goal: Risk for activity intolerance will decrease Outcome: Progressing   Problem: Nutrition: Goal: Adequate nutrition will be maintained Outcome: Progressing   Problem: Elimination: Goal: Will not experience complications related to urinary retention Outcome: Progressing   Problem: Pain Managment: Goal: General experience of comfort will improve Outcome: Progressing

## 2023-03-22 NOTE — Progress Notes (Signed)
Physical Therapy Treatment Patient Details Name: Brian Dunn MRN: [id number] DOB: August 17, 1941 Today's Date: 03/22/2023   History of Present Illness 82 year old male with the diagnosis of Kommerell's diverticulum. s/p 5/7 transposition of right SCA to right CCA with ligation of Kommerell Diverticulum, thoracic aortic endovascular stent graft, stent of left CIA and EIA. 5/15 s/p PEG tube placement by general surgery; MD will advance tube feeds as tolerated; Disposition pending patient obtaining adequate caloric intake. PMH: CAD, COPD, CKD, Dysphasgia, Essential HTN, Osteopenia.    PT Comments    Pt received in supine, agreeable to therapy session with emphasis on abdominal protection precs during all functional mobility tasks (use of abd binder, log rolling for bed mobility), activity pacing and seated/standing BLE exercises for strengthening. Pt needing up to minA for sit<>stand from bed height after reciprocal repetitions without AD and min guard for stair negotiation in room with BUE support. Pt reports 4-5/10 modified RPE (fatigue) at end of session. Pt continues to benefit from PT services to progress toward functional mobility goals.    Recommendations for follow up therapy are one component of a multi-disciplinary discharge planning process, led by the attending physician.  Recommendations may be updated based on patient status, additional functional criteria and insurance authorization.  Follow Up Recommendations       Assistance Recommended at Discharge Frequent or constant Supervision/Assistance  Patient can return home with the following A little help with walking and/or transfers;A little help with bathing/dressing/bathroom;Assistance with cooking/housework;Assist for transportation;Help with stairs or ramp for entrance   Equipment Recommendations  Other (comment) (bilateral platform rollator; pt is aware rollator may not be covered)    Recommendations for Other Services        Precautions / Restrictions Precautions Precautions: Fall Precaution Comments: now with PEG placed 5/15, needs abdominal binder on at all times and HOB >30* when tube feeds running; abd protection precs Restrictions Weight Bearing Restrictions: No     Mobility  Bed Mobility Overal bed mobility: Needs Assistance Bed Mobility: Rolling, Sidelying to Sit Rolling: Supervision Sidelying to sit: Min guard, HOB elevated (>30*)     Sit to sidelying: Min guard, HOB elevated General bed mobility comments: cues for log roll technique, pt receptive but only partially performs; cues for abdominal protection and keeping abd binder on, pt reluctant but PTA instructed him that smaller size binder had arrived and that it needed to stay in place per surgical MD/PA notes.    Transfers Overall transfer level: Needs assistance Equipment used: 1 person hand held assist Transfers: Sit to/from Stand, Bed to chair/wheelchair/BSC Sit to Stand: Min guard   Step pivot transfers: Min assist       General transfer comment: cues for safe UE placement; no AD as pt using +1 HHA and mostly exercises at bedside so did not use RW    Ambulation/Gait Ambulation/Gait assistance: Min guard Gait Distance (Feet): 8 Feet Assistive device: 1 person hand held assist Gait Pattern/deviations: Step-through pattern, Decreased step length - right, Decreased step length - left, Trunk flexed Gait velocity: slowed     General Gait Details: short distance at bedside; mostly emphasis on seated/standing exercises and stairs in room   Stairs Stairs: Yes Stairs assistance: Min guard Stair Management: Two rails, Step to pattern, Forwards Number of Stairs: 10 (7" platform in room x10 reps) General stair comments: cues for posture, safe step sequencing, and activity pacing; no overt LOB, pt  needing only min guard and BUE support from counterop in front of step  Wheelchair Mobility    Modified Rankin (Stroke Patients  Only)       Balance Overall balance assessment: Mild deficits observed, not formally tested                                          Cognition Arousal/Alertness: Awake/alert Behavior During Therapy: WFL for tasks assessed/performed Overall Cognitive Status: Within Functional Limits for tasks assessed                                 General Comments: HoH but good participation, pt very alert and motivated today.        Exercises Other Exercises Other Exercises: reciprocal STS x5 reps reps using single UE Other Exercises: BLE AROM: standing hip flexion x10 reps with high knees Other Exercises: 7" platform step-ups and down x10 reps for BLE strengthening    General Comments General comments (skin integrity, edema, etc.): pt assisted to don abdominal binder while sitting EOB prior to OOB mobility; emphasized importance of him keeping it on as he was reluctant to maintain it once returning to bed. Pt agreeable to keep it on if PTA loosened it once back to bed, RN notified. VSS on RA.      Pertinent Vitals/Pain Pain Assessment Pain Assessment: Faces Faces Pain Scale: Hurts a little bit Pain Location: R shoulder/neck incision, abdominal PEG site Pain Descriptors / Indicators: Grimacing, Discomfort Pain Intervention(s): Monitored during session, Repositioned     PT Goals (current goals can now be found in the care plan section) Acute Rehab PT Goals Patient Stated Goal: To get enough nutrition and be able to go home PT Goal Formulation: With patient Time For Goal Achievement: 03/28/23 Progress towards PT goals: Progressing toward goals    Frequency    Min 1X/week      PT Plan Current plan remains appropriate       AM-PAC PT "6 Clicks" Mobility   Outcome Measure  Help needed turning from your back to your side while in a flat bed without using bedrails?: None Help needed moving from lying on your back to sitting on the side of a flat  bed without using bedrails?: A Little (post-feeding tube placement, no rails) Help needed moving to and from a bed to a chair (including a wheelchair)?: A Little Help needed standing up from a chair using your arms (e.g., wheelchair or bedside chair)?: A Little Help needed to walk in hospital room?: A Little Help needed climbing 3-5 steps with a railing? : A Little 6 Click Score: 19    End of Session Equipment Utilized During Treatment: Other (comment) (abd binder) Activity Tolerance: Patient tolerated treatment well Patient left: with call bell/phone within reach;Other (comment);in bed (bil heels floated; HOB locked >30*) Nurse Communication: Mobility status;Other (comment) (pt wants to doff abdominal binder but MD note says to keep it on at all times; reinforced this with pt) PT Visit Diagnosis: Muscle weakness (generalized) (M62.81);Difficulty in walking, not elsewhere classified (R26.2)     Time: 1610-9604 PT Time Calculation (min) (ACUTE ONLY): 16 min  Charges:  $Therapeutic Exercise: 8-22 mins                     Keveon Amsler P., PTA Acute Rehabilitation Services Secure Chat Preferred 9a-5:30pm Office: [phone]    Dorathy Kinsman [facility] 03/22/2023, 6:32  PM

## 2023-03-22 NOTE — Progress Notes (Addendum)
  Progress Note    03/22/2023 7:05 AM 3 Days Post-Op  Subjective:  says he is feeling better.  Would like some coffee this morning.  Says he has been out of bed to chair and walked in the halls.  Says he is ready to go home.   Afebrile HR 60's-80's  100's-140's systolic 100% RA  Vitals:   03/21/23 2340 03/22/23 0402  BP: 108/70 112/77  Pulse: 76 78  Resp: 20 20  Temp: 97.8 F (36.6 C) (!) 97.3 F (36.3 C)  SpO2: 100% 100%    Physical Exam: General:  no distress Cardiac:  regular Lungs:  non labored Incisions:  right chest incision and bilateral groins look good and healing nicely.  Extremities:  palpable pedal pulses   CBC    Component Value Date/Time   WBC 9.5 03/19/2023 0425   RBC 2.55 (L) 03/19/2023 0425   HGB 8.2 (L) 03/19/2023 0425   HGB 11.1 (L) 01/31/2022 0920   HCT 24.9 (L) 03/19/2023 0425   HCT 32.7 (L) 01/31/2022 0920   PLT 252 03/19/2023 0425   PLT 262 01/31/2022 0920   MCV 97.6 03/19/2023 0425   MCV 96 01/31/2022 0920   MCV 95 (A) 02/27/2021 0000   MCH 32.2 03/19/2023 0425   MCHC 32.9 03/19/2023 0425   RDW 13.2 03/19/2023 0425   RDW 11.8 01/31/2022 0920   LYMPHSABS 1.5 01/12/2020 1005   MONOABS 0.3 01/22/2007 0923   EOSABS 0.2 01/12/2020 1005   BASOSABS 0.0 01/12/2020 1005    BMET    Component Value Date/Time   NA 138 03/21/2023 0509   NA 136 02/07/2023 1007   K 4.1 03/21/2023 0509   CL 104 03/21/2023 0509   CO2 26 03/21/2023 0509   GLUCOSE 115 (H) 03/21/2023 0509   BUN 37 (H) 03/21/2023 0509   BUN 27 02/07/2023 1007   CREATININE 0.71 03/21/2023 0509   CREATININE 1.26 (H) 12/20/2022 0949   CALCIUM 8.4 (L) 03/21/2023 0509   GFRNONAA >60 03/21/2023 0509   GFRNONAA 57 (L) 12/20/2022 0949   GFRAA 76 11/07/2020 1442    INR    Component Value Date/Time   INR 1.2 03/11/2023 1742     Intake/Output Summary (Last 24 hours) at 03/22/2023 0705 Last data filed at 03/22/2023 0514 Gross per 24 hour  Intake 1736.26 ml  Output 1075 ml   Net 661.26 ml      Assessment/Plan:  82 y.o. male is s/p:  Transposition of right SCA to right CCA with ligation of Kommerell Diverticulum, thoracic aortic endovascular stent graft, stent of left CIA and EIA 03/11/2023 Dr. Randie Heinz And esophagogastroduodenoscopy (EGD) and percutaneous endoscopic gastrostomy (PEG) tube placement 03/19/2023 Dr. Bedelia Person 3 Days Post-Op   -pt continues to do well.  He has palpable pedal pulses and right chest and bilateral groins look fine.   -speech recommending nectar-thick liquid.  Recommendations for TF to come on Monday.  Hopefully home with HH early in the week -DVT prophylaxis:  sq heparin -continue to mobilize out of bed   Doreatha Massed, PA-C [facility] [phone] 03/22/2023 7:05 AM  VASCULAR STAFF ADDENDUM: I have independently interviewed and examined the patient. I agree with the above.    Fara Olden, MD [facility] Phone Number: [phone] 03/22/2023 8:53 AM

## 2023-03-23 LAB — GLUCOSE, CAPILLARY
Glucose-Capillary: 105 mg/dL — ABNORMAL HIGH (ref 70–99)
Glucose-Capillary: 106 mg/dL — ABNORMAL HIGH (ref 70–99)
Glucose-Capillary: 108 mg/dL — ABNORMAL HIGH (ref 70–99)
Glucose-Capillary: 116 mg/dL — ABNORMAL HIGH (ref 70–99)
Glucose-Capillary: 123 mg/dL — ABNORMAL HIGH (ref 70–99)
Glucose-Capillary: 97 mg/dL (ref 70–99)

## 2023-03-23 NOTE — Progress Notes (Addendum)
  Progress Note    03/23/2023 7:09 AM 4 Days Post-Op  Subjective:  sleeping, wakes easily.  Says he is doing well.  Still tolerating the po he takes in and he feels it is getting better.   Afebrile HR 60's-70's  110's-130's systolic 100% RA  Vitals:   03/23/23 0016 03/23/23 0315  BP: 120/89 132/84  Pulse: 84 76  Resp: 20 20  Temp: 97.7 F (36.5 C) 97.6 F (36.4 C)  SpO2:  100%    Physical Exam: General:  no distress Cardiac:  regular Lungs:  non labored Incisions:  bilateral groins and right chest incisions are healing nicely Extremities:  bilateral feet are warm and well perfused.   CBC    Component Value Date/Time   WBC 9.5 03/19/2023 0425   RBC 2.55 (L) 03/19/2023 0425   HGB 8.2 (L) 03/19/2023 0425   HGB 11.1 (L) 01/31/2022 0920   HCT 24.9 (L) 03/19/2023 0425   HCT 32.7 (L) 01/31/2022 0920   PLT 252 03/19/2023 0425   PLT 262 01/31/2022 0920   MCV 97.6 03/19/2023 0425   MCV 96 01/31/2022 0920   MCV 95 (A) 02/27/2021 0000   MCH 32.2 03/19/2023 0425   MCHC 32.9 03/19/2023 0425   RDW 13.2 03/19/2023 0425   RDW 11.8 01/31/2022 0920   LYMPHSABS 1.5 01/12/2020 1005   MONOABS 0.3 01/22/2007 0923   EOSABS 0.2 01/12/2020 1005   BASOSABS 0.0 01/12/2020 1005    BMET    Component Value Date/Time   NA 138 03/21/2023 0509   NA 136 02/07/2023 1007   K 4.1 03/21/2023 0509   CL 104 03/21/2023 0509   CO2 26 03/21/2023 0509   GLUCOSE 115 (H) 03/21/2023 0509   BUN 37 (H) 03/21/2023 0509   BUN 27 02/07/2023 1007   CREATININE 0.71 03/21/2023 0509   CREATININE 1.26 (H) 12/20/2022 0949   CALCIUM 8.4 (L) 03/21/2023 0509   GFRNONAA >60 03/21/2023 0509   GFRNONAA 57 (L) 12/20/2022 0949   GFRAA 76 11/07/2020 1442    INR    Component Value Date/Time   INR 1.2 03/11/2023 1742     Intake/Output Summary (Last 24 hours) at 03/23/2023 0709 Last data filed at 03/23/2023 0645 Gross per 24 hour  Intake 546.18 ml  Output 1680 ml  Net -1133.82 ml       Assessment/Plan:  82 y.o. male is s/p:  Transposition of right SCA to right CCA with ligation of Kommerell Diverticulum, thoracic aortic endovascular stent graft, stent of left CIA and EIA 03/11/2023 Dr. Randie Heinz And esophagogastroduodenoscopy (EGD) and percutaneous endoscopic gastrostomy (PEG) tube placement 03/19/2023 Dr. Bedelia Person   -pt continues to do well with slow improvement.  TPN was discontinued yesterday and pt is at goal with TF at 55cc/hr.   -DVT prophylaxis:  sq heparin -no PT follow up recommended at discharge; OT recommending bilateral platform rollator and pt is aware insurance may not cover this.  -plan is for discharge home tomorrow with TF recs and HH -continue to mobilize out of bed.    Doreatha Massed, PA-C [facility] [phone] 03/23/2023 7:09 AM  VASCULAR STAFF ADDENDUM: I have independently interviewed and examined the patient. I agree with the above.  TPN off, at goal feed.  Likely home tomorrow.  Fara Olden, MD [facility] Phone Number: [phone] 03/23/2023 8:19 AM

## 2023-03-24 ENCOUNTER — Other Ambulatory Visit (HOSPITAL_COMMUNITY): Payer: Self-pay

## 2023-03-24 DIAGNOSIS — R1310 Dysphagia, unspecified: Secondary | ICD-10-CM | POA: Diagnosis not present

## 2023-03-24 LAB — GLUCOSE, CAPILLARY
Glucose-Capillary: 128 mg/dL — ABNORMAL HIGH (ref 70–99)
Glucose-Capillary: 138 mg/dL — ABNORMAL HIGH (ref 70–99)
Glucose-Capillary: 142 mg/dL — ABNORMAL HIGH (ref 70–99)
Glucose-Capillary: 95 mg/dL (ref 70–99)

## 2023-03-24 MED ORDER — PRIMIDONE 50 MG PO TABS: 50.0000 mg | ORAL_TABLET | Freq: Every day | ORAL | Status: DC

## 2023-03-24 MED ORDER — ISOSORBIDE DINITRATE 10 MG PO TABS
10.0000 mg | ORAL_TABLET | Freq: Two times a day (BID) | ORAL | Status: DC
  Administered 2023-03-24: 10 mg
  Filled 2023-03-24 (×3): qty 1

## 2023-03-24 MED ORDER — FREE WATER
100.0000 mL | Freq: Three times a day (TID) | Status: DC
  Administered 2023-03-24: 100 mL

## 2023-03-24 MED ORDER — MULTIVITAMINS PO CAPS: 1.0000 | ORAL_CAPSULE | Freq: Every day | ORAL | Status: DC

## 2023-03-24 MED ORDER — TAMSULOSIN HCL 0.4 MG PO CAPS: 0.4000 mg | ORAL_CAPSULE | Freq: Every day | ORAL | Status: DC

## 2023-03-24 MED ORDER — PRAVASTATIN SODIUM 40 MG PO TABS: 40.0000 mg | ORAL_TABLET | Freq: Every day | ORAL | Status: DC

## 2023-03-24 MED ORDER — ASPIRIN 325 MG PO TABS
325.0000 mg | ORAL_TABLET | Freq: Every day | ORAL | 0 refills | Status: DC
  Filled 2023-03-24: qty 100, 100d supply, fill #0

## 2023-03-24 MED ORDER — CELECOXIB 200 MG PO CAPS: 200.0000 mg | ORAL_CAPSULE | Freq: Every day | ORAL | Status: DC

## 2023-03-24 MED ORDER — JEVITY 1.5 CAL/FIBER PO LIQD
1000.0000 mL | Freq: Four times a day (QID) | ORAL | Status: DC
  Administered 2023-03-24: 237 mL
  Filled 2023-03-24 (×3): qty 1000

## 2023-03-24 MED ORDER — FOOD THICKENER (SIMPLYTHICK): 1.0000 | ORAL | Status: DC | PRN

## 2023-03-24 MED ORDER — JEVITY 1.5 CAL/FIBER PO LIQD
355.0000 mL | Freq: Four times a day (QID) | ORAL | Status: DC
  Filled 2023-03-24 (×3): qty 474

## 2023-03-24 MED ORDER — FREE WATER
120.0000 mL | Freq: Four times a day (QID) | Status: DC
  Administered 2023-03-24: 120 mL

## 2023-03-24 MED ORDER — DOCUSATE SODIUM 50 MG/5ML PO LIQD
100.0000 mg | Freq: Every day | ORAL | 3 refills | Status: DC
  Filled 2023-03-24: qty 100, 10d supply, fill #0

## 2023-03-24 MED ORDER — TRAMADOL HCL 50 MG PO TABS: 50.0000 mg | ORAL_TABLET | Freq: Four times a day (QID) | ORAL | Status: DC | PRN

## 2023-03-24 MED ORDER — LORATADINE 10 MG PO TABS: 10.0000 mg | ORAL_TABLET | Freq: Every day | ORAL | Status: DC

## 2023-03-24 MED ORDER — SENNOSIDES-DOCUSATE SODIUM 8.6-50 MG PO TABS: 1.0000 | ORAL_TABLET | Freq: Every evening | ORAL | Status: DC | PRN

## 2023-03-24 MED ORDER — ISOSORBIDE DINITRATE 5 MG PO TABS
5.0000 mg | ORAL_TABLET | Freq: Two times a day (BID) | ORAL | Status: DC
  Filled 2023-03-24: qty 1

## 2023-03-24 MED ORDER — IRBESARTAN 300 MG PO TABS: 300.0000 mg | ORAL_TABLET | Freq: Every day | ORAL | Status: DC | PRN

## 2023-03-24 MED ORDER — ISOSORBIDE DINITRATE 10 MG PO TABS
10.0000 mg | ORAL_TABLET | Freq: Two times a day (BID) | ORAL | 2 refills | Status: DC
  Filled 2023-03-24: qty 60, 30d supply, fill #0

## 2023-03-24 MED ORDER — FAMOTIDINE 40 MG PO TABS: 40.0000 mg | ORAL_TABLET | Freq: Every day | ORAL | Status: DC

## 2023-03-24 NOTE — Progress Notes (Signed)
Discharge order in for home - AVS printed.  Will review with pt and spouse when spouse arrives.  Pt to receive TF teaching prior to discharge.  Also waiting on TF setup for home prior to discharge.

## 2023-03-24 NOTE — Progress Notes (Signed)
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 s/p 5/7 transposition of right SCA to right CCA with ligation of Kommerell Diverticulum, thoracic aortic endovascular stent graft, stent of left CIA and EIA. 5/15 s/p PEG tube placement by general surgery; MD will advance tube feeds as tolerated; Disposition pending patient obtaining adequate caloric intake. PMH: CAD, COPD, CKD, Dysphasgia, Essential HTN, Osteopenia.    PT Comments    Pt reports he is eager to discharge this afternoon. Wife is happy to see upright Rollator and how pt is able to maintain upright positioning. Pt requires reinforcement of binder use during session. Overall, pt able to mobilize at a min guard level. CM notified that pt would benefit from upright Rollator and she responded she would talk to pt and wife about coverage and where to find one.      Recommendations for follow up therapy are one component of a multi-disciplinary discharge planning process, led by the attending physician.  Recommendations may be updated based on patient status, additional functional criteria and insurance authorization.     Assistance Recommended at Discharge Frequent or constant Supervision/Assistance  Patient can return home with the following A little help with walking and/or transfers;A little help with bathing/dressing/bathroom;Assistance with cooking/housework;Assist for transportation;Help with stairs or ramp for entrance   Equipment Recommendations  Other (comment) (bilateral platform rollator; pt is aware rollator may not be covered)       Precautions / Restrictions Precautions Precautions: Fall Precaution Comments: now with PEG placed 5/15, needs abdominal binder on at all times and HOB >30* when tube feeds running; abd protection precs Restrictions Weight  Bearing Restrictions: No     Mobility  Bed Mobility Overal bed mobility: Needs Assistance Bed Mobility: Rolling, Sidelying to Sit Rolling: Supervision Sidelying to sit: Min guard, HOB elevated       General bed mobility comments: pt abdominal binder off and comes to seated before PT could retrieve it, reluctantly puts in place when EoB    Transfers Overall transfer level: Needs assistance Equipment used:  (upright Rollator) Transfers: Sit to/from Stand, Bed to chair/wheelchair/BSC Sit to Stand: Min guard           General transfer comment: min guard for power up to standing Rollator,    Ambulation/Gait Ambulation/Gait assistance: Min guard Gait Distance (Feet): 150 Feet Assistive device:  (stand up Rollator) Gait Pattern/deviations: Step-through pattern, Decreased step length - right, Decreased step length - left, Trunk flexed Gait velocity: slowed Gait velocity interpretation: <1.8 ft/sec, indicate of risk for recurrent falls   General Gait Details: min guard for safety, cues for upright posture and close proximity to RW when in tight spaces         Balance Overall balance assessment: Mild deficits observed, not formally tested                                          Cognition Arousal/Alertness: Awake/alert Behavior During Therapy: WFL for tasks assessed/performed Overall Cognitive Status: Within Functional Limits for tasks assessed                                 General Comments: HoH frustrated with need for abdominal binder and will not allow  it to be placed snuggly           General Comments General comments (skin integrity, edema, etc.): pt assisted in donning binder EoB, wife present and pleased to see him use upright walker      Pertinent Vitals/Pain Pain Assessment Pain Assessment: Faces Faces Pain Scale: Hurts a little bit Pain Location: abdominal PEG site Pain Descriptors / Indicators: Grimacing, Discomfort      PT Goals (current goals can now be found in the care plan section) Acute Rehab PT Goals Patient Stated Goal: To get enough nutrition and be able to go home PT Goal Formulation: With patient Time For Goal Achievement: 03/28/23 Potential to Achieve Goals: Good Progress towards PT goals: Progressing toward goals    Frequency    Min 1X/week      PT Plan Current plan remains appropriate       AM-PAC PT "6 Clicks" Mobility   Outcome Measure  Help needed turning from your back to your side while in a flat bed without using bedrails?: None Help needed moving from lying on your back to sitting on the side of a flat bed without using bedrails?: A Little (post-feeding tube placement, no rails) Help needed moving to and from a bed to a chair (including a wheelchair)?: A Little Help needed standing up from a chair using your arms (e.g., wheelchair or bedside chair)?: A Little Help needed to walk in hospital room?: A Little Help needed climbing 3-5 steps with a railing? : A Little 6 Click Score: 19    End of Session Equipment Utilized During Treatment: Other (comment);Gait belt (abd binder) Activity Tolerance: Patient tolerated treatment well Patient left: with call bell/phone within reach;in bed (sitting EoB SLP in room) Nurse Communication: Mobility status PT Visit Diagnosis: Muscle weakness (generalized) (M62.81);Difficulty in walking, not elsewhere classified (R26.2)     Time: 1610-9604 PT Time Calculation (min) (ACUTE ONLY): 13 min  Charges:  $Gait Training: 8-22 mins                     Brian Dunn PT, DPT Acute Rehabilitation Services Please use secure chat or  Call Office [phone]    Brian Dunn [facility] 03/24/2023, 5:17 PM

## 2023-03-24 NOTE — Progress Notes (Signed)
Rinaldo Cloud to come and do TF/PEG teaching with pt and spouse at 3pm.

## 2023-03-24 NOTE — Progress Notes (Signed)
SLP note:  Returned to speak with Brian Dunn re: plan for f/u SLP and dysphagia tx. Brian Dunn has appt at [facility] on 6/5. Provided her with my cell number in case she has questions post D/C.  Brian Wos L. Samson Frederic, MA CCC/SLP Clinical Specialist - Acute Care SLP Acute Rehabilitation Services Office number [phone]

## 2023-03-24 NOTE — Discharge Summary (Signed)
EVAR Discharge Summary   Brian Dunn 1941/10/02 82 y.o. male  MRN: [id number]  Admission Date: 03/11/2023  Discharge Date: 03/24/2023  Physician: Juventino Slovak*  Admission Diagnosis: Kommerell's diverticulum [Q25.49]   HPI:   This is a 82 y.o. male ***  Hospital Course:  The patient was admitted to the hospital and taken to the operating room on 03/19/2023 and underwent: ***    Findings: ***  The pt tolerated the procedure well and was transported to the PACU in {DESC; GOOD/FAIR/POOR:18685} condition.   By POD 1, ***   CBC    Component Value Date/Time   WBC 9.5 03/19/2023 0425   RBC 2.55 (L) 03/19/2023 0425   HGB 8.2 (L) 03/19/2023 0425   HGB 11.1 (L) 01/31/2022 0920   HCT 24.9 (L) 03/19/2023 0425   HCT 32.7 (L) 01/31/2022 0920   PLT 252 03/19/2023 0425   PLT 262 01/31/2022 0920   MCV 97.6 03/19/2023 0425   MCV 96 01/31/2022 0920   MCV 95 (A) 02/27/2021 0000   MCH 32.2 03/19/2023 0425   MCHC 32.9 03/19/2023 0425   RDW 13.2 03/19/2023 0425   RDW 11.8 01/31/2022 0920   LYMPHSABS 1.5 01/12/2020 1005   MONOABS 0.3 01/22/2007 0923   EOSABS 0.2 01/12/2020 1005   BASOSABS 0.0 01/12/2020 1005    BMET    Component Value Date/Time   NA 138 03/21/2023 0509   NA 136 02/07/2023 1007   K 4.1 03/21/2023 0509   CL 104 03/21/2023 0509   CO2 26 03/21/2023 0509   GLUCOSE 115 (H) 03/21/2023 0509   BUN 37 (H) 03/21/2023 0509   BUN 27 02/07/2023 1007   CREATININE 0.71 03/21/2023 0509   CREATININE 1.26 (H) 12/20/2022 0949   CALCIUM 8.4 (L) 03/21/2023 0509   GFRNONAA >60 03/21/2023 0509   GFRNONAA 57 (L) 12/20/2022 0949   GFRAA 76 11/07/2020 1442       Discharge Instructions     Call MD for:  redness, tenderness, or signs of infection (pain, swelling, bleeding, redness, odor or green/yellow discharge around incision site)   Complete by: As directed    Call MD for:  severe or increased pain, loss or decreased feeling  in affected limb(s)    Complete by: As directed    Call MD for:  temperature >100.5   Complete by: As directed    Discharge instructions   Complete by: As directed    Tube feeds: - 355 ml (1.5 cartons) Jevity 1.5 cal formula QID at 0800, 1200, 1600, and 2000 (total of 6 cartons daily) - Free water flushes of 60 ml before and after each bolus feed - Additional free water flushes of 100 ml TID   Driving Restrictions   Complete by: As directed    No driving until returning to see Dr. Randie Heinz.   Lifting restrictions   Complete by: As directed    No heavy lifting until returning to see Dr. Randie Heinz.   Resume previous diet   Complete by: As directed        Discharge Diagnosis:  Kommerell's diverticulum [Q25.49]  Secondary Diagnosis: Patient Active Problem List   Diagnosis Date Noted   Esophageal dysmotility 03/15/2023   Protein-calorie malnutrition, severe 03/14/2023   Kommerell's diverticulum 03/11/2023   Preop cardiovascular exam 02/26/2023   Aneurysm of right subclavian artery (HCC) 02/07/2023   Dysphagia 12/27/2022   Encounter for fitting and adjustment of hearing aid 01/29/2022   Chronic kidney disease 01/28/2022   Gout 01/28/2022  Sensorineural hearing loss, bilateral 01/28/2022   Chronic ischemic heart disease, unspecified 01/28/2022   Anemia in chronic kidney disease 02/27/2021   Allergic rhinitis    Hypertension    Angina, class III (HCC) 11/07/2020   Angina pectoris (HCC) 11/07/2020   COPD (chronic obstructive pulmonary disease) (HCC)    GERD (gastroesophageal reflux disease)    Hypogonadism male    Osteopenia    Essential hypertension 11/06/2017   Postoperative visit 08/14/2016   Bilateral inguinal hernia without obstruction or gangrene 06/20/2016   Coronary artery disease involving native coronary artery of native heart with angina pectoris (HCC) 06/20/2016   Hyperlipidemia with target LDL less than 70 06/20/2016   Benign essential hypertension 06/20/2016   CAD (coronary artery  disease), native coronary artery 06/20/2016   Hyperlipidemia 06/20/2016   Multiple lung nodules on CT 11/21/2015   Low back pain 09/09/2013   Asthma 06/18/2012   Acute bronchitis 05/29/2012   Cough 03/25/2012   Cough variant asthma 10/18/2010   HYPOGONADISM 02/18/2007   Tremor, essential 02/18/2007   GERD 01/22/2007   Disorder of bone and cartilage 01/22/2007   CHEST PAIN 01/22/2007   HIP FRACTURE 01/22/2007   HX, PERSONAL, MALIGNANCY, PROSTATE 01/22/2007   Hip fracture (HCC) 01/22/2007   Past Medical History:  Diagnosis Date   Acute bronchitis 05/29/2012   Allergic rhinitis    Anemia in chronic kidney disease 02/27/2021   Aneurysm of right subclavian artery (HCC) 02/07/2023   Angina pectoris (HCC) 11/07/2020   Angina, class III (HCC) 11/07/2020   Asthma 06/18/2012   Followed in Pulmonary clinic/ [facility]/ [place]  - 01/09/2018  After extensive coaching inhaler device  effectiveness =   75% > continue symb 160 2bid and return for pfts in 3 months ? RA bronchiolitis?  - PFT's  04/23/2018  FEV1 2.84 (91 % ) ratio 73  p no % improvement from saba p symb 160 prior to study with DLCO  76 % corrects to 79  % for alv volume   - 04/23/2018  After extensive coaching   Benign essential hypertension 06/20/2016   Bilateral inguinal hernia without obstruction or gangrene 06/20/2016   CAD (coronary artery disease), native coronary artery 06/20/2016   Chest pain, unspecified    Chronic ischemic heart disease, unspecified 01/28/2022   Chronic kidney disease 01/28/2022   COPD (chronic obstructive pulmonary disease) (HCC)    Coronary artery disease involving native coronary artery of native heart with angina pectoris (HCC) 06/20/2016   Cough 03/25/2012   Followed in Pulmonary clinic/ [facility]/ [place]    - Trial off acei again  03/25/2012    - Sinus CT 01/24/11 There is mucosal thickening within the paranasal sinuses without  air-fluid levels. Neither ostiomeatal unit is patent.    Cough  variant asthma 10/18/2010   Followed in Pulmonary clinic/ [facility]/ [place]  -HFA 75% p coaching 01/22/2011  -PFT's  02/04/2011 minimal airflow obst, nl dlco    Disorder of bone and cartilage 01/22/2007   Qualifier: Diagnosis of  By: Drue Novel MD, Nolon Rod.   Overview:  Overview:  Qualifier: Diagnosis of  By: Drue Novel MD, Jose E.   Dysphagia 12/27/2022   Onset ? Aug 2022 p febrile illness ? Asp pna   -  MBS 01/13/23   PO Diet Recommendation: Regular; Thin liquids (Level 0)  Liquid Administration via: Cup; Spoon; Straw  Supervision: Patient able to self-feed  Postural changes: -- (try reclining when sipping liquids)  Oral care recommendations: Oral care QID (4x/day); Oral  care before ice   chips/water  Recommended consults: Consider ENT consultation   Encounter for fitting and adjustment of hearing aid 01/29/2022   Essential hypertension 11/06/2017   GERD 01/22/2007   Annotation: history of esophageal stricture Qualifier: Diagnosis of  By: Drue Novel MD, Nolon Rod.    GERD (gastroesophageal reflux disease)    Gout 01/28/2022   HIP FRACTURE 01/22/2007   Annotation: right-sided status post surgery Qualifier: Diagnosis of  By: Drue Novel MD, Nolon Rod.    Hip fracture (HCC)    Hyperlipidemia 06/20/2016   Hyperlipidemia with target LDL less than 70 06/20/2016   Hypertension    HYPOGONADISM 02/18/2007   Qualifier: Diagnosis of  By: Drue Novel MD, Nolon Rod.    Hypogonadism male    Low back pain 09/09/2013   Multiple lung nodules on CT 11/21/2015   CT [place] 10/17/15 mpns > 15 y since quit smoking > rec 12 m f/u as this is low risk > done 06/04/16 no change rec recheck in 40m  - CT [place] 12/08/17 no change nodules > meets benign criteria > no directed f/u - Quantiferon GOLD TB 01/09/18 neg    Osteopenia    Personal history of malignant neoplasm of prostate    Postoperative visit 08/14/2016   Sensorineural hearing loss, bilateral 01/28/2022   Tremor, essential 02/18/2007   Overview:  Overview:  Qualifier: Diagnosis of  By: Drue Novel MD, Nolon Rod.  Last Assessment & Plan:  Reviewed tremor may increase on dulera so will need to be balanced against benefits to cough     Allergies as of 03/24/2023       Reactions   Metoprolol Other (See Comments)   Weight gain   Penicillins Swelling   Morphine Rash        Medication List     STOP taking these medications    ascorbic acid 500 MG tablet Commonly known as: VITAMIN C   aspirin EC 81 MG tablet Replaced by: aspirin 325 MG tablet   clopidogrel 75 MG tablet Commonly known as: PLAVIX   Co Q-10 75 MG Caps   ferrous sulfate 325 (65 FE) MG tablet   isosorbide mononitrate 30 MG 24 hr tablet Commonly known as: IMDUR   Magnesium 400 MG Caps   Oyster Shell Calcium/D 500-5 MG-MCG Tabs   pantoprazole 40 MG tablet Commonly known as: PROTONIX   vitamin B-12 500 MCG tablet Commonly known as: CYANOCOBALAMIN       TAKE these medications    albuterol 108 (90 Base) MCG/ACT inhaler Commonly known as: ProAir HFA Inhale 2 puffs into the lungs every 6 (six) hours as needed for wheezing.   allopurinol 100 MG tablet Commonly known as: ZYLOPRIM Take 1 tablet by mouth daily.   aspirin 325 MG tablet Place 1 tablet (325 mg total) into feeding tube daily. Start taking on: Mar 25, 2023 Replaces: aspirin EC 81 MG tablet   budesonide-formoterol 160-4.5 MCG/ACT inhaler Commonly known as: SYMBICORT Inhale 2 puffs into the lungs daily.   celecoxib 200 MG capsule Commonly known as: CELEBREX Place 1 capsule (200 mg total) into feeding tube daily. Start taking on: Mar 25, 2023 What changed: how to take this   docusate 50 MG/5ML liquid Commonly known as: COLACE Place 10 mLs (100 mg total) into feeding tube daily. Start taking on: Mar 25, 2023   famotidine 40 MG tablet Commonly known as: PEPCID Place 1 tablet (40 mg total) into feeding tube daily. Start taking on: Mar 25, 2023 What changed: how to  take this   fluticasone 50 MCG/ACT nasal spray Commonly known as:  FLONASE Place 2 sprays into the nose daily.   food thickener Gel Commonly known as: SIMPLYTHICK (NECTAR/LEVEL 2/MILDLY THICK) Take 1 packet by mouth as needed.   irbesartan 300 MG tablet Commonly known as: AVAPRO Place 1 tablet (300 mg total) into feeding tube daily as needed (blood pressure). What changed: how to take this   isosorbide dinitrate 10 MG tablet Commonly known as: ISORDIL Place 1 tablet (10 mg total) into feeding tube 2 (two) times daily with a meal.   loratadine 10 MG tablet Commonly known as: CLARITIN Place 1 tablet (10 mg total) into feeding tube daily. What changed: how to take this   multivitamin capsule Place 1 capsule into feeding tube daily. What changed: how to take this   nitroGLYCERIN 0.4 MG SL tablet Commonly known as: NITROSTAT Place 1 tablet (0.4 mg total) under the tongue every 5 (five) minutes as needed for chest pain.   pravastatin 40 MG tablet Commonly known as: PRAVACHOL Place 1 tablet (40 mg total) into feeding tube daily. Start taking on: Mar 25, 2023 What changed: how to take this   primidone 50 MG tablet Commonly known as: MYSOLINE Place 1 tablet (50 mg total) into feeding tube at bedtime. What changed: how to take this   senna-docusate 8.6-50 MG tablet Commonly known as: Senokot-S Place 1 tablet into feeding tube at bedtime as needed for mild constipation.   tamsulosin 0.4 MG Caps capsule Commonly known as: FLOMAX Take 1 capsule (0.4 mg total) by mouth daily. Open capsule and put contents into tube. What changed: additional instructions   traMADol 50 MG tablet Commonly known as: ULTRAM Place 1 tablet (50 mg total) into feeding tube every 6 (six) hours as needed for moderate pain. What changed:  how to take this when to take this               Durable Medical Equipment  (From admission, onward)           Start     Ordered   03/24/23 1031  For home use only DME Tube feeding  Once       Comments: Tube feeding  recommendations for home: - 355 ml (1.5 cartons) Jevity 1.5 cal formula QID at 0800, 1200, 1600, and 2000 (total of 6 cartons daily) - Free water flushes of 60 ml before and after each bolus feed KH - Additional free water flushes of 100 ml TID   Anticipate long term need   03/24/23 1031            Discharge Instructions:  [facility]  Discharge Instructions Endovascular Aortic Aneurysm Repair  Please refer to the following instructions for your post-procedure care. Your surgeon or Physician Assistant will discuss any changes with you.  Activity  You are encouraged to walk as much as you can. You can slowly return to normal activities but must avoid strenuous activity and heavy lifting until your doctor tells you it's OK. Avoid activities such as vacuuming or swinging a gold club. It is normal to feel tired for several weeks after your surgery. Do not drive until your doctor gives the OK and you are no longer taking prescription pain medications. It is also normal to have difficulty with sleep habits, eating, and bowel movements after surgery. These will go away with time.  Bathing/Showering  You may shower after you go home. If you have an incision,  do not soak in a bathtub, hot tub, or swim until the incision heals completely.  Incision Care  Shower every day. Clean your incision with mild soap and water. Pat the area dry with a clean towel. You do not need a bandage unless otherwise instructed. Do not apply any ointments or creams to your incision. If you clothing is irritating, you may cover your incision with a dry gauze pad.  Diet  Tube feeds: - 355 ml (1.5 cartons) Jevity 1.5 cal formula QID at 0800, 1200, 1600, and 2000 (total of 6 cartons daily) - Free water flushes of 60 ml before and after each bolus feed - Additional free water flushes of 100 ml TID   -Add Simply Thick packet to 4 ounces of liquid for Nectar/Level 2/Mildly  Thick  Medications  Medications per tube    Follow up  Our office will schedule a follow-up appointment with Dr. Randie Heinz in 3-4 weeks after your surgery.  Please call us immediately for any of the following conditions  Severe or worsening pain in your legs or feet or in your abdomen back or chest. Increased pain, redness, drainage (pus) from your incision sit. Increased abdominal pain, bloating, nausea, vomiting or persistent diarrhea. Fever of 101 degrees or higher. Swelling in your leg (s),  Reduce your risk of vascular disease  Stop smoking. If you would like help call [facility] at 1-800-QUIT-NOW (606 092 7064) or [place] at [phone]. Manage your cholesterol Maintain a desired weight Control your diabetes Keep your blood pressure down  If you have questions, please call the office at [phone].   Prescriptions given: 1. Asa 325mg  per tube daily 2.  Colace 50mg /10ml - 10ml per tube qd with 3 refills 3.  Simply Thick packet-Add packet to 4 ounces of liquid for Nectar/Level 2/Mildly Thick  OTC 4.  Isordil 10mg  bid per tube #60 with 2 refills (in place of long acting IMDUR since this cannot be crushed for tube) 5. Senokot-S per tube qhs OTC  Other meds will be able to be crushed and placed per tube.   Disposition: home with [facility]  Patient's condition: is Good  Follow up: 1. Dr. Randie Heinz in 4 weeks    Doreatha Massed, PA-C [facility] [phone] 03/24/2023  12:05 PM   - For VQI Registry use - Post-op:  Time to Extubation: [***] In OR, [ ]  < 12 hrs, [ ]  12-24 hrs, [ ]  >=24 hrs Vasopressors Req. Post-op: {yes/no:20286} MI: {yes no:314532}, [ ]  Troponin only, [ ]  EKG or Clinical New Arrhythmia: {yes/no:20286} CHF: {yes/no:20286} ICU Stay: *** day in *** Transfusion: {yes/no:20286}     If yes, *** units given  Complications: Resp failure: {yes no:314532}, [ ]  Pneumonia, [ ]  Ventilator Chg in renal function: {yes no:314532}, [ ]   Inc. Cr > 0.5, [ ]  Temp. Dialysis,  [ ]  Permanent dialysis Leg ischemia: {yes no:314532}, no Surgery needed, [ ]  Yes, Surgery needed,  [ ]  Amputation Bowel ischemia: {yes no:314532}, [ ]  Medical Rx, [ ]  Surgical Rx Wound complication: {yes no:314532}, [ ]  Superficial separation/infection, [ ]  Return to OR Return to OR: {yes/no:20286}  Return to OR for bleeding: {yes/no:20286} Stroke: {yes no:314532}, [ ]  Minor, [ ]  Major  Discharge medications: Statin use:  {yes/no:20286}  ASA use:  {yes/no:20286}  Plavix use:  {yes/no:20286}  Beta blocker use:  {yes/no:20286}  ARB use:  {yes/no:20286} ACEI use:  {yes/no:20286} CCB use:  {yes/no:20286} ***

## 2023-03-24 NOTE — Progress Notes (Signed)
Nutrition Follow-up  DOCUMENTATION CODES:   Severe malnutrition in context of chronic illness  INTERVENTION:   Transition to bolus tube feeding regimen via PEG: - Goal of 1.5 cartons (355 ml) of Jevity 1.5 cal formula QID at 0800, 1200, 1600, and 2000 (total of 6 cartons daily)  First bolus: 1 carton (237 ml)  Second bolus and goal: 1.5 cartons (355 ml) - Free water flush of 60 ml before and 60 ml after each bolus feed - Additional free water flushes of 100 ml TID  Bolus tube feeding regimen with free water flushes provides 2130 kcal, 90.6 grams of protein, and 1860 ml of H2O.  NUTRITION DIAGNOSIS:   Severe Malnutrition related to chronic illness (COPD, also suspect concerns over difficulty swallowing impacting intake) as evidenced by severe fat depletion, severe muscle depletion.  Ongoing, being addressed via TF  GOAL:   Patient will meet greater than or equal to 90% of their needs  Met via TF  MONITOR:   Labs, Weight trends, TF tolerance, I & O's  REASON FOR ASSESSMENT:   Malnutrition Screening Tool    ASSESSMENT:   82 year old male with PMHx of COPD, GERD, HTN, CAD now who presented for surgery with diagnosis of Kommerell's diverticulum s/p transposition of right SCA to right CCA with ligation of Kommerell Diverticulum, thoracic aortic endovascular stent graft, stent of left CIA and EIA on 03/11/23.  05/10 - s/p MBS with recommendations for NPO, Cortrak placement unsuccessful 05/11 - placement of NG tube under fluoro unsuccessful 05/13 - TPN consult came in after hours so unable to be started 05/14 - TPN start @ 30 ml/hr (goal rate 75 ml/hr) 05/15 - s/p PEG tube placement by Surgery, TF started 4 hours after PEG @ 10 ml/hr, TPN still at 30 ml/hr 05/16 - TPN increased to 45 ml/hr, TF increased to 20 ml/hr then later to 30 ml/hr 05/17 - TPN still at 45 ml/hr, TF increased to 40 ml/hr 05/18 - TPN still at 45 ml/hr, TPN stopped at 1800, TF increased to goal rate of 55  ml/hr  Discussed pt with PA and RN. Plan to transition to bolus tube feeding regimen in anticipation of discharge home.  Pt is unable to consume enough POs to meet his nutritional needs. He is only able to take small sips of nectar-thick liquids. Pt requires tube feeds via PEG to meet nutritional needs.  Spoke with pt at bedside. He reports tolerating his first bolus feed with a slight amount of nausea. Per RN, he has had nausea at baseline. Pt shares that his wife is on her way. Plan is for discharge teaching to occur today.  Pt with non-pitting edema to BUE and BLE.  Admit weight: 59.8 kg Current weight: 60.8 kg  Current TF: Jevity 1.5 @ 55 ml/hr, PROSource TF20 60 ml daily  Medications reviewed and include: colace, pepcid, SSI q 4 hours, novolog 2 units q 4 hours, IV protonix  Labs from 5/17 reviewed. No new BMP or CMP since that date. CBG's: 95-138 x 24 hours  Diet Order:   Diet Order             Diet NPO time specified Except for: Ice Chips  Diet effective now                   EDUCATION NEEDS:   Education needs have been addressed  Skin:  Skin Assessment: Skin Integrity Issues: Incisions: closed incisions to right chest, groin, abd  Last BM:  03/23/23 large type 4  Height:   Ht Readings from Last 1 Encounters:  03/11/23 5\' 9"  (1.753 m)    Weight:   Wt Readings from Last 1 Encounters:  03/24/23 60.8 kg    Ideal Body Weight:  72.7 kg  BMI:  Body mass index is 19.79 kg/m.  Estimated Nutritional Needs:   Kcal:  1850-2100 kcal  Protein:  85-95 grams  Fluid:  >1.8 L    Mertie Clause, MS, RD, LDN Inpatient Clinical Dietitian Please see AMiON for contact information.

## 2023-03-24 NOTE — Progress Notes (Addendum)
Progress Note    03/24/2023 6:52 AM   Subjective:  says he is getting out of here today! No complaints.  Says he was out of bed yesterday.   Afebrile HR 70's-80's  100's-110's systolic   Vitals:   03/24/23 2956 03/24/23 0404  BP: (!) 112/57 (!) 115/52  Pulse:  79  Resp: 20 20  Temp: 97.7 F (36.5 C) 98 F (36.7 C)  SpO2:      Physical Exam: General:  no distress Lungs:  non labored Incisions:  bilateral groins are soft and right chest incision looks good.  Extremities:  bilateral feet are warm and well perfused.  Abdomen:  soft, NT; PEG tube in place  CBC    Component Value Date/Time   WBC 9.5 03/19/2023 0425   RBC 2.55 (L) 03/19/2023 0425   HGB 8.2 (L) 03/19/2023 0425   HGB 11.1 (L) 01/31/2022 0920   HCT 24.9 (L) 03/19/2023 0425   HCT 32.7 (L) 01/31/2022 0920   PLT 252 03/19/2023 0425   PLT 262 01/31/2022 0920   MCV 97.6 03/19/2023 0425   MCV 96 01/31/2022 0920   MCV 95 (A) 02/27/2021 0000   MCH 32.2 03/19/2023 0425   MCHC 32.9 03/19/2023 0425   RDW 13.2 03/19/2023 0425   RDW 11.8 01/31/2022 0920   LYMPHSABS 1.5 01/12/2020 1005   MONOABS 0.3 01/22/2007 0923   EOSABS 0.2 01/12/2020 1005   BASOSABS 0.0 01/12/2020 1005    BMET    Component Value Date/Time   NA 138 03/21/2023 0509   NA 136 02/07/2023 1007   K 4.1 03/21/2023 0509   CL 104 03/21/2023 0509   CO2 26 03/21/2023 0509   GLUCOSE 115 (H) 03/21/2023 0509   BUN 37 (H) 03/21/2023 0509   BUN 27 02/07/2023 1007   CREATININE 0.71 03/21/2023 0509   CREATININE 1.26 (H) 12/20/2022 0949   CALCIUM 8.4 (L) 03/21/2023 0509   GFRNONAA >60 03/21/2023 0509   GFRNONAA 57 (L) 12/20/2022 0949   GFRAA 76 11/07/2020 1442    INR    Component Value Date/Time   INR 1.2 03/11/2023 1742        Assessment/Plan:  82 y.o. male is s/p:  Transposition of right SCA to right CCA with ligation of Kommerell Diverticulum, thoracic aortic endovascular stent graft, stent of left CIA and EIA 03/11/2023 Dr.  Randie Heinz And esophagogastroduodenoscopy (EGD) and percutaneous endoscopic gastrostomy (PEG) tube placement 03/19/2023 Dr. Bedelia Person     -pt doing well this morning.  His groins are soft and right chest incision is healing.  He is tolerating his TF and what he is able to take in by mouth he is doing well with.   -there is no I&O documented for the past 24 hrs.  Will d/w RN.  -plan to discharge home today.  Will await recommendations about TF.   -DVT prophylaxis:  sq heparin -pt will f/u in our office in a couple of weeks with Dr. Randie Heinz.     Doreatha Massed, PA-C [facility] [phone] 03/24/2023 6:52 AM   I have independently interviewed and examined patient and agree with PA assessment and plan above.  Right neck incision without hematoma and right radial artery is strongly palpable.  He has been able to tolerate thickened coffee this weekend without any problems and he is tolerating tube feeds at goal.  Plan to discharge on aspirin holding Plavix given his postoperative hematoma and should not need Plavix for thoracic endograft placement.  He has follow-up with  ENT for swallow eval due to oropharyngeal dysphagia and if he has persistent issues he can consider reevaluation with GI for esophageal issues at the level Kommerell diverticulum although during placement of PEG tube there was no mention of esophageal stenosis.  Overall patient much improved with tube feeds and will have him follow-up in a couple weeks to track his progress.  Estle Huguley C. Randie Heinz, MD [facility] [place] Office: [phone] Pager: [phone]

## 2023-03-24 NOTE — Progress Notes (Signed)
TF at goal rate and pt tolerating well - no N/V.

## 2023-03-24 NOTE — Progress Notes (Signed)
Speech Language Pathology Treatment: Dysphagia  Patient Details Name: Brian Dunn MRN: [id number] DOB: 12/01/1940 Today's Date: 03/24/2023 Time: 4098-1191 SLP Time Calculation (min) (ACUTE ONLY): 13 min  Assessment / Plan / Recommendation Clinical Impression  Brian Dunn participated in swallowing tx.  He states that he was able to have some sips of nectar thick liquid this weekend. Today he self-fed 3-4 small sips of nectar-thick liquid; he demonstrated increased throat-clearing after each swallow, potentially indicating airway intrusion (based on results of prior MBS). Encouraged him to take his time and to use effortful swallow with each liquid bolus.  He fatigued after a few sips. His pharyngoesophageal dysphagia persists, and while he is beginning to show some improvement, he can not yet consume enough POs to meet his nutritional needs. It will likely be weeks to months before this is a possibility.  He will need the support of TF while his swallow is rehabilitated. In the interim, he may have small sips of nectar thick liquid throughout the day for pleasure.  He is working on the [name] and effortful swallow for pharyngeal/base-of-tongue strengthening and was able to demonstrate today with only initial cues necessary.  Recommend HH SLP to address swallowing; f/u with appt at [facility]; repeat MBS either as an OP at [facility] or through [facility].      HPI HPI: Pt is an 82 y/o male who presented for surgery, with the diagnosis of Kommerell's diverticulum. Pt s/p transposition of right SCA to right CCA with ligation of Kommerell Diverticulum, thoracic aortic endovascular stent graft, stent of left CIA and EIA 5/7.  G-tube 5/15. PMH: vascular disease and CAD. MBS 01/13/23: lordosis of his cervical spine, thinning of the prevertebral space, and what appears to be hypertrophy in the mid-superior pharynx (presents similarly to CP bar but  located anatomically higher). Poor base-of-tongue to pharyngeal wall contact and the epiglottis is unable to invert past its horizontal position. There is good laryngeal elevation; the arytenoids never fully meet the epiglottic petiole, minimal distension of the PES. Factors led to significant residue that accumulates in the pharynx, regardess of consistency, and is aspirated either during or after the swallow response. A regular texture diet with thin liquids was recommended with observance of swallowing precautions considering the severity of symptoms across consistencies. ENT referral at [facility] and outpatient SLP services recommended. Per Dr. Randie Heinz,  CT scan appeared to show the diverticulum to be compressing the esophagus against the trachea.  He discussed with the patient and his family at bedside that this is very likely at least a contributing factor to his dysphagia given the large size of the diverticulum relative to the small area where it existed.      SLP Plan  Continue with current plan of care      Recommendations for follow up therapy are one component of a multi-disciplinary discharge planning process, led by the attending physician.  Recommendations may be updated based on patient status, additional functional criteria and insurance authorization.    Recommendations  Diet recommendations:  (small sips of nectar thick liquid) Liquids provided via: Cup Medication Administration: Via alternative means Supervision: Patient able to self feed Compensations: Small sips/bites Postural Changes and/or Swallow Maneuvers: Seated upright 90 degrees                  Oral care BID   Intermittent Supervision/Assistance Dysphagia, pharyngoesophageal phase (R13.14)     Continue with current plan of care  Brian Dunn L. Samson Frederic, MA CCC/SLP Clinical Specialist - Acute Care SLP Acute Rehabilitation Services Office number [phone]  [name]  03/24/2023, 11:39 AM

## 2023-03-24 NOTE — Progress Notes (Signed)
Discharge instructions given. Patient verbalized understanding and all questions were answered.  ?

## 2023-03-24 NOTE — TOC Transition Note (Signed)
Transition of Care (TOC) - CM/SW Discharge Note Donn Pierini RN, BSN Transitions of Care Unit 4E- RN Case Manager See Treatment Team for direct phone #   Patient Details  Name: Brian Dunn MRN: [id number] Date of Birth: Jan 07, 1941  Transition of Care [facility]) CM/SW Contact:  Darrold Span, RN Phone Number: 03/24/2023, 4:07 PM   Clinical Narrative:    Pt stable for transition home today  Transition needs for Home TF have been coordinated with [facility] home infusion- liaison- Pam to come to room to educate wife prior to discharge- around 330pm this afternoon.  TF orders and SMN for [facility] have been provided- SMN signed and faxed back to [facility].   CM received msg from [place] liaison that they will not be able to staff for RN/SLP- will need to find new [facility] agency- Call made to [facility] with [facility]- they can accept referral for RN/SLP- with anticipated [facility] for tomorrow. Msg sent for orders as [facility] was under office protocol.   CM spoke with wife and pt at bedside and updated printed AVS with new HH information- both agreeable to [facility] change due to staffing. Also spoke with wife regarding elevated platform rollator that PT recommending. Wife will need to f/u and get one as no provider has once available that services the hospital- showed wife on [place] options for the platform rollator. Wife to f/u and order recommended DME.   No further TOC needs noted. Meds have been delivered from [facility] pharmacy- wife to transport home.    Final next level of care: Home w Home Health Services Barriers to Discharge: Barriers Resolved   Patient Goals and CMS Choice CMS Medicare.gov Compare Post Acute Care list provided to:: Patient Choice offered to / list presented to : Patient  Discharge Placement                 Home w/ [facility]        Discharge Plan and Services Additional resources added to the After Visit Summary for     Discharge Planning Services: CM Consult Post Acute Care  Choice: Durable Medical Equipment, Home Health          DME Arranged: Walker rolling with seat DME Agency: Other - Comment (pt will have to purchse on own)       HH Arranged: RN, Speech Therapy HH Agency: [facility] Date [facility] Agency Contacted: 03/24/23 Time HH Agency Contacted: 1523 Representative spoke with at [facility] Agency: Tresa Endo  Social Determinants of Health (SDOH) Interventions SDOH Screenings   Food Insecurity: No Food Insecurity (03/19/2023)  Housing: Low Risk  (03/19/2023)  Transportation Needs: No Transportation Needs (03/19/2023)  Utilities: Not At Risk (03/19/2023)  Tobacco Use: Medium Risk (03/20/2023)     Readmission Risk Interventions    03/24/2023    3:23 PM  Readmission Risk Prevention Plan  Post Dischage Appt Complete  Medication Screening Complete  Transportation Screening Complete

## 2023-03-24 NOTE — Discharge Instructions (Addendum)
  [facility]   Discharge Instructions  Endovascular Aortic Aneurysm Repair  Please refer to the following instructions for your post-procedure care. Your surgeon or Physician Assistant will discuss any changes with you.  Activity  You are encouraged to walk as much as you can. You can slowly return to normal activities but must avoid strenuous activity and heavy lifting until your doctor tells you it's OK. Avoid activities such as vacuuming or swinging a gold club. It is normal to feel tired for several weeks after your surgery. Do not drive until your doctor gives the OK and you are no longer taking prescription pain medications. It is also normal to have difficulty with sleep habits, eating, and bowel movements after surgery. These will go away with time.  Bathing/Showering  Shower daily after you go home.  Do not soak in a bathtub, hot tub, or swim until the incision heals completely.  If you have incisions in your groin, wash the groin wounds with soap and water daily and pat dry. (No tub bath-only shower)  Then put a dry gauze or washcloth there to keep this area dry to help prevent wound infection daily and as needed.  Do not use Vaseline or neosporin on your incisions.  Only use soap and water on your incisions and then protect and keep dry.  Incision Care  Shower every day. Clean your incision with mild soap and water. Pat the area dry with a clean towel. You do not need a bandage unless otherwise instructed. Do not apply any ointments or creams to your incision. If you clothing is irritating, you may cover your incision with a dry gauze pad.  Diet  Tube feeds: - 355 ml (1.5 cartons) Jevity 1.5 cal formula QID at 0800, 1200, 1600, and 2000 (total of 6 cartons daily) - Free water flushes of 60 ml before and after each bolus feed - Additional free water flushes of 100 ml TID  -Add Simply Thick packet to 4 ounces of liquid for Nectar/Level 2/Mildly  Thick   Medications  Medications per tube.     Follow up  Our office will schedule a follow-up appointment with Dr. Randie Heinz in 3-4 weeks   Please call us immediately for any of the following conditions  Severe or worsening pain in your legs or feet or in your abdomen back or chest. Increased pain, redness, drainage (pus) from your incision site. Increased abdominal pain, bloating, nausea, vomiting or persistent diarrhea. Fever of 101 degrees or higher. Swelling in your leg (s),  Reduce your risk of vascular disease  Stop smoking. If you would like help call [facility] at 1-800-QUIT-NOW (925-542-5587) or [facility] at [phone]. Manage your cholesterol Maintain a desired weight Control your diabetes Keep your blood pressure down  If you have questions, please call the office at [phone].

## 2023-03-24 NOTE — Care Management Important Message (Signed)
Important Message  Patient Details  Name: Brian Dunn MRN: [id number] Date of Birth: 06-05-1941   Medicare Important Message Given:  Yes     Renie Ora 03/24/2023, 11:43 AM

## 2023-03-25 ENCOUNTER — Other Ambulatory Visit (HOSPITAL_COMMUNITY): Payer: Self-pay

## 2023-03-25 DIAGNOSIS — I2510 Atherosclerotic heart disease of native coronary artery without angina pectoris: Secondary | ICD-10-CM | POA: Diagnosis not present

## 2023-03-25 DIAGNOSIS — G250 Essential tremor: Secondary | ICD-10-CM | POA: Diagnosis not present

## 2023-03-25 DIAGNOSIS — I129 Hypertensive chronic kidney disease with stage 1 through stage 4 chronic kidney disease, or unspecified chronic kidney disease: Secondary | ICD-10-CM | POA: Diagnosis not present

## 2023-03-25 DIAGNOSIS — H903 Sensorineural hearing loss, bilateral: Secondary | ICD-10-CM | POA: Diagnosis not present

## 2023-03-25 DIAGNOSIS — Z48812 Encounter for surgical aftercare following surgery on the circulatory system: Secondary | ICD-10-CM | POA: Diagnosis not present

## 2023-03-25 DIAGNOSIS — Z8701 Personal history of pneumonia (recurrent): Secondary | ICD-10-CM | POA: Diagnosis not present

## 2023-03-25 DIAGNOSIS — N189 Chronic kidney disease, unspecified: Secondary | ICD-10-CM | POA: Diagnosis not present

## 2023-03-25 DIAGNOSIS — R1312 Dysphagia, oropharyngeal phase: Secondary | ICD-10-CM | POA: Diagnosis not present

## 2023-03-25 DIAGNOSIS — Q2549 Other congenital malformations of aorta: Secondary | ICD-10-CM | POA: Diagnosis not present

## 2023-03-25 DIAGNOSIS — M109 Gout, unspecified: Secondary | ICD-10-CM | POA: Diagnosis not present

## 2023-03-25 DIAGNOSIS — Z431 Encounter for attention to gastrostomy: Secondary | ICD-10-CM | POA: Diagnosis not present

## 2023-03-25 DIAGNOSIS — E43 Unspecified severe protein-calorie malnutrition: Secondary | ICD-10-CM | POA: Diagnosis not present

## 2023-03-25 DIAGNOSIS — D631 Anemia in chronic kidney disease: Secondary | ICD-10-CM | POA: Diagnosis not present

## 2023-03-25 DIAGNOSIS — Z79899 Other long term (current) drug therapy: Secondary | ICD-10-CM | POA: Diagnosis not present

## 2023-03-25 DIAGNOSIS — Z681 Body mass index (BMI) 19 or less, adult: Secondary | ICD-10-CM | POA: Diagnosis not present

## 2023-03-25 DIAGNOSIS — R1310 Dysphagia, unspecified: Secondary | ICD-10-CM | POA: Diagnosis not present

## 2023-03-25 DIAGNOSIS — R911 Solitary pulmonary nodule: Secondary | ICD-10-CM | POA: Diagnosis not present

## 2023-03-25 DIAGNOSIS — M8580 Other specified disorders of bone density and structure, unspecified site: Secondary | ICD-10-CM | POA: Diagnosis not present

## 2023-03-25 DIAGNOSIS — J4489 Other specified chronic obstructive pulmonary disease: Secondary | ICD-10-CM | POA: Diagnosis not present

## 2023-03-25 DIAGNOSIS — K5900 Constipation, unspecified: Secondary | ICD-10-CM | POA: Diagnosis not present

## 2023-03-25 DIAGNOSIS — Z955 Presence of coronary angioplasty implant and graft: Secondary | ICD-10-CM | POA: Diagnosis not present

## 2023-03-25 DIAGNOSIS — K219 Gastro-esophageal reflux disease without esophagitis: Secondary | ICD-10-CM | POA: Diagnosis not present

## 2023-03-25 DIAGNOSIS — Z7951 Long term (current) use of inhaled steroids: Secondary | ICD-10-CM | POA: Diagnosis not present

## 2023-03-25 DIAGNOSIS — E785 Hyperlipidemia, unspecified: Secondary | ICD-10-CM | POA: Diagnosis not present

## 2023-03-25 DIAGNOSIS — Z79891 Long term (current) use of opiate analgesic: Secondary | ICD-10-CM | POA: Diagnosis not present

## 2023-03-25 DIAGNOSIS — Z7982 Long term (current) use of aspirin: Secondary | ICD-10-CM | POA: Diagnosis not present

## 2023-03-26 ENCOUNTER — Encounter: Payer: Self-pay | Admitting: Internal Medicine

## 2023-03-26 ENCOUNTER — Emergency Department (HOSPITAL_COMMUNITY): Payer: Medicare HMO

## 2023-03-26 ENCOUNTER — Telehealth: Payer: Self-pay | Admitting: Internal Medicine

## 2023-03-26 ENCOUNTER — Other Ambulatory Visit: Payer: Self-pay

## 2023-03-26 ENCOUNTER — Observation Stay (HOSPITAL_COMMUNITY)
Admission: EM | Admit: 2023-03-26 | Discharge: 2023-03-28 | Disposition: A | Discharge status: Home / Self Care | Payer: Medicare HMO | Attending: Internal Medicine | Admitting: Internal Medicine

## 2023-03-26 ENCOUNTER — Encounter (HOSPITAL_COMMUNITY): Payer: Self-pay | Admitting: Vascular Surgery

## 2023-03-26 DIAGNOSIS — Z87891 Personal history of nicotine dependence: Secondary | ICD-10-CM | POA: Diagnosis not present

## 2023-03-26 DIAGNOSIS — Q2549 Other congenital malformations of aorta: Secondary | ICD-10-CM

## 2023-03-26 DIAGNOSIS — J9811 Atelectasis: Secondary | ICD-10-CM | POA: Diagnosis not present

## 2023-03-26 DIAGNOSIS — I2510 Atherosclerotic heart disease of native coronary artery without angina pectoris: Secondary | ICD-10-CM | POA: Diagnosis not present

## 2023-03-26 DIAGNOSIS — N189 Chronic kidney disease, unspecified: Secondary | ICD-10-CM | POA: Diagnosis present

## 2023-03-26 DIAGNOSIS — R1310 Dysphagia, unspecified: Secondary | ICD-10-CM | POA: Diagnosis not present

## 2023-03-26 DIAGNOSIS — I1310 Hypertensive heart and chronic kidney disease without heart failure, with stage 1 through stage 4 chronic kidney disease, or unspecified chronic kidney disease: Secondary | ICD-10-CM | POA: Insufficient documentation

## 2023-03-26 DIAGNOSIS — J449 Chronic obstructive pulmonary disease, unspecified: Secondary | ICD-10-CM | POA: Insufficient documentation

## 2023-03-26 DIAGNOSIS — R1084 Generalized abdominal pain: Secondary | ICD-10-CM | POA: Diagnosis not present

## 2023-03-26 DIAGNOSIS — Z7982 Long term (current) use of aspirin: Secondary | ICD-10-CM | POA: Diagnosis not present

## 2023-03-26 DIAGNOSIS — Z8546 Personal history of malignant neoplasm of prostate: Secondary | ICD-10-CM | POA: Insufficient documentation

## 2023-03-26 DIAGNOSIS — J45909 Unspecified asthma, uncomplicated: Secondary | ICD-10-CM | POA: Insufficient documentation

## 2023-03-26 DIAGNOSIS — D631 Anemia in chronic kidney disease: Secondary | ICD-10-CM | POA: Diagnosis present

## 2023-03-26 DIAGNOSIS — Z743 Need for continuous supervision: Secondary | ICD-10-CM | POA: Diagnosis not present

## 2023-03-26 DIAGNOSIS — Z79899 Other long term (current) drug therapy: Secondary | ICD-10-CM | POA: Insufficient documentation

## 2023-03-26 DIAGNOSIS — R55 Syncope and collapse: Secondary | ICD-10-CM | POA: Diagnosis not present

## 2023-03-26 DIAGNOSIS — D649 Anemia, unspecified: Secondary | ICD-10-CM | POA: Diagnosis not present

## 2023-03-26 DIAGNOSIS — I10 Essential (primary) hypertension: Secondary | ICD-10-CM | POA: Diagnosis present

## 2023-03-26 DIAGNOSIS — Z955 Presence of coronary angioplasty implant and graft: Secondary | ICD-10-CM | POA: Insufficient documentation

## 2023-03-26 DIAGNOSIS — E785 Hyperlipidemia, unspecified: Secondary | ICD-10-CM | POA: Diagnosis present

## 2023-03-26 LAB — CBC WITH DIFFERENTIAL/PLATELET
Abs Immature Granulocytes: 0.09 10*3/uL — ABNORMAL HIGH (ref 0.00–0.07)
Basophils Absolute: 0 10*3/uL (ref 0.0–0.1)
Basophils Relative: 0 %
Eosinophils Absolute: 0.2 10*3/uL (ref 0.0–0.5)
Eosinophils Relative: 2 %
HCT: 26.8 % — ABNORMAL LOW (ref 39.0–52.0)
Hemoglobin: 8.4 g/dL — ABNORMAL LOW (ref 13.0–17.0)
Immature Granulocytes: 1 %
Lymphocytes Relative: 11 %
Lymphs Abs: 1.2 10*3/uL (ref 0.7–4.0)
MCH: 31.6 pg (ref 26.0–34.0)
MCHC: 31.3 g/dL (ref 30.0–36.0)
MCV: 100.8 fL — ABNORMAL HIGH (ref 80.0–100.0)
Monocytes Absolute: 1.1 10*3/uL — ABNORMAL HIGH (ref 0.1–1.0)
Monocytes Relative: 11 %
Neutro Abs: 7.7 10*3/uL (ref 1.7–7.7)
Neutrophils Relative %: 75 %
Platelets: 428 10*3/uL — ABNORMAL HIGH (ref 150–400)
RBC: 2.66 MIL/uL — ABNORMAL LOW (ref 4.22–5.81)
RDW: 13.8 % (ref 11.5–15.5)
WBC: 10.3 10*3/uL (ref 4.0–10.5)
nRBC: 0 % (ref 0.0–0.2)

## 2023-03-26 LAB — COMPREHENSIVE METABOLIC PANEL
ALT: 20 U/L (ref 0–44)
AST: 20 U/L (ref 15–41)
Albumin: 2.6 g/dL — ABNORMAL LOW (ref 3.5–5.0)
Alkaline Phosphatase: 96 U/L (ref 38–126)
Anion gap: 8 (ref 5–15)
BUN: 34 mg/dL — ABNORMAL HIGH (ref 8–23)
CO2: 25 mmol/L (ref 22–32)
Calcium: 8.2 mg/dL — ABNORMAL LOW (ref 8.9–10.3)
Chloride: 100 mmol/L (ref 98–111)
Creatinine, Ser: 0.8 mg/dL (ref 0.61–1.24)
GFR, Estimated: >60 mL/min (ref >60)
Glucose, Bld: 106 mg/dL — ABNORMAL HIGH (ref 70–99)
Potassium: 4.2 mmol/L (ref 3.5–5.1)
Sodium: 133 mmol/L — ABNORMAL LOW (ref 135–145)
Total Bilirubin: 0.4 mg/dL (ref 0.3–1.2)
Total Protein: 5.8 g/dL — ABNORMAL LOW (ref 6.5–8.1)

## 2023-03-26 LAB — CBG MONITORING, ED: Glucose-Capillary: 104 mg/dL — ABNORMAL HIGH (ref 70–99)

## 2023-03-26 LAB — TROPONIN I (HIGH SENSITIVITY): Troponin I (High Sensitivity): 4 ng/L (ref <18)

## 2023-03-26 NOTE — ED Notes (Signed)
 via bladder scan

## 2023-03-26 NOTE — ED Provider Notes (Signed)
[place] EMERGENCY DEPARTMENT AT [facility] Provider Note   CSN: [id number] Arrival date & time: 03/26/23  2146     History  Chief Complaint  Patient presents with   Loss of Consciousness    Patient to ED via EMS with complaint of syncopal episode witnessed by his son. Patient's son caught him from falling. Patient arrives with 20 IV RFA. Patient received 100 mL NS in route from EMS.    Javarie Sheard is a 82 y.o. male.  HPI Patient was discharged from the hospital 5\20\2024.  Dr. 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  Transposition of right subclavian artery to right common carotid artery with ligation of Kommerell diverticulum  2.  Thoracic aorta endovascular grafting with 38 x 117 mm Cook Zenith alpha graft with catheter in the ascending aorta and aortogram 3.  Stent of left common and external iliac arteries with 11 x 59 mm VBX 4.  Percutaneous access and closure bilateral common femoral arteries     Patient is home and has a feeding tube.  He is being cared for by family members.  Today while his son was assisting him to go to the bathroom, patient reports that he got extremely lightheaded and recognize that he would pass out.  He was able to let his son know and he did not have a fall or collapse to the ground.  His son was able to catch him and eased him to the ground.  Patient denies he had any pain in association with this.  He denied he was having chest pain.  He reports he has been short of breath for quite a while and there was no acute or sudden change.  There is been no recent vomiting or diarrhea.  He has not developed a fever.  He has been generally weak and requiring quite a bit of assistance.  No swelling of the legs. Home Medications Prior to Admission medications   Medication Sig Start Date End Date Taking?  Authorizing Provider  albuterol (PROAIR HFA) 108 (90 BASE) MCG/ACT inhaler Inhale 2 puffs into the lungs every 6 (six) hours as needed for wheezing. 05/29/12   Coralyn Helling, MD  allopurinol (ZYLOPRIM) 100 MG tablet Take 1 tablet by mouth daily.  09/13/13   [provider]  aspirin 325 MG tablet Crush1 tablet (325 mg total) and place into feeding tube daily. 03/25/23   Rhyne, Ames Coupe, PA-C  budesonide-formoterol (SYMBICORT) 160-4.5 MCG/ACT inhaler Inhale 2 puffs into the lungs daily.    [provider]  celecoxib (CELEBREX) 200 MG capsule Place 1 capsule (200 mg total) into feeding tube daily. 03/25/23   Rhyne, Ames Coupe, PA-C  docusate (COLACE) 50 MG/5ML liquid Place 10 mLs (100 mg total) into feeding tube daily. 03/25/23   Rhyne, Ames Coupe, PA-C  famotidine (PEPCID) 40 MG tablet Place 1 tablet (40 mg total) into feeding tube daily. 03/25/23   Rhyne, Ames Coupe, PA-C  fluticasone (FLONASE) 50 MCG/ACT nasal spray Place 2 sprays into the nose daily. 12/01/10   [provider]  food thickener (SIMPLYTHICK, NECTAR/LEVEL 2/MILDLY THICK,) GEL Take 1 packet by mouth as needed. 03/24/23   Rhyne, Ames Coupe, PA-C  irbesartan (AVAPRO) 300 MG tablet Place 1 tablet (300 mg total) into feeding tube daily as needed (blood pressure). 03/24/23   Rhyne, Ames Coupe, PA-C  isosorbide dinitrate (ISORDIL)  10 MG tablet Place 1 tablet (10 mg total) into feeding tube 2 (two) times daily with a meal. 03/24/23   Rhyne, Samantha J, PA-C  loratadine (CLARITIN) 10 MG tablet Place 1 tablet (10 mg total) into feeding tube daily. 03/24/23   Rhyne, Ames Coupe, PA-C  Multiple Vitamin (MULTIVITAMIN) capsule Place 1 capsule into feeding tube daily. 03/24/23   Rhyne, Ames Coupe, PA-C  nitroGLYCERIN (NITROSTAT) 0.4 MG SL tablet Place 1 tablet (0.4 mg total) under the tongue every 5 (five) minutes as needed for chest pain. 01/29/22   Revankar, Aundra Dubin, MD  pravastatin (PRAVACHOL) 40 MG tablet Place 1 tablet (40 mg  total) into feeding tube daily. 03/25/23   Rhyne, Ames Coupe, PA-C  primidone (MYSOLINE) 50 MG tablet Place 1 tablet (50 mg total) into feeding tube at bedtime. 03/24/23   Rhyne, Ames Coupe, PA-C  senna-docusate (SENOKOT-S) 8.6-50 MG tablet Place 1 tablet into feeding tube at bedtime as needed for mild constipation. 03/24/23   Rhyne, Ames Coupe, PA-C  tamsulosin (FLOMAX) 0.4 MG CAPS capsule Take 1 capsule (0.4 mg total) by mouth daily. Open capsule and put contents into tube. 03/24/23   Rhyne, Ames Coupe, PA-C  traMADol (ULTRAM) 50 MG tablet Place 1 tablet (50 mg total) into feeding tube every 6 (six) hours as needed for moderate pain. 03/24/23   Rhyne, Ames Coupe, PA-C      Allergies    Metoprolol, Penicillins, and Morphine    Review of Systems   Review of Systems  Physical Exam Updated Vital Signs BP 118/64   Pulse 79   Temp (!) 97.5 F (36.4 C) (Oral)   Resp (!) 22   Ht 5\' 10"  (1.778 m)   Wt 56.7 kg   SpO2 95%   BMI 17.94 kg/m  Physical Exam Constitutional:      Comments: Patient is alert but thin and weak in appearance.  No respiratory distress.  Mental status is clear.  HENT:     Mouth/Throat:     Mouth: Mucous membranes are moist.     Pharynx: Oropharynx is clear.  Eyes:     Extraocular Movements: Extraocular movements intact.  Neck:     Comments: Surgical wound is at the right base of the neck.  Is healing very well.  No drainage no erythema or swelling. Cardiovascular:     Rate and Rhythm: Normal rate and regular rhythm.  Pulmonary:     Comments: No respiratory distress at rest.  Patient has crackles in the left lung base.  Right lung base has very soft breath sounds. Abdominal:     General: There is no distension.     Palpations: Abdomen is soft.     Tenderness: There is no abdominal tenderness. There is no guarding.  Musculoskeletal:     Comments: No peripheral edema.  Calves are soft and nontender.  Skin:    General: Skin is warm and dry.  Neurological:      Comments: Patient seems generally weak but does not have any focal motor deficits.     ED Results / Procedures / Treatments   Labs (all labs ordered are listed, but only abnormal results are displayed) Labs Reviewed  CBC WITH DIFFERENTIAL/PLATELET - Abnormal; Notable for the following components:      Result Value   RBC 2.66 (*)    Hemoglobin 8.4 (*)    HCT 26.8 (*)    MCV 100.8 (*)    Platelets 428 (*)    Monocytes Absolute 1.1 (*)  Abs Immature Granulocytes 0.09 (*)    All other components within normal limits  COMPREHENSIVE METABOLIC PANEL - Abnormal; Notable for the following components:   Sodium 133 (*)    Glucose, Bld 106 (*)    BUN 34 (*)    Calcium 8.2 (*)    Total Protein 5.8 (*)    Albumin 2.6 (*)    All other components within normal limits  URINALYSIS, ROUTINE W REFLEX MICROSCOPIC - Abnormal; Notable for the following components:   Color, Urine AMBER (*)    APPearance HAZY (*)    All other components within normal limits  CBG MONITORING, ED - Abnormal; Notable for the following components:   Glucose-Capillary 104 (*)    All other components within normal limits  TROPONIN I (HIGH SENSITIVITY)  TROPONIN I (HIGH SENSITIVITY)    EKG None EKG is not uploading from Muse.  Sinus rhythm with no acute ischemic appearance.  No significant change from previous EKG tracing. Radiology DG Chest Port 1 View  Result Date: 03/26/2023 CLINICAL DATA:  Syncope . EXAM: PORTABLE CHEST 1 VIEW COMPARISON:  03/17/2023 FINDINGS: Stable elevation of the right hemidiaphragm. Minimal right basilar atelectasis. Previously noted right basilar infiltrate has resolved. No new focal pulmonary infiltrate. No pneumothorax or pleural effusion. Stent graft within the aortic arch and left coronary artery stenting again noted. Cardiac size within normal limits. Pulmonary vascularity is normal. Multiple remote bilateral rib fractures again noted. IMPRESSION: 1. No active disease. 2. Stable elevation  of the right hemidiaphragm. Electronically Signed   By: Helyn Numbers M.D.   On: 03/26/2023 23:35    Procedures Procedures    Medications Ordered in ED Medications - No data to display  ED Course/ Medical Decision Making/ A&P                             Medical Decision Making Amount and/or Complexity of Data Reviewed Labs: ordered. Radiology: ordered.  Risk Decision regarding hospitalization.   Consult: Reviewed at vascular surgery Dr. Sherral Hammers.  Request the patient get admitted to medical service and plan for carotid duplex in the morning.  Vascular surgery will do consultation in the morning.  Patient is orthostatic but not septic in appearance.  Most likely some volume depletion.  Patient has a feeding tube.  Mental status is clear.  No signs of acute stroke.  No signs of acute ACS.  No chest pain.  Patient is chronically short of breath.  Plan for admission to medical service for near syncope and orthostatic hypotension.  Vascular surgery will do consultation in the morning and evaluate further.       Final Clinical Impression(s) / ED Diagnoses Final diagnoses:  Syncope, unspecified syncope type    Rx / DC Orders ED Discharge Orders     None         Arby Barrette, MD 03/27/23 0157

## 2023-03-26 NOTE — Telephone Encounter (Signed)
Good afternoon Dr. Marina Goodell  The following patient was referred for Esophageal dysphagia. He is currently a patient of Dr. Jennye Boroughs and is seeking a second opinion because they have given up on his swallowing issues. He has requested you as his provider. Records are available on Epic. Please review and advise of scheduling.

## 2023-03-26 NOTE — Telephone Encounter (Signed)
Okay for routine office visit.  May be sometime, but this is no emergency

## 2023-03-26 NOTE — ED Provider Notes (Incomplete)
[place] EMERGENCY DEPARTMENT AT [facility] Provider Note   CSN: [id number] Arrival date & time: 03/26/23  2146     History {Add pertinent medical, surgical, social history, OB history to HPI:1} Chief Complaint  Patient presents with  . Loss of Consciousness    Patient to ED via EMS with complaint of syncopal episode witnessed by his son. Patient's son caught him from falling. Patient arrives with 20 IV RFA. Patient received 100 mL NS in route from EMS.    Brian Dunn is a 82 y.o. male.  HPI Patient was discharged from the hospital 5\20\2024.  Dr. 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  Transposition of right subclavian artery to right common carotid artery with ligation of Kommerell diverticulum  2.  Thoracic aorta endovascular grafting with 38 x 117 mm Cook Zenith alpha graft with catheter in the ascending aorta and aortogram 3.  Stent of left common and external iliac arteries with 11 x 59 mm VBX 4.  Percutaneous access and closure bilateral common femoral arteries     Patient is home and has a feeding tube.  He is being cared for by family members.  Today while his son was assisting him to go to the bathroom, patient reports that he got extremely lightheaded and recognize that he would pass out.  He was able to let his son know and he did not have a fall or collapse to the ground.  His son was able to catch him and eased him to the ground.  Patient denies he had any pain in association with this.  He denied he was having chest pain.  He reports he has been short of breath for quite a while and there was no acute or sudden change.  There is been no recent vomiting or diarrhea.  He has not developed a fever.  He has been generally weak and requiring quite a bit of assistance.  No swelling of the legs. Home Medications Prior  to Admission medications   Medication Sig Start Date End Date Taking? Authorizing Provider  albuterol (PROAIR HFA) 108 (90 BASE) MCG/ACT inhaler Inhale 2 puffs into the lungs every 6 (six) hours as needed for wheezing. 05/29/12   Coralyn Helling, MD  allopurinol (ZYLOPRIM) 100 MG tablet Take 1 tablet by mouth daily.  09/13/13   [provider]  aspirin 325 MG tablet Crush1 tablet (325 mg total) and place into feeding tube daily. 03/25/23   Rhyne, Ames Coupe, PA-C  budesonide-formoterol (SYMBICORT) 160-4.5 MCG/ACT inhaler Inhale 2 puffs into the lungs daily.    [provider]  celecoxib (CELEBREX) 200 MG capsule Place 1 capsule (200 mg total) into feeding tube daily. 03/25/23   Rhyne, Ames Coupe, PA-C  docusate (COLACE) 50 MG/5ML liquid Place 10 mLs (100 mg total) into feeding tube daily. 03/25/23   Rhyne, Ames Coupe, PA-C  famotidine (PEPCID) 40 MG tablet Place 1 tablet (40 mg total) into feeding tube daily. 03/25/23   Rhyne, Ames Coupe, PA-C  fluticasone (FLONASE) 50 MCG/ACT nasal spray Place 2 sprays into the nose daily. 12/01/10   [provider]  food thickener (SIMPLYTHICK, NECTAR/LEVEL 2/MILDLY THICK,) GEL Take 1 packet by mouth as needed. 03/24/23   Rhyne, Ames Coupe, PA-C  irbesartan (AVAPRO) 300 MG tablet Place 1 tablet (300 mg total) into feeding tube daily as needed (blood pressure). 03/24/23  Rhyne, Ames Coupe, PA-C  isosorbide dinitrate (ISORDIL) 10 MG tablet Place 1 tablet (10 mg total) into feeding tube 2 (two) times daily with a meal. 03/24/23   Rhyne, Samantha J, PA-C  loratadine (CLARITIN) 10 MG tablet Place 1 tablet (10 mg total) into feeding tube daily. 03/24/23   Rhyne, Ames Coupe, PA-C  Multiple Vitamin (MULTIVITAMIN) capsule Place 1 capsule into feeding tube daily. 03/24/23   Rhyne, Ames Coupe, PA-C  nitroGLYCERIN (NITROSTAT) 0.4 MG SL tablet Place 1 tablet (0.4 mg total) under the tongue every 5 (five) minutes as needed for chest pain. 01/29/22   Revankar, Aundra Dubin, MD  pravastatin (PRAVACHOL) 40 MG tablet Place 1 tablet (40 mg total) into feeding tube daily. 03/25/23   Rhyne, Ames Coupe, PA-C  primidone (MYSOLINE) 50 MG tablet Place 1 tablet (50 mg total) into feeding tube at bedtime. 03/24/23   Rhyne, Ames Coupe, PA-C  senna-docusate (SENOKOT-S) 8.6-50 MG tablet Place 1 tablet into feeding tube at bedtime as needed for mild constipation. 03/24/23   Rhyne, Ames Coupe, PA-C  tamsulosin (FLOMAX) 0.4 MG CAPS capsule Take 1 capsule (0.4 mg total) by mouth daily. Open capsule and put contents into tube. 03/24/23   Rhyne, Ames Coupe, PA-C  traMADol (ULTRAM) 50 MG tablet Place 1 tablet (50 mg total) into feeding tube every 6 (six) hours as needed for moderate pain. 03/24/23   Rhyne, Ames Coupe, PA-C      Allergies    Metoprolol, Penicillins, and Morphine    Review of Systems   Review of Systems  Physical Exam Updated Vital Signs BP 97/66   Pulse (!) 37   Temp (!) 97.5 F (36.4 C) (Oral)   Resp 19   Ht 5\' 10"  (1.778 m)   Wt 56.7 kg   SpO2 (!) 85%   BMI 17.94 kg/m  Physical Exam Constitutional:      Comments: Patient is alert but thin and weak in appearance.  No respiratory distress.  Mental status is clear.  HENT:     Mouth/Throat:     Mouth: Mucous membranes are moist.     Pharynx: Oropharynx is clear.  Eyes:     Extraocular Movements: Extraocular movements intact.  Neck:     Comments: Surgical wound is at the right base of the neck.  Is healing very well.  No drainage no erythema or swelling. Cardiovascular:     Rate and Rhythm: Normal rate and regular rhythm.  Pulmonary:     Comments: No respiratory distress at rest.  Patient has crackles in the left lung base.  Right lung base has very soft breath sounds. Abdominal:     General: There is no distension.     Palpations: Abdomen is soft.     Tenderness: There is no abdominal tenderness. There is no guarding.  Musculoskeletal:     Comments: No peripheral edema.  Calves are soft and  nontender.  Skin:    General: Skin is warm and dry.  Neurological:     Comments: Patient seems generally weak but does not have any focal motor deficits.     ED Results / Procedures / Treatments   Labs (all labs ordered are listed, but only abnormal results are displayed) Labs Reviewed  CBC WITH DIFFERENTIAL/PLATELET - Abnormal; Notable for the following components:      Result Value   RBC 2.66 (*)    Hemoglobin 8.4 (*)    HCT 26.8 (*)    MCV 100.8 (*)    Platelets  428 (*)    Monocytes Absolute 1.1 (*)    Abs Immature Granulocytes 0.09 (*)    All other components within normal limits  CBG MONITORING, ED - Abnormal; Notable for the following components:   Glucose-Capillary 104 (*)    All other components within normal limits  COMPREHENSIVE METABOLIC PANEL  TROPONIN I (HIGH SENSITIVITY)    EKG None EKG is not uploading from [employer].  Sinus rhythm with no acute ischemic appearance.  No significant change from previous EKG tracing. Radiology No results found.  Procedures Procedures  {Document cardiac monitor, telemetry assessment procedure when appropriate:1}  Medications Ordered in ED Medications - No data to display  ED Course/ Medical Decision Making/ A&P   {   Click here for ABCD2, HEART and other calculatorsREFRESH Note before signing :1}                          Medical Decision Making Amount and/or Complexity of Data Reviewed Labs: ordered.   ***  {Document critical care time when appropriate:1} {Document review of labs and clinical decision tools ie heart score, Chads2Vasc2 etc:1}  {Document your independent review of radiology images, and any outside records:1} {Document your discussion with family members, caretakers, and with consultants:1} {Document social determinants of health affecting pt's care:1} {Document your decision making why or why not admission, treatments were needed:1} Final Clinical Impression(s) / ED Diagnoses Final diagnoses:  None     Rx / DC Orders ED Discharge Orders     None

## 2023-03-26 NOTE — Telephone Encounter (Signed)
PT is scheduled for 8/20 at 1040am

## 2023-03-27 ENCOUNTER — Observation Stay (HOSPITAL_COMMUNITY): Payer: Medicare HMO

## 2023-03-27 ENCOUNTER — Observation Stay (HOSPITAL_BASED_OUTPATIENT_CLINIC_OR_DEPARTMENT_OTHER): Payer: Medicare HMO

## 2023-03-27 DIAGNOSIS — Q2549 Other congenital malformations of aorta: Secondary | ICD-10-CM

## 2023-03-27 DIAGNOSIS — D649 Anemia, unspecified: Secondary | ICD-10-CM | POA: Diagnosis not present

## 2023-03-27 DIAGNOSIS — E785 Hyperlipidemia, unspecified: Secondary | ICD-10-CM

## 2023-03-27 DIAGNOSIS — R1319 Other dysphagia: Secondary | ICD-10-CM

## 2023-03-27 DIAGNOSIS — R55 Syncope and collapse: Secondary | ICD-10-CM | POA: Diagnosis present

## 2023-03-27 DIAGNOSIS — R1310 Dysphagia, unspecified: Secondary | ICD-10-CM | POA: Diagnosis not present

## 2023-03-27 HISTORY — DX: Syncope and collapse: R55

## 2023-03-27 LAB — GLUCOSE, CAPILLARY
Glucose-Capillary: 102 mg/dL — ABNORMAL HIGH (ref 70–99)
Glucose-Capillary: 125 mg/dL — ABNORMAL HIGH (ref 70–99)
Glucose-Capillary: 86 mg/dL (ref 70–99)

## 2023-03-27 LAB — URINALYSIS, ROUTINE W REFLEX MICROSCOPIC
Bilirubin Urine: NEGATIVE
Glucose, UA: NEGATIVE mg/dL
Hgb urine dipstick: NEGATIVE
Ketones, ur: NEGATIVE mg/dL
Leukocytes,Ua: NEGATIVE
Nitrite: NEGATIVE
Protein, ur: NEGATIVE mg/dL
Specific Gravity, Urine: 1.021 (ref 1.005–1.030)
pH: 7 (ref 5.0–8.0)

## 2023-03-27 LAB — MAGNESIUM: Magnesium: 2 mg/dL (ref 1.7–2.4)

## 2023-03-27 LAB — ECHOCARDIOGRAM COMPLETE
AR max vel: 2.95 cm2
AV Area VTI: 2.79 cm2
AV Area mean vel: 2.88 cm2
AV Mean grad: 6 mmHg
AV Peak grad: 11.3 mmHg
Ao pk vel: 1.68 m/s
Area-P 1/2: 3.91 cm2
Calc EF: 50.5 %
Height: 70 in
S' Lateral: 3.5 cm
Single Plane A2C EF: 55.5 %
Single Plane A4C EF: 44.4 %
Weight: 2000 oz

## 2023-03-27 LAB — PHOSPHORUS: Phosphorus: 3.1 mg/dL (ref 2.5–4.6)

## 2023-03-27 LAB — LIPID PANEL
Cholesterol: 113 mg/dL (ref 0–200)
HDL: 41 mg/dL (ref >40)
LDL Cholesterol: 64 mg/dL (ref 0–99)
Total CHOL/HDL Ratio: 2.8 RATIO
Triglycerides: 42 mg/dL (ref <150)
VLDL: 8 mg/dL (ref 0–40)

## 2023-03-27 LAB — TROPONIN I (HIGH SENSITIVITY): Troponin I (High Sensitivity): 4 ng/L (ref <18)

## 2023-03-27 MED ORDER — ALLOPURINOL 100 MG PO TABS
100.0000 mg | ORAL_TABLET | Freq: Every day | ORAL | Status: DC
  Administered 2023-03-27 – 2023-03-28 (×2): 100 mg
  Filled 2023-03-27 (×2): qty 1

## 2023-03-27 MED ORDER — LORATADINE 10 MG PO TABS
10.0000 mg | ORAL_TABLET | Freq: Every day | ORAL | Status: DC
  Administered 2023-03-27 – 2023-03-28 (×2): 10 mg
  Filled 2023-03-27 (×2): qty 1

## 2023-03-27 MED ORDER — PRAVASTATIN SODIUM 40 MG PO TABS
40.0000 mg | ORAL_TABLET | Freq: Every day | ORAL | Status: DC
  Administered 2023-03-27: 40 mg
  Filled 2023-03-27: qty 1

## 2023-03-27 MED ORDER — ONDANSETRON HCL 4 MG PO TABS: 4.0000 mg | ORAL_TABLET | Freq: Four times a day (QID) | ORAL | Status: DC | PRN

## 2023-03-27 MED ORDER — TRAMADOL HCL 50 MG PO TABS
50.0000 mg | ORAL_TABLET | Freq: Four times a day (QID) | ORAL | Status: DC | PRN
  Administered 2023-03-27 – 2023-03-28 (×4): 50 mg
  Filled 2023-03-27 (×4): qty 1

## 2023-03-27 MED ORDER — ONDANSETRON HCL 4 MG/2ML IJ SOLN: 4.0000 mg | Freq: Four times a day (QID) | INTRAMUSCULAR | Status: DC | PRN

## 2023-03-27 MED ORDER — OSMOLITE 1.5 CAL PO LIQD
1000.0000 mL | ORAL | Status: DC
  Administered 2023-03-27: 1000 mL
  Filled 2023-03-27 (×3): qty 1000

## 2023-03-27 MED ORDER — CELECOXIB 200 MG PO CAPS
200.0000 mg | ORAL_CAPSULE | Freq: Every day | ORAL | Status: DC
  Administered 2023-03-27 – 2023-03-28 (×2): 200 mg
  Filled 2023-03-27 (×2): qty 1

## 2023-03-27 MED ORDER — DOCUSATE SODIUM 50 MG/5ML PO LIQD
100.0000 mg | Freq: Every day | ORAL | Status: DC
  Filled 2023-03-27 (×2): qty 10

## 2023-03-27 MED ORDER — FOOD THICKENER (SIMPLYTHICK): 1.0000 | ORAL | Status: DC | PRN

## 2023-03-27 MED ORDER — ACETAMINOPHEN 650 MG RE SUPP: 650.0000 mg | Freq: Four times a day (QID) | RECTAL | Status: DC | PRN

## 2023-03-27 MED ORDER — FLUTICASONE PROPIONATE 50 MCG/ACT NA SUSP
2.0000 | Freq: Every day | NASAL | Status: DC
  Administered 2023-03-27 – 2023-03-28 (×2): 2 via NASAL
  Filled 2023-03-27: qty 16

## 2023-03-27 MED ORDER — ASPIRIN 325 MG PO TABS
325.0000 mg | ORAL_TABLET | Freq: Every day | ORAL | Status: DC
  Administered 2023-03-27 – 2023-03-28 (×2): 325 mg
  Filled 2023-03-27 (×2): qty 1

## 2023-03-27 MED ORDER — PRAVASTATIN SODIUM 40 MG PO TABS: 40.0000 mg | ORAL_TABLET | Freq: Every day | ORAL | Status: DC

## 2023-03-27 MED ORDER — SENNOSIDES-DOCUSATE SODIUM 8.6-50 MG PO TABS: 1.0000 | ORAL_TABLET | Freq: Every evening | ORAL | Status: DC | PRN

## 2023-03-27 MED ORDER — MOMETASONE FURO-FORMOTEROL FUM 200-5 MCG/ACT IN AERO
2.0000 | INHALATION_SPRAY | Freq: Two times a day (BID) | RESPIRATORY_TRACT | Status: DC
  Administered 2023-03-27 – 2023-03-28 (×2): 2 via RESPIRATORY_TRACT
  Filled 2023-03-27: qty 8.8

## 2023-03-27 MED ORDER — FAMOTIDINE 20 MG PO TABS
40.0000 mg | ORAL_TABLET | Freq: Every day | ORAL | Status: DC
  Administered 2023-03-27 – 2023-03-28 (×2): 40 mg
  Filled 2023-03-27 (×2): qty 2

## 2023-03-27 MED ORDER — LACTATED RINGERS IV SOLN: Freq: Once | INTRAVENOUS | Status: AC

## 2023-03-27 MED ORDER — ACETAMINOPHEN 325 MG PO TABS
650.0000 mg | ORAL_TABLET | Freq: Four times a day (QID) | ORAL | Status: DC | PRN
  Administered 2023-03-27 – 2023-03-28 (×2): 650 mg via ORAL
  Filled 2023-03-27 (×2): qty 2

## 2023-03-27 MED ORDER — ENOXAPARIN SODIUM 40 MG/0.4ML IJ SOSY
40.0000 mg | PREFILLED_SYRINGE | INTRAMUSCULAR | Status: DC
  Administered 2023-03-27: 40 mg via SUBCUTANEOUS

## 2023-03-27 MED ORDER — TAMSULOSIN HCL 0.4 MG PO CAPS
0.4000 mg | ORAL_CAPSULE | Freq: Every day | ORAL | Status: DC
  Administered 2023-03-27 – 2023-03-28 (×2): 0.4 mg via ORAL
  Filled 2023-03-27 (×2): qty 1

## 2023-03-27 MED ORDER — ADULT MULTIVITAMIN W/MINERALS CH
1.0000 | ORAL_TABLET | Freq: Every day | ORAL | Status: DC
  Administered 2023-03-27 – 2023-03-28 (×2): 1
  Filled 2023-03-27 (×2): qty 1

## 2023-03-27 MED ORDER — SODIUM CHLORIDE 0.9% FLUSH
3.0000 mL | Freq: Two times a day (BID) | INTRAVENOUS | Status: DC
  Administered 2023-03-27 – 2023-03-28 (×4): 3 mL via INTRAVENOUS

## 2023-03-27 NOTE — Assessment & Plan Note (Addendum)
Hold home BP meds for the moment pending MRI brain given presentation with orthostatic hypotension + syncope + high grade basilar artery stenosis. 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

## 2023-03-27 NOTE — Progress Notes (Addendum)
Initial Nutrition Assessment  DOCUMENTATION CODES:   Severe malnutrition in context of chronic illness, Underweight  INTERVENTION:  Initiate TF's:  Osmolite 1.5 @60  ml/hr (1440 ml)  Start at 40 ml/hr and advance by 20 ml q6 hours to goal rate  TF's at goal rate provides 2160 kcal, 90 g protein, 1860 ml fluid   Communicate with RN about starting TF's   Discuss TF concerns and potential solutions with patient and family  Inform family of nutrition provided in TF's   Discussed adequate nutrition and increased needs   NUTRITION DIAGNOSIS:   Severe Malnutrition related to chronic illness as evidenced by severe muscle depletion, severe fat depletion.  GOAL:   Patient will meet greater than or equal to 90% of their needs   MONITOR:   PO intake, I & O's, Supplement acceptance, Labs, Weight trends, TF tolerance, Skin  REASON FOR ASSESSMENT:   Consult Enteral/tube feeding initiation and management  ASSESSMENT:   82 y.o. male with PMHx including HTN, HLD, CAD, recent dx of dysphagia 2/2 aneurysm compressing his esophagus presents after syncopal episode at home  Familiar to nutrition services and this RD  Patient was recently admitted for Kommerell Diverticulum and underwent PEG placement due to severe dysphagia. He was discharged on bolus TF's:   1.5 cartons (355 ml) of Jevity 1.5 cal formula QID at 0800, 1200, 1600, and 2000 (total of 6 cartons daily)             First bolus: 1 carton (237 ml)             Second bolus and goal: 1.5 cartons (355 ml) - Free water flush of 60 ml before and 60 ml after each bolus feed - Additional free water flushes of 100 ml TID   Bolus tube feeding regimen with free water flushes provides 2130 kcal, 90.6 grams of protein, and 1860 ml of H2O.  Visited patient at bedside who reports he is able to tolerate some po intake. He has not had any TF's since being admitted. Patient and his family (son and wife) report that they have been managing his  TF's well at home. He reports gassiness, bloating and nausea after boluses. Patient also reports have 1 BM per day that is pasty and tan colored (similar to what the tube feed looks like). He reports "it is hard to get clean after going" due to it being a pasty consistency.   Patient's wife reports that his prebiotics and probiotics have been d/c'd since arrival to the hospital. His wife reports that MD does not recommend they continue home supplementation.   Patient and his family expressed concern for patient continuing to lose weight despite having all nutrition needs met via TF. RD first explained to them that the weight he gained on previous admission was most likely water weight from TPN which they understood.    RD is concerned for malabsorption given the details about how he feels after boluses and having tan BM's. RD suggested trying Vital AF 1.5 or Molli Posey TF formula to see if patient would tolerate one of those better, however, he reports he may be going home tomorrow and does not want to prolong his stay. RD encouraged him to continue Home TF regimen and to eat po as much as he can. Patient is also likely to be in a hypermetabolic state. RD also advised him to pay attention to his UOP and BM color.    Labs:  Na 133, Glu 106, BUN 34  Meds: colace, pepcid, MVI, NS, lactated ringer bolus  Wt: 3.1 kg (5%) wt loss x 19 days per wt hx  PO: 10% po x 1 meal  I/O's: -161 ml    NUTRITION - FOCUSED PHYSICAL EXAM:  Flowsheet Row Most Recent Value  Orbital Region Severe depletion  Upper Arm Region Severe depletion  Thoracic and Lumbar Region Severe depletion  Buccal Region Severe depletion  Temple Region Severe depletion  Clavicle Bone Region Severe depletion  Clavicle and Acromion Bone Region Severe depletion  Scapular Bone Region Severe depletion  Dorsal Hand Severe depletion  Patellar Region Severe depletion  Anterior Thigh Region Severe depletion  Posterior Calf Region Severe  depletion  Edema (RD Assessment) None  Hair Reviewed  Eyes Reviewed  Mouth Reviewed  Skin Reviewed  Nails Reviewed       Diet Order:   Diet Order             Diet Heart Room service appropriate? Yes; Fluid consistency: Thin  Diet effective now                   EDUCATION NEEDS:   Education needs have been addressed  Skin:  Skin Assessment: Reviewed RN Assessment  Last BM:  5/22  Height:   Ht Readings from Last 1 Encounters:  03/26/23 5\' 10"  (1.778 m)    Weight:   Wt Readings from Last 1 Encounters:  03/26/23 56.7 kg    Ideal Body Weight:     BMI:  Body mass index is 17.94 kg/m.  Estimated Nutritional Needs:   Kcal:  1700-2000  Protein:  70-90 g  Fluid:  >1.7 L    Leodis Rains, RDN, LDN  Clinical Nutrition

## 2023-03-27 NOTE — H&P (Signed)
History and Physical    Patient: Brian Dunn ZOX:[id number] DOB: 01-Aug-1941 DOA: 03/26/2023 DOS: the patient was seen and examined on 03/27/2023 PCP: [facility]  Patient coming from: Home  Chief Complaint:  Chief Complaint  Patient presents with   Loss of Consciousness    Patient to ED via EMS with complaint of syncopal episode witnessed by his son. Patient's son caught him from falling. Patient arrives with 20 IV RFA. Patient received 100 mL NS in route from EMS.   HPI: Brian Dunn is a 82 y.o. male with medical history significant of HTN, HLD, CAD.  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  The patient was admitted to the hospital and taken to the operating room on 03/19/2023 and underwent: 1.  Transposition of right subclavian artery to right common carotid artery with ligation of Kommerell diverticulum  2.  Thoracic aorta endovascular grafting with 38 x 117 mm Cook Zenith alpha graft with catheter in the ascending aorta and aortogram 3.  Stent of left common and external iliac arteries with 11 x 59 mm VBX 4.  Percutaneous access and closure bilateral common femoral arteries.  Pt also had PEG tube placed that admission due to malnutrition.  Pt discharged home, he is being cared for by family members. Today while his son was assisting him to go to the bathroom, patient reports that he got extremely lightheaded and recognize that he would pass out. He was able to let his son know and he did not have a fall or collapse to the ground. His son was able to catch him and eased him to the ground. Patient denies he had any pain in association with this. He denied he was having chest pain. He reports he has been short of breath for quite a while and there was no acute or sudden change. There is been no recent vomiting or diarrhea. He has not developed a fever. He has been generally weak and requiring quite a bit of assistance.  No swelling of the legs.  Further review of the chart also reveals that pt has significant basliar artery disease (near occlusive disease) on CTA head done just a few days ago.  Review of Systems: As mentioned in the history of present illness. All other systems reviewed and are negative. Past Medical History:  Diagnosis Date   Acute bronchitis 05/29/2012   Allergic rhinitis    Anemia in chronic kidney disease 02/27/2021   Aneurysm of right subclavian artery (HCC) 02/07/2023   Angina pectoris (HCC) 11/07/2020   Angina, class III (HCC) 11/07/2020   Asthma 06/18/2012   Followed in Pulmonary clinic/ [facility]/ [place]  - 01/09/2018  After extensive coaching inhaler device  effectiveness =   75% > continue symb 160 2bid and return for pfts in 3 months ? RA bronchiolitis?  - PFT's  04/23/2018  FEV1 2.84 (91 % ) ratio 73  p no % improvement from saba p symb 160 prior to study with DLCO  76 % corrects to 79  % for alv volume   - 04/23/2018  After extensive coaching   Benign essential hypertension 06/20/2016   Bilateral inguinal hernia without obstruction or gangrene 06/20/2016   CAD (coronary artery disease), native coronary artery 06/20/2016   Chest pain, unspecified    Chronic ischemic heart disease, unspecified 01/28/2022   Chronic kidney disease 01/28/2022   COPD (chronic obstructive pulmonary disease) (HCC)    Coronary artery disease involving native coronary  artery of native heart with angina pectoris (HCC) 06/20/2016   Cough 03/25/2012   Followed in Pulmonary clinic/ [facility]/ [place]    - Trial off acei again  03/25/2012    - Sinus CT 01/24/11 There is mucosal thickening within the paranasal sinuses without  air-fluid levels. Neither ostiomeatal unit is patent.    Cough variant asthma 10/18/2010   Followed in Pulmonary clinic/ [facility]/ [place]  -HFA 75% p coaching 01/22/2011  -PFT's  02/04/2011 minimal airflow obst, nl dlco    Disorder of bone and cartilage 01/22/2007    Qualifier: Diagnosis of  By: Drue Novel MD, Nolon Rod.   Overview:  Overview:  Qualifier: Diagnosis of  By: Drue Novel MD, Jose E.   Dysphagia 12/27/2022   Onset ? Aug 2022 p febrile illness ? Asp pna   -  MBS 01/13/23   PO Diet Recommendation: Regular; Thin liquids (Level 0)  Liquid Administration via: Cup; Spoon; Straw  Supervision: Patient able to self-feed  Postural changes: -- (try reclining when sipping liquids)  Oral care recommendations: Oral care QID (4x/day); Oral care before ice   chips/water  Recommended consults: Consider ENT consultation   Encounter for fitting and adjustment of hearing aid 01/29/2022   Essential hypertension 11/06/2017   GERD 01/22/2007   Annotation: history of esophageal stricture Qualifier: Diagnosis of  By: Drue Novel MD, Nolon Rod.    GERD (gastroesophageal reflux disease)    Gout 01/28/2022   HIP FRACTURE 01/22/2007   Annotation: right-sided status post surgery Qualifier: Diagnosis of  By: Drue Novel MD, Nolon Rod.    Hip fracture (HCC)    Hyperlipidemia 06/20/2016   Hyperlipidemia with target LDL less than 70 06/20/2016   Hypertension    HYPOGONADISM 02/18/2007   Qualifier: Diagnosis of  By: Drue Novel MD, Nolon Rod.    Hypogonadism male    Low back pain 09/09/2013   Multiple lung nodules on CT 11/21/2015   CT [place] 10/17/15 mpns > 15 y since quit smoking > rec 12 m f/u as this is low risk > done 06/04/16 no change rec recheck in 36m  - CT [place] 12/08/17 no change nodules > meets benign criteria > no directed f/u - Quantiferon GOLD TB 01/09/18 neg    Osteopenia    Personal history of malignant neoplasm of prostate    Postoperative visit 08/14/2016   Sensorineural hearing loss, bilateral 01/28/2022   Tremor, essential 02/18/2007   Overview:  Overview:  Qualifier: Diagnosis of  By: Drue Novel MD, Nolon Rod.  Last Assessment & Plan:  Reviewed tremor may increase on dulera so will need to be balanced against benefits to cough   Past Surgical History:  Procedure Laterality Date   CAROTID-SUBCLAVIAN BYPASS GRAFT  Right 03/11/2023   Procedure: RIGHT SUBCLAVIAN-CAROTID TRANSPOSITION;  Surgeon: Maeola Harman, MD;  Location: [facility] OR;  Service: Vascular;  Laterality: Right;   CORONARY ANGIOPLASTY WITH STENT PLACEMENT  11/04/2006   CORONARY STENT INTERVENTION N/A 11/16/2020   Procedure: CORONARY STENT INTERVENTION;  Surgeon: Marykay Lex, MD;  Location: [facility] INVASIVE CV LAB;  Service: Cardiovascular;  Laterality: N/A;   EYE SURGERY Bilateral 11/2022   cataract removal   INSERTION OF ILIAC STENT Left 03/11/2023   Procedure: INSERTION OF LEFT ILIAC STENT USING VIABAHN VBX X 54. STENT;  Surgeon: Maeola Harman, MD;  Location: [facility] OR;  Service: Vascular;  Laterality: Left;   INSERTION PROSTATE RADIATION SEED     LEFT HEART CATH AND CORONARY ANGIOGRAPHY N/A 11/16/2020   Procedure: LEFT HEART  CATH AND CORONARY ANGIOGRAPHY;  Surgeon: Marykay Lex, MD;  Location: [facility] INVASIVE CV LAB;  Service: Cardiovascular;  Laterality: N/A;   PEG PLACEMENT N/A 03/19/2023   Procedure: PERCUTANEOUS ENDOSCOPIC GASTROSTOMY (PEG) PLACEMENT;  Surgeon: Diamantina Monks, MD;  Location: [facility] OR;  Service: General;  Laterality: N/A;   THORACIC AORTIC ENDOVASCULAR STENT GRAFT Bilateral 03/11/2023   Procedure: THORACIC AORTIC ENDOVASCULAR STENT GRAFT USING ZENITH ALPHA X STENT (COOK);  Surgeon: Maeola Harman, MD;  Location[facility] OR;  Service: Vascular;  Laterality: Bilateral;   ULTRASOUND GUIDANCE FOR VASCULAR ACCESS Bilateral 03/11/2023   Procedure: ULTRASOUND GUIDANCE FOR VASCULAR ACCESS;  Surgeon: Maeola Harman, MD;  Location: [facility] OR;  Service: Vascular;  Laterality: Bilateral;   Social History:  reports that he quit smoking about 26 years ago. His smoking use included cigarettes. He has a 35.00 pack-year smoking history. He has never used smokeless tobacco. He reports that he does not currently use alcohol. He reports that he does not use drugs.  Allergies  Allergen Reactions    Metoprolol Other (See Comments)    Weight gain   Penicillins Swelling   Morphine Rash    Family History  Problem Relation Age of Onset   Parkinsonism Father    Asthma Father    Throat cancer Sister    Asthma Sister    Heart disease Mother    Heart disease Sister     Prior to Admission medications   Medication Sig Start Date End Date Taking? Authorizing Provider  allopurinol (ZYLOPRIM) 100 MG tablet Place 100 mg into feeding tube daily. 09/13/13  Yes [provider]  aspirin 325 MG tablet Crush1 tablet (325 mg total) and place into feeding tube daily. 03/25/23  Yes Rhyne, Ames Coupe, PA-C  celecoxib (CELEBREX) 200 MG capsule Place 1 capsule (200 mg total) into feeding tube daily. 03/25/23  Yes Rhyne, Ames Coupe, PA-C  docusate (COLACE) 50 MG/5ML liquid Place 10 mLs (100 mg total) into feeding tube daily. 03/25/23  Yes Rhyne, Ames Coupe, PA-C  famotidine (PEPCID) 40 MG tablet Place 1 tablet (40 mg total) into feeding tube daily. 03/25/23  Yes Rhyne, Samantha J, PA-C  fluticasone (FLONASE) 50 MCG/ACT nasal spray Place 2 sprays into the nose daily. 12/01/10  Yes [provider]  food thickener (SIMPLYTHICK, NECTAR/LEVEL 2/MILDLY THICK,) GEL Take 1 packet by mouth as needed. 03/24/23  Yes Rhyne, Samantha J, PA-C  irbesartan (AVAPRO) 300 MG tablet Place 1 tablet (300 mg total) into feeding tube daily as needed (blood pressure). 03/24/23  Yes Rhyne, Samantha J, PA-C  isosorbide dinitrate (ISORDIL) 10 MG tablet Place 1 tablet (10 mg total) into feeding tube 2 (two) times daily with a meal. 03/24/23  Yes Rhyne, Samantha J, PA-C  loratadine (CLARITIN) 10 MG tablet Place 1 tablet (10 mg total) into feeding tube daily. 03/24/23  Yes Rhyne, Ames Coupe, PA-C  Multiple Vitamin (MULTIVITAMIN) capsule Place 1 capsule into feeding tube daily. 03/24/23  Yes Rhyne, Ames Coupe, PA-C  nitroGLYCERIN (NITROSTAT) 0.4 MG SL tablet Place 1 tablet (0.4 mg total) under the tongue every 5 (five) minutes as  needed for chest pain. 01/29/22  Yes Revankar, Aundra Dubin, MD  pravastatin (PRAVACHOL) 40 MG tablet Place 1 tablet (40 mg total) into feeding tube daily. 03/25/23  Yes Rhyne, Ames Coupe, PA-C  primidone (MYSOLINE) 50 MG tablet Place 1 tablet (50 mg total) into feeding tube at bedtime. 03/24/23  Yes Rhyne, Samantha J, PA-C  senna-docusate (SENOKOT-S) 8.6-50 MG tablet  Place 1 tablet into feeding tube at bedtime as needed for mild constipation. 03/24/23  Yes Rhyne, Ames Coupe, PA-C  tamsulosin (FLOMAX) 0.4 MG CAPS capsule Take 1 capsule (0.4 mg total) by mouth daily. Open capsule and put contents into tube. Patient taking differently: 0.4 mg daily. Open capsule and put contents into tube. 03/24/23  Yes Rhyne, Ames Coupe, PA-C  traMADol (ULTRAM) 50 MG tablet Place 1 tablet (50 mg total) into feeding tube every 6 (six) hours as needed for moderate pain. 03/24/23  Yes Rhyne, Ames Coupe, PA-C  budesonide-formoterol (SYMBICORT) 160-4.5 MCG/ACT inhaler Inhale 2 puffs into the lungs daily.    [provider]    Physical Exam: Vitals:   03/27/23 0100 03/27/23 0130 03/27/23 0200 03/27/23 0230  BP: (!) 119/57 112/63 135/68 (!) 119/57  Pulse: 61 74 77 64  Resp: 19 20 (!) 23 (!) 21  Temp:    97.7 F (36.5 C)  TempSrc:      SpO2: 96% 98% 96% 95%  Weight:      Height:       Constitutional: Thin, NAD Respiratory: clear to auscultation bilaterally, no wheezing, no crackles. Normal respiratory effort. No accessory muscle use.  Cardiovascular: Regular rate and rhythm, no murmurs / rubs / gallops. No extremity edema. 2+ pedal pulses. No carotid bruits.  Abdomen: no tenderness, no masses palpated. No hepatosplenomegaly. Bowel sounds positive.  Skin: Surgical wound R base of neck, no erythema nor drainage. Neurologic: Generalized weakness without obvious focal deficits. Psychiatric: Normal judgment and insight. Alert and oriented x 3. Normal mood.   Data Reviewed:     CTA head and neck 03/19/23:  IMPRESSION: 1. Interval postprocedural changes from ligation of Kommerell diverticulum and reimplantation of the right subclavian artery to the right common carotid artery. The right subclavian artery is patent with mild narrowing at the siite of anastomosis. The Kommerell diverticulum is now partially thrombosed. 2. Diffusely small caliber of the basilar artery with areas of intermittent occlusion. This is age indeterminate and of uncertain clinical significance. No prior imaging is available for comparison. Bilateral PCOMs are present and bilateral P2 segments are predominantly supplied by PCOMs. Recommend comparison with prior imaging. 3. Atherosclerotic plaque at the carotid bifurcations without evidence of high-grade stenosis.   CTA chest 5/15: IMPRESSION: 1. Interval endograft stenting of the distal aortic arch and proximal descending thoracic aorta for exclusion of the proximal aberrant right subclavian artery aneurysm. The aneurysm is stable to slightly increased in size since the prior study measuring 2.2 cm, previously 2.0 cm. There is continued filling of the aneurysm/proximal right subclavian artery. 2. Patent right common carotid-subclavian bypass graft with expected surrounding postsurgical changes. 3. Chronic ill-defined centrilobular tree-in-bud nodularity in the right middle and lower lobes, with increased nodular peribronchovascular thickening in the peripheral right middle lobe, concerning for chronic atypical infection. 4. Small right and trace left pleural effusions. 5. Right posterior lower lobe collapse, less likely pneumonia given normal enhancement. 6. Trace pneumoperitoneum presumably related to same-day PEG tube placement. 7. Aortic Atherosclerosis (ICD10-I70.0) and Emphysema (ICD10-J43.9).     Latest Ref Rng & Units 03/26/2023   10:35 PM 03/19/2023    4:25 AM 03/12/2023    5:58 AM  CBC  WBC 4.0 - 10.5 K/uL 10.3  9.5  7.0   Hemoglobin 13.0 - 17.0 g/dL  8.4  8.2  8.9   Hematocrit 39.0 - 52.0 % 26.8  24.9  25.5   Platelets 150 - 400 K/uL 428  252  159  Latest Ref Rng & Units 03/26/2023   10:35 PM 03/21/2023    5:09 AM 03/20/2023    4:46 AM  CMP  Glucose 70 - 99 mg/dL 409  811  914   BUN 8 - 23 mg/dL 34  37  28   Creatinine 0.61 - 1.24 mg/dL 7.82  9.56  2.13   Sodium 135 - 145 mmol/L 133  138  136   Potassium 3.5 - 5.1 mmol/L 4.2  4.1  4.4   Chloride 98 - 111 mmol/L 100  104  102   CO2 22 - 32 mmol/L 25  26  26    Calcium 8.9 - 10.3 mg/dL 8.2  8.4  8.3   Total Protein 6.5 - 8.1 g/dL 5.8   5.5   Total Bilirubin 0.3 - 1.2 mg/dL 0.4   0.6   Alkaline Phos 38 - 126 U/L 96   90   AST 15 - 41 U/L 20   30   ALT 0 - 44 U/L 20   19      Assessment and Plan: * Syncope Syncope with orthostasis. In addition, pt with high grade stenosis of basilar artery that I'm worried may be contributing Syncope pathway Will get the 2d echo Tele monitor FEN per dietary consult Will put on 75 cc/hr overnight Hold home BP meds for now given orthostasis and basliar artery findings Not really much that can be done intervention / stent wise to basilar artery per Dr. Derry Lory. Get Lipid pnl and MRI per him  Kommerell's diverticulum S/p EVAR by vasc surgery earlier this month with R subclavian - Carotid transposition Vasc surg will consult in AM.  Dysphagia Due to Kommerell's diverticulum. Can have sips of water. Dietary consult in AM for PEG tube management.  Anemia Presumably post-procedural.  Appears stable since discharge 8 days ago.  Hypertension Hold home BP meds for the moment pending MRI brain given presentation with orthostatic hypotension + syncope + high grade basilar artery stenosis. 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  Hyperlipidemia with target LDL less than 70 Lipid pnl pending Cont statin.      Advance Care Planning:   Code Status: DNR Yellow form at bedside,  confirmed with family.  Consults: Vasc surg called by EDP and will see in consult in AM  Family Communication: Family at bedside  Severity of Illness: The appropriate patient status for this patient is OBSERVATION. Observation status is judged to be reasonable and necessary in order to provide the required intensity of service to ensure the patient's safety. The patient's presenting symptoms, physical exam findings, and initial radiographic and laboratory data in the context of their medical condition is felt to place them at decreased risk for further clinical deterioration. Furthermore, it is anticipated that the patient will be medically stable for discharge from the hospital within 2 midnights of admission.   Author: Hillary Bow., DO 03/27/2023 3:02 AM  For on call review [email or web address].

## 2023-03-27 NOTE — Care Management Obs Status (Cosign Needed)
MEDICARE OBSERVATION STATUS NOTIFICATION   Patient Details  Name: Brian Dunn MRN: [id number] Date of Birth: 31-Jan-1941   Medicare Observation Status Notification Given:  Yes    Janae Bridgeman, RN 03/27/2023, 12:01 PM

## 2023-03-27 NOTE — Progress Notes (Signed)
  Echocardiogram 2D Echocardiogram has been performed.  Brian Dunn 03/27/2023, 10:57 AM

## 2023-03-27 NOTE — Progress Notes (Signed)
  Progress Note    03/27/2023 9:15 AM * No surgery found *  Subjective: Feeling better today  Vitals:   03/27/23 0439 03/27/23 0730  BP: 117/65 128/63  Pulse: 64 69  Resp: 18 18  Temp: 97.6 F (36.4 C) (!) 97.5 F (36.4 C)  SpO2: 100% 98%    Physical Exam: Awake alert and oriented Moving all extremities dilatation Palpable radial pulse  CBC    Component Value Date/Time   WBC 10.3 03/26/2023 2235   RBC 2.66 (L) 03/26/2023 2235   HGB 8.4 (L) 03/26/2023 2235   HGB 11.1 (L) 01/31/2022 0920   HCT 26.8 (L) 03/26/2023 2235   HCT 32.7 (L) 01/31/2022 0920   PLT 428 (H) 03/26/2023 2235   PLT 262 01/31/2022 0920   MCV 100.8 (H) 03/26/2023 2235   MCV 96 01/31/2022 0920   MCV 95 (A) 02/27/2021 0000   MCH 31.6 03/26/2023 2235   MCHC 31.3 03/26/2023 2235   RDW 13.8 03/26/2023 2235   RDW 11.8 01/31/2022 0920   LYMPHSABS 1.2 03/26/2023 2235   LYMPHSABS 1.5 01/12/2020 1005   MONOABS 1.1 (H) 03/26/2023 2235   EOSABS 0.2 03/26/2023 2235   EOSABS 0.2 01/12/2020 1005   BASOSABS 0.0 03/26/2023 2235   BASOSABS 0.0 01/12/2020 1005    BMET    Component Value Date/Time   NA 133 (L) 03/26/2023 2235   NA 136 02/07/2023 1007   K 4.2 03/26/2023 2235   CL 100 03/26/2023 2235   CO2 25 03/26/2023 2235   GLUCOSE 106 (H) 03/26/2023 2235   BUN 34 (H) 03/26/2023 2235   BUN 27 02/07/2023 1007   CREATININE 0.80 03/26/2023 2235   CREATININE 1.26 (H) 12/20/2022 0949   CALCIUM 8.2 (L) 03/26/2023 2235   GFRNONAA >60 03/26/2023 2235   GFRNONAA 57 (L) 12/20/2022 0949   GFRAA 76 11/07/2020 1442    INR    Component Value Date/Time   INR 1.2 03/11/2023 1742     Intake/Output Summary (Last 24 hours) at 03/27/2023 0915 Last data filed at 03/27/2023 0902 Gross per 24 hour  Intake 88.4 ml  Output 250 ml  Net -161.6 ml     Assessment/plan:  82 y.o. male is s/p repair of Kommerell diverticulum with thoracic endograft and transposition of right subclavian artery to right carotid artery  now here with syncope.  Carotid duplex ordered.  MRI negative for stroke.  Barbara Ahart C. Randie Heinz, MD [facility] Office: [phone] Pager: [phone]  03/27/2023 9:15 AM

## 2023-03-27 NOTE — Assessment & Plan Note (Signed)
Lipid pnl pending Cont statin.

## 2023-03-27 NOTE — TOC Initial Note (Signed)
Transition of Care [facility]) - Initial/Assessment Note    Patient Details  Name: Brian Dunn MRN: [id number] Date of Birth: Jun 21, 1941  Transition of Care [facility]) CM/SW Contact:    Brian Bridgeman, RN Phone Number: 03/27/2023, 12:36 PM  Clinical Narrative:                 CM met with the patient at the bedside to discuss TOC needs.  The patient readmitted to the hospital after syncopal episode at home - wife and son are at the bedside.  The patient is active with [facility] - renewed orders for home health placed and [facility] is aware.  [facility] is aware of patient's admission and Brian Dunn, RNCM will continue to follow the patient for any changes in Enteral feedings - patient has PEG tube - intact and wife provides bolus feeds at the home and has all DME - bolus feeds at home at 08, 12, 16, 20 during the day.  No needs at this time.  The patient will discharge home with family once he is stable to discharge to home by car.  Expected Discharge Plan: Home w Home Health Services Barriers to Discharge: Continued Medical Work up   Patient Goals and CMS Choice Patient states their goals for this hospitalization and ongoing recovery are:: To return home CMS Medicare.gov Compare Post Acute Care list provided to:: Patient Choice offered to / list presented to : Patient      Expected Discharge Plan and Services   Discharge Planning Services: CM Consult Post Acute Care Choice: Home Health, Resumption of Svcs/PTA Provider (Patient is active with [facility] for Enteral feedings, Home health with [facility] for SLP, RN, PT) Living arrangements for the past 2 months: Single Family Home                     Date DME Agency Contacted: 03/27/23 Time DME Agency Contacted: 1234 Representative spoke with at DME Agency: Brian Dunn, RNCM with [facility] will follow for any changes in Enteral feeds orders/ if any HH Arranged: RN, PT, Speech Therapy HH Agency: [facility] Date [facility] Agency Contacted: 03/27/23 Time HH Agency Contacted: 1235 Representative spoke with at [facility] Agency: Brian Dunn, CM is aware of patient's admission back to the hospital - will follow  Prior Living Arrangements/Services Living arrangements for the past 2 months: Single Family Home Lives with:: Adult Children (Lives with spouse, Brian Dunn) Patient language and need for interpreter reviewed:: Yes Do you feel safe going back to the place where you live?: Yes      Need for Family Participation in Patient Care: Yes (Comment) Care giver support system in place?: Yes (comment) Current home services: DME, Home PT, Home RN, Other (comment) (Active with Home health SLP - RN, PT, DME at the home includes Rolator, shower chair and Enteral feeds/tubing for bolus feeds thru [facility] - feedings scheduled for 08, 12, 16, 20 daily by wife) Criminal Activity/Legal Involvement Pertinent to Current Situation/Hospitalization: No - Comment as needed  Activities of Daily Living Home Assistive Devices/Equipment: Hearing aid, Eyeglasses, Walker (specify type) ADL Screening (condition at time of admission) Patient's cognitive ability adequate to safely complete daily activities?: Yes Is the patient deaf or have difficulty hearing?: Yes Does the patient have difficulty seeing, even when wearing glasses/contacts?: No Does the patient have difficulty concentrating, remembering, or making decisions?: No Patient able to express need for assistance with ADLs?: Yes Does the patient have difficulty dressing or bathing?: No  Independently performs ADLs?: No Communication: Independent Dressing (OT): Needs assistance Is this a change from baseline?: Pre-admission baseline Grooming: Needs assistance Is this a change from baseline?: Pre-admission baseline Feeding: Needs assistance Is this a change from baseline?: Pre-admission baseline Bathing: Needs assistance Is this a change from baseline?: Pre-admission  baseline Toileting: Needs assistance Is this a change from baseline?: Pre-admission baseline In/Out Bed: Needs assistance Is this a change from baseline?: Pre-admission baseline Walks in Home: Independent with device (comment) Does the patient have difficulty walking or climbing stairs?: Yes Weakness of Legs: Both Weakness of Arms/Hands: None  Permission Sought/Granted Permission sought to share information with : Case Manager, Family Supports, Oceanographer granted to share information with : Yes, Verbal Permission Granted     Permission granted to share info w AGENCY: [facility], [facility]  Permission granted to share info w Relationship: Spouse - Brian Dunn at bedside     Emotional Assessment Appearance:: Appears stated age Attitude/Demeanor/Rapport: Gracious Affect (typically observed): Accepting Orientation: : Oriented to Self, Oriented to Place, Oriented to  Time, Oriented to Situation Alcohol / Substance Use: Not Applicable Psych Involvement: No (comment)  Admission diagnosis:  Syncope [R55] Syncope, unspecified syncope type [R55] Patient Active Problem List   Diagnosis Date Noted   Syncope 03/27/2023   Esophageal dysmotility 03/15/2023   Protein-calorie malnutrition, severe 03/14/2023   Kommerell's diverticulum 03/11/2023   Preop cardiovascular exam 02/26/2023   Aneurysm of right subclavian artery (HCC) 02/07/2023   Dysphagia 12/27/2022   Encounter for fitting and adjustment of hearing aid 01/29/2022   Chronic kidney disease 01/28/2022   Gout 01/28/2022   Sensorineural hearing loss, bilateral 01/28/2022   Chronic ischemic heart disease, unspecified 01/28/2022   Anemia 02/27/2021   Allergic rhinitis    Hypertension    Angina, class III (HCC) 11/07/2020   Angina pectoris (HCC) 11/07/2020   COPD (chronic obstructive pulmonary disease) (HCC)    GERD (gastroesophageal reflux disease)    Hypogonadism male    Osteopenia    Essential  hypertension 11/06/2017   Postoperative visit 08/14/2016   Bilateral inguinal hernia without obstruction or gangrene 06/20/2016   Coronary artery disease involving native coronary artery of native heart with angina pectoris (HCC) 06/20/2016   Hyperlipidemia with target LDL less than 70 06/20/2016   Benign essential hypertension 06/20/2016   CAD (coronary artery disease), native coronary artery 06/20/2016   Hyperlipidemia 06/20/2016   Multiple lung nodules on CT 11/21/2015   Low back pain 09/09/2013   Asthma 06/18/2012   Acute bronchitis 05/29/2012   Cough 03/25/2012   Cough variant asthma 10/18/2010   HYPOGONADISM 02/18/2007   Tremor, essential 02/18/2007   GERD 01/22/2007   Disorder of bone and cartilage 01/22/2007   CHEST PAIN 01/22/2007   HIP FRACTURE 01/22/2007   HX, PERSONAL, MALIGNANCY, PROSTATE 01/22/2007   Hip fracture (HCC) 01/22/2007   PCP:  Physicians, [facility]:   [id number] - [place], [place] - [address] [place] [address] Phone: [phone] Fax: [phone]  [facility] - [place], [place] - [address] [place] [address] Phone: [phone] Fax: [phone]  [facility] - [place], [place] - [address] [address] Phone: [phone] Fax: [phone]  [facility] [address] [place] [address] Phone: [phone] Fax: [phone]     Social Determinants  of Health (SDOH) Social History: SDOH Screenings   Food Insecurity: No Food Insecurity (03/27/2023)  Housing: Low Risk  (03/27/2023)  Transportation Needs: No Transportation Needs (03/27/2023)  Utilities: Not At Risk (03/27/2023)  Tobacco Use: Medium Risk (03/26/2023)   SDOH Interventions:     Readmission Risk Interventions    03/24/2023    3:23 PM  Readmission Risk  Prevention Plan  Post Dischage Appt Complete  Medication Screening Complete  Transportation Screening Complete

## 2023-03-27 NOTE — Progress Notes (Signed)
Patient seen and examined personally, I reviewed the chart, history and physical and admission note, done by admitting physician this morning and agree with the same with following addendum.  Please refer to the morning admission note for more detailed plan of care.  Briefly,  81 y.o.m w/ history of HTN, HLD, CAD, recently battling with dysphagia secondary to aneurysm compressing his esophagus,  who was admitted to the hospital and taken to the operating room on 03/19/2023 and underwent: Transposition of right subclavian artery to right common carotid artery with ligation of Kommerell diverticulum,thoracic aorta endovascular grafting with 38 x 117 mm Cook Zenith alpha graft with catheter in the ascending aorta and aortogram, Stent of left common and external iliac arteries with 11 x 59 mm VBX,Percutaneous access and closure bilateral common femoral arteries. And Pt also had PEG tube placed that admission due to malnutrition and discharged home with family brought to the ED for pre-syncope where he got extremely lightheaded on the way to bathroom and did not fall, no chest pain.He reports he has been short of breath for quite a while and there was no acute or sudden change. pt has significant basliar artery disease (near occlusive disease) on CTA head done just a few days ago. In the ED found to have surgical on right base of neck no erythema or drainage, with generalized weakness. Vitals stable, orthostatics negative, labs showed mild hyponatremia 133, stable renal function Albumino 2.6, hemoglobin low but baseline 8.4 g.  Imaging with chest x-ray and MRI brain no acute finding no stroke, admitted for further workup.   C/p swelling on left flank- has bruise, no chest pain nausea or vomitng Feels fine Non focal on exam  A/p:  Syncope/orthostasis. High grade stenosis of basilar artery. Orthostatic vitals normal in the ED.  Continue monitoring telemetry, follow-up echocardiogram, dietitian consult, hold  home BP meds for now given orthostasis and basliar artery findings- allow BP to run higher.  " Not really much that can be done intervention / stent wise to basilar artery per Dr. Derry Lory" Ptot eval.   Kommerell's diverticulum:S/p EVAR by vasc surgery earlier this month with R subclavian,carotid transposition.  Vascular surgery has been consulted carotid duplex ordered, follow-up further recommendation   Dysphagia:Due to Kommerell's diverticulum. Can have sips of water.dietary consult in AM for PEG tube management.   Anemia, chronic:Presumably post-procedural.Appears stable since discharge 8 days ago.   Hypertension: soft bp, hold home BP meds Aneurysm is s/p EVAR,FU op Hyperlipidemia with target LDL less than 70: FU LDL,Cont statin.

## 2023-03-27 NOTE — Hospital Course (Addendum)
81 y.o.m w/ history of HTN, HLD, CAD, recently battling with dysphagia secondary to aneurysm compressing his esophagus,  who was admitted to the hospital and taken to the operating room on 03/19/2023 and underwent: Transposition of right subclavian artery to right common carotid artery with ligation of Kommerell diverticulum,thoracic aorta endovascular grafting with 38 x 117 mm Cook Zenith alpha graft with catheter in the ascending aorta and aortogram, Stent of left common and external iliac arteries with 11 x 59 mm VBX,Percutaneous access and closure bilateral common femoral arteries. And Pt also had PEG tube placed that admission due to malnutrition and discharged home with family brought to the ED for pre-syncope where he got extremely lightheaded on the way to bathroom and did not fall, no chest pain.He reports he has been short of breath for quite a while and there was no acute or sudden change. pt has significant basliar artery disease (near occlusive disease) on CTA head done just a few days ago. In the ED found to have surgical on right base of neck no erythema or drainage, with generalized weakness. Vitals stable, orthostatics negative, labs showed mild hyponatremia 133, stable renal function Albumino 2.6, hemoglobin low but baseline 8.4 g.  Imaging with chest x-ray and MRI brain no acute finding no stroke, admitted for further workup.

## 2023-03-27 NOTE — Assessment & Plan Note (Addendum)
Syncope with orthostasis. In addition, pt with high grade stenosis of basilar artery that I'm worried may be contributing Syncope pathway Will get the 2d echo Tele monitor FEN per dietary consult Will put on 75 cc/hr overnight Hold home BP meds for now given orthostasis and basliar artery findings Not really much that can be done intervention / stent wise to basilar artery per Dr. Derry Lory. Get Lipid pnl and MRI per him

## 2023-03-27 NOTE — Assessment & Plan Note (Signed)
Presumably post-procedural.  Appears stable since discharge 8 days ago.

## 2023-03-27 NOTE — Assessment & Plan Note (Addendum)
Due to Kommerell's diverticulum. Can have sips of water. Dietary consult in AM for PEG tube management.

## 2023-03-27 NOTE — ED Notes (Addendum)
ED TO INPATIENT HANDOFF REPORT  ED Nurse Name and Phone #:  Theadora Rama RN [phone]  S Name/Age/Gender Brian Dunn 82 y.o. male Room/Bed: 017C/017C  Code Status   Code Status: Full Code  Home/SNF/Other Home Patient oriented to: x4 Is this baseline? Yes   Triage Complete: Triage complete  Chief Complaint Syncope [R55]  Triage Note No notes on file   Allergies Allergies  Allergen Reactions   Metoprolol Other (See Comments)    Weight gain   Penicillins Swelling   Morphine Rash    Level of Care/Admitting Diagnosis ED Disposition     ED Disposition  Admit   Condition  --   Comment  Hospital Area: [facility] [[id number]]  Level of Care: Telemetry Medical [104]  May place patient in observation at [facility] or [facility] if equivalent level of care is available:: No  Covid Evaluation: Asymptomatic - no recent exposure (last 10 days) testing not required  Diagnosis: Syncope [206001]  Admitting Physician: Hillary Bow [[id number]]  Attending Physician: Hillary Bow [[id number]]          B Medical/Surgery History Past Medical History:  Diagnosis Date   Acute bronchitis 05/29/2012   Allergic rhinitis    Anemia in chronic kidney disease 02/27/2021   Aneurysm of right subclavian artery (HCC) 02/07/2023   Angina pectoris (HCC) 11/07/2020   Angina, class III (HCC) 11/07/2020   Asthma 06/18/2012   Followed in Pulmonary clinic/ [facility]/ [place]  - 01/09/2018  After extensive coaching inhaler device  effectiveness =   75% > continue symb 160 2bid and return for pfts in 3 months ? RA bronchiolitis?  - PFT's  04/23/2018  FEV1 2.84 (91 % ) ratio 73  p no % improvement from saba p symb 160 prior to study with DLCO  76 % corrects to 79  % for alv volume   - 04/23/2018  After extensive coaching   Benign essential hypertension 06/20/2016   Bilateral inguinal hernia without obstruction or gangrene 06/20/2016   CAD (coronary artery disease), native coronary  artery 06/20/2016   Chest pain, unspecified    Chronic ischemic heart disease, unspecified 01/28/2022   Chronic kidney disease 01/28/2022   COPD (chronic obstructive pulmonary disease) (HCC)    Coronary artery disease involving native coronary artery of native heart with angina pectoris (HCC) 06/20/2016   Cough 03/25/2012   Followed in Pulmonary clinic/ [facility]/ [place]    - Trial off acei again  03/25/2012    - Sinus CT 01/24/11 There is mucosal thickening within the paranasal sinuses without  air-fluid levels. Neither ostiomeatal unit is patent.    Cough variant asthma 10/18/2010   Followed in Pulmonary clinic/ [facility]/ [place]  -HFA 75% p coaching 01/22/2011  -PFT's  02/04/2011 minimal airflow obst, nl dlco    Disorder of bone and cartilage 01/22/2007   Qualifier: Diagnosis of  By: Drue Novel MD, Nolon Rod.   Overview:  Overview:  Qualifier: Diagnosis of  By: Drue Novel MD, Jose E.   Dysphagia 12/27/2022   Onset ? Aug 2022 p febrile illness ? Asp pna   -  MBS 01/13/23   PO Diet Recommendation: Regular; Thin liquids (Level 0)  Liquid Administration via: Cup; Spoon; Straw  Supervision: Patient able to self-feed  Postural changes: -- (try reclining when sipping liquids)  Oral care recommendations: Oral care QID (4x/day); Oral care before ice   chips/water  Recommended consults: Consider ENT consultation   Encounter for fitting and adjustment  of hearing aid 01/29/2022   Essential hypertension 11/06/2017   GERD 01/22/2007   Annotation: history of esophageal stricture Qualifier: Diagnosis of  By: Drue Novel MD, Nolon Rod.    GERD (gastroesophageal reflux disease)    Gout 01/28/2022   HIP FRACTURE 01/22/2007   Annotation: right-sided status post surgery Qualifier: Diagnosis of  By: Drue Novel MD, Nolon Rod.    Hip fracture (HCC)    Hyperlipidemia 06/20/2016   Hyperlipidemia with target LDL less than 70 06/20/2016   Hypertension    HYPOGONADISM 02/18/2007   Qualifier: Diagnosis of  By: Drue Novel MD, Nolon Rod.    Hypogonadism  male    Low back pain 09/09/2013   Multiple lung nodules on CT 11/21/2015   CT [place] 10/17/15 mpns > 15 y since quit smoking > rec 12 m f/u as this is low risk > done 06/04/16 no change rec recheck in 50m  - CT [place] 12/08/17 no change nodules > meets benign criteria > no directed f/u - Quantiferon GOLD TB 01/09/18 neg    Osteopenia    Personal history of malignant neoplasm of prostate    Postoperative visit 08/14/2016   Sensorineural hearing loss, bilateral 01/28/2022   Tremor, essential 02/18/2007   Overview:  Overview:  Qualifier: Diagnosis of  By: Drue Novel MD, Nolon Rod.  Last Assessment & Plan:  Reviewed tremor may increase on dulera so will need to be balanced against benefits to cough   Past Surgical History:  Procedure Laterality Date   CAROTID-SUBCLAVIAN BYPASS GRAFT Right 03/11/2023   Procedure: RIGHT SUBCLAVIAN-CAROTID TRANSPOSITION;  Surgeon: Maeola Harman, MD;  Location: [facility] OR;  Service: Vascular;  Laterality: Right;   CORONARY ANGIOPLASTY WITH STENT PLACEMENT  11/04/2006   CORONARY STENT INTERVENTION N/A 11/16/2020   Procedure: CORONARY STENT INTERVENTION;  Surgeon: Marykay Lex, MD;  Location: [facility] INVASIVE CV LAB;  Service: Cardiovascular;  Laterality: N/A;   EYE SURGERY Bilateral 11/2022   cataract removal   INSERTION OF ILIAC STENT Left 03/11/2023   Procedure: INSERTION OF LEFT ILIAC STENT USING VIABAHN VBX X 54. STENT;  Surgeon: Maeola Harman, MD;  Location: [facility] OR;  Service: Vascular;  Laterality: Left;   INSERTION PROSTATE RADIATION SEED     LEFT HEART CATH AND CORONARY ANGIOGRAPHY N/A 11/16/2020   Procedure: LEFT HEART CATH AND CORONARY ANGIOGRAPHY;  Surgeon: Marykay Lex, MD;  Location: [facility] INVASIVE CV LAB;  Service: Cardiovascular;  Laterality: N/A;   PEG PLACEMENT N/A 03/19/2023   Procedure: PERCUTANEOUS ENDOSCOPIC GASTROSTOMY (PEG) PLACEMENT;  Surgeon: Diamantina Monks, MD;  Location: [facility] OR;  Service: General;  Laterality: N/A;   THORACIC  AORTIC ENDOVASCULAR STENT GRAFT Bilateral 03/11/2023   Procedure: THORACIC AORTIC ENDOVASCULAR STENT GRAFT USING ZENITH ALPHA X STENT (COOK);  Surgeon: Maeola Harman, MD;  Location: [facility] OR;  Service: Vascular;  Laterality: Bilateral;   ULTRASOUND GUIDANCE FOR VASCULAR ACCESS Bilateral 03/11/2023   Procedure: ULTRASOUND GUIDANCE FOR VASCULAR ACCESS;  Surgeon: Maeola Harman, MD;  Location: [facility] OR;  Service: Vascular;  Laterality: Bilateral;     A IV Location/Drains/Wounds Patient Lines/Drains/Airways Status     Active Line/Drains/Airways     Name Placement date Placement time Site Days   Peripheral IV 03/26/23 20 G Anterior;Distal;Right Forearm 03/26/23  2200  Forearm  1   Gastrostomy/Enterostomy PEG-jejunostomy 03/19/23  1100  --  8            Intake/Output Last 24 hours No intake or output data in the 24 hours  ending 03/27/23 0315  Labs/Imaging Results for orders placed or performed during the hospital encounter of 03/26/23 (from the past 48 hour(s))  CBG monitoring, ED     Status: Abnormal   Collection Time: 03/26/23  9:56 PM  Result Value Ref Range   Glucose-Capillary 104 (H) 70 - 99 mg/dL    Comment: Glucose reference range applies only to samples taken after fasting for at least 8 hours.  CBC with Differential     Status: Abnormal   Collection Time: 03/26/23 10:35 PM  Result Value Ref Range   WBC 10.3 4.0 - 10.5 K/uL   RBC 2.66 (L) 4.22 - 5.81 MIL/uL   Hemoglobin 8.4 (L) 13.0 - 17.0 g/dL   HCT 81.1 (L) 91.4 - 78.2 %   MCV 100.8 (H) 80.0 - 100.0 fL   MCH 31.6 26.0 - 34.0 pg   MCHC 31.3 30.0 - 36.0 g/dL   RDW 95.6 21.3 - 08.6 %   Platelets 428 (H) 150 - 400 K/uL   nRBC 0.0 0.0 - 0.2 %   Neutrophils Relative % 75 %   Neutro Abs 7.7 1.7 - 7.7 K/uL   Lymphocytes Relative 11 %   Lymphs Abs 1.2 0.7 - 4.0 K/uL   Monocytes Relative 11 %   Monocytes Absolute 1.1 (H) 0.1 - 1.0 K/uL   Eosinophils Relative 2 %   Eosinophils Absolute 0.2 0.0 -  0.5 K/uL   Basophils Relative 0 %   Basophils Absolute 0.0 0.0 - 0.1 K/uL   Immature Granulocytes 1 %   Abs Immature Granulocytes 0.09 (H) 0.00 - 0.07 K/uL    Comment: Performed at [facility] Lab, [address], [place], [place] [address]  Comprehensive metabolic panel     Status: Abnormal   Collection Time: 03/26/23 10:35 PM  Result Value Ref Range   Sodium 133 (L) 135 - 145 mmol/L   Potassium 4.2 3.5 - 5.1 mmol/L   Chloride 100 98 - 111 mmol/L   CO2 25 22 - 32 mmol/L   Glucose, Bld 106 (H) 70 - 99 mg/dL    Comment: Glucose reference range applies only to samples taken after fasting for at least 8 hours.   BUN 34 (H) 8 - 23 mg/dL   Creatinine, Ser 9.62 0.61 - 1.24 mg/dL   Calcium 8.2 (L) 8.9 - 10.3 mg/dL   Total Protein 5.8 (L) 6.5 - 8.1 g/dL   Albumin 2.6 (L) 3.5 - 5.0 g/dL   AST 20 15 - 41 U/L   ALT 20 0 - 44 U/L   Alkaline Phosphatase 96 38 - 126 U/L   Total Bilirubin 0.4 0.3 - 1.2 mg/dL   GFR, Estimated >95 >28 mL/min    Comment: (NOTE) Calculated using the CKD-EPI Creatinine Equation (2021)    Anion gap 8 5 - 15    Comment: Performed at [facility] Lab, [address], [place], [place] [address]  Troponin I (High Sensitivity)     Status: None   Collection Time: 03/26/23 10:35 PM  Result Value Ref Range   Troponin I (High Sensitivity) 4 <18 ng/L    Comment: (NOTE) Elevated high sensitivity troponin I (hsTnI) values and significant  changes across serial measurements may suggest ACS but many other  chronic and acute conditions are known to elevate hsTnI results.  Refer to the "Links" section for chest pain algorithms and additional  guidance. Performed at [facility], [address], [place], [place] [address]   Urinalysis, Routine  w reflex microscopic -Urine, Clean Catch     Status: Abnormal   Collection Time: 03/26/23 11:20 PM  Result Value Ref Range   Color, Urine AMBER (A) YELLOW    Comment: BIOCHEMICALS MAY BE AFFECTED BY COLOR   APPearance HAZY  (A) CLEAR   Specific Gravity, Urine 1.021 1.005 - 1.030   pH 7.0 5.0 - 8.0   Glucose, UA NEGATIVE NEGATIVE mg/dL   Hgb urine dipstick NEGATIVE NEGATIVE   Bilirubin Urine NEGATIVE NEGATIVE   Ketones, ur NEGATIVE NEGATIVE mg/dL   Protein, ur NEGATIVE NEGATIVE mg/dL   Nitrite NEGATIVE NEGATIVE   Leukocytes,Ua NEGATIVE NEGATIVE    Comment: Performed at [facility], [address] [place], [place] [address]  Troponin I (High Sensitivity)     Status: None   Collection Time: 03/27/23  1:10 AM  Result Value Ref Range   Troponin I (High Sensitivity) 4 <18 ng/L    Comment: (NOTE) Elevated high sensitivity troponin I (hsTnI) values and significant  changes across serial measurements may suggest ACS but many other  chronic and acute conditions are known to elevate hsTnI results.  Refer to the "Links" section for chest pain algorithms and additional  guidance. Performed at [facility], [address], [place], [place] [address]    DG Chest Port 1 View  Result Date: 03/26/2023 CLINICAL DATA:  Syncope . EXAM: PORTABLE CHEST 1 VIEW COMPARISON:  03/17/2023 FINDINGS: Stable elevation of the right hemidiaphragm. Minimal right basilar atelectasis. Previously noted right basilar infiltrate has resolved. No new focal pulmonary infiltrate. No pneumothorax or pleural effusion. Stent graft within the aortic arch and left coronary artery stenting again noted. Cardiac size within normal limits. Pulmonary vascularity is normal. Multiple remote bilateral rib fractures again noted. IMPRESSION: 1. No active disease. 2. Stable elevation of the right hemidiaphragm. Electronically Signed   By: Helyn Numbers M.D.   On: 03/26/2023 23:35    Pending Labs Unresulted Labs (From admission, onward)     Start     Ordered   03/27/23 0256  Lipid panel  Once,   URGENT        03/27/23 0256            Vitals/Pain Today's Vitals   03/27/23 0100 03/27/23 0130 03/27/23 0200 03/27/23 0230  BP: (!) 119/57  112/63 135/68 (!) 119/57  Pulse: 61 74 77 64  Resp: 19 20 (!) 23 (!) 21  Temp:    97.7 F (36.5 C)  TempSrc:      SpO2: 96% 98% 96% 95%  Weight:      Height:      PainSc:        Isolation Precautions No active isolations  Medications Medications  sodium chloride flush (NS) 0.9 % injection 3 mL (has no administration in time range)  enoxaparin (LOVENOX) injection 40 mg (has no administration in time range)  acetaminophen (TYLENOL) tablet 650 mg (has no administration in time range)    Or  acetaminophen (TYLENOL) suppository 650 mg (has no administration in time range)  ondansetron (ZOFRAN) tablet 4 mg (has no administration in time range)    Or  ondansetron (ZOFRAN) injection 4 mg (has no administration in time range)  aspirin tablet 325 mg (has no administration in time range)  allopurinol (ZYLOPRIM) tablet 100 mg (has no administration in time range)  mometasone-formoterol (DULERA) 200-5 MCG/ACT inhaler 2 puff (has no administration in time range)  food thickener (SIMPLYTHICK (NECTAR/LEVEL 2/MILDLY THICK)) 1 packet (has no administration  in time range)  celecoxib (CELEBREX) capsule 200 mg (has no administration in time range)  docusate (COLACE) 50 MG/5ML liquid 100 mg (has no administration in time range)  famotidine (PEPCID) tablet 40 mg (has no administration in time range)  fluticasone (FLONASE) 50 MCG/ACT nasal spray 2 spray (has no administration in time range)  loratadine (CLARITIN) tablet 10 mg (has no administration in time range)  tamsulosin (FLOMAX) capsule 0.4 mg (has no administration in time range)  traMADol (ULTRAM) tablet 50 mg (has no administration in time range)  senna-docusate (Senokot-S) tablet 1 tablet (has no administration in time range)  multivitamin with minerals tablet 1 tablet (has no administration in time range)  pravastatin (PRAVACHOL) tablet 40 mg (has no administration in time range)    Mobility walks     Focused Assessments Cardiac  Assessment Handoff:  Cardiac Rhythm: Normal sinus rhythm No results found for: "CKTOTAL", "CKMB", "CKMBINDEX", "TROPONINI" No results found for: "DDIMER" Does the Patient currently have chest pain? No   , Neuro Assessment Handoff:  Swallow screen pass? No   Cardiac Rhythm: Normal sinus rhythm NIH Stroke Scale  Dizziness Present: No Headache Present: No Interval: Initial Level of Consciousness (1a.)   : Alert, keenly responsive LOC Questions (1b. )   : Answers both questions correctly LOC Commands (1c. )   : Performs both tasks correctly Best Gaze (2. )  : Normal Visual (3. )  : No visual loss Facial Palsy (4. )    : Normal symmetrical movements Motor Arm, Left (5a. )   : No drift Motor Arm, Right (5b. ) : No drift Motor Leg, Left (6a. )  : No drift Motor Leg, Right (6b. ) : No drift Limb Ataxia (7. ): Absent Sensory (8. )  : Normal, no sensory loss Best Language (9. )  : No aphasia Dysarthria (10. ): Normal Extinction/Inattention (11.)   : No Abnormality Complete NIHSS TOTAL: 0     Neuro Assessment:   Neuro Checks:   Initial (03/26/23 2208)  Has TPA been given? No If patient is a Neuro Trauma and patient is going to OR before floor call report to 4N Charge nurse: [phone] or [phone]  , Pulmonary Assessment Handoff:  Lung sounds:   O2 Device: Room Air      R Recommendations: See Admitting Provider Note  Report given to:   Additional Notes:  Patient has PEG tube and only eats ice chips.

## 2023-03-27 NOTE — Assessment & Plan Note (Addendum)
S/p EVAR by vasc surgery earlier this month with R subclavian - Carotid transposition Vasc surg will consult in AM.

## 2023-03-27 NOTE — Evaluation (Signed)
Occupational Therapy Evaluation Patient Details Name: Brian Dunn MRN: [id number] DOB: Jan 21, 1941 Today's Date: 03/27/2023   History of Present Illness 82 y.o. male with medical history significant of HTN, HLD, CAD.  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  Admitted after a syncopal edisode at home.   Clinical Impression   Patient admitted for the diagnosis above.  PTA he lives at home with his spouse, walks with a 4WRW, no longer drives, and continues to perform his own ADL from a sit to stand level.  Patient is very close to his baseline, needing generalized supervision for line management for ADL completion and in room mobility/toileting.  No significant OT needs identified in the acute setting, and no post acute OT anticipated.       Recommendations for follow up therapy are one component of a multi-disciplinary discharge planning process, led by the attending physician.  Recommendations may be updated based on patient status, additional functional criteria and insurance authorization.   Assistance Recommended at Discharge Set up Supervision/Assistance  Patient can return home with the following Assist for transportation;Assistance with cooking/housework    Functional Status Assessment  Patient has not had a recent decline in their functional status  Equipment Recommendations  None recommended by OT    Recommendations for Other Services       Precautions / Restrictions Precautions Precautions: Fall Precaution Comments: PEG placed 5/15. Restrictions Weight Bearing Restrictions: No      Mobility Bed Mobility               General bed mobility comments: up in the recliner    Transfers Overall transfer level: Needs assistance Equipment used: Rolling walker (2 wheels) Transfers: Sit to/from Stand, Bed to chair/wheelchair/BSC Sit to Stand: Supervision     Step pivot transfers: Supervision            Balance  Overall balance assessment: Needs assistance Sitting-balance support: Feet supported Sitting balance-Leahy Scale: Good     Standing balance support: Reliant on assistive device for balance Standing balance-Leahy Scale: Fair                             ADL either performed or assessed with clinical judgement   ADL Overall ADL's : At baseline                                       General ADL Comments: Generalized supervision     Vision Baseline Vision/History: 1 Wears glasses Patient Visual Report: No change from baseline       Perception     Praxis      Pertinent Vitals/Pain Pain Assessment Pain Assessment: Faces Faces Pain Scale: Hurts a little bit Pain Location: abdominal PEG site Pain Descriptors / Indicators: Tender Pain Intervention(s): Monitored during session     Hand Dominance Right   Extremity/Trunk Assessment Upper Extremity Assessment Upper Extremity Assessment: Overall WFL for tasks assessed   Lower Extremity Assessment Lower Extremity Assessment: Defer to PT evaluation   Cervical / Trunk Assessment Cervical / Trunk Assessment: Kyphotic   Communication Communication Communication: HOH   Cognition Arousal/Alertness: Awake/alert Behavior During Therapy: WFL for tasks assessed/performed Overall Cognitive Status: Within Functional Limits for tasks assessed  General Comments   VSS on RA    Exercises     Shoulder Instructions      Home Living Family/patient expects to be discharged to:: Private residence Living Arrangements: Spouse/significant other Available Help at Discharge: Family;Available 24 hours/day Type of Home: House Home Access: Stairs to enter [employer] of Steps: 3   Home Layout: One level     Bathroom Shower/Tub: Chief Strategy Officer: Standard Bathroom Accessibility: Yes   Home Equipment: Grab bars -  tub/shower;Shower seat;Rolling Environmental consultant (2 wheels)          Prior Functioning/Environment               Mobility Comments: Walks with a 4WRW ADLs Comments: Continues to perform his own ADL, no longer drives.        OT Problem List: Decreased activity tolerance      OT Treatment/Interventions:      OT Goals(Current goals can be found in the care plan section) Acute Rehab OT Goals Patient Stated Goal: Return home OT Goal Formulation: With patient Time For Goal Achievement: 04/03/23 Potential to Achieve Goals: Good  OT Frequency:      Co-evaluation              AM-PAC OT "6 Clicks" Daily Activity     Outcome Measure Help from another person eating meals?: None Help from another person taking care of personal grooming?: None Help from another person toileting, which includes using toliet, bedpan, or urinal?: A Little Help from another person bathing (including washing, rinsing, drying)?: A Little Help from another person to put on and taking off regular upper body clothing?: None Help from another person to put on and taking off regular lower body clothing?: A Little 6 Click Score: 21   End of Session Equipment Utilized During Treatment: Rolling walker (2 wheels) Nurse Communication: Mobility status  Activity Tolerance: Patient tolerated treatment well Patient left: in chair;with call bell/phone within reach  OT Visit Diagnosis: Unsteadiness on feet (R26.81)                Time: 9147-8295 OT Time Calculation (min): 28 min Charges:  OT General Charges $OT Visit: 1 Visit OT Evaluation $OT Eval Moderate Complexity: 1 Mod OT Treatments $Self Care/Home Management : 8-22 mins  03/27/2023  RP, OTR/L  Acute Rehabilitation Services  Office:  [phone]   Brian Dunn 03/27/2023, 5:43 PM

## 2023-03-28 DIAGNOSIS — I10 Essential (primary) hypertension: Secondary | ICD-10-CM

## 2023-03-28 DIAGNOSIS — R1319 Other dysphagia: Secondary | ICD-10-CM | POA: Diagnosis not present

## 2023-03-28 DIAGNOSIS — R55 Syncope and collapse: Secondary | ICD-10-CM | POA: Diagnosis not present

## 2023-03-28 DIAGNOSIS — R1310 Dysphagia, unspecified: Secondary | ICD-10-CM | POA: Diagnosis not present

## 2023-03-28 LAB — BASIC METABOLIC PANEL
Anion gap: 6 (ref 5–15)
BUN: 21 mg/dL (ref 8–23)
CO2: 27 mmol/L (ref 22–32)
Calcium: 8.5 mg/dL — ABNORMAL LOW (ref 8.9–10.3)
Chloride: 101 mmol/L (ref 98–111)
Creatinine, Ser: 0.79 mg/dL (ref 0.61–1.24)
GFR, Estimated: >60 mL/min (ref >60)
Glucose, Bld: 133 mg/dL — ABNORMAL HIGH (ref 70–99)
Potassium: 4.2 mmol/L (ref 3.5–5.1)
Sodium: 134 mmol/L — ABNORMAL LOW (ref 135–145)

## 2023-03-28 LAB — CBC
HCT: 26 % — ABNORMAL LOW (ref 39.0–52.0)
Hemoglobin: 8.5 g/dL — ABNORMAL LOW (ref 13.0–17.0)
MCH: 31.8 pg (ref 26.0–34.0)
MCHC: 32.7 g/dL (ref 30.0–36.0)
MCV: 97.4 fL (ref 80.0–100.0)
Platelets: 374 10*3/uL (ref 150–400)
RBC: 2.67 MIL/uL — ABNORMAL LOW (ref 4.22–5.81)
RDW: 14.1 % (ref 11.5–15.5)
WBC: 6.4 10*3/uL (ref 4.0–10.5)
nRBC: 0 % (ref 0.0–0.2)

## 2023-03-28 LAB — PHOSPHORUS: Phosphorus: 3.8 mg/dL (ref 2.5–4.6)

## 2023-03-28 LAB — MAGNESIUM: Magnesium: 1.9 mg/dL (ref 1.7–2.4)

## 2023-03-28 LAB — GLUCOSE, CAPILLARY
Glucose-Capillary: 132 mg/dL — ABNORMAL HIGH (ref 70–99)
Glucose-Capillary: 144 mg/dL — ABNORMAL HIGH (ref 70–99)

## 2023-03-28 NOTE — Evaluation (Signed)
Physical Therapy Evaluation Patient Details Name: Brian Dunn MRN: [id number] DOB: [birth year] Today's Date: 03/28/2023  History of Present Illness  82 y.o. male with medical history significant of HTN, HLD, CAD.  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  Admitted after a syncopal edisode at home.  Clinical Impression   Pt presents with generalized weakness, impaired balance, impaired activity tolerance, and abdominal pain from peg tube. Pt to benefit from acute PT to address deficits. Pt ambulated good hallway distance with use of RW and increased time, also stands x5 minutes with SL support only during clean up after BM. Pt eager to d/c home, no reports of dizziness throughout session. PT to progress mobility as tolerated, and will continue to follow acutely.          Recommendations for follow up therapy are one component of a multi-disciplinary discharge planning process, led by the attending physician.  Recommendations may be updated based on patient status, additional functional criteria and insurance authorization.  Follow Up Recommendations       Assistance Recommended at Discharge Frequent or constant Supervision/Assistance  Patient can return home with the following  A little help with walking and/or transfers;A little help with bathing/dressing/bathroom;Assistance with cooking/housework;Assist for transportation;Help with stairs or ramp for entrance    Equipment Recommendations None recommended by PT  Recommendations for Other Services       Functional Status Assessment Patient has had a recent decline in their functional status and demonstrates the ability to make significant improvements in function in a reasonable and predictable amount of time.     Precautions / Restrictions Precautions Precautions: Fall Precaution Comments: PEG placed 5/15. Restrictions Weight Bearing Restrictions: No      Mobility  Bed  Mobility Overal bed mobility: Needs Assistance Bed Mobility: Supine to Sit Rolling: Supervision         General bed mobility comments: for safety, use of bedrails and HOB elevation    Transfers Overall transfer level: Needs assistance Equipment used: Rolling walker (2 wheels) Transfers: Sit to/from Stand Sit to Stand: Supervision, From elevated surface           General transfer comment: use of bedrails    Ambulation/Gait Ambulation/Gait assistance: Min guard Gait Distance (Feet): 150 Feet Assistive device: Rolling walker (2 wheels) Gait Pattern/deviations: Step-through pattern, Decreased step length - right, Decreased step length - left, Trunk flexed Gait velocity: decr     General Gait Details: cues for upright posture, pt with chronic forward truncal flexion which is exacerbated by fatigue  Stairs         General stair comments: pt declines need to practice stairs prior to d/c  Wheelchair Mobility    Modified Rankin (Stroke Patients Only)       Balance Overall balance assessment: Needs assistance Sitting-balance support: Feet supported Sitting balance-Leahy Scale: Good     Standing balance support: Reliant on assistive device for balance Standing balance-Leahy Scale: Fair                               Pertinent Vitals/Pain Pain Assessment Pain Assessment: Faces Faces Pain Scale: Hurts a little bit Pain Location: abdominal PEG site Pain Descriptors / Indicators: Tender    Home Living Family/patient expects to be discharged to:: Private residence Living Arrangements: Spouse/significant other Available Help at Discharge: Family;Available 24 hours/day Type of Home: House Home Access: Stairs to enter   [employer] of Steps:  3   Home Layout: One level Home Equipment: Grab bars - tub/shower;Shower Counsellor (2 wheels);Rollator (4 wheels)      Prior Function               Mobility Comments: walks with  rollator ADLs Comments: Continues to perform his own ADL, no longer drives. pt does say he gets set up help for ADLs as needed     Hand Dominance   Dominant Hand: Right    Extremity/Trunk Assessment   Upper Extremity Assessment Upper Extremity Assessment: Defer to OT evaluation    Lower Extremity Assessment Lower Extremity Assessment: Generalized weakness    Cervical / Trunk Assessment Cervical / Trunk Assessment: Kyphotic  Communication   Communication: HOH  Cognition Arousal/Alertness: Awake/alert Behavior During Therapy: WFL for tasks assessed/performed Overall Cognitive Status: Within Functional Limits for tasks assessed                                 General Comments: gets frustrated with toileting tasks        General Comments      Exercises     Assessment/Plan    PT Assessment Patient needs continued PT services  PT Problem List Decreased strength;Decreased mobility;Decreased activity tolerance;Decreased balance;Decreased knowledge of use of DME;Decreased safety awareness       PT Treatment Interventions DME instruction;Gait training;Stair training;Functional mobility training;Therapeutic activities;Therapeutic exercise;Balance training;Patient/family education    PT Goals (Current goals can be found in the Care Plan section)  Acute Rehab PT Goals PT Goal Formulation: With patient Time For Goal Achievement: 04/11/23 Potential to Achieve Goals: Good    Frequency Min 3X/week     Co-evaluation               AM-PAC PT "6 Clicks" Mobility  Outcome Measure Help needed turning from your back to your side while in a flat bed without using bedrails?: A Little Help needed moving from lying on your back to sitting on the side of a flat bed without using bedrails?: A Little Help needed moving to and from a bed to a chair (including a wheelchair)?: A Little Help needed standing up from a chair using your arms (e.g., wheelchair or bedside  chair)?: A Little Help needed to walk in hospital room?: A Little Help needed climbing 3-5 steps with a railing? : A Lot 6 Click Score: 17    End of Session   Activity Tolerance: Patient limited by fatigue Patient left: in bed;with call bell/phone within reach;with bed alarm set (sitting EOB) Nurse Communication: Mobility status PT Visit Diagnosis: Muscle weakness (generalized) (M62.81);Difficulty in walking, not elsewhere classified (R26.2)    Time: 5784-6962 PT Time Calculation (min) (ACUTE ONLY): 27 min   Charges:   PT Evaluation $PT Eval Low Complexity: 1 Low PT Treatments $Therapeutic Activity: 8-22 mins        Marye Round, PT DPT Acute Rehabilitation Services Secure Chat Preferred  Office [phone]   Daniqua Campoy E Stroup 03/28/2023, 11:11 AM

## 2023-03-28 NOTE — Discharge Summary (Signed)
Physician Discharge Summary   Patient: Brian Dunn MRN: [id number] DOB: April 08, 1941  Admit date:     03/26/2023  Discharge date: 03/28/2023  Discharge Physician: Pennie Banter   PCP: Physicians, [facility]   Recommendations at discharge:    Follow up as scheduled with Vascular Surgery Follow up with Primary Care in 1-2 weeks Follow up on BP and result ARB and Imdur when appropriated, these were held on discharge to avoid hypotension as BP's were controlled and soft at times with these held. Repeat CBC, BMP, Mg in 1-2 weeks  Discharge Diagnoses: Principal Problem:   Syncope Active Problems:   Anemia   Dysphagia   Kommerell's diverticulum   Hyperlipidemia with target LDL less than 70   Hypertension  Resolved Problems:   * No resolved hospital problems. [facility] Course: 82 y.o.m w/ history of HTN, HLD, CAD, recently battling with dysphagia secondary to aneurysm compressing his esophagus,  who was admitted to the hospital and taken to the operating room on 03/19/2023 and underwent: Transposition of right subclavian artery to right common carotid artery with ligation of Kommerell diverticulum,thoracic aorta endovascular grafting with 38 x 117 mm Cook Zenith alpha graft with catheter in the ascending aorta and aortogram, Stent of left common and external iliac arteries with 11 x 59 mm VBX,Percutaneous access and closure bilateral common femoral arteries. And Pt also had PEG tube placed that admission due to malnutrition and discharged home with family brought to the ED for pre-syncope where he got extremely lightheaded on the way to bathroom and did not fall, no chest pain.He reports he has been short of breath for quite a while and there was no acute or sudden change. pt has significant basliar artery disease (near occlusive disease) on CTA head done just a few days ago. In the ED found to have surgical on right base of neck no erythema or drainage, with generalized  weakness. Vitals stable, orthostatics negative, labs showed mild hyponatremia 133, stable renal function Albumino 2.6, hemoglobin low but baseline 8.4 g.  Imaging with chest x-ray and MRI brain no acute finding no stroke, admitted for further workup.   Pt was admitted for further evaluation. Vascular surgery consulted.  Recommended carotid duplex ultrasound which showed patent ICA's bilaterally, antegrade vertebral flow bilaterally, normal flow in subclavian arteries.    Echocardiogram with LVEF 50-55%, normal diastolic function, no significant valvular disease, no intra-atrial shunt.       Assessment and Plan:  Syncope/orthostasis. High grade stenosis of basilar artery. Orthostatic vitals normal in the ED.   No events seen on telemetry monitoring  Echocardiogram unremarkable for cause. Hold home BP meds for now given orthostasis and basliar artery findings- allow BP to run higher.   Will hold these on discharge as BP's are well controlled, avoid hypotension as suspect this was likely cause of his syncope. Per neurology, "Not really much that can be done intervention / stent wise to basilar artery (Dr. Derry Lory) PT/OT evaluations  HH ordered to continue with PT, RN, SLP   Kommerell's diverticulum: S/p EVAR by vasc surgery earlier this month with R subclavian,carotid transposition.   --Vascular surgery consulted  --Carotid duplex unremarkable --Follow up with vascular surgery outpatient as scheudled   Dysphagia: Due to Kommerell's diverticulum.  Can have sips of water. RD was consulted for continuous tube feeds during admission. Resume home bolus feed regimen at d/c.   Anemia, chronic: Presumably post-procedural. Appears stable since discharge last week   Hypertension: soft bp,  hold home BP meds Aneurysm is s/p EVAR, FU op  Hyperlipidemia with target LDL less than 70: FU LDL,Cont statin.       Consultants: Vascular surgery  Procedures performed: None    Disposition: Home health  Diet recommendation:  Discharge Diet Orders (From admission, onward)     Start     Ordered   03/28/23 0000        TUBE FEEDS, sips liquids as tolerated 03/28/23 1048            DISCHARGE MEDICATION: Allergies as of 03/28/2023       Reactions   Metoprolol Other (See Comments)   Weight gain   Penicillins Swelling   Morphine Rash        Medication List     STOP taking these medications    irbesartan 300 MG tablet Commonly known as: AVAPRO   isosorbide dinitrate 10 MG tablet Commonly known as: ISORDIL       TAKE these medications    allopurinol 100 MG tablet Commonly known as: ZYLOPRIM Place 100 mg into feeding tube daily.   aspirin 325 MG tablet Crush1 tablet (325 mg total) and place into feeding tube daily.   budesonide-formoterol 160-4.5 MCG/ACT inhaler Commonly known as: SYMBICORT Inhale 2 puffs into the lungs daily.   celecoxib 200 MG capsule Commonly known as: CELEBREX Place 1 capsule (200 mg total) into feeding tube daily.   docusate 50 MG/5ML liquid Commonly known as: COLACE Place 10 mLs (100 mg total) into feeding tube daily.   famotidine 40 MG tablet Commonly known as: PEPCID Place 1 tablet (40 mg total) into feeding tube daily.   fluticasone 50 MCG/ACT nasal spray Commonly known as: FLONASE Place 2 sprays into the nose daily.   food thickener Gel Commonly known as: SIMPLYTHICK (NECTAR/LEVEL 2/MILDLY THICK) Take 1 packet by mouth as needed.   loratadine 10 MG tablet Commonly known as: CLARITIN Place 1 tablet (10 mg total) into feeding tube daily.   multivitamin capsule Place 1 capsule into feeding tube daily.   nitroGLYCERIN 0.4 MG SL tablet Commonly known as: NITROSTAT Place 1 tablet (0.4 mg total) under the tongue every 5 (five) minutes as needed for chest pain.   pravastatin 40 MG tablet Commonly known as: PRAVACHOL Place 1 tablet (40 mg total) into feeding tube daily.   primidone 50 MG  tablet Commonly known as: MYSOLINE Place 1 tablet (50 mg total) into feeding tube at bedtime.   senna-docusate 8.6-50 MG tablet Commonly known as: Senokot-S Place 1 tablet into feeding tube at bedtime as needed for mild constipation.   tamsulosin 0.4 MG Caps capsule Commonly known as: FLOMAX Take 1 capsule (0.4 mg total) by mouth daily. Open capsule and put contents into tube. What changed: how to take this   traMADol 50 MG tablet Commonly known as: ULTRAM Place 1 tablet (50 mg total) into feeding tube every 6 (six) hours as needed for moderate pain.        Follow-up Information     [facility].   Specialty: Home Health Services Contact information: [address] [place] [address] [phone]                Discharge Exam: Ceasar Mons Weights   03/26/23 2150  Weight: 56.7 kg   General exam: awake, alert, no acute distress, frail, underweight HEENT: atraumatic, clear conjunctiva, anicteric sclera, moist mucus membranes, hearing grossly normal  Respiratory system: CTA, no wheezes, rales or rhonchi, normal  respiratory effort. Cardiovascular system: normal S1/S2, RRR, no JVD, murmurs, rubs, gallops, no pedal edema.   Gastrointestinal system: PEG tube in place with tube feeds running, soft, NT, ND, no HSM felt, +bowel sounds. Central nervous system: A&O x 4. no gross focal neurologic deficits, normal speech Extremities: moves all, no edema, normal tone Skin: dry, intact, normal temperature Psychiatry: normal mood, congruent affect, judgement and insight appear normal   Condition at discharge: stable  The results of significant diagnostics from this hospitalization (including imaging, microbiology, ancillary and laboratory) are listed below for reference.   Imaging Studies: VAS US CAROTID  Result Date: 03/27/2023 Carotid Arterial Duplex Study Patient Name:  Brian Dunn  Date of Exam:   03/27/2023 Medical Rec #: [id number]           Accession #:    [id number] Date of Birth: [birth year]          Patient Gender: M Patient Age:   82 years Exam Location:  [facility] Procedure:      VAS US CAROTID Referring Phys: Lemar Livings --------------------------------------------------------------------------------  Indications:       Syncope. Risk Factors:      Hypertension, hyperlipidemia, coronary artery disease. Other Factors:     Status post right subclavian to carotid transpostion and                    thoracic endocraft 03/11/23. Comparison Study:  Prior cartoid duplex done 02/21/2023 indicated 1-39% bilateral                    ICA stenosis. Performing Technologist: Sherren Kerns RVS  Examination Guidelines: A complete evaluation includes B-mode imaging, spectral Doppler, color Doppler, and power Doppler as needed of all accessible portions of each vessel. Bilateral testing is considered an integral part of a complete examination. Limited examinations for reoccurring indications may be performed as noted.  Right Carotid Findings: +----------+--------+--------+--------+---------------------+------------------+           PSV cm/sEDV cm/sStenosisPlaque Description   Comments           +----------+--------+--------+--------+---------------------+------------------+ CCA Prox  90      15                                   intimal thickening +----------+--------+--------+--------+---------------------+------------------+ CCA Distal109     17                                   intimal thickening +----------+--------+--------+--------+---------------------+------------------+ ICA Prox  97      20      1-39%   calcific and         tortuous                                             irregular                               +----------+--------+--------+--------+---------------------+------------------+ ICA Mid   93      18  tortuous            +----------+--------+--------+--------+---------------------+------------------+ ICA Distal101     19                                   tortuous           +----------+--------+--------+--------+---------------------+------------------+ ECA       143     3                                                       +----------+--------+--------+--------+---------------------+------------------+ +----------+--------+-------+--------+-------------------+           PSV cm/sEDV cmsDescribeArm Pressure (mmHG) +----------+--------+-------+--------+-------------------+ [id number]                                         +----------+--------+-------+--------+-------------------+ +---------+--------+--+--------+-+ VertebralPSV cm/s76EDV cm/s5 +---------+--------+--+--------+-+  Left Carotid Findings: +----------+--------+--------+--------+------------------+------------------+           PSV cm/sEDV cm/sStenosisPlaque DescriptionComments           +----------+--------+--------+--------+------------------+------------------+ CCA Prox  79      4                                 intimal thickening +----------+--------+--------+--------+------------------+------------------+ CCA Distal75      18                                intimal thickening +----------+--------+--------+--------+------------------+------------------+ ICA Prox  88      15      1-39%   calcific                             +----------+--------+--------+--------+------------------+------------------+ ICA Mid   90      18                                                   +----------+--------+--------+--------+------------------+------------------+ ICA Distal137     33                                                   +----------+--------+--------+--------+------------------+------------------+ ECA       142     4                                                     +----------+--------+--------+--------+------------------+------------------+ +----------+--------+--------+--------+-------------------+           PSV cm/sEDV cm/sDescribeArm Pressure (mmHG) +----------+--------+--------+--------+-------------------+ [id number]                                         +----------+--------+--------+--------+-------------------+ +---------+--------+--+--------+--+  VertebralPSV cm/s69EDV cm/s11 +---------+--------+--+--------+--+   Summary: Right Carotid: Velocities in the right ICA are consistent with a 1-39% stenosis. Left Carotid: Velocities in the left ICA are consistent with a 1-39% stenosis. Vertebrals:  Bilateral vertebral arteries demonstrate antegrade flow. Subclavians: Normal flow hemodynamics were seen in bilateral subclavian              arteries. *See table(s) above for measurements and observations.  Electronically signed by Heath Lark on 03/27/2023 at 5:44:35 PM.    Final    ECHOCARDIOGRAM COMPLETE  Result Date: 03/27/2023    ECHOCARDIOGRAM REPORT   Patient Name:   Brian Dunn Date of Exam: 03/27/2023 Medical Rec #:  [id number]         Height:       70.0 in Accession #:    [id number]        Weight:       125.0 lb Date of Birth:  December 02, 1940         BSA:          1.709 m Patient Age:    81 years          BP:           128/63 mmHg Patient Gender: M                 HR:           69 bpm. Exam Location:  Inpatient Procedure: 2D Echo, 3D Echo, Cardiac Doppler and Color Doppler Indications:    R55 Syncope  History:        Patient has no prior history of Echocardiogram examinations.                 CAD, COPD, Signs/Symptoms:Syncope and Chest Pain; Risk                 Factors:Hypertension and Dyslipidemia.  Sonographer:    Sheralyn Boatman RDCS Referring Phys: [phone] JARED M GARDNER  Sonographer Comments: Technically difficult study due to poor echo windows. Image acquisition challenging due to respiratory motion. Patient had recent vascular surgery with  abdominal dressing. IMPRESSIONS  1. Left ventricular ejection fraction, by estimation, is 50 to 55%. The left ventricle has low normal function. The left ventricle has no regional wall motion abnormalities. Left ventricular diastolic parameters were normal.  2. Right ventricular systolic function is normal. The right ventricular size is normal.  3. The mitral valve is normal in structure. No evidence of mitral valve regurgitation. No evidence of mitral stenosis.  4. The aortic valve is normal in structure. Aortic valve regurgitation is not visualized. No aortic stenosis is present.  5. There is borderline dilatation of the ascending aorta, measuring 39 mm.  6. The inferior vena cava is dilated in size with >50% respiratory variability, suggesting right atrial pressure of 8 mmHg. FINDINGS  Left Ventricle: Left ventricular ejection fraction, by estimation, is 50 to 55%. The left ventricle has low normal function. The left ventricle has no regional wall motion abnormalities. The left ventricular internal cavity size was normal in size. There is no left ventricular hypertrophy. Left ventricular diastolic parameters were normal. Right Ventricle: The right ventricular size is normal. No increase in right ventricular wall thickness. Right ventricular systolic function is normal. Left Atrium: Left atrial size was normal in size. Right Atrium: Right atrial size was normal in size. Pericardium: There is no evidence of pericardial effusion. Mitral Valve: The mitral valve is normal in structure. No evidence of mitral valve regurgitation.  No evidence of mitral valve stenosis. Tricuspid Valve: The tricuspid valve is normal in structure. Tricuspid valve regurgitation is trivial. No evidence of tricuspid stenosis. Aortic Valve: The aortic valve is normal in structure. Aortic valve regurgitation is not visualized. No aortic stenosis is present. Aortic valve mean gradient measures 6.0 mmHg. Aortic valve peak gradient measures 11.3  mmHg. Aortic valve area, by VTI measures 2.79 cm. Pulmonic Valve: The pulmonic valve was normal in structure. Pulmonic valve regurgitation is trivial. No evidence of pulmonic stenosis. Aorta: The aortic root is normal in size and structure. There is borderline dilatation of the ascending aorta, measuring 39 mm. Venous: The inferior vena cava is dilated in size with greater than 50% respiratory variability, suggesting right atrial pressure of 8 mmHg. IAS/Shunts: No atrial level shunt detected by color flow Doppler.  LEFT VENTRICLE PLAX 2D LVIDd:         5.00 cm      Diastology LVIDs:         3.50 cm      LV e' medial:    9.03 cm/s LV PW:         0.90 cm      LV E/e' medial:  8.9 LV IVS:        1.00 cm      LV e' lateral:   9.46 cm/s LVOT diam:     2.50 cm      LV E/e' lateral: 8.5 LV SV:         112 LV SV Index:   65 LVOT Area:     4.91 cm                              3D Volume EF: LV Volumes (MOD)            3D EF:        50 % LV vol d, MOD A2C: 101.0 ml LV EDV:       163 ml LV vol d, MOD A4C: 119.0 ml LV ESV:       82 ml LV vol s, MOD A2C: 44.9 ml  LV SV:        81 ml LV vol s, MOD A4C: 66.2 ml LV SV MOD A2C:     56.1 ml LV SV MOD A4C:     119.0 ml LV SV MOD BP:      56.6 ml RIGHT VENTRICLE             IVC RV S prime:     15.50 cm/s  IVC diam: 2.30 cm TAPSE (M-mode): 2.4 cm LEFT ATRIUM           Index        RIGHT ATRIUM           Index LA diam:      2.20 cm 1.29 cm/m   RA Area:     14.60 cm LA Vol (A2C): 32.5 ml 19.01 ml/m  RA Volume:   35.40 ml  20.71 ml/m LA Vol (A4C): 21.3 ml 12.46 ml/m  AORTIC VALVE AV Area (Vmax):    2.95 cm AV Area (Vmean):   2.88 cm AV Area (VTI):     2.79 cm AV Vmax:           168.00 cm/s AV Vmean:          117.000 cm/s AV VTI:            0.401  m AV Peak Grad:      11.3 mmHg AV Mean Grad:      6.0 mmHg LVOT Vmax:         101.00 cm/s LVOT Vmean:        68.700 cm/s LVOT VTI:          0.228 m LVOT/AV VTI ratio: 0.57  AORTA Ao Root diam: 3.30 cm Ao Asc diam:  3.75 cm MITRAL VALVE MV  Area (PHT): 3.91 cm    SHUNTS MV Decel Time: 194 msec    Systemic VTI:  0.23 m MV E velocity: 80.20 cm/s  Systemic Diam: 2.50 cm MV A velocity: 78.60 cm/s MV E/A ratio:  1.02 Aditya Sabharwal Electronically signed by Dorthula Nettles Signature Date/Time: 03/27/2023/11:48:29 AM    Final    MR BRAIN WO CONTRAST  Result Date: 03/27/2023 CLINICAL DATA:  82 year old male with TIA symptoms. Diminutive basilar artery on recent CTA. History of reimplanted aberrant origin right subclavian artery. EXAM: MRI HEAD WITHOUT CONTRAST TECHNIQUE: Multiplanar, multiecho pulse sequences of the brain and surrounding structures were obtained without intravenous contrast. COMPARISON:  CTA neck 03/19/2023. Most recent Head CT is 03/09/2015 [facility] FINDINGS: Brain: Cerebral volume is within normal limits for age. No restricted diffusion to suggest acute infarction. No midline shift, mass effect, evidence of mass lesion, ventriculomegaly, extra-axial collection or acute intracranial hemorrhage. Cervicomedullary junction and pituitary are within normal limits. Largely normal for age gray and white matter signal throughout the brain. Minimal to mild nonspecific scattered and patchy white matter T2/FLAIR hyperintensity. No cortical encephalomalacia or chronic cerebral blood products identified. Deep gray nuclei, brainstem and cerebellum appear within normal limits for age. Vascular: Major intracranial vascular flow voids are preserved with diminutive basilar artery, dominant distal left vertebral artery, evidence of fetal type bilateral PCA origins as on CTA. Skull and upper cervical spine: Negative for age visible cervical spine, degenerative ligamentous hypertrophy about the odontoid. Visualized bone marrow signal is within normal limits. Sinuses/Orbits: Postoperative changes to the globes. Paranasal Visualized paranasal sinuses and mastoids are stable and well aerated. Other: Grossly normal visible internal auditory  structures. Negative visible scalp and face. IMPRESSION: No acute intracranial abnormality and largely normal for age noncontrast MRI appearance of the Brain. Electronically Signed   By: Odessa Fleming M.D.   On: 03/27/2023 04:18   DG Chest Port 1 View  Result Date: 03/26/2023 CLINICAL DATA:  Syncope . EXAM: PORTABLE CHEST 1 VIEW COMPARISON:  03/17/2023 FINDINGS: Stable elevation of the right hemidiaphragm. Minimal right basilar atelectasis. Previously noted right basilar infiltrate has resolved. No new focal pulmonary infiltrate. No pneumothorax or pleural effusion. Stent graft within the aortic arch and left coronary artery stenting again noted. Cardiac size within normal limits. Pulmonary vascularity is normal. Multiple remote bilateral rib fractures again noted. IMPRESSION: 1. No active disease. 2. Stable elevation of the right hemidiaphragm. Electronically Signed   By: Helyn Numbers M.D.   On: 03/26/2023 23:35   CT Angio Chest Pulmonary Embolism (PE) W or WO Contrast  Result Date: 03/19/2023 CLINICAL DATA:  Chest pain. Status post thoracic aortic endograft and right carotid subclavian bypass graft for aberrant right subclavian artery aneurysm. Peg tube placement today. EXAM: CT ANGIOGRAPHY CHEST WITH CONTRAST TECHNIQUE: Multidetector CT imaging of the chest was performed using the standard protocol during bolus administration of intravenous contrast. Multiplanar CT image reconstructions and MIPs were obtained to evaluate the vascular anatomy. RADIATION DOSE REDUCTION: This exam was performed according to the departmental dose-optimization program which includes  automated exposure control, adjustment of the mA and/or kV according to patient size and/or use of iterative reconstruction technique. CONTRAST:  OMNIPAQUE IOHEXOL 350 MG/ML SOLN COMPARISON:  Chest x-ray dated Mar 17, 2023. CT chest dated April 24, 2022. FINDINGS: Cardiovascular: Interval endograft stenting of the distal aortic arch and proximal  descending thoracic aorta for exclusion of the proximal aberrant right subclavian artery aneurysm. The aneurysm is stable to slightly increased in size since the prior study measuring 2.2 cm, previously 2.0 cm. There is continued filling of the aneurysm/proximal right subclavian artery (series 5, images 50 and 59). The remainder of the artery is occluded to the level of the new right common carotid-subclavian bypass graft, which is patent (series 8, image 72). There are expected postsurgical changes and subcutaneous emphysema right supraclavicular region. No thoracic aortic aneurysm. Coronary, aortic arch, and branch vessel atherosclerotic vascular disease. No pulmonary embolism. Normal heart size. No pericardial effusion. Mediastinum/Nodes: Precarinal lymph node measures 1.2 cm in short axis, not significantly changed. No enlarged axillary or hilar lymph nodes. Thyroid gland, trachea, and esophagus demonstrate no significant findings. Lungs/Pleura: Small right and trace left pleural effusions. Chronic ill-defined centrilobular tree-in-bud nodularity in the right middle and lower lobes, with increased nodular peribronchovascular thickening in the peripheral right middle lobe. Right basal lower lobe collapse, less likely consolidation given normal enhancement. Subsegmental atelectasis in the posterior left lower lobe. No pneumothorax. Mild centrilobular emphysema. Upper Abdomen: No acute abnormality. Trace pneumoperitoneum noted presumably related to same-day PEG tube placement. Musculoskeletal: No chest wall abnormality. No acute or significant osseous findings. Chronic T9 compression deformity again noted. Review of the MIP images confirms the above findings. IMPRESSION: 1. Interval endograft stenting of the distal aortic arch and proximal descending thoracic aorta for exclusion of the proximal aberrant right subclavian artery aneurysm. The aneurysm is stable to slightly increased in size since the prior study  measuring 2.2 cm, previously 2.0 cm. There is continued filling of the aneurysm/proximal right subclavian artery. 2. Patent right common carotid-subclavian bypass graft with expected surrounding postsurgical changes. 3. Chronic ill-defined centrilobular tree-in-bud nodularity in the right middle and lower lobes, with increased nodular peribronchovascular thickening in the peripheral right middle lobe, concerning for chronic atypical infection. 4. Small right and trace left pleural effusions. 5. Right posterior lower lobe collapse, less likely pneumonia given normal enhancement. 6. Trace pneumoperitoneum presumably related to same-day PEG tube placement. 7. Aortic Atherosclerosis (ICD10-I70.0) and Emphysema (ICD10-J43.9). Electronically Signed   By: Obie Dredge M.D.   On: 03/19/2023 14:28   CT ANGIO NECK W OR WO CONTRAST  Result Date: 03/19/2023 CLINICAL DATA:  Carotid stenosis EXAM: CT ANGIOGRAPHY NECK TECHNIQUE: Multidetector CT imaging of the neck was performed using the standard protocol during bolus administration of intravenous contrast. Multiplanar CT image reconstructions and MIPs were obtained to evaluate the vascular anatomy. Carotid stenosis measurements (when applicable) are obtained utilizing NASCET criteria, using the distal internal carotid diameter as the denominator. RADIATION DOSE REDUCTION: This exam was performed according to the departmental dose-optimization program which includes automated exposure control, adjustment of the mA and/or kV according to patient size and/or use of iterative reconstruction technique. CONTRAST:  OMNIPAQUE IOHEXOL 350 MG/ML SOLN COMPARISON:  CT CAP 04/24/22 FINDINGS: Aortic arch: Interval postprocedural and postsurgical changes from ligation of the previously Kommerell diverticulum and reimplantation of the right subclavian artery to the right common carotid artery. The right subclavian artery is patent with mild narrowing of the anastomosis site the  Kommerell bile diverticulum is now  partially thrombosed. Right carotid system: There is atherosclerotic plaque of the carotid bifurcation without high-grade Left carotid system: There is atherosclerotic plaque at the carotid bifurcation without evidence of high-grade Vertebral arteries: Left dominant. The right vertebral artery predominantly terminates in a PICA basilar artery is diffusely small caliber with possible areas of intermittent occlusion (series 7, 21 image. This is age indeterminate and of uncertain clinical significance). No prior imaging is available for comparison. Skeleton: No worrisome osseous lesions. There is grade 1 anterolisthesis of C4 on C5 and C5 on C6. Other neck: Soft stranding and subcutaneous air in the region of the right common carotid artery is favored postsurgical Upper chest: See separately dictated CT of the chest for additional findings below the thoracic inlet. IMPRESSION: 1. Interval postprocedural changes from ligation of Kommerell diverticulum and reimplantation of the right subclavian artery to the right common carotid artery. The right subclavian artery is patent with mild narrowing at the siite of anastomosis. The Kommerell diverticulum is now partially thrombosed. 2. Diffusely small caliber of the basilar artery with areas of intermittent occlusion. This is age indeterminate and of uncertain clinical significance. No prior imaging is available for comparison. Bilateral PCOMs are present and bilateral P2 segments are predominantly supplied by PCOMs. Recommend comparison with prior imaging. 3. Atherosclerotic plaque at the carotid bifurcations without evidence of high-grade stenosis. Aortic Atherosclerosis (ICD10-I70.0). Electronically Signed   By: Lorenza Cambridge M.D.   On: 03/19/2023 14:10   DG CHEST PORT 1 VIEW  Result Date: 03/17/2023 CLINICAL DATA:  Status post PICC placement EXAM: PORTABLE CHEST 1 VIEW COMPARISON:  03/12/2023 FINDINGS: Stent graft is again noted  within the aorta. Left PICC is seen with the tip at the cavoatrial junction. No new infiltrate is seen. Stable scarring at the left base is noted. No bony abnormality is noted. IMPRESSION: New left PICC is seen in satisfactory position. Chronic changes in the right base. Electronically Signed   By: Alcide Clever M.D.   On: 03/17/2023 23:02   [place] EKG SITE RITE  Result Date: 03/17/2023 If Site Rite image not attached, placement could not be confirmed due to current cardiac rhythm.  CT ABDOMEN WO CONTRAST  Result Date: 03/16/2023 CLINICAL DATA:  Significant dysphagia with inability to place standard feeding tube. Evaluation of gastric and abdominal anatomy for possible gastrostomy tube placement. EXAM: CT ABDOMEN WITHOUT CONTRAST TECHNIQUE: Multidetector CT imaging of the abdomen was performed following the standard protocol without IV contrast. RADIATION DOSE REDUCTION: This exam was performed according to the departmental dose-optimization program which includes automated exposure control, adjustment of the mA and/or kV according to patient size and/or use of iterative reconstruction technique. COMPARISON:  CT of the abdomen on 04/24/2022 at [facility] FINDINGS: Lower chest: Trace bilateral pleural effusions and dense atelectasis at the right lung base. Hepatobiliary: Unremarkable unenhanced appearance of the liver. Density in the gallbladder is consistent with some mixture of vicariously excreted contrast with bile. No biliary ductal dilatation identified. Pancreas: Unremarkable. No pancreatic ductal dilatation or surrounding inflammatory changes. Spleen: Normal in size without focal abnormality. Adrenals/Urinary Tract: Adrenal glands are unremarkable. Kidneys are normal, without renal calculi, focal lesion, or hydronephrosis. Stomach/Bowel: No hiatal hernia. The stomach is decompressed. The body and antrum of the stomach are located posterior to the transverse colon which is interposed between the  stomach and anterior abdominal wall. No evidence of bowel obstruction or significant ileus. No free intraperitoneal air identified. Vascular/Lymphatic: Diffuse atherosclerosis of the abdominal aorta without aneurysm. No lymphadenopathy identified. Other:  No ascites, focal fluid collections or anasarca. No abdominal wall hernia. Musculoskeletal: Stable degenerative disc disease of the lumbar spine and loss of height of the L1 vertebral body. IMPRESSION: 1. The body and antrum of the stomach are located posterior to the transverse colon which is interposed between the stomach and anterior abdominal wall. The patient is not a candidate for percutaneous gastrostomy tube placement due to high risk of colonic injury. Recommend general surgical consultation for surgical gastrostomy versus jejunostomy if in continued need of supplemental nutrition due to dysphagia. 2. Trace bilateral pleural effusions and dense atelectasis at the right lung base. 3. Atherosclerosis of the abdominal aorta without aneurysm. 4. Stable degenerative disc disease of the lumbar spine and loss of height of the L1 vertebral body. Aortic Atherosclerosis (ICD10-I70.0). Electronically Signed   By: Irish Lack M.D.   On: 03/16/2023 12:34   DG C-Arm 1-60 Min-No Report  Result Date: 03/15/2023 Fluoroscopy was utilized by the requesting physician.  No radiographic interpretation.   DG Swallowing Func-Speech Pathology  Result Date: 03/14/2023 Table formatting from the original result was not included. Modified Barium Swallow Study Patient Details Name: Brian Dunn MRN: [id number] Date of Birth: 1941-02-15 Today's Date: 03/14/2023 HPI/PMH: HPI: Pt is an 82 y/o male who presented for surgery, with the diagnosis of Kommerell's diverticulum. Pt s/p transposition of right SCA to right CCA with ligation of Kommerell Diverticulum, thoracic aortic endovascular stent graft, stent of left CIA and EIA 5/7.  PMH: vascular disease and CAD. MBS 01/13/23:  lordosis of his cervical spine, thinning of the prevertebral space, and what appears to be hypertrophy in the mid-superior pharynx (presents similarly to CP bar but located anatomically higher). Poor base-of-tongue to pharyngeal wall contact and the epiglottis is unable to invert past its horizontal position. There is good laryngeal elevation; the arytenoids never fully meet the epiglottic petiole, minimal distension of the PES. Factors led to significant residue that accumulates in the pharynx, regardess of consistency, and is aspirated either during or after the swallow response. A regular texture diet with thin liquids was recommended with observance of swallowing precautions considering the severity of symptoms across consistencies. ENT referral at [facility] and outpatient SLP services recommended. Per Dr. Randie Heinz,  CT scan appeared to show the diverticulum to be compressing the esophagus against the trachea.  He discussed with the patient and his family at bedside that this is very likely at least a contributing factor to his dysphagia given the large size of the diverticulum relative to the small area where it existed. Clinical Impression: Clinical Impression: Pt demonstrates some improvement since prior MBS, but dysphagia is still severe with high risk of aspiration. Study limited due to pt request to stop. Pt demonstrates good oral function, and very good laryngeal elevation and glottic closure. Pt initiates laryngeal elevation, but hyoid excursion, epiglottic deflection and UES opening are particularly problematic. Pt can transit bolus, hold glottis closed and apply effort for partial propulsive movement. Pts curved epiglottis does not invert and large amounts of residue collect there. Ultimately, after releasing glottic closure pt had penetration of nectar and puree and aspiration of thin (did not test honey or chewable solids). Pt had some success with a 'stretch and tuck' posture to straighten cervical lordosis  and facilitate bolus transit (trialed on sequence 10, 11, 12 though movement too subtle to see). Cues for effort also helpful. Overall, pt is at high risk of aspiration and likely has been aspirating for some time. The effort needed to eat  is likely disproportionate to the nutrition recieved. Recommend NPO during acute recovery. Pt needs to initiate pharyngeal strengthening exercises as able with reassessment next week. MD agreed to placement of cortrak today. Factors that may increase risk of adverse event in presence of aspiration Rubye Oaks & Clearance Coots 2021): Factors that may increase risk of adverse event in presence of aspiration Rubye Oaks & Clearance Coots 2021): Weak cough; Aspiration of thick, dense, and/or acidic materials; Frequent aspiration of large volumes Recommendations/Plan: Swallowing Evaluation Recommendations Swallowing Evaluation Recommendations Recommendations: NPO; Alternative means of nutrition - NG Tube PO Diet Recommendation: Regular; Thin liquids (Level 0) Liquid Administration via: Cup; Spoon; Straw Medication Administration: Via alternative means Supervision: Patient able to self-feed Postural changes: -- (try reclining when sipping liquids) Oral care recommendations: Oral care QID (4x/day); Oral care before ice chips/water Recommended consults: Consider ENT consultation Treatment Plan Treatment Plan Treatment recommendations: Therapy as outlined in treatment plan below Follow-up recommendations: Acute inpatient rehab (3 hours/day) Functional status assessment: Patient has had a recent decline in their functional status and demonstrates the ability to make significant improvements in function in a reasonable and predictable amount of time. Treatment frequency: Min 2x/week Treatment duration: 2 weeks Interventions: Oropharyngeal exercises; Compensatory techniques Recommendations Recommendations for follow up therapy are one component of a multi-disciplinary discharge planning process, led by the  attending physician.  Recommendations may be updated based on patient status, additional functional criteria and insurance authorization. Assessment: Orofacial Exam: Orofacial Exam Oral Cavity: Oral Hygiene: WFL Oral Cavity - Dentition: Missing dentition Orofacial Anatomy: WFL Oral Motor/Sensory Function: WFL (no focal CN deficits) Anatomy: Anatomy: WFL Boluses Administered: Boluses Administered Boluses Administered: Thin liquids (Level 0); Mildly thick liquids (Level 2, nectar thick); Puree  Oral Impairment Domain: Oral Impairment Domain Lip Closure: No labial escape Tongue control during bolus hold: Cohesive bolus between tongue to palatal seal Bolus preparation/mastication: Slow prolonged chewing/mashing with complete recollection Bolus transport/lingual motion: Slow tongue motion Oral residue: Residue collection on oral structures Location of oral residue : Tongue Initiation of pharyngeal swallow : Valleculae  Pharyngeal Impairment Domain: Pharyngeal Impairment Domain Soft palate elevation: No bolus between soft palate (SP)/pharyngeal wall (PW) Laryngeal elevation: Complete superior movement of thyroid cartilage with complete approximation of arytenoids to epiglottic petiole Anterior hyoid excursion: Partial anterior movement Epiglottic movement: No inversion Laryngeal vestibule closure: Incomplete, narrow column air/contrast in laryngeal vestibule Pharyngeal stripping wave : Present - diminished Pharyngeal contraction (A/P view only): N/A Pharyngoesophageal segment opening: Minimal distention/minimal duration, marked obstruction of flow Tongue base retraction: Wide column of contrast or air between tongue base and PPW Pharyngeal residue: Majority of contrast within or on pharyngeal structures Location of pharyngeal residue: Valleculae; Pyriform sinuses; Pharyngeal wall  Esophageal Impairment Domain: No data recorded Pill: No data recorded Penetration/Aspiration Scale Score: Penetration/Aspiration Scale Score 4.   Material enters airway, CONTACTS cords then ejected out: Mildly thick liquids (Level 2, nectar thick) 5.  Material enters airway, CONTACTS cords and not ejected out: Puree 6.  Material enters airway, passes BELOW cords then ejected out: Thin liquids (Level 0) Compensatory Strategies: Compensatory Strategies Compensatory strategies: Yes Straw: Ineffective Effortful swallow: Effective Multiple swallows: Effective Chin tuck: Effective Ineffective Chin Tuck: Thin liquid (Level 0) Left head turn: Ineffective Ineffective Left Head Turn: Thin liquid (Level 0); Puree Right head turn: Ineffective Ineffective Right Head Turn: Thin liquid (Level 0); Puree Reclining posture: Effective Effective Reclining Posture: Thin liquid (Level 0) (may have helped marginally with transition of thin liquids through PES and minimizing aspiration)   General Information: Caregiver  present: No  Diet Prior to this Study: Moderately thick liquids (Level 3, honey thick)   Temperature : Normal   Respiratory Status: WFL   Supplemental O2: None (Room air)   History of Recent Intubation: No  Behavior/Cognition: Alert; Cooperative Self-Feeding Abilities: Able to self-feed Baseline vocal quality/speech: Normal Volitional Cough: Able to elicit Volitional Swallow: Able to elicit Exam Limitations: Poor bolus acceptance; Fatigue Goal Planning: Prognosis for improved oropharyngeal function: Fair Barriers to Reach Goals: Severity of deficits; Time post onset No data recorded Patient/Family Stated Goal: none stated Consulted and agree with results and recommendations: Patient Pain: Pain Assessment Pain Assessment: No/denies pain Pain Score: 5 Faces Pain Scale: 0 Pain Location: incision site Pain Intervention(s): Monitored during session End of Session: Start Time:SLP Start Time (ACUTE ONLY): 1255 Stop Time: SLP Stop Time (ACUTE ONLY): 1320 Time Calculation:SLP Time Calculation (min) (ACUTE ONLY): 25 min Charges: SLP Evaluations $ SLP Speech Visit: 1 Visit SLP  Evaluations $BSS Swallow: 1 Procedure $MBS Swallow: 1 Procedure $Outpatient MBS Swallow: 1 Procedure $Swallowing Treatment: 1 Procedure SLP visit diagnosis: SLP Visit Diagnosis: Dysphagia, pharyngoesophageal phase (R13.14) Past Medical History: Past Medical History: Diagnosis Date  Acute bronchitis 05/29/2012  Allergic rhinitis   Anemia in chronic kidney disease 02/27/2021  Aneurysm of right subclavian artery (HCC) 02/07/2023  Angina pectoris (HCC) 11/07/2020  Angina, class III (HCC) 11/07/2020  Asthma 06/18/2012  Followed in Pulmonary clinic/ [facility]/ [facility]  - 01/09/2018  After extensive coaching inhaler device  effectiveness =   75% > continue symb 160 2bid and return for pfts in 3 months ? RA bronchiolitis?  - PFT's  04/23/2018  FEV1 2.84 (91 % ) ratio 73  p no % improvement from saba p symb 160 prior to study with DLCO  76 % corrects to 79  % for alv volume   - 04/23/2018  After extensive coaching  Benign essential hypertension 06/20/2016  Bilateral inguinal hernia without obstruction or gangrene 06/20/2016  CAD (coronary artery disease), native coronary artery 06/20/2016  Chest pain, unspecified   Chronic ischemic heart disease, unspecified 01/28/2022  Chronic kidney disease 01/28/2022  COPD (chronic obstructive pulmonary disease) (HCC)   Coronary artery disease involving native coronary artery of native heart with angina pectoris (HCC) 06/20/2016  Cough 03/25/2012  Followed in Pulmonary clinic/ [facility]/ [place]    - Trial off acei again  03/25/2012    - Sinus CT 01/24/11 There is mucosal thickening within the paranasal sinuses without  air-fluid levels. Neither ostiomeatal unit is patent.   Cough variant asthma 10/18/2010  Followed in Pulmonary clinic/ [facility]/ [facility]  -HFA 75% p coaching 01/22/2011  -PFT's  02/04/2011 minimal airflow obst, nl dlco   Disorder of bone and cartilage 01/22/2007  Qualifier: Diagnosis of  By: Drue Novel MD, Nolon Rod.   Overview:  Overview:  Qualifier: Diagnosis of  By: Drue Novel  MD, Jose E.  Dysphagia 12/27/2022  Onset ? Aug 2022 p febrile illness ? Asp pna   -  MBS 01/13/23   PO Diet Recommendation: Regular; Thin liquids (Level 0)  Liquid Administration via: Cup; Spoon; Straw  Supervision: Patient able to self-feed  Postural changes: -- (try reclining when sipping liquids)  Oral care recommendations: Oral care QID (4x/day); Oral care before ice   chips/water  Recommended consults: Consider ENT consultation  Encounter for fitting and adjustment of hearing aid 01/29/2022  Essential hypertension 11/06/2017  GERD 01/22/2007  Annotation: history of esophageal stricture Qualifier: Diagnosis of  By: Drue Novel MD, Nolon Rod.  GERD (gastroesophageal reflux disease)   Gout 01/28/2022  HIP FRACTURE 01/22/2007  Annotation: right-sided status post surgery Qualifier: Diagnosis of  By: Drue Novel MD, Nolon Rod.   Hip fracture (HCC)   Hyperlipidemia 06/20/2016  Hyperlipidemia with target LDL less than 70 06/20/2016  Hypertension   HYPOGONADISM 02/18/2007  Qualifier: Diagnosis of  By: Drue Novel MD, Nolon Rod.   Hypogonadism male   Low back pain 09/09/2013  Multiple lung nodules on CT 11/21/2015  CT [place] 10/17/15 mpns > 15 y since quit smoking > rec 12 m f/u as this is low risk > done 06/04/16 no change rec recheck in 43m  - CT [place] 12/08/17 no change nodules > meets benign criteria > no directed f/u - Quantiferon GOLD TB 01/09/18 neg   Osteopenia   Personal history of malignant neoplasm of prostate   Postoperative visit 08/14/2016  Sensorineural hearing loss, bilateral 01/28/2022  Tremor, essential 02/18/2007  Overview:  Overview:  Qualifier: Diagnosis of  By: Drue Novel MD, Nolon Rod.  Last Assessment & Plan:  Reviewed tremor may increase on dulera so will need to be balanced against benefits to cough Past Surgical History: Past Surgical History: Procedure Laterality Date  CAROTID-SUBCLAVIAN BYPASS GRAFT Right 03/11/2023  Procedure: RIGHT SUBCLAVIAN-CAROTID TRANSPOSITION;  Surgeon: Maeola Harman, MD;  Location: [facility] OR;  Service:  Vascular;  Laterality: Right;  CORONARY ANGIOPLASTY WITH STENT PLACEMENT  11/04/2006  CORONARY STENT INTERVENTION N/A 11/16/2020  Procedure: CORONARY STENT INTERVENTION;  Surgeon: Marykay Lex, MD;  Location: [facility] INVASIVE CV LAB;  Service: Cardiovascular;  Laterality: N/A;  EYE SURGERY Bilateral 11/2022  cataract removal  INSERTION OF ILIAC STENT Left 03/11/2023  Procedure: INSERTION OF LEFT ILIAC STENT USING VIABAHN VBX X 54. STENT;  Surgeon: Maeola Harman, MD;  Location: [facility] OR;  Service: Vascular;  Laterality: Left;  INSERTION PROSTATE RADIATION SEED    LEFT HEART CATH AND CORONARY ANGIOGRAPHY N/A 11/16/2020  Procedure: LEFT HEART CATH AND CORONARY ANGIOGRAPHY;  Surgeon: Marykay Lex, MD;  Location: [facility] INVASIVE CV LAB;  Service: Cardiovascular;  Laterality: N/A;  THORACIC AORTIC ENDOVASCULAR STENT GRAFT Bilateral 03/11/2023  Procedure: THORACIC AORTIC ENDOVASCULAR STENT GRAFT USING ZENITH ALPHA X STENT (COOK);  Surgeon: Maeola Harman, MD;  Location: [facility] OR;  Service: Vascular;  Laterality: Bilateral;  ULTRASOUND GUIDANCE FOR VASCULAR ACCESS Bilateral 03/11/2023  Procedure: ULTRASOUND GUIDANCE FOR VASCULAR ACCESS;  Surgeon: Maeola Harman, MD;  Location: [facility] OR;  Service: Vascular;  Laterality: Bilateral; Harlon Ditty, MA CCC-SLP Acute Rehabilitation Services Secure Chat Preferred Office [phone] Claudine Mouton 03/14/2023, 3:04 PM  DG Chest 2 View  Result Date: 03/12/2023 CLINICAL DATA:  Shortness of breath, COPD, asthma, chronic kidney disease EXAM: CHEST - 2 VIEW COMPARISON:  12/27/2022 FINDINGS: Normal heart size post coronary stenting and aortic stenting. Mediastinal contours and pulmonary vascularity normal. Emphysematous changes with bibasilar small pleural effusions and RIGHT basilar atelectasis. Skin fold projects over LEFT chest. No acute infiltrate or pneumothorax. Bones demineralized. IMPRESSION: COPD changes with small bibasilar  effusions and RIGHT basilar atelectasis. Emphysema (ICD10-J43.9). Electronically Signed   By: Ulyses Southward M.D.   On: 03/12/2023 10:30   HYBRID OR IMAGING ([facility] ONLY)  Result Date: 03/11/2023 There is no interpretation for this exam.  This order is for images obtained during a surgical procedure.  Please See "Surgeries" Tab for more information regarding the procedure.   MYOCARDIAL PERFUSION IMAGING  Result Date: 02/26/2023   The study is normal. The study is low risk.  Left ventricular function is normal. Nuclear stress EF: 65 %. The left ventricular ejection fraction is normal (55-65%). End diastolic cavity size is normal.    Microbiology: Results for orders placed or performed during the hospital encounter of 03/07/23  Surgical pcr screen     Status: None   Collection Time: 03/07/23  9:54 AM   Specimen: Nasal Mucosa; Nasal Swab  Result Value Ref Range Status   MRSA, PCR NEGATIVE NEGATIVE Final   Staphylococcus aureus NEGATIVE NEGATIVE Final    Comment: (NOTE) The Xpert SA Assay (FDA approved for NASAL specimens in patients 52 years of age and older), is one component of a comprehensive surveillance program. It is not intended to diagnose infection nor to guide or monitor treatment. Performed at [facility], [address] [place], [place] [address]     Labs: CBC: Recent Labs  Lab 03/26/23 2235 03/28/23 0547  WBC 10.3 6.4  NEUTROABS 7.7  --   HGB 8.4* 8.5*  HCT 26.8* 26.0*  MCV 100.8* 97.4  PLT 428* 374   Basic Metabolic Panel: Recent Labs  Lab 03/26/23 2235 03/27/23 0117 03/28/23 0547  NA 133*  --  134*  K 4.2  --  4.2  CL 100  --  101  CO2 25  --  27  GLUCOSE 106*  --  133*  BUN 34*  --  21  CREATININE 0.80  --  0.79  CALCIUM 8.2*  --  8.5*  MG  --  2.0 1.9  PHOS  --  3.1 3.8   Liver Function Tests: Recent Labs  Lab 03/26/23 2235  AST 20  ALT 20  ALKPHOS 96  BILITOT 0.4  PROT 5.8*  ALBUMIN 2.6*   CBG: Recent Labs  Lab 03/27/23 1654  03/27/23 1931 03/27/23 2353 03/28/23 0401 03/28/23 0738  GLUCAP 86 102* 125* 132* 144*    Discharge time spent: greater than 30 minutes.  Signed: Pennie Banter, DO [facility] 03/28/2023

## 2023-03-28 NOTE — Plan of Care (Signed)
  Problem: Education: Goal: Knowledge of discharge needs will improve Outcome: Adequate for Discharge   Problem: Clinical Measurements: Goal: Postoperative complications will be avoided or minimized Outcome: Adequate for Discharge   Problem: Respiratory: Goal: Ability to achieve and maintain a regular respiratory rate will improve Outcome: Adequate for Discharge   Problem: Skin Integrity: Goal: Demonstration of wound healing without infection will improve Outcome: Adequate for Discharge   Problem: Education: Goal: Knowledge of condition and prescribed therapy will improve Outcome: Adequate for Discharge   Problem: Cardiac: Goal: Will achieve and/or maintain adequate cardiac output Outcome: Adequate for Discharge   Problem: Physical Regulation: Goal: Complications related to the disease process, condition or treatment will be avoided or minimized Outcome: Adequate for Discharge   Problem: Education: Goal: Knowledge of General Education information will improve Description: Including pain rating scale, medication(s)/side effects and non-pharmacologic comfort measures Outcome: Adequate for Discharge   Problem: Health Behavior/Discharge Planning: Goal: Ability to manage health-related needs will improve Outcome: Adequate for Discharge   Problem: Clinical Measurements: Goal: Ability to maintain clinical measurements within normal limits will improve Outcome: Adequate for Discharge Goal: Will remain free from infection Outcome: Adequate for Discharge Goal: Diagnostic test results will improve Outcome: Adequate for Discharge Goal: Respiratory complications will improve Outcome: Adequate for Discharge Goal: Cardiovascular complication will be avoided Outcome: Adequate for Discharge   Problem: Activity: Goal: Risk for activity intolerance will decrease Outcome: Adequate for Discharge   Problem: Nutrition: Goal: Adequate nutrition will be maintained Outcome: Adequate for  Discharge   Problem: Coping: Goal: Level of anxiety will decrease Outcome: Adequate for Discharge   Problem: Elimination: Goal: Will not experience complications related to bowel motility Outcome: Adequate for Discharge Goal: Will not experience complications related to urinary retention Outcome: Adequate for Discharge   Problem: Pain Managment: Goal: General experience of comfort will improve Outcome: Adequate for Discharge   Problem: Safety: Goal: Ability to remain free from injury will improve Outcome: Adequate for Discharge   Problem: Skin Integrity: Goal: Risk for impaired skin integrity will decrease Outcome: Adequate for Discharge

## 2023-03-28 NOTE — Progress Notes (Addendum)
  Progress Note    03/28/2023 7:44 AM * No surgery found *  Subjective:  Feels much better and wants to go home   Vitals:   03/28/23 0403 03/28/23 0737  BP: 103/71 132/60  Pulse: 77 68  Resp: 16 18  Temp: 98.3 F (36.8 C) 98 F (36.7 C)  SpO2: 97% 97%   Physical Exam: Lungs:  non labored Incisions:  R neck healing well Extremities:  palpable radial pulses Neurologic: A&O  CBC    Component Value Date/Time   WBC 6.4 03/28/2023 0547   RBC 2.67 (L) 03/28/2023 0547   HGB 8.5 (L) 03/28/2023 0547   HGB 11.1 (L) 01/31/2022 0920   HCT 26.0 (L) 03/28/2023 0547   HCT 32.7 (L) 01/31/2022 0920   PLT 374 03/28/2023 0547   PLT 262 01/31/2022 0920   MCV 97.4 03/28/2023 0547   MCV 96 01/31/2022 0920   MCV 95 (A) 02/27/2021 0000   MCH 31.8 03/28/2023 0547   MCHC 32.7 03/28/2023 0547   RDW 14.1 03/28/2023 0547   RDW 11.8 01/31/2022 0920   LYMPHSABS 1.2 03/26/2023 2235   LYMPHSABS 1.5 01/12/2020 1005   MONOABS 1.1 (H) 03/26/2023 2235   EOSABS 0.2 03/26/2023 2235   EOSABS 0.2 01/12/2020 1005   BASOSABS 0.0 03/26/2023 2235   BASOSABS 0.0 01/12/2020 1005    BMET    Component Value Date/Time   NA 134 (L) 03/28/2023 0547   NA 136 02/07/2023 1007   K 4.2 03/28/2023 0547   CL 101 03/28/2023 0547   CO2 27 03/28/2023 0547   GLUCOSE 133 (H) 03/28/2023 0547   BUN 21 03/28/2023 0547   BUN 27 02/07/2023 1007   CREATININE 0.79 03/28/2023 0547   CREATININE 1.26 (H) 12/20/2022 0949   CALCIUM 8.5 (L) 03/28/2023 0547   GFRNONAA >60 03/28/2023 0547   GFRNONAA 57 (L) 12/20/2022 0949   GFRAA 76 11/07/2020 1442    INR    Component Value Date/Time   INR 1.2 03/11/2023 1742     Intake/Output Summary (Last 24 hours) at 03/28/2023 0744 Last data filed at 03/28/2023 0734 Gross per 24 hour  Intake 960 ml  Output 1450 ml  Net -490 ml     Assessment/Plan:  82 y.o. 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  Nothing to add from a vascular standpoint.  He can follow up as scheduled next month when cleared medically.    Emilie Rutter, PA-C [facility] [phone] 03/28/2023 7:44 AM   I have independently interviewed and examined patient and agree with PA assessment and plan above.   Doyce Stonehouse C. Randie Heinz, MD [facility] [place] Office: [phone] Pager: [phone]

## 2023-03-29 ENCOUNTER — Other Ambulatory Visit: Payer: Self-pay | Admitting: Cardiology

## 2023-03-29 DIAGNOSIS — R1310 Dysphagia, unspecified: Secondary | ICD-10-CM | POA: Diagnosis not present

## 2023-03-30 DIAGNOSIS — R1310 Dysphagia, unspecified: Secondary | ICD-10-CM | POA: Diagnosis not present

## 2023-03-31 DIAGNOSIS — R1310 Dysphagia, unspecified: Secondary | ICD-10-CM | POA: Diagnosis not present

## 2023-04-01 ENCOUNTER — Telehealth: Payer: Self-pay | Admitting: *Deleted

## 2023-04-01 DIAGNOSIS — E43 Unspecified severe protein-calorie malnutrition: Secondary | ICD-10-CM | POA: Diagnosis not present

## 2023-04-01 DIAGNOSIS — Z48812 Encounter for surgical aftercare following surgery on the circulatory system: Secondary | ICD-10-CM | POA: Diagnosis not present

## 2023-04-01 DIAGNOSIS — J4489 Other specified chronic obstructive pulmonary disease: Secondary | ICD-10-CM | POA: Diagnosis not present

## 2023-04-01 DIAGNOSIS — N189 Chronic kidney disease, unspecified: Secondary | ICD-10-CM | POA: Diagnosis not present

## 2023-04-01 DIAGNOSIS — Q2549 Other congenital malformations of aorta: Secondary | ICD-10-CM | POA: Diagnosis not present

## 2023-04-01 DIAGNOSIS — I129 Hypertensive chronic kidney disease with stage 1 through stage 4 chronic kidney disease, or unspecified chronic kidney disease: Secondary | ICD-10-CM | POA: Diagnosis not present

## 2023-04-01 DIAGNOSIS — D631 Anemia in chronic kidney disease: Secondary | ICD-10-CM | POA: Diagnosis not present

## 2023-04-01 DIAGNOSIS — Z431 Encounter for attention to gastrostomy: Secondary | ICD-10-CM | POA: Diagnosis not present

## 2023-04-01 DIAGNOSIS — I2510 Atherosclerotic heart disease of native coronary artery without angina pectoris: Secondary | ICD-10-CM | POA: Diagnosis not present

## 2023-04-01 DIAGNOSIS — R1310 Dysphagia, unspecified: Secondary | ICD-10-CM | POA: Diagnosis not present

## 2023-04-01 NOTE — Progress Notes (Signed)
  Care Coordination   Note   04/01/2023 Name: [name] [name] MRN: [id number] DOB: 1940/12/09  Brian Dunn is a 82 y.o. year old male who sees [facility] for primary care. I reached out to [facility] by phone today to offer care coordination services.  [name] was given information about Care Coordination services today including:   The Care Coordination services include support from the care team which includes your Nurse Coordinator, Clinical Social Worker, or Pharmacist.  The Care Coordination team is here to help remove barriers to the health concerns and goals most important to you. Care Coordination services are voluntary, and the patient may decline or stop services at any time by request to their care team member.   Care Coordination Consent Status: Patient agreed to services and verbal consent obtained.   Follow up plan:  Telephone appointment with care coordination team member scheduled for:  04/14/2023  Encounter Outcome:  Pt. Scheduled  Burman Nieves, CCMA Care Coordination Care Guide Direct Dial: [phone]

## 2023-04-02 ENCOUNTER — Encounter (HOSPITAL_COMMUNITY): Payer: Self-pay | Admitting: Vascular Surgery

## 2023-04-02 DIAGNOSIS — R1310 Dysphagia, unspecified: Secondary | ICD-10-CM | POA: Diagnosis not present

## 2023-04-03 DIAGNOSIS — R1310 Dysphagia, unspecified: Secondary | ICD-10-CM | POA: Diagnosis not present

## 2023-04-04 DIAGNOSIS — R1310 Dysphagia, unspecified: Secondary | ICD-10-CM | POA: Diagnosis not present

## 2023-04-05 DIAGNOSIS — R1310 Dysphagia, unspecified: Secondary | ICD-10-CM | POA: Diagnosis not present

## 2023-04-06 DIAGNOSIS — R1310 Dysphagia, unspecified: Secondary | ICD-10-CM | POA: Diagnosis not present

## 2023-04-07 DIAGNOSIS — R1310 Dysphagia, unspecified: Secondary | ICD-10-CM | POA: Diagnosis not present

## 2023-04-08 DIAGNOSIS — D631 Anemia in chronic kidney disease: Secondary | ICD-10-CM | POA: Diagnosis not present

## 2023-04-08 DIAGNOSIS — R1310 Dysphagia, unspecified: Secondary | ICD-10-CM | POA: Diagnosis not present

## 2023-04-08 DIAGNOSIS — Q2549 Other congenital malformations of aorta: Secondary | ICD-10-CM | POA: Diagnosis not present

## 2023-04-08 DIAGNOSIS — I2510 Atherosclerotic heart disease of native coronary artery without angina pectoris: Secondary | ICD-10-CM | POA: Diagnosis not present

## 2023-04-08 DIAGNOSIS — Z431 Encounter for attention to gastrostomy: Secondary | ICD-10-CM | POA: Diagnosis not present

## 2023-04-08 DIAGNOSIS — Z48812 Encounter for surgical aftercare following surgery on the circulatory system: Secondary | ICD-10-CM | POA: Diagnosis not present

## 2023-04-08 DIAGNOSIS — I129 Hypertensive chronic kidney disease with stage 1 through stage 4 chronic kidney disease, or unspecified chronic kidney disease: Secondary | ICD-10-CM | POA: Diagnosis not present

## 2023-04-08 DIAGNOSIS — N189 Chronic kidney disease, unspecified: Secondary | ICD-10-CM | POA: Diagnosis not present

## 2023-04-08 DIAGNOSIS — J4489 Other specified chronic obstructive pulmonary disease: Secondary | ICD-10-CM | POA: Diagnosis not present

## 2023-04-08 DIAGNOSIS — E43 Unspecified severe protein-calorie malnutrition: Secondary | ICD-10-CM | POA: Diagnosis not present

## 2023-04-09 DIAGNOSIS — R1310 Dysphagia, unspecified: Secondary | ICD-10-CM | POA: Diagnosis not present

## 2023-04-09 DIAGNOSIS — J3801 Paralysis of vocal cords and larynx, unilateral: Secondary | ICD-10-CM | POA: Diagnosis not present

## 2023-04-09 DIAGNOSIS — R1314 Dysphagia, pharyngoesophageal phase: Secondary | ICD-10-CM | POA: Diagnosis not present

## 2023-04-10 DIAGNOSIS — R1310 Dysphagia, unspecified: Secondary | ICD-10-CM | POA: Diagnosis not present

## 2023-04-11 DIAGNOSIS — N189 Chronic kidney disease, unspecified: Secondary | ICD-10-CM | POA: Diagnosis not present

## 2023-04-11 DIAGNOSIS — J4489 Other specified chronic obstructive pulmonary disease: Secondary | ICD-10-CM | POA: Diagnosis not present

## 2023-04-11 DIAGNOSIS — Z431 Encounter for attention to gastrostomy: Secondary | ICD-10-CM | POA: Diagnosis not present

## 2023-04-11 DIAGNOSIS — Z48812 Encounter for surgical aftercare following surgery on the circulatory system: Secondary | ICD-10-CM | POA: Diagnosis not present

## 2023-04-11 DIAGNOSIS — I2510 Atherosclerotic heart disease of native coronary artery without angina pectoris: Secondary | ICD-10-CM | POA: Diagnosis not present

## 2023-04-11 DIAGNOSIS — R1310 Dysphagia, unspecified: Secondary | ICD-10-CM | POA: Diagnosis not present

## 2023-04-11 DIAGNOSIS — E43 Unspecified severe protein-calorie malnutrition: Secondary | ICD-10-CM | POA: Diagnosis not present

## 2023-04-11 DIAGNOSIS — I129 Hypertensive chronic kidney disease with stage 1 through stage 4 chronic kidney disease, or unspecified chronic kidney disease: Secondary | ICD-10-CM | POA: Diagnosis not present

## 2023-04-11 DIAGNOSIS — Q2549 Other congenital malformations of aorta: Secondary | ICD-10-CM | POA: Diagnosis not present

## 2023-04-11 DIAGNOSIS — D631 Anemia in chronic kidney disease: Secondary | ICD-10-CM | POA: Diagnosis not present

## 2023-04-12 DIAGNOSIS — R1310 Dysphagia, unspecified: Secondary | ICD-10-CM | POA: Diagnosis not present

## 2023-04-13 DIAGNOSIS — R1310 Dysphagia, unspecified: Secondary | ICD-10-CM | POA: Diagnosis not present

## 2023-04-14 ENCOUNTER — Ambulatory Visit: Payer: Self-pay | Admitting: *Deleted

## 2023-04-14 ENCOUNTER — Encounter (HOSPITAL_COMMUNITY): Payer: Self-pay | Admitting: Vascular Surgery

## 2023-04-14 DIAGNOSIS — R1310 Dysphagia, unspecified: Secondary | ICD-10-CM | POA: Diagnosis not present

## 2023-04-14 NOTE — Patient Outreach (Signed)
  Care Coordination   Initial Visit Note   04/14/2023 Name: Brian Dunn MRN: [id number] DOB: Jul 23, 1941  Brian Dunn is a 82 y.o. year old male who sees [facility] for primary care. I spoke with  Brian Dunn by phone today.  What matters to the patients health and wellness today?  Pt reports "I just want to get well". Pt denies any mental health concerns/needs; stating, he only anxious to get well (from his current vocal cord issues). Denies further needs for CSW and Care Coordination at this time.    Goals Addressed   None     SDOH assessments and interventions completed:  Yes  SDOH Interventions Today    Flowsheet Row Most Recent Value  SDOH Interventions   Stress Interventions Intervention Not Indicated, Other (Comment)  [Pt reports wanting to get better- otherwise he denies and declines mental health needs.]        Care Coordination Interventions:  No, not indicated   Follow up plan: No further intervention required.   Encounter Outcome:  Pt. Visit Completed

## 2023-04-15 DIAGNOSIS — I129 Hypertensive chronic kidney disease with stage 1 through stage 4 chronic kidney disease, or unspecified chronic kidney disease: Secondary | ICD-10-CM | POA: Diagnosis not present

## 2023-04-15 DIAGNOSIS — Q2549 Other congenital malformations of aorta: Secondary | ICD-10-CM | POA: Diagnosis not present

## 2023-04-15 DIAGNOSIS — E43 Unspecified severe protein-calorie malnutrition: Secondary | ICD-10-CM | POA: Diagnosis not present

## 2023-04-15 DIAGNOSIS — Z48812 Encounter for surgical aftercare following surgery on the circulatory system: Secondary | ICD-10-CM | POA: Diagnosis not present

## 2023-04-15 DIAGNOSIS — I2510 Atherosclerotic heart disease of native coronary artery without angina pectoris: Secondary | ICD-10-CM | POA: Diagnosis not present

## 2023-04-15 DIAGNOSIS — R1310 Dysphagia, unspecified: Secondary | ICD-10-CM | POA: Diagnosis not present

## 2023-04-15 DIAGNOSIS — J4489 Other specified chronic obstructive pulmonary disease: Secondary | ICD-10-CM | POA: Diagnosis not present

## 2023-04-15 DIAGNOSIS — D631 Anemia in chronic kidney disease: Secondary | ICD-10-CM | POA: Diagnosis not present

## 2023-04-15 DIAGNOSIS — Z431 Encounter for attention to gastrostomy: Secondary | ICD-10-CM | POA: Diagnosis not present

## 2023-04-15 DIAGNOSIS — N189 Chronic kidney disease, unspecified: Secondary | ICD-10-CM | POA: Diagnosis not present

## 2023-04-16 DIAGNOSIS — Z931 Gastrostomy status: Secondary | ICD-10-CM | POA: Diagnosis not present

## 2023-04-16 DIAGNOSIS — D62 Acute posthemorrhagic anemia: Secondary | ICD-10-CM | POA: Diagnosis not present

## 2023-04-16 DIAGNOSIS — R1310 Dysphagia, unspecified: Secondary | ICD-10-CM | POA: Diagnosis not present

## 2023-04-16 DIAGNOSIS — R627 Adult failure to thrive: Secondary | ICD-10-CM | POA: Diagnosis not present

## 2023-04-16 DIAGNOSIS — Z681 Body mass index (BMI) 19 or less, adult: Secondary | ICD-10-CM | POA: Diagnosis not present

## 2023-04-17 DIAGNOSIS — Z48812 Encounter for surgical aftercare following surgery on the circulatory system: Secondary | ICD-10-CM | POA: Diagnosis not present

## 2023-04-17 DIAGNOSIS — I2510 Atherosclerotic heart disease of native coronary artery without angina pectoris: Secondary | ICD-10-CM | POA: Diagnosis not present

## 2023-04-17 DIAGNOSIS — E43 Unspecified severe protein-calorie malnutrition: Secondary | ICD-10-CM | POA: Diagnosis not present

## 2023-04-17 DIAGNOSIS — D631 Anemia in chronic kidney disease: Secondary | ICD-10-CM | POA: Diagnosis not present

## 2023-04-17 DIAGNOSIS — N189 Chronic kidney disease, unspecified: Secondary | ICD-10-CM | POA: Diagnosis not present

## 2023-04-17 DIAGNOSIS — Q2549 Other congenital malformations of aorta: Secondary | ICD-10-CM | POA: Diagnosis not present

## 2023-04-17 DIAGNOSIS — Z431 Encounter for attention to gastrostomy: Secondary | ICD-10-CM | POA: Diagnosis not present

## 2023-04-17 DIAGNOSIS — J4489 Other specified chronic obstructive pulmonary disease: Secondary | ICD-10-CM | POA: Diagnosis not present

## 2023-04-17 DIAGNOSIS — I129 Hypertensive chronic kidney disease with stage 1 through stage 4 chronic kidney disease, or unspecified chronic kidney disease: Secondary | ICD-10-CM | POA: Diagnosis not present

## 2023-04-17 DIAGNOSIS — R1310 Dysphagia, unspecified: Secondary | ICD-10-CM | POA: Diagnosis not present

## 2023-04-18 DIAGNOSIS — E43 Unspecified severe protein-calorie malnutrition: Secondary | ICD-10-CM | POA: Diagnosis not present

## 2023-04-18 DIAGNOSIS — Z48812 Encounter for surgical aftercare following surgery on the circulatory system: Secondary | ICD-10-CM | POA: Diagnosis not present

## 2023-04-18 DIAGNOSIS — Q2549 Other congenital malformations of aorta: Secondary | ICD-10-CM | POA: Diagnosis not present

## 2023-04-18 DIAGNOSIS — Z431 Encounter for attention to gastrostomy: Secondary | ICD-10-CM | POA: Diagnosis not present

## 2023-04-18 DIAGNOSIS — D631 Anemia in chronic kidney disease: Secondary | ICD-10-CM | POA: Diagnosis not present

## 2023-04-18 DIAGNOSIS — J4489 Other specified chronic obstructive pulmonary disease: Secondary | ICD-10-CM | POA: Diagnosis not present

## 2023-04-18 DIAGNOSIS — R1310 Dysphagia, unspecified: Secondary | ICD-10-CM | POA: Diagnosis not present

## 2023-04-18 DIAGNOSIS — N189 Chronic kidney disease, unspecified: Secondary | ICD-10-CM | POA: Diagnosis not present

## 2023-04-18 DIAGNOSIS — I2510 Atherosclerotic heart disease of native coronary artery without angina pectoris: Secondary | ICD-10-CM | POA: Diagnosis not present

## 2023-04-18 DIAGNOSIS — I129 Hypertensive chronic kidney disease with stage 1 through stage 4 chronic kidney disease, or unspecified chronic kidney disease: Secondary | ICD-10-CM | POA: Diagnosis not present

## 2023-04-19 DIAGNOSIS — R1310 Dysphagia, unspecified: Secondary | ICD-10-CM | POA: Diagnosis not present

## 2023-04-22 DIAGNOSIS — Z431 Encounter for attention to gastrostomy: Secondary | ICD-10-CM | POA: Diagnosis not present

## 2023-04-22 DIAGNOSIS — I129 Hypertensive chronic kidney disease with stage 1 through stage 4 chronic kidney disease, or unspecified chronic kidney disease: Secondary | ICD-10-CM | POA: Diagnosis not present

## 2023-04-22 DIAGNOSIS — J4489 Other specified chronic obstructive pulmonary disease: Secondary | ICD-10-CM | POA: Diagnosis not present

## 2023-04-22 DIAGNOSIS — R1310 Dysphagia, unspecified: Secondary | ICD-10-CM | POA: Diagnosis not present

## 2023-04-22 DIAGNOSIS — D631 Anemia in chronic kidney disease: Secondary | ICD-10-CM | POA: Diagnosis not present

## 2023-04-22 DIAGNOSIS — Z48812 Encounter for surgical aftercare following surgery on the circulatory system: Secondary | ICD-10-CM | POA: Diagnosis not present

## 2023-04-22 DIAGNOSIS — N189 Chronic kidney disease, unspecified: Secondary | ICD-10-CM | POA: Diagnosis not present

## 2023-04-22 DIAGNOSIS — I2510 Atherosclerotic heart disease of native coronary artery without angina pectoris: Secondary | ICD-10-CM | POA: Diagnosis not present

## 2023-04-22 DIAGNOSIS — E43 Unspecified severe protein-calorie malnutrition: Secondary | ICD-10-CM | POA: Diagnosis not present

## 2023-04-22 DIAGNOSIS — Q2549 Other congenital malformations of aorta: Secondary | ICD-10-CM | POA: Diagnosis not present

## 2023-04-24 DIAGNOSIS — R911 Solitary pulmonary nodule: Secondary | ICD-10-CM | POA: Diagnosis not present

## 2023-04-24 DIAGNOSIS — Q2549 Other congenital malformations of aorta: Secondary | ICD-10-CM | POA: Diagnosis not present

## 2023-04-24 DIAGNOSIS — N189 Chronic kidney disease, unspecified: Secondary | ICD-10-CM | POA: Diagnosis not present

## 2023-04-24 DIAGNOSIS — I129 Hypertensive chronic kidney disease with stage 1 through stage 4 chronic kidney disease, or unspecified chronic kidney disease: Secondary | ICD-10-CM | POA: Diagnosis not present

## 2023-04-24 DIAGNOSIS — Z955 Presence of coronary angioplasty implant and graft: Secondary | ICD-10-CM | POA: Diagnosis not present

## 2023-04-24 DIAGNOSIS — Z79891 Long term (current) use of opiate analgesic: Secondary | ICD-10-CM | POA: Diagnosis not present

## 2023-04-24 DIAGNOSIS — A498 Other bacterial infections of unspecified site: Secondary | ICD-10-CM | POA: Diagnosis not present

## 2023-04-24 DIAGNOSIS — E43 Unspecified severe protein-calorie malnutrition: Secondary | ICD-10-CM | POA: Diagnosis not present

## 2023-04-24 DIAGNOSIS — I2510 Atherosclerotic heart disease of native coronary artery without angina pectoris: Secondary | ICD-10-CM | POA: Diagnosis not present

## 2023-04-24 DIAGNOSIS — D631 Anemia in chronic kidney disease: Secondary | ICD-10-CM | POA: Diagnosis not present

## 2023-04-24 DIAGNOSIS — H903 Sensorineural hearing loss, bilateral: Secondary | ICD-10-CM | POA: Diagnosis not present

## 2023-04-24 DIAGNOSIS — K219 Gastro-esophageal reflux disease without esophagitis: Secondary | ICD-10-CM | POA: Diagnosis not present

## 2023-04-24 DIAGNOSIS — E785 Hyperlipidemia, unspecified: Secondary | ICD-10-CM | POA: Diagnosis not present

## 2023-04-24 DIAGNOSIS — M109 Gout, unspecified: Secondary | ICD-10-CM | POA: Diagnosis not present

## 2023-04-24 DIAGNOSIS — Z681 Body mass index (BMI) 19 or less, adult: Secondary | ICD-10-CM | POA: Diagnosis not present

## 2023-04-24 DIAGNOSIS — J4489 Other specified chronic obstructive pulmonary disease: Secondary | ICD-10-CM | POA: Diagnosis not present

## 2023-04-24 DIAGNOSIS — M8580 Other specified disorders of bone density and structure, unspecified site: Secondary | ICD-10-CM | POA: Diagnosis not present

## 2023-04-24 DIAGNOSIS — Z431 Encounter for attention to gastrostomy: Secondary | ICD-10-CM | POA: Diagnosis not present

## 2023-04-24 DIAGNOSIS — Z48812 Encounter for surgical aftercare following surgery on the circulatory system: Secondary | ICD-10-CM | POA: Diagnosis not present

## 2023-04-24 DIAGNOSIS — Z79899 Other long term (current) drug therapy: Secondary | ICD-10-CM | POA: Diagnosis not present

## 2023-04-24 DIAGNOSIS — Z7982 Long term (current) use of aspirin: Secondary | ICD-10-CM | POA: Diagnosis not present

## 2023-04-24 DIAGNOSIS — G250 Essential tremor: Secondary | ICD-10-CM | POA: Diagnosis not present

## 2023-04-24 DIAGNOSIS — Z8701 Personal history of pneumonia (recurrent): Secondary | ICD-10-CM | POA: Diagnosis not present

## 2023-04-24 DIAGNOSIS — Z7951 Long term (current) use of inhaled steroids: Secondary | ICD-10-CM | POA: Diagnosis not present

## 2023-04-24 DIAGNOSIS — R1310 Dysphagia, unspecified: Secondary | ICD-10-CM | POA: Diagnosis not present

## 2023-04-25 DIAGNOSIS — M8580 Other specified disorders of bone density and structure, unspecified site: Secondary | ICD-10-CM | POA: Diagnosis not present

## 2023-04-25 DIAGNOSIS — J4489 Other specified chronic obstructive pulmonary disease: Secondary | ICD-10-CM | POA: Diagnosis not present

## 2023-04-25 DIAGNOSIS — E785 Hyperlipidemia, unspecified: Secondary | ICD-10-CM | POA: Diagnosis not present

## 2023-04-25 DIAGNOSIS — R911 Solitary pulmonary nodule: Secondary | ICD-10-CM | POA: Diagnosis not present

## 2023-04-25 DIAGNOSIS — Z431 Encounter for attention to gastrostomy: Secondary | ICD-10-CM | POA: Diagnosis not present

## 2023-04-25 DIAGNOSIS — Z681 Body mass index (BMI) 19 or less, adult: Secondary | ICD-10-CM | POA: Diagnosis not present

## 2023-04-25 DIAGNOSIS — Z7951 Long term (current) use of inhaled steroids: Secondary | ICD-10-CM | POA: Diagnosis not present

## 2023-04-25 DIAGNOSIS — I129 Hypertensive chronic kidney disease with stage 1 through stage 4 chronic kidney disease, or unspecified chronic kidney disease: Secondary | ICD-10-CM | POA: Diagnosis not present

## 2023-04-25 DIAGNOSIS — Z7982 Long term (current) use of aspirin: Secondary | ICD-10-CM | POA: Diagnosis not present

## 2023-04-25 DIAGNOSIS — R1310 Dysphagia, unspecified: Secondary | ICD-10-CM | POA: Diagnosis not present

## 2023-04-25 DIAGNOSIS — Z79891 Long term (current) use of opiate analgesic: Secondary | ICD-10-CM | POA: Diagnosis not present

## 2023-04-25 DIAGNOSIS — Z48812 Encounter for surgical aftercare following surgery on the circulatory system: Secondary | ICD-10-CM | POA: Diagnosis not present

## 2023-04-25 DIAGNOSIS — Z79899 Other long term (current) drug therapy: Secondary | ICD-10-CM | POA: Diagnosis not present

## 2023-04-25 DIAGNOSIS — E43 Unspecified severe protein-calorie malnutrition: Secondary | ICD-10-CM | POA: Diagnosis not present

## 2023-04-25 DIAGNOSIS — G250 Essential tremor: Secondary | ICD-10-CM | POA: Diagnosis not present

## 2023-04-25 DIAGNOSIS — M109 Gout, unspecified: Secondary | ICD-10-CM | POA: Diagnosis not present

## 2023-04-25 DIAGNOSIS — Z955 Presence of coronary angioplasty implant and graft: Secondary | ICD-10-CM | POA: Diagnosis not present

## 2023-04-25 DIAGNOSIS — I2510 Atherosclerotic heart disease of native coronary artery without angina pectoris: Secondary | ICD-10-CM | POA: Diagnosis not present

## 2023-04-25 DIAGNOSIS — D631 Anemia in chronic kidney disease: Secondary | ICD-10-CM | POA: Diagnosis not present

## 2023-04-25 DIAGNOSIS — H903 Sensorineural hearing loss, bilateral: Secondary | ICD-10-CM | POA: Diagnosis not present

## 2023-04-25 DIAGNOSIS — Q2549 Other congenital malformations of aorta: Secondary | ICD-10-CM | POA: Diagnosis not present

## 2023-04-25 DIAGNOSIS — Z8701 Personal history of pneumonia (recurrent): Secondary | ICD-10-CM | POA: Diagnosis not present

## 2023-04-25 DIAGNOSIS — K219 Gastro-esophageal reflux disease without esophagitis: Secondary | ICD-10-CM | POA: Diagnosis not present

## 2023-04-25 DIAGNOSIS — N189 Chronic kidney disease, unspecified: Secondary | ICD-10-CM | POA: Diagnosis not present

## 2023-04-28 DIAGNOSIS — R1310 Dysphagia, unspecified: Secondary | ICD-10-CM | POA: Diagnosis not present

## 2023-04-29 DIAGNOSIS — Z955 Presence of coronary angioplasty implant and graft: Secondary | ICD-10-CM | POA: Diagnosis not present

## 2023-04-29 DIAGNOSIS — Z681 Body mass index (BMI) 19 or less, adult: Secondary | ICD-10-CM | POA: Diagnosis not present

## 2023-04-29 DIAGNOSIS — N189 Chronic kidney disease, unspecified: Secondary | ICD-10-CM | POA: Diagnosis not present

## 2023-04-29 DIAGNOSIS — M8580 Other specified disorders of bone density and structure, unspecified site: Secondary | ICD-10-CM | POA: Diagnosis not present

## 2023-04-29 DIAGNOSIS — Q2549 Other congenital malformations of aorta: Secondary | ICD-10-CM | POA: Diagnosis not present

## 2023-04-29 DIAGNOSIS — Z431 Encounter for attention to gastrostomy: Secondary | ICD-10-CM | POA: Diagnosis not present

## 2023-04-29 DIAGNOSIS — Z8701 Personal history of pneumonia (recurrent): Secondary | ICD-10-CM | POA: Diagnosis not present

## 2023-04-29 DIAGNOSIS — Z7951 Long term (current) use of inhaled steroids: Secondary | ICD-10-CM | POA: Diagnosis not present

## 2023-04-29 DIAGNOSIS — H903 Sensorineural hearing loss, bilateral: Secondary | ICD-10-CM | POA: Diagnosis not present

## 2023-04-29 DIAGNOSIS — Z79899 Other long term (current) drug therapy: Secondary | ICD-10-CM | POA: Diagnosis not present

## 2023-04-29 DIAGNOSIS — Z48812 Encounter for surgical aftercare following surgery on the circulatory system: Secondary | ICD-10-CM | POA: Diagnosis not present

## 2023-04-29 DIAGNOSIS — J4489 Other specified chronic obstructive pulmonary disease: Secondary | ICD-10-CM | POA: Diagnosis not present

## 2023-04-29 DIAGNOSIS — R1310 Dysphagia, unspecified: Secondary | ICD-10-CM | POA: Diagnosis not present

## 2023-04-29 DIAGNOSIS — E785 Hyperlipidemia, unspecified: Secondary | ICD-10-CM | POA: Diagnosis not present

## 2023-04-29 DIAGNOSIS — D631 Anemia in chronic kidney disease: Secondary | ICD-10-CM | POA: Diagnosis not present

## 2023-04-29 DIAGNOSIS — R911 Solitary pulmonary nodule: Secondary | ICD-10-CM | POA: Diagnosis not present

## 2023-04-29 DIAGNOSIS — Z79891 Long term (current) use of opiate analgesic: Secondary | ICD-10-CM | POA: Diagnosis not present

## 2023-04-29 DIAGNOSIS — G250 Essential tremor: Secondary | ICD-10-CM | POA: Diagnosis not present

## 2023-04-29 DIAGNOSIS — M109 Gout, unspecified: Secondary | ICD-10-CM | POA: Diagnosis not present

## 2023-04-29 DIAGNOSIS — Z7982 Long term (current) use of aspirin: Secondary | ICD-10-CM | POA: Diagnosis not present

## 2023-04-29 DIAGNOSIS — K219 Gastro-esophageal reflux disease without esophagitis: Secondary | ICD-10-CM | POA: Diagnosis not present

## 2023-04-29 DIAGNOSIS — I2510 Atherosclerotic heart disease of native coronary artery without angina pectoris: Secondary | ICD-10-CM | POA: Diagnosis not present

## 2023-04-29 DIAGNOSIS — E43 Unspecified severe protein-calorie malnutrition: Secondary | ICD-10-CM | POA: Diagnosis not present

## 2023-04-29 DIAGNOSIS — I129 Hypertensive chronic kidney disease with stage 1 through stage 4 chronic kidney disease, or unspecified chronic kidney disease: Secondary | ICD-10-CM | POA: Diagnosis not present

## 2023-04-30 ENCOUNTER — Ambulatory Visit (INDEPENDENT_AMBULATORY_CARE_PROVIDER_SITE_OTHER): Payer: Medicare HMO | Admitting: Vascular Surgery

## 2023-04-30 ENCOUNTER — Encounter: Payer: Self-pay | Admitting: Vascular Surgery

## 2023-04-30 VITALS — BP 119/58 | HR 74 | Temp 97.9°F | Resp 20 | Ht 70.0 in

## 2023-04-30 DIAGNOSIS — Q2549 Other congenital malformations of aorta: Secondary | ICD-10-CM

## 2023-04-30 NOTE — Progress Notes (Signed)
     Subjective:     Patient ID: Brian Dunn, male   DOB: 02/22/1941, 82 y.o.   MRN: [id number]  HPI 82 year old male follows up from transposition of the subclavian artery to right common carotid artery and TEVAR for what was initially thought to be symptomatic Kommerell diverticulum.  Patient has not had surgery has had issues with inability to swallow and even was completed.  Not able to maintain breath and had his right vocal cord injected at [facility] without disease speak.  He is only able to take ice chips and all of his calories are coming from feeding tube at this time.  Overall patient states that he wishes he would have not had surgery and states that that would probably be better.   Review of Systems As above    Objective:   Physical Exam Vitals:   04/30/23 1052  BP: (!) 119/58  Pulse: 74  Resp: 20  Temp: 97.9 F (36.6 C)  SpO2: 97%    Awake alert and oriented Emaciated Right neck incision healing well Palpable right radial pulse      Assessment:     82 year old male status post above-noted procedure with complicated postop course unfortunately appears to have had some amount of vagus nerve injury now status post vocal cord injection and also remains intolerant of all food worse than preoperative following with speech therapy.    Plan:     Patient will continue speech therapy  Follow-up in a few months.  Repeat CT angio of the chest and neck to evaluate.  Prepare.  Hopefully these will have full recovery of his vocal cords and may be able to eat in the future with continued speech therapy.  Brian Mcbrien C. Randie Heinz, MD [facility] [place] Office: [phone] Pager: [phone]

## 2023-05-01 DIAGNOSIS — R1312 Dysphagia, oropharyngeal phase: Secondary | ICD-10-CM | POA: Diagnosis not present

## 2023-05-01 DIAGNOSIS — Z931 Gastrostomy status: Secondary | ICD-10-CM | POA: Diagnosis not present

## 2023-05-05 ENCOUNTER — Encounter (HOSPITAL_COMMUNITY): Payer: Self-pay | Admitting: Vascular Surgery

## 2023-05-05 DIAGNOSIS — D631 Anemia in chronic kidney disease: Secondary | ICD-10-CM | POA: Diagnosis not present

## 2023-05-05 DIAGNOSIS — M109 Gout, unspecified: Secondary | ICD-10-CM | POA: Diagnosis not present

## 2023-05-05 DIAGNOSIS — G250 Essential tremor: Secondary | ICD-10-CM | POA: Diagnosis not present

## 2023-05-05 DIAGNOSIS — Z681 Body mass index (BMI) 19 or less, adult: Secondary | ICD-10-CM | POA: Diagnosis not present

## 2023-05-05 DIAGNOSIS — R1310 Dysphagia, unspecified: Secondary | ICD-10-CM | POA: Diagnosis not present

## 2023-05-05 DIAGNOSIS — Z7982 Long term (current) use of aspirin: Secondary | ICD-10-CM | POA: Diagnosis not present

## 2023-05-05 DIAGNOSIS — Z79899 Other long term (current) drug therapy: Secondary | ICD-10-CM | POA: Diagnosis not present

## 2023-05-05 DIAGNOSIS — M8580 Other specified disorders of bone density and structure, unspecified site: Secondary | ICD-10-CM | POA: Diagnosis not present

## 2023-05-05 DIAGNOSIS — K219 Gastro-esophageal reflux disease without esophagitis: Secondary | ICD-10-CM | POA: Diagnosis not present

## 2023-05-05 DIAGNOSIS — H903 Sensorineural hearing loss, bilateral: Secondary | ICD-10-CM | POA: Diagnosis not present

## 2023-05-05 DIAGNOSIS — R911 Solitary pulmonary nodule: Secondary | ICD-10-CM | POA: Diagnosis not present

## 2023-05-05 DIAGNOSIS — Z431 Encounter for attention to gastrostomy: Secondary | ICD-10-CM | POA: Diagnosis not present

## 2023-05-05 DIAGNOSIS — Q2549 Other congenital malformations of aorta: Secondary | ICD-10-CM | POA: Diagnosis not present

## 2023-05-05 DIAGNOSIS — Z48812 Encounter for surgical aftercare following surgery on the circulatory system: Secondary | ICD-10-CM | POA: Diagnosis not present

## 2023-05-05 DIAGNOSIS — I129 Hypertensive chronic kidney disease with stage 1 through stage 4 chronic kidney disease, or unspecified chronic kidney disease: Secondary | ICD-10-CM | POA: Diagnosis not present

## 2023-05-05 DIAGNOSIS — I2510 Atherosclerotic heart disease of native coronary artery without angina pectoris: Secondary | ICD-10-CM | POA: Diagnosis not present

## 2023-05-05 DIAGNOSIS — N189 Chronic kidney disease, unspecified: Secondary | ICD-10-CM | POA: Diagnosis not present

## 2023-05-05 DIAGNOSIS — J4489 Other specified chronic obstructive pulmonary disease: Secondary | ICD-10-CM | POA: Diagnosis not present

## 2023-05-05 DIAGNOSIS — E43 Unspecified severe protein-calorie malnutrition: Secondary | ICD-10-CM | POA: Diagnosis not present

## 2023-05-05 DIAGNOSIS — Z8701 Personal history of pneumonia (recurrent): Secondary | ICD-10-CM | POA: Diagnosis not present

## 2023-05-05 DIAGNOSIS — Z7951 Long term (current) use of inhaled steroids: Secondary | ICD-10-CM | POA: Diagnosis not present

## 2023-05-05 DIAGNOSIS — Z955 Presence of coronary angioplasty implant and graft: Secondary | ICD-10-CM | POA: Diagnosis not present

## 2023-05-05 DIAGNOSIS — E785 Hyperlipidemia, unspecified: Secondary | ICD-10-CM | POA: Diagnosis not present

## 2023-05-05 DIAGNOSIS — Z79891 Long term (current) use of opiate analgesic: Secondary | ICD-10-CM | POA: Diagnosis not present

## 2023-05-06 DIAGNOSIS — Z431 Encounter for attention to gastrostomy: Secondary | ICD-10-CM | POA: Diagnosis not present

## 2023-05-06 DIAGNOSIS — Z7951 Long term (current) use of inhaled steroids: Secondary | ICD-10-CM | POA: Diagnosis not present

## 2023-05-06 DIAGNOSIS — R1310 Dysphagia, unspecified: Secondary | ICD-10-CM | POA: Diagnosis not present

## 2023-05-06 DIAGNOSIS — I2510 Atherosclerotic heart disease of native coronary artery without angina pectoris: Secondary | ICD-10-CM | POA: Diagnosis not present

## 2023-05-06 DIAGNOSIS — I129 Hypertensive chronic kidney disease with stage 1 through stage 4 chronic kidney disease, or unspecified chronic kidney disease: Secondary | ICD-10-CM | POA: Diagnosis not present

## 2023-05-06 DIAGNOSIS — Z79899 Other long term (current) drug therapy: Secondary | ICD-10-CM | POA: Diagnosis not present

## 2023-05-06 DIAGNOSIS — Z955 Presence of coronary angioplasty implant and graft: Secondary | ICD-10-CM | POA: Diagnosis not present

## 2023-05-06 DIAGNOSIS — M8580 Other specified disorders of bone density and structure, unspecified site: Secondary | ICD-10-CM | POA: Diagnosis not present

## 2023-05-06 DIAGNOSIS — E43 Unspecified severe protein-calorie malnutrition: Secondary | ICD-10-CM | POA: Diagnosis not present

## 2023-05-06 DIAGNOSIS — N189 Chronic kidney disease, unspecified: Secondary | ICD-10-CM | POA: Diagnosis not present

## 2023-05-06 DIAGNOSIS — Q2549 Other congenital malformations of aorta: Secondary | ICD-10-CM | POA: Diagnosis not present

## 2023-05-06 DIAGNOSIS — Z48812 Encounter for surgical aftercare following surgery on the circulatory system: Secondary | ICD-10-CM | POA: Diagnosis not present

## 2023-05-06 DIAGNOSIS — K219 Gastro-esophageal reflux disease without esophagitis: Secondary | ICD-10-CM | POA: Diagnosis not present

## 2023-05-06 DIAGNOSIS — Z8701 Personal history of pneumonia (recurrent): Secondary | ICD-10-CM | POA: Diagnosis not present

## 2023-05-06 DIAGNOSIS — Z681 Body mass index (BMI) 19 or less, adult: Secondary | ICD-10-CM | POA: Diagnosis not present

## 2023-05-06 DIAGNOSIS — M109 Gout, unspecified: Secondary | ICD-10-CM | POA: Diagnosis not present

## 2023-05-06 DIAGNOSIS — D631 Anemia in chronic kidney disease: Secondary | ICD-10-CM | POA: Diagnosis not present

## 2023-05-06 DIAGNOSIS — Z79891 Long term (current) use of opiate analgesic: Secondary | ICD-10-CM | POA: Diagnosis not present

## 2023-05-06 DIAGNOSIS — R911 Solitary pulmonary nodule: Secondary | ICD-10-CM | POA: Diagnosis not present

## 2023-05-06 DIAGNOSIS — G250 Essential tremor: Secondary | ICD-10-CM | POA: Diagnosis not present

## 2023-05-06 DIAGNOSIS — H903 Sensorineural hearing loss, bilateral: Secondary | ICD-10-CM | POA: Diagnosis not present

## 2023-05-06 DIAGNOSIS — Z7982 Long term (current) use of aspirin: Secondary | ICD-10-CM | POA: Diagnosis not present

## 2023-05-06 DIAGNOSIS — E785 Hyperlipidemia, unspecified: Secondary | ICD-10-CM | POA: Diagnosis not present

## 2023-05-06 DIAGNOSIS — J4489 Other specified chronic obstructive pulmonary disease: Secondary | ICD-10-CM | POA: Diagnosis not present

## 2023-05-07 DIAGNOSIS — N189 Chronic kidney disease, unspecified: Secondary | ICD-10-CM | POA: Diagnosis not present

## 2023-05-07 DIAGNOSIS — E43 Unspecified severe protein-calorie malnutrition: Secondary | ICD-10-CM | POA: Diagnosis not present

## 2023-05-07 DIAGNOSIS — Z48812 Encounter for surgical aftercare following surgery on the circulatory system: Secondary | ICD-10-CM | POA: Diagnosis not present

## 2023-05-07 DIAGNOSIS — Z8701 Personal history of pneumonia (recurrent): Secondary | ICD-10-CM | POA: Diagnosis not present

## 2023-05-07 DIAGNOSIS — J4489 Other specified chronic obstructive pulmonary disease: Secondary | ICD-10-CM | POA: Diagnosis not present

## 2023-05-07 DIAGNOSIS — D631 Anemia in chronic kidney disease: Secondary | ICD-10-CM | POA: Diagnosis not present

## 2023-05-07 DIAGNOSIS — Z7982 Long term (current) use of aspirin: Secondary | ICD-10-CM | POA: Diagnosis not present

## 2023-05-07 DIAGNOSIS — H903 Sensorineural hearing loss, bilateral: Secondary | ICD-10-CM | POA: Diagnosis not present

## 2023-05-07 DIAGNOSIS — K219 Gastro-esophageal reflux disease without esophagitis: Secondary | ICD-10-CM | POA: Diagnosis not present

## 2023-05-07 DIAGNOSIS — M109 Gout, unspecified: Secondary | ICD-10-CM | POA: Diagnosis not present

## 2023-05-07 DIAGNOSIS — R911 Solitary pulmonary nodule: Secondary | ICD-10-CM | POA: Diagnosis not present

## 2023-05-07 DIAGNOSIS — I129 Hypertensive chronic kidney disease with stage 1 through stage 4 chronic kidney disease, or unspecified chronic kidney disease: Secondary | ICD-10-CM | POA: Diagnosis not present

## 2023-05-07 DIAGNOSIS — Z7951 Long term (current) use of inhaled steroids: Secondary | ICD-10-CM | POA: Diagnosis not present

## 2023-05-07 DIAGNOSIS — Z79891 Long term (current) use of opiate analgesic: Secondary | ICD-10-CM | POA: Diagnosis not present

## 2023-05-07 DIAGNOSIS — E785 Hyperlipidemia, unspecified: Secondary | ICD-10-CM | POA: Diagnosis not present

## 2023-05-07 DIAGNOSIS — G250 Essential tremor: Secondary | ICD-10-CM | POA: Diagnosis not present

## 2023-05-07 DIAGNOSIS — M8580 Other specified disorders of bone density and structure, unspecified site: Secondary | ICD-10-CM | POA: Diagnosis not present

## 2023-05-07 DIAGNOSIS — Z955 Presence of coronary angioplasty implant and graft: Secondary | ICD-10-CM | POA: Diagnosis not present

## 2023-05-07 DIAGNOSIS — R1310 Dysphagia, unspecified: Secondary | ICD-10-CM | POA: Diagnosis not present

## 2023-05-07 DIAGNOSIS — I2510 Atherosclerotic heart disease of native coronary artery without angina pectoris: Secondary | ICD-10-CM | POA: Diagnosis not present

## 2023-05-07 DIAGNOSIS — Z79899 Other long term (current) drug therapy: Secondary | ICD-10-CM | POA: Diagnosis not present

## 2023-05-07 DIAGNOSIS — Z681 Body mass index (BMI) 19 or less, adult: Secondary | ICD-10-CM | POA: Diagnosis not present

## 2023-05-07 DIAGNOSIS — Z431 Encounter for attention to gastrostomy: Secondary | ICD-10-CM | POA: Diagnosis not present

## 2023-05-07 DIAGNOSIS — Q2549 Other congenital malformations of aorta: Secondary | ICD-10-CM | POA: Diagnosis not present

## 2023-05-08 DIAGNOSIS — R1310 Dysphagia, unspecified: Secondary | ICD-10-CM | POA: Diagnosis not present

## 2023-05-09 DIAGNOSIS — R1310 Dysphagia, unspecified: Secondary | ICD-10-CM | POA: Diagnosis not present

## 2023-05-09 DIAGNOSIS — B370 Candidal stomatitis: Secondary | ICD-10-CM | POA: Diagnosis not present

## 2023-05-09 DIAGNOSIS — K1379 Other lesions of oral mucosa: Secondary | ICD-10-CM | POA: Diagnosis not present

## 2023-05-10 DIAGNOSIS — R1310 Dysphagia, unspecified: Secondary | ICD-10-CM | POA: Diagnosis not present

## 2023-05-11 DIAGNOSIS — R1310 Dysphagia, unspecified: Secondary | ICD-10-CM | POA: Diagnosis not present

## 2023-05-12 DIAGNOSIS — R1310 Dysphagia, unspecified: Secondary | ICD-10-CM | POA: Diagnosis not present

## 2023-05-13 DIAGNOSIS — R1310 Dysphagia, unspecified: Secondary | ICD-10-CM | POA: Diagnosis not present

## 2023-05-14 DIAGNOSIS — Z681 Body mass index (BMI) 19 or less, adult: Secondary | ICD-10-CM | POA: Diagnosis not present

## 2023-05-14 DIAGNOSIS — K219 Gastro-esophageal reflux disease without esophagitis: Secondary | ICD-10-CM | POA: Diagnosis not present

## 2023-05-14 DIAGNOSIS — Z79899 Other long term (current) drug therapy: Secondary | ICD-10-CM | POA: Diagnosis not present

## 2023-05-14 DIAGNOSIS — R911 Solitary pulmonary nodule: Secondary | ICD-10-CM | POA: Diagnosis not present

## 2023-05-14 DIAGNOSIS — Q2549 Other congenital malformations of aorta: Secondary | ICD-10-CM | POA: Diagnosis not present

## 2023-05-14 DIAGNOSIS — Z7951 Long term (current) use of inhaled steroids: Secondary | ICD-10-CM | POA: Diagnosis not present

## 2023-05-14 DIAGNOSIS — E43 Unspecified severe protein-calorie malnutrition: Secondary | ICD-10-CM | POA: Diagnosis not present

## 2023-05-14 DIAGNOSIS — I129 Hypertensive chronic kidney disease with stage 1 through stage 4 chronic kidney disease, or unspecified chronic kidney disease: Secondary | ICD-10-CM | POA: Diagnosis not present

## 2023-05-14 DIAGNOSIS — R1310 Dysphagia, unspecified: Secondary | ICD-10-CM | POA: Diagnosis not present

## 2023-05-14 DIAGNOSIS — Z7982 Long term (current) use of aspirin: Secondary | ICD-10-CM | POA: Diagnosis not present

## 2023-05-14 DIAGNOSIS — E785 Hyperlipidemia, unspecified: Secondary | ICD-10-CM | POA: Diagnosis not present

## 2023-05-14 DIAGNOSIS — I2510 Atherosclerotic heart disease of native coronary artery without angina pectoris: Secondary | ICD-10-CM | POA: Diagnosis not present

## 2023-05-14 DIAGNOSIS — H903 Sensorineural hearing loss, bilateral: Secondary | ICD-10-CM | POA: Diagnosis not present

## 2023-05-14 DIAGNOSIS — G250 Essential tremor: Secondary | ICD-10-CM | POA: Diagnosis not present

## 2023-05-14 DIAGNOSIS — M8580 Other specified disorders of bone density and structure, unspecified site: Secondary | ICD-10-CM | POA: Diagnosis not present

## 2023-05-14 DIAGNOSIS — Z8701 Personal history of pneumonia (recurrent): Secondary | ICD-10-CM | POA: Diagnosis not present

## 2023-05-14 DIAGNOSIS — J4489 Other specified chronic obstructive pulmonary disease: Secondary | ICD-10-CM | POA: Diagnosis not present

## 2023-05-14 DIAGNOSIS — Z48812 Encounter for surgical aftercare following surgery on the circulatory system: Secondary | ICD-10-CM | POA: Diagnosis not present

## 2023-05-14 DIAGNOSIS — Z955 Presence of coronary angioplasty implant and graft: Secondary | ICD-10-CM | POA: Diagnosis not present

## 2023-05-14 DIAGNOSIS — M109 Gout, unspecified: Secondary | ICD-10-CM | POA: Diagnosis not present

## 2023-05-14 DIAGNOSIS — Z79891 Long term (current) use of opiate analgesic: Secondary | ICD-10-CM | POA: Diagnosis not present

## 2023-05-14 DIAGNOSIS — Z431 Encounter for attention to gastrostomy: Secondary | ICD-10-CM | POA: Diagnosis not present

## 2023-05-14 DIAGNOSIS — D631 Anemia in chronic kidney disease: Secondary | ICD-10-CM | POA: Diagnosis not present

## 2023-05-14 DIAGNOSIS — N189 Chronic kidney disease, unspecified: Secondary | ICD-10-CM | POA: Diagnosis not present

## 2023-05-15 DIAGNOSIS — R1310 Dysphagia, unspecified: Secondary | ICD-10-CM | POA: Diagnosis not present

## 2023-05-16 DIAGNOSIS — R1310 Dysphagia, unspecified: Secondary | ICD-10-CM | POA: Diagnosis not present

## 2023-05-17 DIAGNOSIS — R1310 Dysphagia, unspecified: Secondary | ICD-10-CM | POA: Diagnosis not present

## 2023-05-18 DIAGNOSIS — R1310 Dysphagia, unspecified: Secondary | ICD-10-CM | POA: Diagnosis not present

## 2023-05-19 DIAGNOSIS — Z431 Encounter for attention to gastrostomy: Secondary | ICD-10-CM | POA: Diagnosis not present

## 2023-05-19 DIAGNOSIS — M8580 Other specified disorders of bone density and structure, unspecified site: Secondary | ICD-10-CM | POA: Diagnosis not present

## 2023-05-19 DIAGNOSIS — R911 Solitary pulmonary nodule: Secondary | ICD-10-CM | POA: Diagnosis not present

## 2023-05-19 DIAGNOSIS — I129 Hypertensive chronic kidney disease with stage 1 through stage 4 chronic kidney disease, or unspecified chronic kidney disease: Secondary | ICD-10-CM | POA: Diagnosis not present

## 2023-05-19 DIAGNOSIS — M109 Gout, unspecified: Secondary | ICD-10-CM | POA: Diagnosis not present

## 2023-05-19 DIAGNOSIS — J4489 Other specified chronic obstructive pulmonary disease: Secondary | ICD-10-CM | POA: Diagnosis not present

## 2023-05-19 DIAGNOSIS — H903 Sensorineural hearing loss, bilateral: Secondary | ICD-10-CM | POA: Diagnosis not present

## 2023-05-19 DIAGNOSIS — Q2549 Other congenital malformations of aorta: Secondary | ICD-10-CM | POA: Diagnosis not present

## 2023-05-19 DIAGNOSIS — D631 Anemia in chronic kidney disease: Secondary | ICD-10-CM | POA: Diagnosis not present

## 2023-05-19 DIAGNOSIS — G250 Essential tremor: Secondary | ICD-10-CM | POA: Diagnosis not present

## 2023-05-19 DIAGNOSIS — Z8701 Personal history of pneumonia (recurrent): Secondary | ICD-10-CM | POA: Diagnosis not present

## 2023-05-19 DIAGNOSIS — Z79891 Long term (current) use of opiate analgesic: Secondary | ICD-10-CM | POA: Diagnosis not present

## 2023-05-19 DIAGNOSIS — Z7951 Long term (current) use of inhaled steroids: Secondary | ICD-10-CM | POA: Diagnosis not present

## 2023-05-19 DIAGNOSIS — Z79899 Other long term (current) drug therapy: Secondary | ICD-10-CM | POA: Diagnosis not present

## 2023-05-19 DIAGNOSIS — Z955 Presence of coronary angioplasty implant and graft: Secondary | ICD-10-CM | POA: Diagnosis not present

## 2023-05-19 DIAGNOSIS — Z681 Body mass index (BMI) 19 or less, adult: Secondary | ICD-10-CM | POA: Diagnosis not present

## 2023-05-19 DIAGNOSIS — R1310 Dysphagia, unspecified: Secondary | ICD-10-CM | POA: Diagnosis not present

## 2023-05-19 DIAGNOSIS — Z48812 Encounter for surgical aftercare following surgery on the circulatory system: Secondary | ICD-10-CM | POA: Diagnosis not present

## 2023-05-19 DIAGNOSIS — N189 Chronic kidney disease, unspecified: Secondary | ICD-10-CM | POA: Diagnosis not present

## 2023-05-19 DIAGNOSIS — E43 Unspecified severe protein-calorie malnutrition: Secondary | ICD-10-CM | POA: Diagnosis not present

## 2023-05-19 DIAGNOSIS — K219 Gastro-esophageal reflux disease without esophagitis: Secondary | ICD-10-CM | POA: Diagnosis not present

## 2023-05-19 DIAGNOSIS — E785 Hyperlipidemia, unspecified: Secondary | ICD-10-CM | POA: Diagnosis not present

## 2023-05-19 DIAGNOSIS — Z7982 Long term (current) use of aspirin: Secondary | ICD-10-CM | POA: Diagnosis not present

## 2023-05-19 DIAGNOSIS — I2510 Atherosclerotic heart disease of native coronary artery without angina pectoris: Secondary | ICD-10-CM | POA: Diagnosis not present

## 2023-05-20 DIAGNOSIS — Q2549 Other congenital malformations of aorta: Secondary | ICD-10-CM | POA: Diagnosis not present

## 2023-05-20 DIAGNOSIS — Z431 Encounter for attention to gastrostomy: Secondary | ICD-10-CM | POA: Diagnosis not present

## 2023-05-20 DIAGNOSIS — Z7982 Long term (current) use of aspirin: Secondary | ICD-10-CM | POA: Diagnosis not present

## 2023-05-20 DIAGNOSIS — E785 Hyperlipidemia, unspecified: Secondary | ICD-10-CM | POA: Diagnosis not present

## 2023-05-20 DIAGNOSIS — I2510 Atherosclerotic heart disease of native coronary artery without angina pectoris: Secondary | ICD-10-CM | POA: Diagnosis not present

## 2023-05-20 DIAGNOSIS — D631 Anemia in chronic kidney disease: Secondary | ICD-10-CM | POA: Diagnosis not present

## 2023-05-20 DIAGNOSIS — N189 Chronic kidney disease, unspecified: Secondary | ICD-10-CM | POA: Diagnosis not present

## 2023-05-20 DIAGNOSIS — M109 Gout, unspecified: Secondary | ICD-10-CM | POA: Diagnosis not present

## 2023-05-20 DIAGNOSIS — G250 Essential tremor: Secondary | ICD-10-CM | POA: Diagnosis not present

## 2023-05-20 DIAGNOSIS — K219 Gastro-esophageal reflux disease without esophagitis: Secondary | ICD-10-CM | POA: Diagnosis not present

## 2023-05-20 DIAGNOSIS — R911 Solitary pulmonary nodule: Secondary | ICD-10-CM | POA: Diagnosis not present

## 2023-05-20 DIAGNOSIS — Z955 Presence of coronary angioplasty implant and graft: Secondary | ICD-10-CM | POA: Diagnosis not present

## 2023-05-20 DIAGNOSIS — Z79891 Long term (current) use of opiate analgesic: Secondary | ICD-10-CM | POA: Diagnosis not present

## 2023-05-20 DIAGNOSIS — Z8701 Personal history of pneumonia (recurrent): Secondary | ICD-10-CM | POA: Diagnosis not present

## 2023-05-20 DIAGNOSIS — H903 Sensorineural hearing loss, bilateral: Secondary | ICD-10-CM | POA: Diagnosis not present

## 2023-05-20 DIAGNOSIS — Z48812 Encounter for surgical aftercare following surgery on the circulatory system: Secondary | ICD-10-CM | POA: Diagnosis not present

## 2023-05-20 DIAGNOSIS — Z7951 Long term (current) use of inhaled steroids: Secondary | ICD-10-CM | POA: Diagnosis not present

## 2023-05-20 DIAGNOSIS — Z79899 Other long term (current) drug therapy: Secondary | ICD-10-CM | POA: Diagnosis not present

## 2023-05-20 DIAGNOSIS — R1310 Dysphagia, unspecified: Secondary | ICD-10-CM | POA: Diagnosis not present

## 2023-05-20 DIAGNOSIS — J4489 Other specified chronic obstructive pulmonary disease: Secondary | ICD-10-CM | POA: Diagnosis not present

## 2023-05-20 DIAGNOSIS — M8580 Other specified disorders of bone density and structure, unspecified site: Secondary | ICD-10-CM | POA: Diagnosis not present

## 2023-05-20 DIAGNOSIS — I129 Hypertensive chronic kidney disease with stage 1 through stage 4 chronic kidney disease, or unspecified chronic kidney disease: Secondary | ICD-10-CM | POA: Diagnosis not present

## 2023-05-20 DIAGNOSIS — E43 Unspecified severe protein-calorie malnutrition: Secondary | ICD-10-CM | POA: Diagnosis not present

## 2023-05-20 DIAGNOSIS — Z681 Body mass index (BMI) 19 or less, adult: Secondary | ICD-10-CM | POA: Diagnosis not present

## 2023-05-21 DIAGNOSIS — D631 Anemia in chronic kidney disease: Secondary | ICD-10-CM | POA: Diagnosis not present

## 2023-05-21 DIAGNOSIS — Z955 Presence of coronary angioplasty implant and graft: Secondary | ICD-10-CM | POA: Diagnosis not present

## 2023-05-21 DIAGNOSIS — Z7951 Long term (current) use of inhaled steroids: Secondary | ICD-10-CM | POA: Diagnosis not present

## 2023-05-21 DIAGNOSIS — I129 Hypertensive chronic kidney disease with stage 1 through stage 4 chronic kidney disease, or unspecified chronic kidney disease: Secondary | ICD-10-CM | POA: Diagnosis not present

## 2023-05-21 DIAGNOSIS — H903 Sensorineural hearing loss, bilateral: Secondary | ICD-10-CM | POA: Diagnosis not present

## 2023-05-21 DIAGNOSIS — Z79891 Long term (current) use of opiate analgesic: Secondary | ICD-10-CM | POA: Diagnosis not present

## 2023-05-21 DIAGNOSIS — J4489 Other specified chronic obstructive pulmonary disease: Secondary | ICD-10-CM | POA: Diagnosis not present

## 2023-05-21 DIAGNOSIS — Z681 Body mass index (BMI) 19 or less, adult: Secondary | ICD-10-CM | POA: Diagnosis not present

## 2023-05-21 DIAGNOSIS — E785 Hyperlipidemia, unspecified: Secondary | ICD-10-CM | POA: Diagnosis not present

## 2023-05-21 DIAGNOSIS — R1310 Dysphagia, unspecified: Secondary | ICD-10-CM | POA: Diagnosis not present

## 2023-05-21 DIAGNOSIS — N189 Chronic kidney disease, unspecified: Secondary | ICD-10-CM | POA: Diagnosis not present

## 2023-05-21 DIAGNOSIS — K219 Gastro-esophageal reflux disease without esophagitis: Secondary | ICD-10-CM | POA: Diagnosis not present

## 2023-05-21 DIAGNOSIS — Z79899 Other long term (current) drug therapy: Secondary | ICD-10-CM | POA: Diagnosis not present

## 2023-05-21 DIAGNOSIS — E43 Unspecified severe protein-calorie malnutrition: Secondary | ICD-10-CM | POA: Diagnosis not present

## 2023-05-21 DIAGNOSIS — M109 Gout, unspecified: Secondary | ICD-10-CM | POA: Diagnosis not present

## 2023-05-21 DIAGNOSIS — I2510 Atherosclerotic heart disease of native coronary artery without angina pectoris: Secondary | ICD-10-CM | POA: Diagnosis not present

## 2023-05-21 DIAGNOSIS — Z48812 Encounter for surgical aftercare following surgery on the circulatory system: Secondary | ICD-10-CM | POA: Diagnosis not present

## 2023-05-21 DIAGNOSIS — Z431 Encounter for attention to gastrostomy: Secondary | ICD-10-CM | POA: Diagnosis not present

## 2023-05-21 DIAGNOSIS — Q2549 Other congenital malformations of aorta: Secondary | ICD-10-CM | POA: Diagnosis not present

## 2023-05-21 DIAGNOSIS — M8580 Other specified disorders of bone density and structure, unspecified site: Secondary | ICD-10-CM | POA: Diagnosis not present

## 2023-05-21 DIAGNOSIS — G250 Essential tremor: Secondary | ICD-10-CM | POA: Diagnosis not present

## 2023-05-21 DIAGNOSIS — Z8701 Personal history of pneumonia (recurrent): Secondary | ICD-10-CM | POA: Diagnosis not present

## 2023-05-21 DIAGNOSIS — R911 Solitary pulmonary nodule: Secondary | ICD-10-CM | POA: Diagnosis not present

## 2023-05-21 DIAGNOSIS — Z7982 Long term (current) use of aspirin: Secondary | ICD-10-CM | POA: Diagnosis not present

## 2023-05-22 DIAGNOSIS — R1310 Dysphagia, unspecified: Secondary | ICD-10-CM | POA: Diagnosis not present

## 2023-05-23 DIAGNOSIS — R1310 Dysphagia, unspecified: Secondary | ICD-10-CM | POA: Diagnosis not present

## 2023-05-24 DIAGNOSIS — R1310 Dysphagia, unspecified: Secondary | ICD-10-CM | POA: Diagnosis not present

## 2023-05-25 DIAGNOSIS — K120 Recurrent oral aphthae: Secondary | ICD-10-CM | POA: Diagnosis not present

## 2023-05-25 DIAGNOSIS — R1310 Dysphagia, unspecified: Secondary | ICD-10-CM | POA: Diagnosis not present

## 2023-05-26 DIAGNOSIS — J4489 Other specified chronic obstructive pulmonary disease: Secondary | ICD-10-CM | POA: Diagnosis not present

## 2023-05-26 DIAGNOSIS — M8580 Other specified disorders of bone density and structure, unspecified site: Secondary | ICD-10-CM | POA: Diagnosis not present

## 2023-05-26 DIAGNOSIS — Z7982 Long term (current) use of aspirin: Secondary | ICD-10-CM | POA: Diagnosis not present

## 2023-05-26 DIAGNOSIS — Z955 Presence of coronary angioplasty implant and graft: Secondary | ICD-10-CM | POA: Diagnosis not present

## 2023-05-26 DIAGNOSIS — Z431 Encounter for attention to gastrostomy: Secondary | ICD-10-CM | POA: Diagnosis not present

## 2023-05-26 DIAGNOSIS — R911 Solitary pulmonary nodule: Secondary | ICD-10-CM | POA: Diagnosis not present

## 2023-05-26 DIAGNOSIS — I2510 Atherosclerotic heart disease of native coronary artery without angina pectoris: Secondary | ICD-10-CM | POA: Diagnosis not present

## 2023-05-26 DIAGNOSIS — M109 Gout, unspecified: Secondary | ICD-10-CM | POA: Diagnosis not present

## 2023-05-26 DIAGNOSIS — K219 Gastro-esophageal reflux disease without esophagitis: Secondary | ICD-10-CM | POA: Diagnosis not present

## 2023-05-26 DIAGNOSIS — Z7951 Long term (current) use of inhaled steroids: Secondary | ICD-10-CM | POA: Diagnosis not present

## 2023-05-26 DIAGNOSIS — R1312 Dysphagia, oropharyngeal phase: Secondary | ICD-10-CM | POA: Diagnosis not present

## 2023-05-26 DIAGNOSIS — H903 Sensorineural hearing loss, bilateral: Secondary | ICD-10-CM | POA: Diagnosis not present

## 2023-05-26 DIAGNOSIS — Q2549 Other congenital malformations of aorta: Secondary | ICD-10-CM | POA: Diagnosis not present

## 2023-05-26 DIAGNOSIS — Z8701 Personal history of pneumonia (recurrent): Secondary | ICD-10-CM | POA: Diagnosis not present

## 2023-05-26 DIAGNOSIS — E785 Hyperlipidemia, unspecified: Secondary | ICD-10-CM | POA: Diagnosis not present

## 2023-05-26 DIAGNOSIS — N189 Chronic kidney disease, unspecified: Secondary | ICD-10-CM | POA: Diagnosis not present

## 2023-05-26 DIAGNOSIS — E43 Unspecified severe protein-calorie malnutrition: Secondary | ICD-10-CM | POA: Diagnosis not present

## 2023-05-26 DIAGNOSIS — I129 Hypertensive chronic kidney disease with stage 1 through stage 4 chronic kidney disease, or unspecified chronic kidney disease: Secondary | ICD-10-CM | POA: Diagnosis not present

## 2023-05-26 DIAGNOSIS — R1310 Dysphagia, unspecified: Secondary | ICD-10-CM | POA: Diagnosis not present

## 2023-05-26 DIAGNOSIS — G250 Essential tremor: Secondary | ICD-10-CM | POA: Diagnosis not present

## 2023-05-26 DIAGNOSIS — Z79899 Other long term (current) drug therapy: Secondary | ICD-10-CM | POA: Diagnosis not present

## 2023-05-26 DIAGNOSIS — D631 Anemia in chronic kidney disease: Secondary | ICD-10-CM | POA: Diagnosis not present

## 2023-05-26 DIAGNOSIS — K5900 Constipation, unspecified: Secondary | ICD-10-CM | POA: Diagnosis not present

## 2023-05-27 ENCOUNTER — Other Ambulatory Visit: Payer: Self-pay

## 2023-05-27 DIAGNOSIS — E785 Hyperlipidemia, unspecified: Secondary | ICD-10-CM | POA: Diagnosis not present

## 2023-05-27 DIAGNOSIS — M109 Gout, unspecified: Secondary | ICD-10-CM | POA: Diagnosis not present

## 2023-05-27 DIAGNOSIS — G250 Essential tremor: Secondary | ICD-10-CM | POA: Diagnosis not present

## 2023-05-27 DIAGNOSIS — Z7951 Long term (current) use of inhaled steroids: Secondary | ICD-10-CM | POA: Diagnosis not present

## 2023-05-27 DIAGNOSIS — K5900 Constipation, unspecified: Secondary | ICD-10-CM | POA: Diagnosis not present

## 2023-05-27 DIAGNOSIS — Q2549 Other congenital malformations of aorta: Secondary | ICD-10-CM

## 2023-05-27 DIAGNOSIS — D631 Anemia in chronic kidney disease: Secondary | ICD-10-CM | POA: Diagnosis not present

## 2023-05-27 DIAGNOSIS — H903 Sensorineural hearing loss, bilateral: Secondary | ICD-10-CM | POA: Diagnosis not present

## 2023-05-27 DIAGNOSIS — I2510 Atherosclerotic heart disease of native coronary artery without angina pectoris: Secondary | ICD-10-CM | POA: Diagnosis not present

## 2023-05-27 DIAGNOSIS — I129 Hypertensive chronic kidney disease with stage 1 through stage 4 chronic kidney disease, or unspecified chronic kidney disease: Secondary | ICD-10-CM | POA: Diagnosis not present

## 2023-05-27 DIAGNOSIS — Z955 Presence of coronary angioplasty implant and graft: Secondary | ICD-10-CM | POA: Diagnosis not present

## 2023-05-27 DIAGNOSIS — Z7982 Long term (current) use of aspirin: Secondary | ICD-10-CM | POA: Diagnosis not present

## 2023-05-27 DIAGNOSIS — R911 Solitary pulmonary nodule: Secondary | ICD-10-CM | POA: Diagnosis not present

## 2023-05-27 DIAGNOSIS — R1310 Dysphagia, unspecified: Secondary | ICD-10-CM | POA: Diagnosis not present

## 2023-05-27 DIAGNOSIS — M8580 Other specified disorders of bone density and structure, unspecified site: Secondary | ICD-10-CM | POA: Diagnosis not present

## 2023-05-27 DIAGNOSIS — K219 Gastro-esophageal reflux disease without esophagitis: Secondary | ICD-10-CM | POA: Diagnosis not present

## 2023-05-27 DIAGNOSIS — R1312 Dysphagia, oropharyngeal phase: Secondary | ICD-10-CM | POA: Diagnosis not present

## 2023-05-27 DIAGNOSIS — Z431 Encounter for attention to gastrostomy: Secondary | ICD-10-CM | POA: Diagnosis not present

## 2023-05-27 DIAGNOSIS — Z79899 Other long term (current) drug therapy: Secondary | ICD-10-CM | POA: Diagnosis not present

## 2023-05-27 DIAGNOSIS — Z8701 Personal history of pneumonia (recurrent): Secondary | ICD-10-CM | POA: Diagnosis not present

## 2023-05-27 DIAGNOSIS — J4489 Other specified chronic obstructive pulmonary disease: Secondary | ICD-10-CM | POA: Diagnosis not present

## 2023-05-27 DIAGNOSIS — N189 Chronic kidney disease, unspecified: Secondary | ICD-10-CM | POA: Diagnosis not present

## 2023-05-27 DIAGNOSIS — E43 Unspecified severe protein-calorie malnutrition: Secondary | ICD-10-CM | POA: Diagnosis not present

## 2023-05-28 DIAGNOSIS — R1310 Dysphagia, unspecified: Secondary | ICD-10-CM | POA: Diagnosis not present

## 2023-06-02 DIAGNOSIS — J4489 Other specified chronic obstructive pulmonary disease: Secondary | ICD-10-CM | POA: Diagnosis not present

## 2023-06-02 DIAGNOSIS — Z8701 Personal history of pneumonia (recurrent): Secondary | ICD-10-CM | POA: Diagnosis not present

## 2023-06-02 DIAGNOSIS — Q2549 Other congenital malformations of aorta: Secondary | ICD-10-CM | POA: Diagnosis not present

## 2023-06-02 DIAGNOSIS — G250 Essential tremor: Secondary | ICD-10-CM | POA: Diagnosis not present

## 2023-06-02 DIAGNOSIS — Z431 Encounter for attention to gastrostomy: Secondary | ICD-10-CM | POA: Diagnosis not present

## 2023-06-02 DIAGNOSIS — M109 Gout, unspecified: Secondary | ICD-10-CM | POA: Diagnosis not present

## 2023-06-02 DIAGNOSIS — Z7982 Long term (current) use of aspirin: Secondary | ICD-10-CM | POA: Diagnosis not present

## 2023-06-02 DIAGNOSIS — Z955 Presence of coronary angioplasty implant and graft: Secondary | ICD-10-CM | POA: Diagnosis not present

## 2023-06-02 DIAGNOSIS — Z7951 Long term (current) use of inhaled steroids: Secondary | ICD-10-CM | POA: Diagnosis not present

## 2023-06-02 DIAGNOSIS — R911 Solitary pulmonary nodule: Secondary | ICD-10-CM | POA: Diagnosis not present

## 2023-06-02 DIAGNOSIS — K219 Gastro-esophageal reflux disease without esophagitis: Secondary | ICD-10-CM | POA: Diagnosis not present

## 2023-06-02 DIAGNOSIS — E43 Unspecified severe protein-calorie malnutrition: Secondary | ICD-10-CM | POA: Diagnosis not present

## 2023-06-02 DIAGNOSIS — K5900 Constipation, unspecified: Secondary | ICD-10-CM | POA: Diagnosis not present

## 2023-06-02 DIAGNOSIS — D631 Anemia in chronic kidney disease: Secondary | ICD-10-CM | POA: Diagnosis not present

## 2023-06-02 DIAGNOSIS — M8580 Other specified disorders of bone density and structure, unspecified site: Secondary | ICD-10-CM | POA: Diagnosis not present

## 2023-06-02 DIAGNOSIS — R1312 Dysphagia, oropharyngeal phase: Secondary | ICD-10-CM | POA: Diagnosis not present

## 2023-06-02 DIAGNOSIS — I129 Hypertensive chronic kidney disease with stage 1 through stage 4 chronic kidney disease, or unspecified chronic kidney disease: Secondary | ICD-10-CM | POA: Diagnosis not present

## 2023-06-02 DIAGNOSIS — I2510 Atherosclerotic heart disease of native coronary artery without angina pectoris: Secondary | ICD-10-CM | POA: Diagnosis not present

## 2023-06-02 DIAGNOSIS — N189 Chronic kidney disease, unspecified: Secondary | ICD-10-CM | POA: Diagnosis not present

## 2023-06-02 DIAGNOSIS — H903 Sensorineural hearing loss, bilateral: Secondary | ICD-10-CM | POA: Diagnosis not present

## 2023-06-02 DIAGNOSIS — E785 Hyperlipidemia, unspecified: Secondary | ICD-10-CM | POA: Diagnosis not present

## 2023-06-02 DIAGNOSIS — Z79899 Other long term (current) drug therapy: Secondary | ICD-10-CM | POA: Diagnosis not present

## 2023-06-04 DIAGNOSIS — R1310 Dysphagia, unspecified: Secondary | ICD-10-CM | POA: Diagnosis not present

## 2023-06-05 DIAGNOSIS — N189 Chronic kidney disease, unspecified: Secondary | ICD-10-CM | POA: Diagnosis not present

## 2023-06-05 DIAGNOSIS — I129 Hypertensive chronic kidney disease with stage 1 through stage 4 chronic kidney disease, or unspecified chronic kidney disease: Secondary | ICD-10-CM | POA: Diagnosis not present

## 2023-06-05 DIAGNOSIS — Z8701 Personal history of pneumonia (recurrent): Secondary | ICD-10-CM | POA: Diagnosis not present

## 2023-06-05 DIAGNOSIS — Z7951 Long term (current) use of inhaled steroids: Secondary | ICD-10-CM | POA: Diagnosis not present

## 2023-06-05 DIAGNOSIS — J4489 Other specified chronic obstructive pulmonary disease: Secondary | ICD-10-CM | POA: Diagnosis not present

## 2023-06-05 DIAGNOSIS — E43 Unspecified severe protein-calorie malnutrition: Secondary | ICD-10-CM | POA: Diagnosis not present

## 2023-06-05 DIAGNOSIS — K5900 Constipation, unspecified: Secondary | ICD-10-CM | POA: Diagnosis not present

## 2023-06-05 DIAGNOSIS — D631 Anemia in chronic kidney disease: Secondary | ICD-10-CM | POA: Diagnosis not present

## 2023-06-05 DIAGNOSIS — Z955 Presence of coronary angioplasty implant and graft: Secondary | ICD-10-CM | POA: Diagnosis not present

## 2023-06-05 DIAGNOSIS — E785 Hyperlipidemia, unspecified: Secondary | ICD-10-CM | POA: Diagnosis not present

## 2023-06-05 DIAGNOSIS — R911 Solitary pulmonary nodule: Secondary | ICD-10-CM | POA: Diagnosis not present

## 2023-06-05 DIAGNOSIS — Z431 Encounter for attention to gastrostomy: Secondary | ICD-10-CM | POA: Diagnosis not present

## 2023-06-05 DIAGNOSIS — R1312 Dysphagia, oropharyngeal phase: Secondary | ICD-10-CM | POA: Diagnosis not present

## 2023-06-05 DIAGNOSIS — K219 Gastro-esophageal reflux disease without esophagitis: Secondary | ICD-10-CM | POA: Diagnosis not present

## 2023-06-05 DIAGNOSIS — H903 Sensorineural hearing loss, bilateral: Secondary | ICD-10-CM | POA: Diagnosis not present

## 2023-06-05 DIAGNOSIS — Z79899 Other long term (current) drug therapy: Secondary | ICD-10-CM | POA: Diagnosis not present

## 2023-06-05 DIAGNOSIS — M109 Gout, unspecified: Secondary | ICD-10-CM | POA: Diagnosis not present

## 2023-06-05 DIAGNOSIS — Z7982 Long term (current) use of aspirin: Secondary | ICD-10-CM | POA: Diagnosis not present

## 2023-06-05 DIAGNOSIS — Q2549 Other congenital malformations of aorta: Secondary | ICD-10-CM | POA: Diagnosis not present

## 2023-06-05 DIAGNOSIS — G250 Essential tremor: Secondary | ICD-10-CM | POA: Diagnosis not present

## 2023-06-05 DIAGNOSIS — I2510 Atherosclerotic heart disease of native coronary artery without angina pectoris: Secondary | ICD-10-CM | POA: Diagnosis not present

## 2023-06-05 DIAGNOSIS — M8580 Other specified disorders of bone density and structure, unspecified site: Secondary | ICD-10-CM | POA: Diagnosis not present

## 2023-06-05 DIAGNOSIS — R1310 Dysphagia, unspecified: Secondary | ICD-10-CM | POA: Diagnosis not present

## 2023-06-06 DIAGNOSIS — R1310 Dysphagia, unspecified: Secondary | ICD-10-CM | POA: Diagnosis not present

## 2023-06-07 DIAGNOSIS — R1310 Dysphagia, unspecified: Secondary | ICD-10-CM | POA: Diagnosis not present

## 2023-06-08 DIAGNOSIS — R1310 Dysphagia, unspecified: Secondary | ICD-10-CM | POA: Diagnosis not present

## 2023-06-09 DIAGNOSIS — R1310 Dysphagia, unspecified: Secondary | ICD-10-CM | POA: Diagnosis not present

## 2023-06-09 DIAGNOSIS — R1314 Dysphagia, pharyngoesophageal phase: Secondary | ICD-10-CM | POA: Diagnosis not present

## 2023-06-09 DIAGNOSIS — J3801 Paralysis of vocal cords and larynx, unilateral: Secondary | ICD-10-CM | POA: Diagnosis not present

## 2023-06-10 ENCOUNTER — Encounter: Payer: Self-pay | Admitting: Vascular Surgery

## 2023-06-10 DIAGNOSIS — Z955 Presence of coronary angioplasty implant and graft: Secondary | ICD-10-CM | POA: Diagnosis not present

## 2023-06-10 DIAGNOSIS — G250 Essential tremor: Secondary | ICD-10-CM | POA: Diagnosis not present

## 2023-06-10 DIAGNOSIS — J4489 Other specified chronic obstructive pulmonary disease: Secondary | ICD-10-CM | POA: Diagnosis not present

## 2023-06-10 DIAGNOSIS — Z8701 Personal history of pneumonia (recurrent): Secondary | ICD-10-CM | POA: Diagnosis not present

## 2023-06-10 DIAGNOSIS — M8580 Other specified disorders of bone density and structure, unspecified site: Secondary | ICD-10-CM | POA: Diagnosis not present

## 2023-06-10 DIAGNOSIS — D631 Anemia in chronic kidney disease: Secondary | ICD-10-CM | POA: Diagnosis not present

## 2023-06-10 DIAGNOSIS — E785 Hyperlipidemia, unspecified: Secondary | ICD-10-CM | POA: Diagnosis not present

## 2023-06-10 DIAGNOSIS — R911 Solitary pulmonary nodule: Secondary | ICD-10-CM | POA: Diagnosis not present

## 2023-06-10 DIAGNOSIS — Z7951 Long term (current) use of inhaled steroids: Secondary | ICD-10-CM | POA: Diagnosis not present

## 2023-06-10 DIAGNOSIS — Q2549 Other congenital malformations of aorta: Secondary | ICD-10-CM | POA: Diagnosis not present

## 2023-06-10 DIAGNOSIS — M109 Gout, unspecified: Secondary | ICD-10-CM | POA: Diagnosis not present

## 2023-06-10 DIAGNOSIS — R1310 Dysphagia, unspecified: Secondary | ICD-10-CM | POA: Diagnosis not present

## 2023-06-10 DIAGNOSIS — Z431 Encounter for attention to gastrostomy: Secondary | ICD-10-CM | POA: Diagnosis not present

## 2023-06-10 DIAGNOSIS — K5900 Constipation, unspecified: Secondary | ICD-10-CM | POA: Diagnosis not present

## 2023-06-10 DIAGNOSIS — J3801 Paralysis of vocal cords and larynx, unilateral: Secondary | ICD-10-CM | POA: Diagnosis not present

## 2023-06-10 DIAGNOSIS — E43 Unspecified severe protein-calorie malnutrition: Secondary | ICD-10-CM | POA: Diagnosis not present

## 2023-06-10 DIAGNOSIS — R1314 Dysphagia, pharyngoesophageal phase: Secondary | ICD-10-CM | POA: Diagnosis not present

## 2023-06-10 DIAGNOSIS — H903 Sensorineural hearing loss, bilateral: Secondary | ICD-10-CM | POA: Diagnosis not present

## 2023-06-10 DIAGNOSIS — R1312 Dysphagia, oropharyngeal phase: Secondary | ICD-10-CM | POA: Diagnosis not present

## 2023-06-10 DIAGNOSIS — Z79899 Other long term (current) drug therapy: Secondary | ICD-10-CM | POA: Diagnosis not present

## 2023-06-10 DIAGNOSIS — K219 Gastro-esophageal reflux disease without esophagitis: Secondary | ICD-10-CM | POA: Diagnosis not present

## 2023-06-10 DIAGNOSIS — Z7982 Long term (current) use of aspirin: Secondary | ICD-10-CM | POA: Diagnosis not present

## 2023-06-10 DIAGNOSIS — I2510 Atherosclerotic heart disease of native coronary artery without angina pectoris: Secondary | ICD-10-CM | POA: Diagnosis not present

## 2023-06-10 DIAGNOSIS — I129 Hypertensive chronic kidney disease with stage 1 through stage 4 chronic kidney disease, or unspecified chronic kidney disease: Secondary | ICD-10-CM | POA: Diagnosis not present

## 2023-06-10 DIAGNOSIS — N189 Chronic kidney disease, unspecified: Secondary | ICD-10-CM | POA: Diagnosis not present

## 2023-06-11 DIAGNOSIS — R1310 Dysphagia, unspecified: Secondary | ICD-10-CM | POA: Diagnosis not present

## 2023-06-12 DIAGNOSIS — R1310 Dysphagia, unspecified: Secondary | ICD-10-CM | POA: Diagnosis not present

## 2023-06-13 DIAGNOSIS — Z79899 Other long term (current) drug therapy: Secondary | ICD-10-CM | POA: Diagnosis not present

## 2023-06-13 DIAGNOSIS — Z7982 Long term (current) use of aspirin: Secondary | ICD-10-CM | POA: Diagnosis not present

## 2023-06-13 DIAGNOSIS — R1312 Dysphagia, oropharyngeal phase: Secondary | ICD-10-CM | POA: Diagnosis not present

## 2023-06-13 DIAGNOSIS — Z8701 Personal history of pneumonia (recurrent): Secondary | ICD-10-CM | POA: Diagnosis not present

## 2023-06-13 DIAGNOSIS — K5900 Constipation, unspecified: Secondary | ICD-10-CM | POA: Diagnosis not present

## 2023-06-13 DIAGNOSIS — R1310 Dysphagia, unspecified: Secondary | ICD-10-CM | POA: Diagnosis not present

## 2023-06-13 DIAGNOSIS — R5383 Other fatigue: Secondary | ICD-10-CM | POA: Diagnosis not present

## 2023-06-13 DIAGNOSIS — Z7951 Long term (current) use of inhaled steroids: Secondary | ICD-10-CM | POA: Diagnosis not present

## 2023-06-13 DIAGNOSIS — G250 Essential tremor: Secondary | ICD-10-CM | POA: Diagnosis not present

## 2023-06-13 DIAGNOSIS — D631 Anemia in chronic kidney disease: Secondary | ICD-10-CM | POA: Diagnosis not present

## 2023-06-13 DIAGNOSIS — M8580 Other specified disorders of bone density and structure, unspecified site: Secondary | ICD-10-CM | POA: Diagnosis not present

## 2023-06-13 DIAGNOSIS — R1084 Generalized abdominal pain: Secondary | ICD-10-CM | POA: Diagnosis not present

## 2023-06-13 DIAGNOSIS — I129 Hypertensive chronic kidney disease with stage 1 through stage 4 chronic kidney disease, or unspecified chronic kidney disease: Secondary | ICD-10-CM | POA: Diagnosis not present

## 2023-06-13 DIAGNOSIS — Z431 Encounter for attention to gastrostomy: Secondary | ICD-10-CM | POA: Diagnosis not present

## 2023-06-13 DIAGNOSIS — E43 Unspecified severe protein-calorie malnutrition: Secondary | ICD-10-CM | POA: Diagnosis not present

## 2023-06-13 DIAGNOSIS — R350 Frequency of micturition: Secondary | ICD-10-CM | POA: Diagnosis not present

## 2023-06-13 DIAGNOSIS — K219 Gastro-esophageal reflux disease without esophagitis: Secondary | ICD-10-CM | POA: Diagnosis not present

## 2023-06-13 DIAGNOSIS — N189 Chronic kidney disease, unspecified: Secondary | ICD-10-CM | POA: Diagnosis not present

## 2023-06-13 DIAGNOSIS — M109 Gout, unspecified: Secondary | ICD-10-CM | POA: Diagnosis not present

## 2023-06-13 DIAGNOSIS — J4489 Other specified chronic obstructive pulmonary disease: Secondary | ICD-10-CM | POA: Diagnosis not present

## 2023-06-13 DIAGNOSIS — I2510 Atherosclerotic heart disease of native coronary artery without angina pectoris: Secondary | ICD-10-CM | POA: Diagnosis not present

## 2023-06-13 DIAGNOSIS — Q2549 Other congenital malformations of aorta: Secondary | ICD-10-CM | POA: Diagnosis not present

## 2023-06-13 DIAGNOSIS — E785 Hyperlipidemia, unspecified: Secondary | ICD-10-CM | POA: Diagnosis not present

## 2023-06-13 DIAGNOSIS — H903 Sensorineural hearing loss, bilateral: Secondary | ICD-10-CM | POA: Diagnosis not present

## 2023-06-13 DIAGNOSIS — Z955 Presence of coronary angioplasty implant and graft: Secondary | ICD-10-CM | POA: Diagnosis not present

## 2023-06-13 DIAGNOSIS — R911 Solitary pulmonary nodule: Secondary | ICD-10-CM | POA: Diagnosis not present

## 2023-06-14 DIAGNOSIS — R1310 Dysphagia, unspecified: Secondary | ICD-10-CM | POA: Diagnosis not present

## 2023-06-15 DIAGNOSIS — R1310 Dysphagia, unspecified: Secondary | ICD-10-CM | POA: Diagnosis not present

## 2023-06-16 DIAGNOSIS — R1310 Dysphagia, unspecified: Secondary | ICD-10-CM | POA: Diagnosis not present

## 2023-06-16 DIAGNOSIS — N3289 Other specified disorders of bladder: Secondary | ICD-10-CM | POA: Diagnosis not present

## 2023-06-16 DIAGNOSIS — C61 Malignant neoplasm of prostate: Secondary | ICD-10-CM | POA: Diagnosis not present

## 2023-06-17 ENCOUNTER — Ambulatory Visit
Admission: RE | Admit: 2023-06-17 | Discharge: 2023-06-17 | Disposition: A | Discharge status: Home / Self Care | Payer: Medicare HMO | Source: Ambulatory Visit | Attending: Vascular Surgery | Admitting: Vascular Surgery

## 2023-06-17 DIAGNOSIS — R1310 Dysphagia, unspecified: Secondary | ICD-10-CM | POA: Diagnosis not present

## 2023-06-17 DIAGNOSIS — Q2549 Other congenital malformations of aorta: Secondary | ICD-10-CM

## 2023-06-17 MED ORDER — IOPAMIDOL (ISOVUE-370) INJECTION 76%
75.0000 mL | Freq: Once | INTRAVENOUS | Status: AC | PRN
  Administered 2023-06-17: 75 mL via INTRAVENOUS

## 2023-06-18 DIAGNOSIS — J22 Unspecified acute lower respiratory infection: Secondary | ICD-10-CM | POA: Diagnosis not present

## 2023-06-18 DIAGNOSIS — R1310 Dysphagia, unspecified: Secondary | ICD-10-CM | POA: Diagnosis not present

## 2023-06-19 DIAGNOSIS — R1310 Dysphagia, unspecified: Secondary | ICD-10-CM | POA: Diagnosis not present

## 2023-06-20 ENCOUNTER — Inpatient Hospital Stay: Payer: Medicare HMO

## 2023-06-20 ENCOUNTER — Inpatient Hospital Stay: Payer: Medicare HMO | Admitting: Oncology

## 2023-06-20 DIAGNOSIS — R1310 Dysphagia, unspecified: Secondary | ICD-10-CM | POA: Diagnosis not present

## 2023-06-21 DIAGNOSIS — R1310 Dysphagia, unspecified: Secondary | ICD-10-CM | POA: Diagnosis not present

## 2023-06-22 DIAGNOSIS — E871 Hypo-osmolality and hyponatremia: Secondary | ICD-10-CM | POA: Diagnosis not present

## 2023-06-22 DIAGNOSIS — I2510 Atherosclerotic heart disease of native coronary artery without angina pectoris: Secondary | ICD-10-CM | POA: Diagnosis not present

## 2023-06-22 DIAGNOSIS — R9431 Abnormal electrocardiogram [ECG] [EKG]: Secondary | ICD-10-CM | POA: Diagnosis not present

## 2023-06-22 DIAGNOSIS — R9389 Abnormal findings on diagnostic imaging of other specified body structures: Secondary | ICD-10-CM | POA: Diagnosis not present

## 2023-06-22 DIAGNOSIS — I491 Atrial premature depolarization: Secondary | ICD-10-CM | POA: Diagnosis not present

## 2023-06-22 DIAGNOSIS — M109 Gout, unspecified: Secondary | ICD-10-CM | POA: Diagnosis not present

## 2023-06-22 DIAGNOSIS — K9422 Gastrostomy infection: Secondary | ICD-10-CM | POA: Diagnosis not present

## 2023-06-22 DIAGNOSIS — E43 Unspecified severe protein-calorie malnutrition: Secondary | ICD-10-CM | POA: Diagnosis not present

## 2023-06-22 DIAGNOSIS — M1990 Unspecified osteoarthritis, unspecified site: Secondary | ICD-10-CM | POA: Diagnosis not present

## 2023-06-22 DIAGNOSIS — J1282 Pneumonia due to coronavirus disease 2019: Secondary | ICD-10-CM | POA: Diagnosis not present

## 2023-06-22 DIAGNOSIS — R918 Other nonspecific abnormal finding of lung field: Secondary | ICD-10-CM | POA: Diagnosis not present

## 2023-06-22 DIAGNOSIS — J984 Other disorders of lung: Secondary | ICD-10-CM | POA: Diagnosis not present

## 2023-06-22 DIAGNOSIS — I10 Essential (primary) hypertension: Secondary | ICD-10-CM | POA: Diagnosis not present

## 2023-06-22 DIAGNOSIS — G319 Degenerative disease of nervous system, unspecified: Secondary | ICD-10-CM | POA: Diagnosis not present

## 2023-06-22 DIAGNOSIS — Z931 Gastrostomy status: Secondary | ICD-10-CM | POA: Diagnosis not present

## 2023-06-22 DIAGNOSIS — I4510 Unspecified right bundle-branch block: Secondary | ICD-10-CM | POA: Diagnosis not present

## 2023-06-22 DIAGNOSIS — J690 Pneumonitis due to inhalation of food and vomit: Secondary | ICD-10-CM | POA: Diagnosis not present

## 2023-06-22 DIAGNOSIS — R911 Solitary pulmonary nodule: Secondary | ICD-10-CM | POA: Diagnosis not present

## 2023-06-22 DIAGNOSIS — R29898 Other symptoms and signs involving the musculoskeletal system: Secondary | ICD-10-CM | POA: Diagnosis not present

## 2023-06-22 DIAGNOSIS — C61 Malignant neoplasm of prostate: Secondary | ICD-10-CM | POA: Diagnosis not present

## 2023-06-22 DIAGNOSIS — U071 COVID-19: Secondary | ICD-10-CM | POA: Diagnosis not present

## 2023-06-22 DIAGNOSIS — E46 Unspecified protein-calorie malnutrition: Secondary | ICD-10-CM | POA: Diagnosis not present

## 2023-06-22 DIAGNOSIS — R0602 Shortness of breath: Secondary | ICD-10-CM | POA: Diagnosis not present

## 2023-06-22 DIAGNOSIS — J45909 Unspecified asthma, uncomplicated: Secondary | ICD-10-CM | POA: Diagnosis not present

## 2023-06-22 DIAGNOSIS — R410 Disorientation, unspecified: Secondary | ICD-10-CM | POA: Diagnosis not present

## 2023-06-22 DIAGNOSIS — Z681 Body mass index (BMI) 19 or less, adult: Secondary | ICD-10-CM | POA: Diagnosis not present

## 2023-06-22 DIAGNOSIS — M25511 Pain in right shoulder: Secondary | ICD-10-CM | POA: Diagnosis not present

## 2023-06-22 DIAGNOSIS — R109 Unspecified abdominal pain: Secondary | ICD-10-CM | POA: Diagnosis not present

## 2023-06-22 DIAGNOSIS — R509 Fever, unspecified: Secondary | ICD-10-CM | POA: Diagnosis not present

## 2023-06-22 DIAGNOSIS — R1310 Dysphagia, unspecified: Secondary | ICD-10-CM | POA: Diagnosis not present

## 2023-06-22 DIAGNOSIS — M19011 Primary osteoarthritis, right shoulder: Secondary | ICD-10-CM | POA: Diagnosis not present

## 2023-06-23 ENCOUNTER — Encounter (HOSPITAL_COMMUNITY): Payer: Self-pay | Admitting: Vascular Surgery

## 2023-06-23 DIAGNOSIS — R1310 Dysphagia, unspecified: Secondary | ICD-10-CM | POA: Diagnosis not present

## 2023-06-24 ENCOUNTER — Ambulatory Visit: Payer: Medicare HMO | Admitting: Internal Medicine

## 2023-06-24 DIAGNOSIS — R1310 Dysphagia, unspecified: Secondary | ICD-10-CM | POA: Diagnosis not present

## 2023-06-25 DIAGNOSIS — U071 COVID-19: Secondary | ICD-10-CM | POA: Diagnosis not present

## 2023-06-25 DIAGNOSIS — R410 Disorientation, unspecified: Secondary | ICD-10-CM | POA: Diagnosis not present

## 2023-06-25 DIAGNOSIS — R29898 Other symptoms and signs involving the musculoskeletal system: Secondary | ICD-10-CM | POA: Diagnosis not present

## 2023-06-25 DIAGNOSIS — R1310 Dysphagia, unspecified: Secondary | ICD-10-CM | POA: Diagnosis not present

## 2023-06-26 DIAGNOSIS — R9389 Abnormal findings on diagnostic imaging of other specified body structures: Secondary | ICD-10-CM | POA: Diagnosis not present

## 2023-06-26 DIAGNOSIS — R509 Fever, unspecified: Secondary | ICD-10-CM | POA: Diagnosis not present

## 2023-06-26 DIAGNOSIS — G319 Degenerative disease of nervous system, unspecified: Secondary | ICD-10-CM | POA: Diagnosis not present

## 2023-06-26 DIAGNOSIS — R29898 Other symptoms and signs involving the musculoskeletal system: Secondary | ICD-10-CM | POA: Diagnosis not present

## 2023-06-26 DIAGNOSIS — R918 Other nonspecific abnormal finding of lung field: Secondary | ICD-10-CM | POA: Diagnosis not present

## 2023-06-26 DIAGNOSIS — J984 Other disorders of lung: Secondary | ICD-10-CM | POA: Diagnosis not present

## 2023-06-26 DIAGNOSIS — R1310 Dysphagia, unspecified: Secondary | ICD-10-CM | POA: Diagnosis not present

## 2023-06-27 ENCOUNTER — Other Ambulatory Visit (HOSPITAL_COMMUNITY): Payer: Self-pay | Admitting: Urology

## 2023-06-27 DIAGNOSIS — R509 Fever, unspecified: Secondary | ICD-10-CM | POA: Diagnosis not present

## 2023-06-27 DIAGNOSIS — C61 Malignant neoplasm of prostate: Secondary | ICD-10-CM

## 2023-06-28 DIAGNOSIS — M19011 Primary osteoarthritis, right shoulder: Secondary | ICD-10-CM | POA: Diagnosis not present

## 2023-06-28 DIAGNOSIS — M25511 Pain in right shoulder: Secondary | ICD-10-CM | POA: Diagnosis not present

## 2023-06-30 ENCOUNTER — Telehealth: Payer: Self-pay | Admitting: Oncology

## 2023-06-30 NOTE — Telephone Encounter (Signed)
06/30/23 Wife cancelled appt(patient is in hospital)They will call to reschedule once discharged.

## 2023-07-01 ENCOUNTER — Encounter (HOSPITAL_COMMUNITY): Payer: Self-pay | Admitting: Vascular Surgery

## 2023-07-01 ENCOUNTER — Inpatient Hospital Stay: Payer: Medicare HMO

## 2023-07-01 ENCOUNTER — Inpatient Hospital Stay: Payer: Medicare HMO | Admitting: Oncology

## 2023-07-02 ENCOUNTER — Ambulatory Visit: Payer: Medicare HMO | Admitting: Vascular Surgery

## 2023-07-02 DIAGNOSIS — M6281 Muscle weakness (generalized): Secondary | ICD-10-CM | POA: Diagnosis not present

## 2023-07-02 DIAGNOSIS — N400 Enlarged prostate without lower urinary tract symptoms: Secondary | ICD-10-CM | POA: Diagnosis not present

## 2023-07-02 DIAGNOSIS — M79641 Pain in right hand: Secondary | ICD-10-CM | POA: Diagnosis not present

## 2023-07-02 DIAGNOSIS — M109 Gout, unspecified: Secondary | ICD-10-CM | POA: Diagnosis not present

## 2023-07-02 DIAGNOSIS — Z741 Need for assistance with personal care: Secondary | ICD-10-CM | POA: Diagnosis not present

## 2023-07-02 DIAGNOSIS — R1310 Dysphagia, unspecified: Secondary | ICD-10-CM | POA: Diagnosis not present

## 2023-07-02 DIAGNOSIS — R410 Disorientation, unspecified: Secondary | ICD-10-CM | POA: Diagnosis not present

## 2023-07-02 DIAGNOSIS — Z5181 Encounter for therapeutic drug level monitoring: Secondary | ICD-10-CM | POA: Diagnosis not present

## 2023-07-02 DIAGNOSIS — M79643 Pain in unspecified hand: Secondary | ICD-10-CM | POA: Diagnosis not present

## 2023-07-02 DIAGNOSIS — I10 Essential (primary) hypertension: Secondary | ICD-10-CM | POA: Diagnosis not present

## 2023-07-02 DIAGNOSIS — J45909 Unspecified asthma, uncomplicated: Secondary | ICD-10-CM | POA: Diagnosis not present

## 2023-07-02 DIAGNOSIS — I2510 Atherosclerotic heart disease of native coronary artery without angina pectoris: Secondary | ICD-10-CM | POA: Diagnosis not present

## 2023-07-02 DIAGNOSIS — Z931 Gastrostomy status: Secondary | ICD-10-CM | POA: Diagnosis not present

## 2023-07-02 DIAGNOSIS — U071 COVID-19: Secondary | ICD-10-CM | POA: Diagnosis not present

## 2023-07-02 DIAGNOSIS — Z7409 Other reduced mobility: Secondary | ICD-10-CM | POA: Diagnosis not present

## 2023-07-02 DIAGNOSIS — J690 Pneumonitis due to inhalation of food and vomit: Secondary | ICD-10-CM | POA: Diagnosis not present

## 2023-07-02 DIAGNOSIS — J309 Allergic rhinitis, unspecified: Secondary | ICD-10-CM | POA: Diagnosis not present

## 2023-07-02 DIAGNOSIS — E46 Unspecified protein-calorie malnutrition: Secondary | ICD-10-CM | POA: Diagnosis not present

## 2023-07-02 DIAGNOSIS — R531 Weakness: Secondary | ICD-10-CM | POA: Diagnosis not present

## 2023-07-02 DIAGNOSIS — J189 Pneumonia, unspecified organism: Secondary | ICD-10-CM | POA: Diagnosis not present

## 2023-07-02 DIAGNOSIS — M2550 Pain in unspecified joint: Secondary | ICD-10-CM | POA: Diagnosis not present

## 2023-07-02 DIAGNOSIS — M1990 Unspecified osteoarthritis, unspecified site: Secondary | ICD-10-CM | POA: Diagnosis not present

## 2023-07-02 DIAGNOSIS — B342 Coronavirus infection, unspecified: Secondary | ICD-10-CM | POA: Diagnosis not present

## 2023-07-02 DIAGNOSIS — E43 Unspecified severe protein-calorie malnutrition: Secondary | ICD-10-CM | POA: Diagnosis not present

## 2023-07-02 DIAGNOSIS — Z7401 Bed confinement status: Secondary | ICD-10-CM | POA: Diagnosis not present

## 2023-07-02 DIAGNOSIS — J1282 Pneumonia due to coronavirus disease 2019: Secondary | ICD-10-CM | POA: Diagnosis not present

## 2023-07-02 DIAGNOSIS — R2681 Unsteadiness on feet: Secondary | ICD-10-CM | POA: Diagnosis not present

## 2023-07-02 DIAGNOSIS — E785 Hyperlipidemia, unspecified: Secondary | ICD-10-CM | POA: Diagnosis not present

## 2023-07-02 DIAGNOSIS — R1312 Dysphagia, oropharyngeal phase: Secondary | ICD-10-CM | POA: Diagnosis not present

## 2023-07-03 DIAGNOSIS — I2510 Atherosclerotic heart disease of native coronary artery without angina pectoris: Secondary | ICD-10-CM | POA: Diagnosis not present

## 2023-07-03 DIAGNOSIS — N400 Enlarged prostate without lower urinary tract symptoms: Secondary | ICD-10-CM | POA: Diagnosis not present

## 2023-07-03 DIAGNOSIS — J309 Allergic rhinitis, unspecified: Secondary | ICD-10-CM | POA: Diagnosis not present

## 2023-07-07 DIAGNOSIS — M2550 Pain in unspecified joint: Secondary | ICD-10-CM | POA: Diagnosis not present

## 2023-07-07 DIAGNOSIS — M79643 Pain in unspecified hand: Secondary | ICD-10-CM | POA: Diagnosis not present

## 2023-07-07 DIAGNOSIS — J690 Pneumonitis due to inhalation of food and vomit: Secondary | ICD-10-CM | POA: Diagnosis not present

## 2023-07-07 DIAGNOSIS — Z5181 Encounter for therapeutic drug level monitoring: Secondary | ICD-10-CM | POA: Diagnosis not present

## 2023-07-08 DIAGNOSIS — E119 Type 2 diabetes mellitus without complications: Secondary | ICD-10-CM | POA: Diagnosis not present

## 2023-07-08 DIAGNOSIS — Z79899 Other long term (current) drug therapy: Secondary | ICD-10-CM | POA: Diagnosis not present

## 2023-07-09 DIAGNOSIS — M1990 Unspecified osteoarthritis, unspecified site: Secondary | ICD-10-CM | POA: Diagnosis not present

## 2023-07-09 DIAGNOSIS — E785 Hyperlipidemia, unspecified: Secondary | ICD-10-CM | POA: Diagnosis not present

## 2023-07-09 DIAGNOSIS — Z5181 Encounter for therapeutic drug level monitoring: Secondary | ICD-10-CM | POA: Diagnosis not present

## 2023-07-15 DIAGNOSIS — I10 Essential (primary) hypertension: Secondary | ICD-10-CM | POA: Diagnosis not present

## 2023-07-16 DIAGNOSIS — R1310 Dysphagia, unspecified: Secondary | ICD-10-CM | POA: Diagnosis not present

## 2023-07-16 DIAGNOSIS — J309 Allergic rhinitis, unspecified: Secondary | ICD-10-CM | POA: Diagnosis not present

## 2023-07-16 DIAGNOSIS — N400 Enlarged prostate without lower urinary tract symptoms: Secondary | ICD-10-CM | POA: Diagnosis not present

## 2023-07-16 DIAGNOSIS — I2510 Atherosclerotic heart disease of native coronary artery without angina pectoris: Secondary | ICD-10-CM | POA: Diagnosis not present

## 2023-07-17 DIAGNOSIS — M109 Gout, unspecified: Secondary | ICD-10-CM | POA: Diagnosis not present

## 2023-07-17 DIAGNOSIS — I129 Hypertensive chronic kidney disease with stage 1 through stage 4 chronic kidney disease, or unspecified chronic kidney disease: Secondary | ICD-10-CM | POA: Diagnosis not present

## 2023-07-17 DIAGNOSIS — Z8701 Personal history of pneumonia (recurrent): Secondary | ICD-10-CM | POA: Diagnosis not present

## 2023-07-17 DIAGNOSIS — Z955 Presence of coronary angioplasty implant and graft: Secondary | ICD-10-CM | POA: Diagnosis not present

## 2023-07-17 DIAGNOSIS — Z7982 Long term (current) use of aspirin: Secondary | ICD-10-CM | POA: Diagnosis not present

## 2023-07-17 DIAGNOSIS — R911 Solitary pulmonary nodule: Secondary | ICD-10-CM | POA: Diagnosis not present

## 2023-07-17 DIAGNOSIS — H903 Sensorineural hearing loss, bilateral: Secondary | ICD-10-CM | POA: Diagnosis not present

## 2023-07-17 DIAGNOSIS — Z431 Encounter for attention to gastrostomy: Secondary | ICD-10-CM | POA: Diagnosis not present

## 2023-07-17 DIAGNOSIS — G250 Essential tremor: Secondary | ICD-10-CM | POA: Diagnosis not present

## 2023-07-17 DIAGNOSIS — Z7951 Long term (current) use of inhaled steroids: Secondary | ICD-10-CM | POA: Diagnosis not present

## 2023-07-17 DIAGNOSIS — I2510 Atherosclerotic heart disease of native coronary artery without angina pectoris: Secondary | ICD-10-CM | POA: Diagnosis not present

## 2023-07-17 DIAGNOSIS — Q2549 Other congenital malformations of aorta: Secondary | ICD-10-CM | POA: Diagnosis not present

## 2023-07-17 DIAGNOSIS — J4489 Other specified chronic obstructive pulmonary disease: Secondary | ICD-10-CM | POA: Diagnosis not present

## 2023-07-17 DIAGNOSIS — D631 Anemia in chronic kidney disease: Secondary | ICD-10-CM | POA: Diagnosis not present

## 2023-07-17 DIAGNOSIS — K5900 Constipation, unspecified: Secondary | ICD-10-CM | POA: Diagnosis not present

## 2023-07-17 DIAGNOSIS — N189 Chronic kidney disease, unspecified: Secondary | ICD-10-CM | POA: Diagnosis not present

## 2023-07-17 DIAGNOSIS — K219 Gastro-esophageal reflux disease without esophagitis: Secondary | ICD-10-CM | POA: Diagnosis not present

## 2023-07-17 DIAGNOSIS — E785 Hyperlipidemia, unspecified: Secondary | ICD-10-CM | POA: Diagnosis not present

## 2023-07-17 DIAGNOSIS — E43 Unspecified severe protein-calorie malnutrition: Secondary | ICD-10-CM | POA: Diagnosis not present

## 2023-07-17 DIAGNOSIS — Z79899 Other long term (current) drug therapy: Secondary | ICD-10-CM | POA: Diagnosis not present

## 2023-07-17 DIAGNOSIS — M8580 Other specified disorders of bone density and structure, unspecified site: Secondary | ICD-10-CM | POA: Diagnosis not present

## 2023-07-17 DIAGNOSIS — R1312 Dysphagia, oropharyngeal phase: Secondary | ICD-10-CM | POA: Diagnosis not present

## 2023-07-18 DIAGNOSIS — M109 Gout, unspecified: Secondary | ICD-10-CM | POA: Diagnosis not present

## 2023-07-18 DIAGNOSIS — M8580 Other specified disorders of bone density and structure, unspecified site: Secondary | ICD-10-CM | POA: Diagnosis not present

## 2023-07-18 DIAGNOSIS — E43 Unspecified severe protein-calorie malnutrition: Secondary | ICD-10-CM | POA: Diagnosis not present

## 2023-07-18 DIAGNOSIS — I129 Hypertensive chronic kidney disease with stage 1 through stage 4 chronic kidney disease, or unspecified chronic kidney disease: Secondary | ICD-10-CM | POA: Diagnosis not present

## 2023-07-18 DIAGNOSIS — Q2549 Other congenital malformations of aorta: Secondary | ICD-10-CM | POA: Diagnosis not present

## 2023-07-18 DIAGNOSIS — Z955 Presence of coronary angioplasty implant and graft: Secondary | ICD-10-CM | POA: Diagnosis not present

## 2023-07-18 DIAGNOSIS — K5900 Constipation, unspecified: Secondary | ICD-10-CM | POA: Diagnosis not present

## 2023-07-18 DIAGNOSIS — G250 Essential tremor: Secondary | ICD-10-CM | POA: Diagnosis not present

## 2023-07-18 DIAGNOSIS — Z79899 Other long term (current) drug therapy: Secondary | ICD-10-CM | POA: Diagnosis not present

## 2023-07-18 DIAGNOSIS — I2510 Atherosclerotic heart disease of native coronary artery without angina pectoris: Secondary | ICD-10-CM | POA: Diagnosis not present

## 2023-07-18 DIAGNOSIS — Z8701 Personal history of pneumonia (recurrent): Secondary | ICD-10-CM | POA: Diagnosis not present

## 2023-07-18 DIAGNOSIS — J4489 Other specified chronic obstructive pulmonary disease: Secondary | ICD-10-CM | POA: Diagnosis not present

## 2023-07-18 DIAGNOSIS — D631 Anemia in chronic kidney disease: Secondary | ICD-10-CM | POA: Diagnosis not present

## 2023-07-18 DIAGNOSIS — H903 Sensorineural hearing loss, bilateral: Secondary | ICD-10-CM | POA: Diagnosis not present

## 2023-07-18 DIAGNOSIS — Z7951 Long term (current) use of inhaled steroids: Secondary | ICD-10-CM | POA: Diagnosis not present

## 2023-07-18 DIAGNOSIS — Z7982 Long term (current) use of aspirin: Secondary | ICD-10-CM | POA: Diagnosis not present

## 2023-07-18 DIAGNOSIS — R911 Solitary pulmonary nodule: Secondary | ICD-10-CM | POA: Diagnosis not present

## 2023-07-18 DIAGNOSIS — Z431 Encounter for attention to gastrostomy: Secondary | ICD-10-CM | POA: Diagnosis not present

## 2023-07-18 DIAGNOSIS — E785 Hyperlipidemia, unspecified: Secondary | ICD-10-CM | POA: Diagnosis not present

## 2023-07-18 DIAGNOSIS — K219 Gastro-esophageal reflux disease without esophagitis: Secondary | ICD-10-CM | POA: Diagnosis not present

## 2023-07-18 DIAGNOSIS — R1312 Dysphagia, oropharyngeal phase: Secondary | ICD-10-CM | POA: Diagnosis not present

## 2023-07-18 DIAGNOSIS — N189 Chronic kidney disease, unspecified: Secondary | ICD-10-CM | POA: Diagnosis not present

## 2023-07-22 DIAGNOSIS — E785 Hyperlipidemia, unspecified: Secondary | ICD-10-CM | POA: Diagnosis not present

## 2023-07-22 DIAGNOSIS — M109 Gout, unspecified: Secondary | ICD-10-CM | POA: Diagnosis not present

## 2023-07-22 DIAGNOSIS — I2510 Atherosclerotic heart disease of native coronary artery without angina pectoris: Secondary | ICD-10-CM | POA: Diagnosis not present

## 2023-07-22 DIAGNOSIS — Z7951 Long term (current) use of inhaled steroids: Secondary | ICD-10-CM | POA: Diagnosis not present

## 2023-07-22 DIAGNOSIS — Z79899 Other long term (current) drug therapy: Secondary | ICD-10-CM | POA: Diagnosis not present

## 2023-07-22 DIAGNOSIS — I129 Hypertensive chronic kidney disease with stage 1 through stage 4 chronic kidney disease, or unspecified chronic kidney disease: Secondary | ICD-10-CM | POA: Diagnosis not present

## 2023-07-22 DIAGNOSIS — R911 Solitary pulmonary nodule: Secondary | ICD-10-CM | POA: Diagnosis not present

## 2023-07-22 DIAGNOSIS — Q2549 Other congenital malformations of aorta: Secondary | ICD-10-CM | POA: Diagnosis not present

## 2023-07-22 DIAGNOSIS — N189 Chronic kidney disease, unspecified: Secondary | ICD-10-CM | POA: Diagnosis not present

## 2023-07-22 DIAGNOSIS — H903 Sensorineural hearing loss, bilateral: Secondary | ICD-10-CM | POA: Diagnosis not present

## 2023-07-22 DIAGNOSIS — K5900 Constipation, unspecified: Secondary | ICD-10-CM | POA: Diagnosis not present

## 2023-07-22 DIAGNOSIS — R1312 Dysphagia, oropharyngeal phase: Secondary | ICD-10-CM | POA: Diagnosis not present

## 2023-07-22 DIAGNOSIS — Z7982 Long term (current) use of aspirin: Secondary | ICD-10-CM | POA: Diagnosis not present

## 2023-07-22 DIAGNOSIS — D631 Anemia in chronic kidney disease: Secondary | ICD-10-CM | POA: Diagnosis not present

## 2023-07-22 DIAGNOSIS — J4489 Other specified chronic obstructive pulmonary disease: Secondary | ICD-10-CM | POA: Diagnosis not present

## 2023-07-22 DIAGNOSIS — M8580 Other specified disorders of bone density and structure, unspecified site: Secondary | ICD-10-CM | POA: Diagnosis not present

## 2023-07-22 DIAGNOSIS — Z955 Presence of coronary angioplasty implant and graft: Secondary | ICD-10-CM | POA: Diagnosis not present

## 2023-07-22 DIAGNOSIS — Z431 Encounter for attention to gastrostomy: Secondary | ICD-10-CM | POA: Diagnosis not present

## 2023-07-22 DIAGNOSIS — G250 Essential tremor: Secondary | ICD-10-CM | POA: Diagnosis not present

## 2023-07-22 DIAGNOSIS — K219 Gastro-esophageal reflux disease without esophagitis: Secondary | ICD-10-CM | POA: Diagnosis not present

## 2023-07-22 DIAGNOSIS — Z8701 Personal history of pneumonia (recurrent): Secondary | ICD-10-CM | POA: Diagnosis not present

## 2023-07-22 DIAGNOSIS — E43 Unspecified severe protein-calorie malnutrition: Secondary | ICD-10-CM | POA: Diagnosis not present

## 2023-07-24 ENCOUNTER — Telehealth: Payer: Self-pay

## 2023-07-24 ENCOUNTER — Inpatient Hospital Stay: Payer: Medicare HMO

## 2023-07-24 ENCOUNTER — Inpatient Hospital Stay: Payer: Medicare HMO | Attending: Oncology | Admitting: Oncology

## 2023-07-24 ENCOUNTER — Other Ambulatory Visit: Payer: Self-pay | Admitting: Oncology

## 2023-07-24 VITALS — BP 143/60 | HR 92 | Temp 98.3°F | Resp 16 | Ht 70.0 in | Wt 126.8 lb

## 2023-07-24 DIAGNOSIS — D631 Anemia in chronic kidney disease: Secondary | ICD-10-CM | POA: Diagnosis not present

## 2023-07-24 DIAGNOSIS — N189 Chronic kidney disease, unspecified: Secondary | ICD-10-CM | POA: Diagnosis not present

## 2023-07-24 DIAGNOSIS — N182 Chronic kidney disease, stage 2 (mild): Secondary | ICD-10-CM | POA: Insufficient documentation

## 2023-07-24 DIAGNOSIS — D649 Anemia, unspecified: Secondary | ICD-10-CM | POA: Diagnosis not present

## 2023-07-24 LAB — CBC AND DIFFERENTIAL
HCT: 28 — AB (ref 41–53)
Hemoglobin: 9.3 — AB (ref 13.5–17.5)
Neutrophils Absolute: 3.35
Platelets: 168 10*3/uL (ref 150–400)
WBC: 5

## 2023-07-24 LAB — CMP (CANCER CENTER ONLY)
ALT: 15 U/L (ref 0–44)
AST: 20 U/L (ref 15–41)
Albumin: 3.4 g/dL — ABNORMAL LOW (ref 3.5–5.0)
Alkaline Phosphatase: 45 U/L (ref 38–126)
Anion gap: 9 (ref 5–15)
BUN: 30 mg/dL — ABNORMAL HIGH (ref 8–23)
CO2: 26 mmol/L (ref 22–32)
Calcium: 8.9 mg/dL (ref 8.9–10.3)
Chloride: 95 mmol/L — ABNORMAL LOW (ref 98–111)
Creatinine: 0.77 mg/dL (ref 0.61–1.24)
GFR, Estimated: >60 mL/min (ref >60)
Glucose, Bld: 152 mg/dL — ABNORMAL HIGH (ref 70–99)
Potassium: 4.6 mmol/L (ref 3.5–5.1)
Sodium: 130 mmol/L — ABNORMAL LOW (ref 135–145)
Total Bilirubin: 0.5 mg/dL (ref 0.3–1.2)
Total Protein: 6.6 g/dL (ref 6.5–8.1)

## 2023-07-24 LAB — IRON AND TIBC
Iron: 50 ug/dL (ref 45–182)
Saturation Ratios: 17 % — ABNORMAL LOW (ref 17.9–39.5)
TIBC: 304 ug/dL (ref 250–450)
UIBC: 254 ug/dL

## 2023-07-24 LAB — FERRITIN: Ferritin: 201 ng/mL (ref 24–336)

## 2023-07-24 LAB — CBC: RBC: 2.9 — AB (ref 3.87–5.11)

## 2023-07-24 NOTE — Progress Notes (Signed)
[facility]  [address] [place],  [place]  [address] [phone]  Clinic Day:  07/24/2023  Referring physician: [facility], Cheryln Manly F*  HISTORY OF PRESENT ILLNESS:  The patient is a 82 y.o. male with anemia secondary to mild renal insufficiency.  Despite having anemia, his hemoglobin has consistently been above 10 to where it has been followed without needing any particular intervention.  He comes in today for routine follow up.  Since his last visit, the patient has undergone significant vascular surgery for Kommerell's diverticulum from which he has been recovering fairly slowly.      PHYSICAL EXAM:  Blood pressure (!) 143/60, pulse 92, temperature 98.3 F (36.8 C), resp. rate 16, height 5\' 10"  (1.778 m), weight 126 lb 12.8 oz (57.5 kg), SpO2 97%.  Body mass index is 18.19 kg/m. Performance status (ECOG): 1 Physical Exam Constitutional:      Appearance: He is not ill-appearing.     Comments: He looks thin and has a chronically ill appearance  HENT:     Mouth/Throat:     Mouth: Mucous membranes are moist.     Pharynx: Oropharynx is clear. No oropharyngeal exudate or posterior oropharyngeal erythema.  Cardiovascular:     Rate and Rhythm: Normal rate and regular rhythm.     Heart sounds: No murmur heard.    No friction rub. No gallop.  Pulmonary:     Effort: Pulmonary effort is normal. No respiratory distress.     Breath sounds: Normal breath sounds. No wheezing, rhonchi or rales.  Abdominal:     General: Bowel sounds are normal. There is no distension.     Palpations: Abdomen is soft. There is no mass.     Tenderness: There is no abdominal tenderness.  Musculoskeletal:        General: No swelling.     Right lower leg: No edema.     Left lower leg: No edema.  Lymphadenopathy:     Cervical: No cervical adenopathy.     Upper Body:     Right upper body: No supraclavicular or axillary adenopathy.     Left upper body: No supraclavicular  or axillary adenopathy.     Lower Body: No right inguinal adenopathy. No left inguinal adenopathy.  Skin:    General: Skin is warm.     Coloration: Skin is not jaundiced.     Findings: No lesion or rash.  Neurological:     General: No focal deficit present.     Mental Status: He is alert and oriented to person, place, and time. Mental status is at baseline.  Psychiatric:        Mood and Affect: Mood normal.        Behavior: Behavior normal.        Thought Content: Thought content normal.    LABS:    Latest Reference Range & Units 07/24/23 00:00 07/24/23 13:19 07/24/23 13:20  Sodium 135 - 145 mmol/L   130 (L)  Potassium 3.5 - 5.1 mmol/L   4.6  Chloride 98 - 111 mmol/L   95 (L)  CO2 22 - 32 mmol/L   26  Glucose 70 - 99 mg/dL   914 (H)  BUN 8 - 23 mg/dL   30 (H)  Creatinine 7.82 - 1.24 mg/dL   9.56  Calcium 8.9 - 21.3 mg/dL   8.9  Anion gap 5 - 15    9  Alkaline Phosphatase 38 - 126 U/L   45  Albumin  3.5 - 5.0 g/dL   3.4 (L)  AST 15 - 41 U/L   20  ALT 0 - 44 U/L   15  Total Protein 6.5 - 8.1 g/dL   6.6  Total Bilirubin 0.3 - 1.2 mg/dL   0.5  GFR, Est Non African American >60 mL/min   >60  Iron 45 - 182 ug/dL  50   UIBC ug/dL  161   TIBC 096 - 045 ug/dL  409   Saturation Ratios 17.9 - 39.5 %  17 (L)   Ferritin 24 - 336 ng/mL  201   CBC W DIFFERENTIAL ([facility] SCANNED REPORT)    Rpt (IP)  WBC  5.0 (E)    RBC 3.87 - 5.11  2.90 ! (E)    Hemoglobin 13.5 - 17.5  9.3 ! (E)    HCT 41 - 53  28 ! (E)    Platelets 150 - 400 K/uL 168 (E)    NEUT#  3.35 (E)    (L): Data is abnormally low (H): Data is abnormally high !: Data is abnormal  ASSESSMENT & PLAN:  Assessment/Plan:  A 82 y.o. male with anemia secondary to mild renal insufficiency.  His hemoglobin of 9.3 is lower than what it has been previously.  Based upon this, I will initiate monthly red cell shots, with the goal to get his hemoglobin to/above 10.  I will see him back in 3 months to reassess his hemoglobin to see how  well he responded to his upcoming red cell injections. The patient understands all the plans discussed today and is in agreement with them.    Tyrika Newman Kirby Funk, MD

## 2023-07-24 NOTE — Telephone Encounter (Addendum)
07/25/2023- Dr Melvyn Neth reviewed labs. He recommends pt start retacrit injections once monthly for next few months.  Latest Reference Range & Units 07/24/23 13:19  Iron 45 - 182 ug/dL 50  UIBC ug/dL 562  TIBC 130 - 865 ug/dL 784  Saturation Ratios 17.9 - 39.5 % 17 (L)  Ferritin 24 - 336 ng/mL 201  (L): Data is abnormally low  07/24/2023 @ 1606- iron panel results not ready.

## 2023-07-25 DIAGNOSIS — G250 Essential tremor: Secondary | ICD-10-CM | POA: Diagnosis not present

## 2023-07-25 DIAGNOSIS — N189 Chronic kidney disease, unspecified: Secondary | ICD-10-CM | POA: Diagnosis not present

## 2023-07-25 DIAGNOSIS — D631 Anemia in chronic kidney disease: Secondary | ICD-10-CM | POA: Diagnosis not present

## 2023-07-25 DIAGNOSIS — Z7951 Long term (current) use of inhaled steroids: Secondary | ICD-10-CM | POA: Diagnosis not present

## 2023-07-25 DIAGNOSIS — J4489 Other specified chronic obstructive pulmonary disease: Secondary | ICD-10-CM | POA: Diagnosis not present

## 2023-07-25 DIAGNOSIS — I2510 Atherosclerotic heart disease of native coronary artery without angina pectoris: Secondary | ICD-10-CM | POA: Diagnosis not present

## 2023-07-25 DIAGNOSIS — E785 Hyperlipidemia, unspecified: Secondary | ICD-10-CM | POA: Diagnosis not present

## 2023-07-25 DIAGNOSIS — R911 Solitary pulmonary nodule: Secondary | ICD-10-CM | POA: Diagnosis not present

## 2023-07-25 DIAGNOSIS — R1312 Dysphagia, oropharyngeal phase: Secondary | ICD-10-CM | POA: Diagnosis not present

## 2023-07-25 DIAGNOSIS — Z23 Encounter for immunization: Secondary | ICD-10-CM | POA: Diagnosis not present

## 2023-07-25 DIAGNOSIS — K219 Gastro-esophageal reflux disease without esophagitis: Secondary | ICD-10-CM | POA: Diagnosis not present

## 2023-07-25 DIAGNOSIS — D649 Anemia, unspecified: Secondary | ICD-10-CM | POA: Diagnosis not present

## 2023-07-25 DIAGNOSIS — M109 Gout, unspecified: Secondary | ICD-10-CM | POA: Diagnosis not present

## 2023-07-25 DIAGNOSIS — E43 Unspecified severe protein-calorie malnutrition: Secondary | ICD-10-CM | POA: Diagnosis not present

## 2023-07-25 DIAGNOSIS — Z431 Encounter for attention to gastrostomy: Secondary | ICD-10-CM | POA: Diagnosis not present

## 2023-07-25 DIAGNOSIS — I129 Hypertensive chronic kidney disease with stage 1 through stage 4 chronic kidney disease, or unspecified chronic kidney disease: Secondary | ICD-10-CM | POA: Diagnosis not present

## 2023-07-25 DIAGNOSIS — K5900 Constipation, unspecified: Secondary | ICD-10-CM | POA: Diagnosis not present

## 2023-07-25 DIAGNOSIS — H903 Sensorineural hearing loss, bilateral: Secondary | ICD-10-CM | POA: Diagnosis not present

## 2023-07-25 DIAGNOSIS — Z79899 Other long term (current) drug therapy: Secondary | ICD-10-CM | POA: Diagnosis not present

## 2023-07-25 DIAGNOSIS — Z7982 Long term (current) use of aspirin: Secondary | ICD-10-CM | POA: Diagnosis not present

## 2023-07-25 DIAGNOSIS — Q2549 Other congenital malformations of aorta: Secondary | ICD-10-CM | POA: Diagnosis not present

## 2023-07-25 DIAGNOSIS — Z955 Presence of coronary angioplasty implant and graft: Secondary | ICD-10-CM | POA: Diagnosis not present

## 2023-07-25 DIAGNOSIS — M8580 Other specified disorders of bone density and structure, unspecified site: Secondary | ICD-10-CM | POA: Diagnosis not present

## 2023-07-25 DIAGNOSIS — Z8701 Personal history of pneumonia (recurrent): Secondary | ICD-10-CM | POA: Diagnosis not present

## 2023-07-25 LAB — LAB REPORT - SCANNED: EGFR: 60

## 2023-07-26 DIAGNOSIS — R1310 Dysphagia, unspecified: Secondary | ICD-10-CM | POA: Diagnosis not present

## 2023-07-28 DIAGNOSIS — Z431 Encounter for attention to gastrostomy: Secondary | ICD-10-CM | POA: Diagnosis not present

## 2023-07-28 DIAGNOSIS — M109 Gout, unspecified: Secondary | ICD-10-CM | POA: Diagnosis not present

## 2023-07-28 DIAGNOSIS — R911 Solitary pulmonary nodule: Secondary | ICD-10-CM | POA: Diagnosis not present

## 2023-07-28 DIAGNOSIS — G250 Essential tremor: Secondary | ICD-10-CM | POA: Diagnosis not present

## 2023-07-28 DIAGNOSIS — M8580 Other specified disorders of bone density and structure, unspecified site: Secondary | ICD-10-CM | POA: Diagnosis not present

## 2023-07-28 DIAGNOSIS — I2510 Atherosclerotic heart disease of native coronary artery without angina pectoris: Secondary | ICD-10-CM | POA: Diagnosis not present

## 2023-07-28 DIAGNOSIS — N189 Chronic kidney disease, unspecified: Secondary | ICD-10-CM | POA: Diagnosis not present

## 2023-07-28 DIAGNOSIS — E43 Unspecified severe protein-calorie malnutrition: Secondary | ICD-10-CM | POA: Diagnosis not present

## 2023-07-28 DIAGNOSIS — Q2549 Other congenital malformations of aorta: Secondary | ICD-10-CM | POA: Diagnosis not present

## 2023-07-28 DIAGNOSIS — H903 Sensorineural hearing loss, bilateral: Secondary | ICD-10-CM | POA: Diagnosis not present

## 2023-07-28 DIAGNOSIS — Z8701 Personal history of pneumonia (recurrent): Secondary | ICD-10-CM | POA: Diagnosis not present

## 2023-07-28 DIAGNOSIS — E785 Hyperlipidemia, unspecified: Secondary | ICD-10-CM | POA: Diagnosis not present

## 2023-07-28 DIAGNOSIS — Z79899 Other long term (current) drug therapy: Secondary | ICD-10-CM | POA: Diagnosis not present

## 2023-07-28 DIAGNOSIS — Z955 Presence of coronary angioplasty implant and graft: Secondary | ICD-10-CM | POA: Diagnosis not present

## 2023-07-28 DIAGNOSIS — Z7982 Long term (current) use of aspirin: Secondary | ICD-10-CM | POA: Diagnosis not present

## 2023-07-28 DIAGNOSIS — K5900 Constipation, unspecified: Secondary | ICD-10-CM | POA: Diagnosis not present

## 2023-07-28 DIAGNOSIS — J4489 Other specified chronic obstructive pulmonary disease: Secondary | ICD-10-CM | POA: Diagnosis not present

## 2023-07-28 DIAGNOSIS — K219 Gastro-esophageal reflux disease without esophagitis: Secondary | ICD-10-CM | POA: Diagnosis not present

## 2023-07-28 DIAGNOSIS — I129 Hypertensive chronic kidney disease with stage 1 through stage 4 chronic kidney disease, or unspecified chronic kidney disease: Secondary | ICD-10-CM | POA: Diagnosis not present

## 2023-07-28 DIAGNOSIS — R1312 Dysphagia, oropharyngeal phase: Secondary | ICD-10-CM | POA: Diagnosis not present

## 2023-07-28 DIAGNOSIS — D631 Anemia in chronic kidney disease: Secondary | ICD-10-CM | POA: Diagnosis not present

## 2023-07-28 DIAGNOSIS — Z7951 Long term (current) use of inhaled steroids: Secondary | ICD-10-CM | POA: Diagnosis not present

## 2023-07-29 DIAGNOSIS — M8580 Other specified disorders of bone density and structure, unspecified site: Secondary | ICD-10-CM | POA: Diagnosis not present

## 2023-07-29 DIAGNOSIS — R911 Solitary pulmonary nodule: Secondary | ICD-10-CM | POA: Diagnosis not present

## 2023-07-29 DIAGNOSIS — E785 Hyperlipidemia, unspecified: Secondary | ICD-10-CM | POA: Diagnosis not present

## 2023-07-29 DIAGNOSIS — N189 Chronic kidney disease, unspecified: Secondary | ICD-10-CM | POA: Diagnosis not present

## 2023-07-29 DIAGNOSIS — J4489 Other specified chronic obstructive pulmonary disease: Secondary | ICD-10-CM | POA: Diagnosis not present

## 2023-07-29 DIAGNOSIS — M109 Gout, unspecified: Secondary | ICD-10-CM | POA: Diagnosis not present

## 2023-07-29 DIAGNOSIS — Z7951 Long term (current) use of inhaled steroids: Secondary | ICD-10-CM | POA: Diagnosis not present

## 2023-07-29 DIAGNOSIS — R1312 Dysphagia, oropharyngeal phase: Secondary | ICD-10-CM | POA: Diagnosis not present

## 2023-07-29 DIAGNOSIS — K5900 Constipation, unspecified: Secondary | ICD-10-CM | POA: Diagnosis not present

## 2023-07-29 DIAGNOSIS — Q2549 Other congenital malformations of aorta: Secondary | ICD-10-CM | POA: Diagnosis not present

## 2023-07-29 DIAGNOSIS — K219 Gastro-esophageal reflux disease without esophagitis: Secondary | ICD-10-CM | POA: Diagnosis not present

## 2023-07-29 DIAGNOSIS — Z955 Presence of coronary angioplasty implant and graft: Secondary | ICD-10-CM | POA: Diagnosis not present

## 2023-07-29 DIAGNOSIS — I2510 Atherosclerotic heart disease of native coronary artery without angina pectoris: Secondary | ICD-10-CM | POA: Diagnosis not present

## 2023-07-29 DIAGNOSIS — I129 Hypertensive chronic kidney disease with stage 1 through stage 4 chronic kidney disease, or unspecified chronic kidney disease: Secondary | ICD-10-CM | POA: Diagnosis not present

## 2023-07-29 DIAGNOSIS — Z79899 Other long term (current) drug therapy: Secondary | ICD-10-CM | POA: Diagnosis not present

## 2023-07-29 DIAGNOSIS — Z7982 Long term (current) use of aspirin: Secondary | ICD-10-CM | POA: Diagnosis not present

## 2023-07-29 DIAGNOSIS — D631 Anemia in chronic kidney disease: Secondary | ICD-10-CM | POA: Diagnosis not present

## 2023-07-29 DIAGNOSIS — E43 Unspecified severe protein-calorie malnutrition: Secondary | ICD-10-CM | POA: Diagnosis not present

## 2023-07-29 DIAGNOSIS — G250 Essential tremor: Secondary | ICD-10-CM | POA: Diagnosis not present

## 2023-07-29 DIAGNOSIS — Z8701 Personal history of pneumonia (recurrent): Secondary | ICD-10-CM | POA: Diagnosis not present

## 2023-07-29 DIAGNOSIS — H903 Sensorineural hearing loss, bilateral: Secondary | ICD-10-CM | POA: Diagnosis not present

## 2023-07-29 DIAGNOSIS — Z431 Encounter for attention to gastrostomy: Secondary | ICD-10-CM | POA: Diagnosis not present

## 2023-07-30 DIAGNOSIS — R1312 Dysphagia, oropharyngeal phase: Secondary | ICD-10-CM | POA: Diagnosis not present

## 2023-07-30 DIAGNOSIS — Z7951 Long term (current) use of inhaled steroids: Secondary | ICD-10-CM | POA: Diagnosis not present

## 2023-07-30 DIAGNOSIS — D631 Anemia in chronic kidney disease: Secondary | ICD-10-CM | POA: Diagnosis not present

## 2023-07-30 DIAGNOSIS — I129 Hypertensive chronic kidney disease with stage 1 through stage 4 chronic kidney disease, or unspecified chronic kidney disease: Secondary | ICD-10-CM | POA: Diagnosis not present

## 2023-07-30 DIAGNOSIS — E785 Hyperlipidemia, unspecified: Secondary | ICD-10-CM | POA: Diagnosis not present

## 2023-07-30 DIAGNOSIS — J4489 Other specified chronic obstructive pulmonary disease: Secondary | ICD-10-CM | POA: Diagnosis not present

## 2023-07-30 DIAGNOSIS — Z79899 Other long term (current) drug therapy: Secondary | ICD-10-CM | POA: Diagnosis not present

## 2023-07-30 DIAGNOSIS — Q2549 Other congenital malformations of aorta: Secondary | ICD-10-CM | POA: Diagnosis not present

## 2023-07-30 DIAGNOSIS — M109 Gout, unspecified: Secondary | ICD-10-CM | POA: Diagnosis not present

## 2023-07-30 DIAGNOSIS — N189 Chronic kidney disease, unspecified: Secondary | ICD-10-CM | POA: Diagnosis not present

## 2023-07-30 DIAGNOSIS — I2510 Atherosclerotic heart disease of native coronary artery without angina pectoris: Secondary | ICD-10-CM | POA: Diagnosis not present

## 2023-07-30 DIAGNOSIS — Z8701 Personal history of pneumonia (recurrent): Secondary | ICD-10-CM | POA: Diagnosis not present

## 2023-07-30 DIAGNOSIS — M8580 Other specified disorders of bone density and structure, unspecified site: Secondary | ICD-10-CM | POA: Diagnosis not present

## 2023-07-30 DIAGNOSIS — R911 Solitary pulmonary nodule: Secondary | ICD-10-CM | POA: Diagnosis not present

## 2023-07-30 DIAGNOSIS — Z7982 Long term (current) use of aspirin: Secondary | ICD-10-CM | POA: Diagnosis not present

## 2023-07-30 DIAGNOSIS — E43 Unspecified severe protein-calorie malnutrition: Secondary | ICD-10-CM | POA: Diagnosis not present

## 2023-07-30 DIAGNOSIS — K5900 Constipation, unspecified: Secondary | ICD-10-CM | POA: Diagnosis not present

## 2023-07-30 DIAGNOSIS — K219 Gastro-esophageal reflux disease without esophagitis: Secondary | ICD-10-CM | POA: Diagnosis not present

## 2023-07-30 DIAGNOSIS — G250 Essential tremor: Secondary | ICD-10-CM | POA: Diagnosis not present

## 2023-07-30 DIAGNOSIS — Z431 Encounter for attention to gastrostomy: Secondary | ICD-10-CM | POA: Diagnosis not present

## 2023-07-30 DIAGNOSIS — H903 Sensorineural hearing loss, bilateral: Secondary | ICD-10-CM | POA: Diagnosis not present

## 2023-07-30 DIAGNOSIS — Z955 Presence of coronary angioplasty implant and graft: Secondary | ICD-10-CM | POA: Diagnosis not present

## 2023-08-01 ENCOUNTER — Ambulatory Visit (HOSPITAL_COMMUNITY)
Admission: RE | Admit: 2023-08-01 | Discharge: 2023-08-01 | Disposition: A | Discharge status: Home / Self Care | Payer: Medicare HMO | Source: Ambulatory Visit | Attending: Urology | Admitting: Urology

## 2023-08-01 DIAGNOSIS — C61 Malignant neoplasm of prostate: Secondary | ICD-10-CM | POA: Diagnosis not present

## 2023-08-01 MED ORDER — FLOTUFOLASTAT F 18 GALLIUM 296-5846 MBQ/ML IV SOLN
7.2240 | Freq: Once | INTRAVENOUS | Status: AC
  Administered 2023-08-01: 7.224 via INTRAVENOUS

## 2023-08-04 ENCOUNTER — Encounter (HOSPITAL_COMMUNITY): Payer: Self-pay | Admitting: Vascular Surgery

## 2023-08-04 ENCOUNTER — Encounter: Payer: Self-pay | Admitting: Oncology

## 2023-08-04 DIAGNOSIS — N189 Chronic kidney disease, unspecified: Secondary | ICD-10-CM | POA: Diagnosis not present

## 2023-08-04 DIAGNOSIS — J4489 Other specified chronic obstructive pulmonary disease: Secondary | ICD-10-CM | POA: Diagnosis not present

## 2023-08-04 DIAGNOSIS — R1312 Dysphagia, oropharyngeal phase: Secondary | ICD-10-CM | POA: Diagnosis not present

## 2023-08-04 DIAGNOSIS — M109 Gout, unspecified: Secondary | ICD-10-CM | POA: Diagnosis not present

## 2023-08-04 DIAGNOSIS — H903 Sensorineural hearing loss, bilateral: Secondary | ICD-10-CM | POA: Diagnosis not present

## 2023-08-04 DIAGNOSIS — Z7982 Long term (current) use of aspirin: Secondary | ICD-10-CM | POA: Diagnosis not present

## 2023-08-04 DIAGNOSIS — D631 Anemia in chronic kidney disease: Secondary | ICD-10-CM | POA: Diagnosis not present

## 2023-08-04 DIAGNOSIS — Z7951 Long term (current) use of inhaled steroids: Secondary | ICD-10-CM | POA: Diagnosis not present

## 2023-08-04 DIAGNOSIS — G250 Essential tremor: Secondary | ICD-10-CM | POA: Diagnosis not present

## 2023-08-04 DIAGNOSIS — I129 Hypertensive chronic kidney disease with stage 1 through stage 4 chronic kidney disease, or unspecified chronic kidney disease: Secondary | ICD-10-CM | POA: Diagnosis not present

## 2023-08-04 DIAGNOSIS — E785 Hyperlipidemia, unspecified: Secondary | ICD-10-CM | POA: Diagnosis not present

## 2023-08-04 DIAGNOSIS — Z79899 Other long term (current) drug therapy: Secondary | ICD-10-CM | POA: Diagnosis not present

## 2023-08-04 DIAGNOSIS — Z8701 Personal history of pneumonia (recurrent): Secondary | ICD-10-CM | POA: Diagnosis not present

## 2023-08-04 DIAGNOSIS — I2510 Atherosclerotic heart disease of native coronary artery without angina pectoris: Secondary | ICD-10-CM | POA: Diagnosis not present

## 2023-08-04 DIAGNOSIS — Z431 Encounter for attention to gastrostomy: Secondary | ICD-10-CM | POA: Diagnosis not present

## 2023-08-04 DIAGNOSIS — K5900 Constipation, unspecified: Secondary | ICD-10-CM | POA: Diagnosis not present

## 2023-08-04 DIAGNOSIS — K219 Gastro-esophageal reflux disease without esophagitis: Secondary | ICD-10-CM | POA: Diagnosis not present

## 2023-08-04 DIAGNOSIS — Q2549 Other congenital malformations of aorta: Secondary | ICD-10-CM | POA: Diagnosis not present

## 2023-08-04 DIAGNOSIS — M8580 Other specified disorders of bone density and structure, unspecified site: Secondary | ICD-10-CM | POA: Diagnosis not present

## 2023-08-04 DIAGNOSIS — E43 Unspecified severe protein-calorie malnutrition: Secondary | ICD-10-CM | POA: Diagnosis not present

## 2023-08-04 DIAGNOSIS — R911 Solitary pulmonary nodule: Secondary | ICD-10-CM | POA: Diagnosis not present

## 2023-08-04 DIAGNOSIS — Z955 Presence of coronary angioplasty implant and graft: Secondary | ICD-10-CM | POA: Diagnosis not present

## 2023-08-05 ENCOUNTER — Encounter (HOSPITAL_COMMUNITY): Payer: Self-pay | Admitting: Vascular Surgery

## 2023-08-05 DIAGNOSIS — Z7982 Long term (current) use of aspirin: Secondary | ICD-10-CM | POA: Diagnosis not present

## 2023-08-05 DIAGNOSIS — Z7951 Long term (current) use of inhaled steroids: Secondary | ICD-10-CM | POA: Diagnosis not present

## 2023-08-05 DIAGNOSIS — G250 Essential tremor: Secondary | ICD-10-CM | POA: Diagnosis not present

## 2023-08-05 DIAGNOSIS — K219 Gastro-esophageal reflux disease without esophagitis: Secondary | ICD-10-CM | POA: Diagnosis not present

## 2023-08-05 DIAGNOSIS — K5900 Constipation, unspecified: Secondary | ICD-10-CM | POA: Diagnosis not present

## 2023-08-05 DIAGNOSIS — H903 Sensorineural hearing loss, bilateral: Secondary | ICD-10-CM | POA: Diagnosis not present

## 2023-08-05 DIAGNOSIS — R1312 Dysphagia, oropharyngeal phase: Secondary | ICD-10-CM | POA: Diagnosis not present

## 2023-08-05 DIAGNOSIS — D631 Anemia in chronic kidney disease: Secondary | ICD-10-CM | POA: Diagnosis not present

## 2023-08-05 DIAGNOSIS — Z8701 Personal history of pneumonia (recurrent): Secondary | ICD-10-CM | POA: Diagnosis not present

## 2023-08-05 DIAGNOSIS — M8580 Other specified disorders of bone density and structure, unspecified site: Secondary | ICD-10-CM | POA: Diagnosis not present

## 2023-08-05 DIAGNOSIS — R911 Solitary pulmonary nodule: Secondary | ICD-10-CM | POA: Diagnosis not present

## 2023-08-05 DIAGNOSIS — E785 Hyperlipidemia, unspecified: Secondary | ICD-10-CM | POA: Diagnosis not present

## 2023-08-05 DIAGNOSIS — Z955 Presence of coronary angioplasty implant and graft: Secondary | ICD-10-CM | POA: Diagnosis not present

## 2023-08-05 DIAGNOSIS — Z79899 Other long term (current) drug therapy: Secondary | ICD-10-CM | POA: Diagnosis not present

## 2023-08-05 DIAGNOSIS — I129 Hypertensive chronic kidney disease with stage 1 through stage 4 chronic kidney disease, or unspecified chronic kidney disease: Secondary | ICD-10-CM | POA: Diagnosis not present

## 2023-08-05 DIAGNOSIS — E43 Unspecified severe protein-calorie malnutrition: Secondary | ICD-10-CM | POA: Diagnosis not present

## 2023-08-05 DIAGNOSIS — I2510 Atherosclerotic heart disease of native coronary artery without angina pectoris: Secondary | ICD-10-CM | POA: Diagnosis not present

## 2023-08-05 DIAGNOSIS — Q2549 Other congenital malformations of aorta: Secondary | ICD-10-CM | POA: Diagnosis not present

## 2023-08-05 DIAGNOSIS — M109 Gout, unspecified: Secondary | ICD-10-CM | POA: Diagnosis not present

## 2023-08-05 DIAGNOSIS — J4489 Other specified chronic obstructive pulmonary disease: Secondary | ICD-10-CM | POA: Diagnosis not present

## 2023-08-05 DIAGNOSIS — Z431 Encounter for attention to gastrostomy: Secondary | ICD-10-CM | POA: Diagnosis not present

## 2023-08-05 DIAGNOSIS — N189 Chronic kidney disease, unspecified: Secondary | ICD-10-CM | POA: Diagnosis not present

## 2023-08-06 ENCOUNTER — Encounter: Payer: Self-pay | Admitting: Oncology

## 2023-08-06 NOTE — Progress Notes (Signed)
Retacrit changed to darbepoetin due to insurance preference

## 2023-08-07 ENCOUNTER — Telehealth: Payer: Self-pay | Admitting: Oncology

## 2023-08-07 NOTE — Telephone Encounter (Signed)
Contacted pt to schedule an appt. Per pt he is unable to talk at the moment, I told him I would call at a later time..  Scheduling Message Entered by Domenic Schwab on 08/06/2023 at  4:33 PM Priority: Routine <No visit type provided>  Department: [facility]  Provider:  Appointment Notes:  Please schedule pt for aranesp injections once every 28 days.  He will need labs prior to each injection.  9/19 labs ok for 1st one.

## 2023-08-08 DIAGNOSIS — H903 Sensorineural hearing loss, bilateral: Secondary | ICD-10-CM | POA: Diagnosis not present

## 2023-08-08 DIAGNOSIS — E785 Hyperlipidemia, unspecified: Secondary | ICD-10-CM | POA: Diagnosis not present

## 2023-08-08 DIAGNOSIS — M109 Gout, unspecified: Secondary | ICD-10-CM | POA: Diagnosis not present

## 2023-08-08 DIAGNOSIS — N189 Chronic kidney disease, unspecified: Secondary | ICD-10-CM | POA: Diagnosis not present

## 2023-08-08 DIAGNOSIS — I2510 Atherosclerotic heart disease of native coronary artery without angina pectoris: Secondary | ICD-10-CM | POA: Diagnosis not present

## 2023-08-08 DIAGNOSIS — Z955 Presence of coronary angioplasty implant and graft: Secondary | ICD-10-CM | POA: Diagnosis not present

## 2023-08-08 DIAGNOSIS — Z7982 Long term (current) use of aspirin: Secondary | ICD-10-CM | POA: Diagnosis not present

## 2023-08-08 DIAGNOSIS — K219 Gastro-esophageal reflux disease without esophagitis: Secondary | ICD-10-CM | POA: Diagnosis not present

## 2023-08-08 DIAGNOSIS — Z79899 Other long term (current) drug therapy: Secondary | ICD-10-CM | POA: Diagnosis not present

## 2023-08-08 DIAGNOSIS — J4489 Other specified chronic obstructive pulmonary disease: Secondary | ICD-10-CM | POA: Diagnosis not present

## 2023-08-08 DIAGNOSIS — R1312 Dysphagia, oropharyngeal phase: Secondary | ICD-10-CM | POA: Diagnosis not present

## 2023-08-08 DIAGNOSIS — Q2549 Other congenital malformations of aorta: Secondary | ICD-10-CM | POA: Diagnosis not present

## 2023-08-08 DIAGNOSIS — Z7951 Long term (current) use of inhaled steroids: Secondary | ICD-10-CM | POA: Diagnosis not present

## 2023-08-08 DIAGNOSIS — K5900 Constipation, unspecified: Secondary | ICD-10-CM | POA: Diagnosis not present

## 2023-08-08 DIAGNOSIS — Z431 Encounter for attention to gastrostomy: Secondary | ICD-10-CM | POA: Diagnosis not present

## 2023-08-08 DIAGNOSIS — R911 Solitary pulmonary nodule: Secondary | ICD-10-CM | POA: Diagnosis not present

## 2023-08-08 DIAGNOSIS — Z8701 Personal history of pneumonia (recurrent): Secondary | ICD-10-CM | POA: Diagnosis not present

## 2023-08-08 DIAGNOSIS — D631 Anemia in chronic kidney disease: Secondary | ICD-10-CM | POA: Diagnosis not present

## 2023-08-08 DIAGNOSIS — I129 Hypertensive chronic kidney disease with stage 1 through stage 4 chronic kidney disease, or unspecified chronic kidney disease: Secondary | ICD-10-CM | POA: Diagnosis not present

## 2023-08-08 DIAGNOSIS — M8580 Other specified disorders of bone density and structure, unspecified site: Secondary | ICD-10-CM | POA: Diagnosis not present

## 2023-08-08 DIAGNOSIS — E43 Unspecified severe protein-calorie malnutrition: Secondary | ICD-10-CM | POA: Diagnosis not present

## 2023-08-08 DIAGNOSIS — G250 Essential tremor: Secondary | ICD-10-CM | POA: Diagnosis not present

## 2023-08-13 DIAGNOSIS — K5900 Constipation, unspecified: Secondary | ICD-10-CM | POA: Diagnosis not present

## 2023-08-13 DIAGNOSIS — Z7982 Long term (current) use of aspirin: Secondary | ICD-10-CM | POA: Diagnosis not present

## 2023-08-13 DIAGNOSIS — E43 Unspecified severe protein-calorie malnutrition: Secondary | ICD-10-CM | POA: Diagnosis not present

## 2023-08-13 DIAGNOSIS — H903 Sensorineural hearing loss, bilateral: Secondary | ICD-10-CM | POA: Diagnosis not present

## 2023-08-13 DIAGNOSIS — Z8701 Personal history of pneumonia (recurrent): Secondary | ICD-10-CM | POA: Diagnosis not present

## 2023-08-13 DIAGNOSIS — K219 Gastro-esophageal reflux disease without esophagitis: Secondary | ICD-10-CM | POA: Diagnosis not present

## 2023-08-13 DIAGNOSIS — I2510 Atherosclerotic heart disease of native coronary artery without angina pectoris: Secondary | ICD-10-CM | POA: Diagnosis not present

## 2023-08-13 DIAGNOSIS — M109 Gout, unspecified: Secondary | ICD-10-CM | POA: Diagnosis not present

## 2023-08-13 DIAGNOSIS — I129 Hypertensive chronic kidney disease with stage 1 through stage 4 chronic kidney disease, or unspecified chronic kidney disease: Secondary | ICD-10-CM | POA: Diagnosis not present

## 2023-08-13 DIAGNOSIS — Z431 Encounter for attention to gastrostomy: Secondary | ICD-10-CM | POA: Diagnosis not present

## 2023-08-13 DIAGNOSIS — Q2549 Other congenital malformations of aorta: Secondary | ICD-10-CM | POA: Diagnosis not present

## 2023-08-13 DIAGNOSIS — E785 Hyperlipidemia, unspecified: Secondary | ICD-10-CM | POA: Diagnosis not present

## 2023-08-13 DIAGNOSIS — Z79899 Other long term (current) drug therapy: Secondary | ICD-10-CM | POA: Diagnosis not present

## 2023-08-13 DIAGNOSIS — R911 Solitary pulmonary nodule: Secondary | ICD-10-CM | POA: Diagnosis not present

## 2023-08-13 DIAGNOSIS — M8580 Other specified disorders of bone density and structure, unspecified site: Secondary | ICD-10-CM | POA: Diagnosis not present

## 2023-08-13 DIAGNOSIS — G250 Essential tremor: Secondary | ICD-10-CM | POA: Diagnosis not present

## 2023-08-13 DIAGNOSIS — Z7951 Long term (current) use of inhaled steroids: Secondary | ICD-10-CM | POA: Diagnosis not present

## 2023-08-13 DIAGNOSIS — J4489 Other specified chronic obstructive pulmonary disease: Secondary | ICD-10-CM | POA: Diagnosis not present

## 2023-08-13 DIAGNOSIS — Z955 Presence of coronary angioplasty implant and graft: Secondary | ICD-10-CM | POA: Diagnosis not present

## 2023-08-13 DIAGNOSIS — N189 Chronic kidney disease, unspecified: Secondary | ICD-10-CM | POA: Diagnosis not present

## 2023-08-13 DIAGNOSIS — R1312 Dysphagia, oropharyngeal phase: Secondary | ICD-10-CM | POA: Diagnosis not present

## 2023-08-13 DIAGNOSIS — D631 Anemia in chronic kidney disease: Secondary | ICD-10-CM | POA: Diagnosis not present

## 2023-08-15 DIAGNOSIS — K5900 Constipation, unspecified: Secondary | ICD-10-CM | POA: Diagnosis not present

## 2023-08-15 DIAGNOSIS — K219 Gastro-esophageal reflux disease without esophagitis: Secondary | ICD-10-CM | POA: Diagnosis not present

## 2023-08-15 DIAGNOSIS — R911 Solitary pulmonary nodule: Secondary | ICD-10-CM | POA: Diagnosis not present

## 2023-08-15 DIAGNOSIS — Z7951 Long term (current) use of inhaled steroids: Secondary | ICD-10-CM | POA: Diagnosis not present

## 2023-08-15 DIAGNOSIS — Z7982 Long term (current) use of aspirin: Secondary | ICD-10-CM | POA: Diagnosis not present

## 2023-08-15 DIAGNOSIS — N189 Chronic kidney disease, unspecified: Secondary | ICD-10-CM | POA: Diagnosis not present

## 2023-08-15 DIAGNOSIS — M8580 Other specified disorders of bone density and structure, unspecified site: Secondary | ICD-10-CM | POA: Diagnosis not present

## 2023-08-15 DIAGNOSIS — Z79899 Other long term (current) drug therapy: Secondary | ICD-10-CM | POA: Diagnosis not present

## 2023-08-15 DIAGNOSIS — E785 Hyperlipidemia, unspecified: Secondary | ICD-10-CM | POA: Diagnosis not present

## 2023-08-15 DIAGNOSIS — H903 Sensorineural hearing loss, bilateral: Secondary | ICD-10-CM | POA: Diagnosis not present

## 2023-08-15 DIAGNOSIS — R1312 Dysphagia, oropharyngeal phase: Secondary | ICD-10-CM | POA: Diagnosis not present

## 2023-08-15 DIAGNOSIS — J4489 Other specified chronic obstructive pulmonary disease: Secondary | ICD-10-CM | POA: Diagnosis not present

## 2023-08-15 DIAGNOSIS — E43 Unspecified severe protein-calorie malnutrition: Secondary | ICD-10-CM | POA: Diagnosis not present

## 2023-08-15 DIAGNOSIS — Z955 Presence of coronary angioplasty implant and graft: Secondary | ICD-10-CM | POA: Diagnosis not present

## 2023-08-15 DIAGNOSIS — G250 Essential tremor: Secondary | ICD-10-CM | POA: Diagnosis not present

## 2023-08-15 DIAGNOSIS — D631 Anemia in chronic kidney disease: Secondary | ICD-10-CM | POA: Diagnosis not present

## 2023-08-15 DIAGNOSIS — M109 Gout, unspecified: Secondary | ICD-10-CM | POA: Diagnosis not present

## 2023-08-15 DIAGNOSIS — Q2549 Other congenital malformations of aorta: Secondary | ICD-10-CM | POA: Diagnosis not present

## 2023-08-15 DIAGNOSIS — I129 Hypertensive chronic kidney disease with stage 1 through stage 4 chronic kidney disease, or unspecified chronic kidney disease: Secondary | ICD-10-CM | POA: Diagnosis not present

## 2023-08-15 DIAGNOSIS — Z8701 Personal history of pneumonia (recurrent): Secondary | ICD-10-CM | POA: Diagnosis not present

## 2023-08-15 DIAGNOSIS — Z431 Encounter for attention to gastrostomy: Secondary | ICD-10-CM | POA: Diagnosis not present

## 2023-08-15 DIAGNOSIS — I2510 Atherosclerotic heart disease of native coronary artery without angina pectoris: Secondary | ICD-10-CM | POA: Diagnosis not present

## 2023-08-19 ENCOUNTER — Encounter: Payer: Self-pay | Admitting: Oncology

## 2023-08-19 ENCOUNTER — Encounter: Payer: Self-pay | Admitting: Internal Medicine

## 2023-08-19 ENCOUNTER — Ambulatory Visit (INDEPENDENT_AMBULATORY_CARE_PROVIDER_SITE_OTHER): Payer: Medicare HMO | Admitting: Internal Medicine

## 2023-08-19 VITALS — BP 118/68 | HR 44 | Ht 70.0 in | Wt 129.6 lb

## 2023-08-19 DIAGNOSIS — R634 Abnormal weight loss: Secondary | ICD-10-CM

## 2023-08-19 DIAGNOSIS — K9420 Gastrostomy complication, unspecified: Secondary | ICD-10-CM

## 2023-08-19 DIAGNOSIS — T85598S Other mechanical complication of other gastrointestinal prosthetic devices, implants and grafts, sequela: Secondary | ICD-10-CM

## 2023-08-19 DIAGNOSIS — R1314 Dysphagia, pharyngoesophageal phase: Secondary | ICD-10-CM | POA: Diagnosis not present

## 2023-08-19 NOTE — Progress Notes (Signed)
HISTORY OF PRESENT ILLNESS:  Brian Dunn is a 82 y.o. male, retired Medical illustrator, with MULTIPLE significant medical problems who sent today regarding chronic dysphagia, the need for feeding tube, and issues with weight gain.  Previously seen by a gastroenterologist in [place].  He is accompanied today by his wife.  The patient and his wife report that he is lost weight over the past 3 to 4 years.  His maximal weight was 175 pounds.  Current weight 130 pounds.  It appears that he has had issues with chronic dysphagia and recurrent aspiration pneumonia.  He has undergone multiple endoscopies with dilation with his primary gastroenterologist.  These proved to be of little benefit.  He was eventually diagnosed with a Kommerell diverticulum which was felt to be the source of his dysphagia.  He underwent surgery in the form of thoracic aortic endovascular stent graft and stent of the left CIA and EIA Mar 11, 2023.  Prolonged hospitalization.  Recurrent hospitalization.  Failed swallowing studies with aspiration.  Relegated to chronic feeding tube which had to be placed surgically due to his colon being interposed between the stomach and abdominal wall.  He was on Jevity tube feeds which she apparently did not tolerate due to combination of abdominal cramping and alternating bowel habits.  He was subsequently changed to Osmolite, which has been somewhat but less problematic.  He saw an ENT who did vocal cord injection of Botox.  It was recommended that the patient gained weight.  They were told that a manage feeding tubes and feeding problems (which I do not) and thus this appointment.  Laboratories from July 24, 2023 albumin 3.4 hemoglobin 9.3.  Ferritin 201.  REVIEW OF SYSTEMS:  All non-GI ROS negative unless otherwise stated in the HPI except for hearing problems, voice change  Past Medical History:  Diagnosis Date   Acute bronchitis 05/29/2012   Allergic rhinitis    Anemia in chronic kidney  disease 02/27/2021   Aneurysm of right subclavian artery (HCC) 02/07/2023   Angina pectoris (HCC) 11/07/2020   Angina, class III (HCC) 11/07/2020   Asthma 06/18/2012   Followed in Pulmonary clinic/ [facility]/ [facility]  - 01/09/2018  After extensive coaching inhaler device  effectiveness =   75% > continue symb 160 2bid and return for pfts in 3 months ? RA bronchiolitis?  - PFT's  04/23/2018  FEV1 2.84 (91 % ) ratio 73  p no % improvement from saba p symb 160 prior to study with DLCO  76 % corrects to 79  % for alv volume   - 04/23/2018  After extensive coaching   Benign essential hypertension 06/20/2016   Bilateral inguinal hernia without obstruction or gangrene 06/20/2016   CAD (coronary artery disease), native coronary artery 06/20/2016   Chest pain, unspecified    Chronic ischemic heart disease, unspecified 01/28/2022   Chronic kidney disease 01/28/2022   COPD (chronic obstructive pulmonary disease) (HCC)    Coronary artery disease involving native coronary artery of native heart with angina pectoris (HCC) 06/20/2016   Cough 03/25/2012   Followed in Pulmonary clinic/ [facility]/ [facility]    - Trial off acei again  03/25/2012    - Sinus CT 01/24/11 There is mucosal thickening within the paranasal sinuses without  air-fluid levels. Neither ostiomeatal unit is patent.    Cough variant asthma 10/18/2010   [facility]  -HFA 75% p coaching 01/22/2011  -PFT's  02/04/2011 minimal airflow obst, nl dlco  Disorder of bone and cartilage 01/22/2007   Qualifier: Diagnosis of  By: Drue Novel MD, Nolon Rod.   Overview:  Overview:  Qualifier: Diagnosis of  By: Drue Novel MD, Jose E.   Dysphagia 12/27/2022   Onset ? Aug 2022 p febrile illness ? Asp pna   -  MBS 01/13/23   PO Diet Recommendation: Regular; Thin liquids (Level 0)  Liquid Administration via: Cup; Spoon; Straw  Supervision: Patient able to self-feed  Postural changes: -- (try reclining when sipping liquids)  Oral care  recommendations: Oral care QID (4x/day); Oral care before ice   chips/water  Recommended consults: Consider ENT consultation   Encounter for fitting and adjustment of hearing aid 01/29/2022   Essential hypertension 11/06/2017   GERD 01/22/2007   Annotation: history of esophageal stricture Qualifier: Diagnosis of  By: Drue Novel MD, Nolon Rod.    GERD (gastroesophageal reflux disease)    Gout 01/28/2022   HIP FRACTURE 01/22/2007   Annotation: right-sided status post surgery Qualifier: Diagnosis of  By: Drue Novel MD, Nolon Rod.    Hip fracture (HCC)    Hyperlipidemia 06/20/2016   Hyperlipidemia with target LDL less than 70 06/20/2016   Hypertension    HYPOGONADISM 02/18/2007   Qualifier: Diagnosis of  By: Drue Novel MD, Nolon Rod.    Hypogonadism male    Low back pain 09/09/2013   Multiple lung nodules on CT 11/21/2015   CT [place] 10/17/15 mpns > 15 y since quit smoking > rec 12 m f/u as this is low risk > done 06/04/16 no change rec recheck in 64m  - CT [place] 12/08/17 no change nodules > meets benign criteria > no directed f/u - Quantiferon GOLD TB 01/09/18 neg    Osteopenia    Personal history of malignant neoplasm of prostate    Postoperative visit 08/14/2016   Sensorineural hearing loss, bilateral 01/28/2022   Tremor, essential 02/18/2007   Overview:  Overview:  Qualifier: Diagnosis of  By: Drue Novel MD, Nolon Rod.  Last Assessment & Plan:  Reviewed tremor may increase on dulera so will need to be balanced against benefits to cough    Past Surgical History:  Procedure Laterality Date   CAROTID-SUBCLAVIAN BYPASS GRAFT Right 03/11/2023   Procedure: RIGHT SUBCLAVIAN-CAROTID TRANSPOSITION;  Surgeon: Maeola Harman, MD;  Location[facility];  Service: Vascular;  Laterality: Right;   CORONARY ANGIOPLASTY WITH STENT PLACEMENT  11/04/2006   CORONARY STENT INTERVENTION N/A 11/16/2020   Procedure: CORONARY STENT INTERVENTION;  Surgeon: Marykay Lex, MD;  Location: [facility] INVASIVE CV LAB;  Service: Cardiovascular;   Laterality: N/A;   EYE SURGERY Bilateral 11/2022   cataract removal   INSERTION OF ILIAC STENT Left 03/11/2023   Procedure: INSERTION OF LEFT ILIAC STENT USING VIABAHN VBX X 54. STENT;  Surgeon: Maeola Harman, MD;  Location[facility] OR;  Service: Vascular;  Laterality: Left;   INSERTION PROSTATE RADIATION SEED     LEFT HEART CATH AND CORONARY ANGIOGRAPHY N/A 11/16/2020   Procedure: LEFT HEART CATH AND CORONARY ANGIOGRAPHY;  Surgeon: Marykay Lex, MD;  Location: [facility] INVASIVE CV LAB;  Service: Cardiovascular;  Laterality: N/A;   PEG PLACEMENT N/A 03/19/2023   Procedure: PERCUTANEOUS ENDOSCOPIC GASTROSTOMY (PEG) PLACEMENT;  Surgeon: Diamantina Monks, MD;  Location: [facility] OR;  Service: General;  Laterality: N/A;   THORACIC AORTIC ENDOVASCULAR STENT GRAFT Bilateral 03/11/2023   Procedure: THORACIC AORTIC ENDOVASCULAR STENT GRAFT USING ZENITH ALPHA X STENT (COOK);  Surgeon: Maeola Harman, MD;  Location: [facility] OR;  Service:  Vascular;  Laterality: Bilateral;   ULTRASOUND GUIDANCE FOR VASCULAR ACCESS Bilateral 03/11/2023   Procedure: ULTRASOUND GUIDANCE FOR VASCULAR ACCESS;  Surgeon: Maeola Harman, MD;  Location: [facility] OR;  Service: Vascular;  Laterality: Bilateral;    Social History Inis Sizer  reports that he quit smoking about 26 years ago. His smoking use included cigarettes. He started smoking about 61 years ago. He has a 35 pack-year smoking history. He has never used smokeless tobacco. He reports that he does not currently use alcohol. He reports that he does not use drugs.  family history includes Asthma in his father and sister; Diabetes in his sister; Heart disease in his mother and sister; Liver cancer in his sister and sister; Liver disease in his mother; Parkinsonism in his father; Throat cancer in his sister.  Allergies  Allergen Reactions   Metoprolol Other (See Comments)    Weight gain   Penicillins Swelling   Morphine Rash        PHYSICAL EXAMINATION: Vital signs: BP 118/68   Pulse (!) 44   Ht 5\' 10"  (1.778 m)   Wt 129 lb 9.6 oz (58.8 kg)   BMI 18.60 kg/m   Constitutional: Chronically ill-appearing thin elderly gentleman, no acute distress.  Hearing aids Psychiatric: alert and oriented x3, cooperative Eyes: extraocular movements intact, anicteric, conjunctiva pink Mouth: oral pharynx moist, no lesions Neck: supple no lymphadenopathy Cardiovascular: heart regular rate and rhythm, no murmur Lungs: clear to auscultation bilaterally Abdomen: soft, thin, nontender, nondistended, no obvious ascites, no peritoneal signs, normal bowel sounds, no organomegaly.  Feeding tube upper mid abdomen.  Somewhat loose.  I tightened it a bit.  Surrounding area looks okay. Rectal: Omitted Extremities: no clubbing, cyanosis, or lower extremity edema bilaterally Skin: no lesions on visible extremities Neuro: No focal deficits.  Cranial nerves intact  ASSESSMENT:  1.  Oropharyngeal dysphagia with aspiration. 2.  History of Kommerell diverticulum status post surgery 3.  Chronic feeding tube 4.  Weight loss 5.  Multiple significant medical problems and advanced age   PLAN:  1.  I was honest with the patient and his wife that I do not manage feeding tubes or associated nutritional issues. 2.  I did discuss with them potential feeding tube complication such as migration ulceration.  They will continue to follow-up with her general surgeon who placed the feeding tube in this regard. 3.  Will make referral to nutritionist.  The patient and his wife are interested in high-calorie feedings.  We also discussed the importance of keeping the gastrostomy tube from clogging. They will return to the care of their primary provider and other specialists Total time of 60 minutes was spent preparing to see the patient (the majority of the time) by reviewing emerita records and data as well as x-rays and other studies, obtaining comprehensive  history, performing medically appropriate physical examination, counseling and educating the patient and his wife regarding the above listed issues, making and nutritional consultation referral, and documenting clinical information in the health record

## 2023-08-19 NOTE — Patient Instructions (Signed)
We have placed referral for Nutritionist. They will contact you to schedule. If you have not heard from them within  1 week, please contact them at number listed on AVS.   _______________________________________________________  If your blood pressure at your visit was 140/90 or greater, please contact your primary care physician to follow up on this.  _______________________________________________________  If you are age 83 or older, your body mass index should be between 23-30. Your Body mass index is 18.6 kg/m. If this is out of the aforementioned range listed, please consider follow up with your Primary Care Provider.  If you are age 94 or younger, your body mass index should be between 19-25. Your Body mass index is 18.6 kg/m. If this is out of the aformentioned range listed, please consider follow up with your Primary Care Provider.   ________________________________________________________  The [facility] providers would like to encourage you to use [facility] to communicate with providers for non-urgent requests or questions.  Due to long hold times on the telephone, sending your provider a message by [facility] may be a faster and more efficient way to get a response.  Please allow 48 business hours for a response.  Please remember that this is for non-urgent requests.  _______________________________________________________  Thank you for choosing me and [facility].  Dr. Yancey Flemings

## 2023-08-20 ENCOUNTER — Ambulatory Visit: Payer: Medicare HMO | Admitting: Vascular Surgery

## 2023-08-20 ENCOUNTER — Encounter: Payer: Self-pay | Admitting: Vascular Surgery

## 2023-08-20 ENCOUNTER — Telehealth: Payer: Self-pay | Admitting: Oncology

## 2023-08-20 VITALS — BP 125/79 | HR 76 | Temp 98.0°F | Ht 70.0 in | Wt 129.3 lb

## 2023-08-20 DIAGNOSIS — Q2549 Other congenital malformations of aorta: Secondary | ICD-10-CM

## 2023-08-20 NOTE — Progress Notes (Signed)
Patient ID: Stein Burgos [facility], male   DOB: 1941/10/28, 82 y.o.   MRN: [id number]  Reason for Consult: Carotid   Referred by Physicians, Cheryln Manly F*  Subjective:     HPI:  Benicio Larranaga is a 82 y.o. 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  He had CT scan 2 months ago was scheduled to follow-up but unfortunately had COVID-pneumonia complicated by aspiration bacterial pneumonia.  Patient has had continued difficulty from nerve injury presumably from surgery.  He has had injection of his vocal cords now is able to speak but still has been n.p.o. since May currently feeding through a feeding tube.  Overall he is progressing as well as could be expected.  Past Medical History:  Diagnosis Date   Acute bronchitis 05/29/2012   Allergic rhinitis    Anemia in chronic kidney disease 02/27/2021   Aneurysm of right subclavian artery (HCC) 02/07/2023   Angina pectoris (HCC) 11/07/2020   Angina, class III (HCC) 11/07/2020   Asthma 06/18/2012   Followed in Pulmonary clinic/ [facility]/ [facility]  - 01/09/2018  After extensive coaching inhaler device  effectiveness =   75% > continue symb 160 2bid and return for pfts in 3 months ? RA bronchiolitis?  - PFT's  04/23/2018  FEV1 2.84 (91 % ) ratio 73  p no % improvement from saba p symb 160 prior to study with DLCO  76 % corrects to 79  % for alv volume   - 04/23/2018  After extensive coaching   Benign essential hypertension 06/20/2016   Bilateral inguinal hernia without obstruction or gangrene 06/20/2016   CAD (coronary artery disease), native coronary artery 06/20/2016   Chest pain, unspecified    Chronic ischemic heart disease, unspecified 01/28/2022   Chronic kidney disease 01/28/2022   COPD (chronic obstructive pulmonary disease) (HCC)    Coronary artery disease involving native coronary artery of native heart with angina pectoris (HCC) 06/20/2016   Cough  03/25/2012   Followed in Pulmonary clinic/ [facility]/ [facility]    - Trial off acei again  03/25/2012    - Sinus CT 01/24/11 There is mucosal thickening within the paranasal sinuses without  air-fluid levels. Neither ostiomeatal unit is patent.    Cough variant asthma 10/18/2010   Followed in Pulmonary clinic/ [facility]/ [facility]  -HFA 75% p coaching 01/22/2011  -PFT's  02/04/2011 minimal airflow obst, nl dlco    Disorder of bone and cartilage 01/22/2007   Qualifier: Diagnosis of  By: Drue Novel MD, Nolon Rod.   Overview:  Overview:  Qualifier: Diagnosis of  By: Drue Novel MD, Jose E.   Dysphagia 12/27/2022   Onset ? Aug 2022 p febrile illness ? Asp pna   -  MBS 01/13/23   PO Diet Recommendation: Regular; Thin liquids (Level 0)  Liquid Administration via: Cup; Spoon; Straw  Supervision: Patient able to self-feed  Postural changes: -- (try reclining when sipping liquids)  Oral care recommendations: Oral care QID (4x/day); Oral care before ice   chips/water  Recommended consults: Consider ENT consultation   Encounter for fitting and adjustment of hearing aid 01/29/2022   Essential hypertension 11/06/2017   GERD 01/22/2007   Annotation: history of esophageal stricture Qualifier: Diagnosis of  By: Drue Novel MD, Jose E.    GERD (gastroesophageal reflux disease)    Gout 01/28/2022   HIP FRACTURE 01/22/2007   Annotation: right-sided status post surgery Qualifier: Diagnosis of  By: Drue Novel  MD, Nolon Rod.    Hip fracture (HCC)    Hyperlipidemia 06/20/2016   Hyperlipidemia with target LDL less than 70 06/20/2016   Hypertension    HYPOGONADISM 02/18/2007   Qualifier: Diagnosis of  By: Drue Novel MD, Nolon Rod.    Hypogonadism male    Low back pain 09/09/2013   Multiple lung nodules on CT 11/21/2015   CT [place] 10/17/15 mpns > 15 y since quit smoking > rec 12 m f/u as this is low risk > done 06/04/16 no change rec recheck in 7m  - CT [place] 12/08/17 no change nodules > meets benign criteria > no directed f/u - Quantiferon GOLD TB 01/09/18  neg    Osteopenia    Personal history of malignant neoplasm of prostate    Postoperative visit 08/14/2016   Sensorineural hearing loss, bilateral 01/28/2022   Tremor, essential 02/18/2007   Overview:  Overview:  Qualifier: Diagnosis of  By: Drue Novel MD, Nolon Rod.  Last Assessment & Plan:  Reviewed tremor may increase on dulera so will need to be balanced against benefits to cough   Family History  Problem Relation Age of Onset   Heart disease Mother    Liver disease Mother    Parkinsonism Father    Asthma Father    Throat cancer Sister    Liver cancer Sister    Diabetes Sister    Asthma Sister    Liver cancer Sister    Heart disease Sister    Colon cancer Neg Hx    Past Surgical History:  Procedure Laterality Date   CAROTID-SUBCLAVIAN BYPASS GRAFT Right 03/11/2023   Procedure: RIGHT SUBCLAVIAN-CAROTID TRANSPOSITION;  Surgeon: Maeola Harman, MD;  Location: [facility] OR;  Service: Vascular;  Laterality: Right;   CORONARY ANGIOPLASTY WITH STENT PLACEMENT  11/04/2006   CORONARY STENT INTERVENTION N/A 11/16/2020   Procedure: CORONARY STENT INTERVENTION;  Surgeon: Marykay Lex, MD;  Location: [facility] INVASIVE CV LAB;  Service: Cardiovascular;  Laterality: N/A;   EYE SURGERY Bilateral 11/2022   cataract removal   INSERTION OF ILIAC STENT Left 03/11/2023   Procedure: INSERTION OF LEFT ILIAC STENT USING VIABAHN VBX X 54. STENT;  Surgeon: Maeola Harman, MD;  Location: [facility] OR;  Service: Vascular;  Laterality: Left;   INSERTION PROSTATE RADIATION SEED     LEFT HEART CATH AND CORONARY ANGIOGRAPHY N/A 11/16/2020   Procedure: LEFT HEART CATH AND CORONARY ANGIOGRAPHY;  Surgeon: Marykay Lex, MD;  Location: [facility] INVASIVE CV LAB;  Service: Cardiovascular;  Laterality: N/A;   PEG PLACEMENT N/A 03/19/2023   Procedure: PERCUTANEOUS ENDOSCOPIC GASTROSTOMY (PEG) PLACEMENT;  Surgeon: Diamantina Monks, MD;  Location: [facility] OR;  Service: General;  Laterality: N/A;   THORACIC AORTIC ENDOVASCULAR  STENT GRAFT Bilateral 03/11/2023   Procedure: THORACIC AORTIC ENDOVASCULAR STENT GRAFT USING ZENITH ALPHA X STENT (COOK);  Surgeon: Maeola Harman, MD;  Location: [facility] OR;  Service: Vascular;  Laterality: Bilateral;   ULTRASOUND GUIDANCE FOR VASCULAR ACCESS Bilateral 03/11/2023   Procedure: ULTRASOUND GUIDANCE FOR VASCULAR ACCESS;  Surgeon: Maeola Harman, MD;  Location: [facility] OR;  Service: Vascular;  Laterality: Bilateral;    Short Social History:  Social History   Tobacco Use   Smoking status: Former    Current packs/day: 0.00    Average packs/day: 1 pack/day for 35.0 years (35.0 ttl pk-yrs)    Types: Cigarettes    Start date: 11/04/1961    Quit date: 11/04/1996    Years since quitting: 26.8   Smokeless  tobacco: Never  Substance Use Topics   Alcohol use: Not Currently    Allergies  Allergen Reactions   Metoprolol Other (See Comments)    Weight gain   Penicillins Swelling   Morphine Rash    Current Outpatient Medications  Medication Sig Dispense Refill   allopurinol (ZYLOPRIM) 100 MG tablet Place 100 mg into feeding tube daily.     aspirin 325 MG tablet Crush1 tablet (325 mg total) and place into feeding tube daily. 100 tablet 0   budesonide-formoterol (SYMBICORT) 160-4.5 MCG/ACT inhaler Inhale 2 puffs into the lungs daily.     docusate (COLACE) 50 MG/5ML liquid Place 10 mLs (100 mg total) into feeding tube daily. 100 mL 3   famotidine (PEPCID) 40 MG tablet Place 1 tablet (40 mg total) into feeding tube daily.     fluticasone (FLONASE) 50 MCG/ACT nasal spray Place 2 sprays into the nose daily.     food thickener (SIMPLYTHICK, NECTAR/LEVEL 2/MILDLY THICK,) GEL Take 1 packet by mouth as needed.     loratadine (CLARITIN) 10 MG tablet Place 1 tablet (10 mg total) into feeding tube daily.     Multiple Vitamin (MULTIVITAMIN) capsule Place 1 capsule into feeding tube daily.     nitroGLYCERIN (NITROSTAT) 0.4 MG SL tablet Place 1 tablet (0.4 mg total) under the  tongue every 5 (five) minutes as needed for chest pain. 25 tablet 11   omeprazole (PRILOSEC) 40 MG capsule Take 40 mg by mouth every morning.     pravastatin (PRAVACHOL) 40 MG tablet Take 1 tablet (40 mg total) by mouth daily. 90 tablet 2   primidone (MYSOLINE) 50 MG tablet Place 1 tablet (50 mg total) into feeding tube at bedtime.     senna-docusate (SENOKOT-S) 8.6-50 MG tablet Place 1 tablet into feeding tube at bedtime as needed for mild constipation.     traMADol (ULTRAM) 50 MG tablet Place 1 tablet (50 mg total) into feeding tube every 6 (six) hours as needed for moderate pain.     No current facility-administered medications for this visit.    Review of Systems  Constitutional: Positive for fatigue.  HENT: Positive for trouble swallowing.  Eyes: Eyes negative.  Respiratory: Respiratory negative.  Cardiovascular: Cardiovascular negative.  GI: Gastrointestinal negative.  Musculoskeletal: Musculoskeletal negative.  Skin: Skin negative.  Neurological: Positive for focal weakness.  Hematologic: Hematologic/lymphatic negative.  Psychiatric: Psychiatric negative.        Objective:  Objective   Vitals:   08/20/23 1413 08/20/23 1416  BP: 129/72 125/79  Pulse: 76   Temp: 98 F (36.7 C)   TempSrc: Temporal   SpO2: 98%   Weight: 129 lb 4.8 oz (58.7 kg)   Height: 5\' 10"  (1.778 m)    Body mass index is 18.55 kg/m.  Physical Exam HENT:     Head: Normocephalic.     Comments: Voice is sounding stronger    Mouth/Throat:     Mouth: Mucous membranes are moist.  Eyes:     Pupils: Pupils are equal, round, and reactive to light.  Cardiovascular:     Rate and Rhythm: Normal rate.     Pulses: Normal pulses.  Pulmonary:     Effort: Pulmonary effort is normal.  Musculoskeletal:        General: Normal range of motion.     Right lower leg: No edema.     Left lower leg: No edema.  Skin:    General: Skin is warm.     Capillary Refill: Capillary refill takes less  than 2 seconds.   Neurological:     General: No focal deficit present.     Mental Status: He is alert.  Psychiatric:        Mood and Affect: Mood normal.     Data: CTA neck IMPRESSION: 1. Aortic arch stent excluding a non-opacified diverticulum of Kommerell. The degree of thrombosis is more complete than on 03/19/23. 2. Bilateral carotid atherosclerosis without hemodynamically significant stenosis.       Assessment/Plan:    82 year old male status post treatment of Kommerell diverticulum with right subclavian to carotid artery transposition and TEVAR.  From a CT scan standpoint the diverticulum has mostly thrombosed there appears to be more space for the esophagus.  Unfortunately due to nerve injury the patient is still having difficulty with swallowing continues to work with speech therapy and his voice is much stronger after Botox injection of his vocal cord.  Plan is for continued work with speech therapy and he will have swallow evaluation in the near future.  I will have him follow back up with CT scan in 6 months.  Hopefully he will regain the ability to swallow and his voice will continue to strengthen.  If he has issues before 6 months I am happy to see him before then.    Maeola Harman MD [facility]

## 2023-08-20 NOTE — Telephone Encounter (Signed)
Contacted pt to schedule an appt. Unable to reach via phone, voicemail was left.   Scheduling Message Entered by Domenic Schwab on 08/07/2023 at  4:38 PM Priority: High <No visit type provided>  Department: [facility]  Provider:  Scheduling Notes:  We can hold off starting it until her hemoglobin is less than 10.  Will you go ahead and add her an appt for labs around 10/20?  ----- Message -----  From: Otilio Miu  Sent: 08/07/2023  10:32 AM EDT  To: Weston Settle, MD; Domenic Schwab, RPH    Good morning,    Pt had recent labs and his Hemoglobin was at 10.3 per pt on September 20th at his PCP office. Is this still warranted? Pt wants me to ask to make sure.    Please advise,    Thank you!    Otilio Miu  ----- Message -----  From: Domenic Schwab, [facility]  Sent: 08/06/2023   4:34 PM EDT  To: Scheduling Message Pool

## 2023-08-21 DIAGNOSIS — R1312 Dysphagia, oropharyngeal phase: Secondary | ICD-10-CM | POA: Diagnosis not present

## 2023-08-21 DIAGNOSIS — Z931 Gastrostomy status: Secondary | ICD-10-CM | POA: Diagnosis not present

## 2023-08-22 DIAGNOSIS — K5900 Constipation, unspecified: Secondary | ICD-10-CM | POA: Diagnosis not present

## 2023-08-22 DIAGNOSIS — Z955 Presence of coronary angioplasty implant and graft: Secondary | ICD-10-CM | POA: Diagnosis not present

## 2023-08-22 DIAGNOSIS — D631 Anemia in chronic kidney disease: Secondary | ICD-10-CM | POA: Diagnosis not present

## 2023-08-22 DIAGNOSIS — Z8701 Personal history of pneumonia (recurrent): Secondary | ICD-10-CM | POA: Diagnosis not present

## 2023-08-22 DIAGNOSIS — K219 Gastro-esophageal reflux disease without esophagitis: Secondary | ICD-10-CM | POA: Diagnosis not present

## 2023-08-22 DIAGNOSIS — N189 Chronic kidney disease, unspecified: Secondary | ICD-10-CM | POA: Diagnosis not present

## 2023-08-22 DIAGNOSIS — Q2549 Other congenital malformations of aorta: Secondary | ICD-10-CM | POA: Diagnosis not present

## 2023-08-22 DIAGNOSIS — G250 Essential tremor: Secondary | ICD-10-CM | POA: Diagnosis not present

## 2023-08-22 DIAGNOSIS — M8580 Other specified disorders of bone density and structure, unspecified site: Secondary | ICD-10-CM | POA: Diagnosis not present

## 2023-08-22 DIAGNOSIS — I2510 Atherosclerotic heart disease of native coronary artery without angina pectoris: Secondary | ICD-10-CM | POA: Diagnosis not present

## 2023-08-22 DIAGNOSIS — E785 Hyperlipidemia, unspecified: Secondary | ICD-10-CM | POA: Diagnosis not present

## 2023-08-22 DIAGNOSIS — H903 Sensorineural hearing loss, bilateral: Secondary | ICD-10-CM | POA: Diagnosis not present

## 2023-08-22 DIAGNOSIS — R911 Solitary pulmonary nodule: Secondary | ICD-10-CM | POA: Diagnosis not present

## 2023-08-22 DIAGNOSIS — M109 Gout, unspecified: Secondary | ICD-10-CM | POA: Diagnosis not present

## 2023-08-22 DIAGNOSIS — Z79899 Other long term (current) drug therapy: Secondary | ICD-10-CM | POA: Diagnosis not present

## 2023-08-22 DIAGNOSIS — Z7982 Long term (current) use of aspirin: Secondary | ICD-10-CM | POA: Diagnosis not present

## 2023-08-22 DIAGNOSIS — I129 Hypertensive chronic kidney disease with stage 1 through stage 4 chronic kidney disease, or unspecified chronic kidney disease: Secondary | ICD-10-CM | POA: Diagnosis not present

## 2023-08-22 DIAGNOSIS — J4489 Other specified chronic obstructive pulmonary disease: Secondary | ICD-10-CM | POA: Diagnosis not present

## 2023-08-22 DIAGNOSIS — Z431 Encounter for attention to gastrostomy: Secondary | ICD-10-CM | POA: Diagnosis not present

## 2023-08-22 DIAGNOSIS — R1312 Dysphagia, oropharyngeal phase: Secondary | ICD-10-CM | POA: Diagnosis not present

## 2023-08-22 DIAGNOSIS — E43 Unspecified severe protein-calorie malnutrition: Secondary | ICD-10-CM | POA: Diagnosis not present

## 2023-08-22 DIAGNOSIS — Z7951 Long term (current) use of inhaled steroids: Secondary | ICD-10-CM | POA: Diagnosis not present

## 2023-08-25 DIAGNOSIS — E785 Hyperlipidemia, unspecified: Secondary | ICD-10-CM | POA: Diagnosis not present

## 2023-08-25 DIAGNOSIS — I129 Hypertensive chronic kidney disease with stage 1 through stage 4 chronic kidney disease, or unspecified chronic kidney disease: Secondary | ICD-10-CM | POA: Diagnosis not present

## 2023-08-25 DIAGNOSIS — R911 Solitary pulmonary nodule: Secondary | ICD-10-CM | POA: Diagnosis not present

## 2023-08-25 DIAGNOSIS — J4489 Other specified chronic obstructive pulmonary disease: Secondary | ICD-10-CM | POA: Diagnosis not present

## 2023-08-25 DIAGNOSIS — Z431 Encounter for attention to gastrostomy: Secondary | ICD-10-CM | POA: Diagnosis not present

## 2023-08-25 DIAGNOSIS — I2510 Atherosclerotic heart disease of native coronary artery without angina pectoris: Secondary | ICD-10-CM | POA: Diagnosis not present

## 2023-08-25 DIAGNOSIS — Z8701 Personal history of pneumonia (recurrent): Secondary | ICD-10-CM | POA: Diagnosis not present

## 2023-08-25 DIAGNOSIS — G250 Essential tremor: Secondary | ICD-10-CM | POA: Diagnosis not present

## 2023-08-25 DIAGNOSIS — K219 Gastro-esophageal reflux disease without esophagitis: Secondary | ICD-10-CM | POA: Diagnosis not present

## 2023-08-25 DIAGNOSIS — M109 Gout, unspecified: Secondary | ICD-10-CM | POA: Diagnosis not present

## 2023-08-25 DIAGNOSIS — Q2549 Other congenital malformations of aorta: Secondary | ICD-10-CM | POA: Diagnosis not present

## 2023-08-25 DIAGNOSIS — Z7982 Long term (current) use of aspirin: Secondary | ICD-10-CM | POA: Diagnosis not present

## 2023-08-25 DIAGNOSIS — H903 Sensorineural hearing loss, bilateral: Secondary | ICD-10-CM | POA: Diagnosis not present

## 2023-08-25 DIAGNOSIS — Z79899 Other long term (current) drug therapy: Secondary | ICD-10-CM | POA: Diagnosis not present

## 2023-08-25 DIAGNOSIS — Z955 Presence of coronary angioplasty implant and graft: Secondary | ICD-10-CM | POA: Diagnosis not present

## 2023-08-25 DIAGNOSIS — Z7951 Long term (current) use of inhaled steroids: Secondary | ICD-10-CM | POA: Diagnosis not present

## 2023-08-25 DIAGNOSIS — K5900 Constipation, unspecified: Secondary | ICD-10-CM | POA: Diagnosis not present

## 2023-08-25 DIAGNOSIS — M8580 Other specified disorders of bone density and structure, unspecified site: Secondary | ICD-10-CM | POA: Diagnosis not present

## 2023-08-25 DIAGNOSIS — D631 Anemia in chronic kidney disease: Secondary | ICD-10-CM | POA: Diagnosis not present

## 2023-08-25 DIAGNOSIS — R1312 Dysphagia, oropharyngeal phase: Secondary | ICD-10-CM | POA: Diagnosis not present

## 2023-08-25 DIAGNOSIS — N189 Chronic kidney disease, unspecified: Secondary | ICD-10-CM | POA: Diagnosis not present

## 2023-08-25 DIAGNOSIS — E43 Unspecified severe protein-calorie malnutrition: Secondary | ICD-10-CM | POA: Diagnosis not present

## 2023-08-26 ENCOUNTER — Other Ambulatory Visit (HOSPITAL_COMMUNITY): Payer: Self-pay | Admitting: Surgery

## 2023-08-26 DIAGNOSIS — I2510 Atherosclerotic heart disease of native coronary artery without angina pectoris: Secondary | ICD-10-CM | POA: Diagnosis not present

## 2023-08-26 DIAGNOSIS — Z431 Encounter for attention to gastrostomy: Secondary | ICD-10-CM | POA: Diagnosis not present

## 2023-08-26 DIAGNOSIS — R1312 Dysphagia, oropharyngeal phase: Secondary | ICD-10-CM | POA: Diagnosis not present

## 2023-08-26 DIAGNOSIS — H903 Sensorineural hearing loss, bilateral: Secondary | ICD-10-CM | POA: Diagnosis not present

## 2023-08-26 DIAGNOSIS — Z7982 Long term (current) use of aspirin: Secondary | ICD-10-CM | POA: Diagnosis not present

## 2023-08-26 DIAGNOSIS — K219 Gastro-esophageal reflux disease without esophagitis: Secondary | ICD-10-CM | POA: Diagnosis not present

## 2023-08-26 DIAGNOSIS — E785 Hyperlipidemia, unspecified: Secondary | ICD-10-CM | POA: Diagnosis not present

## 2023-08-26 DIAGNOSIS — J4489 Other specified chronic obstructive pulmonary disease: Secondary | ICD-10-CM | POA: Diagnosis not present

## 2023-08-26 DIAGNOSIS — K5900 Constipation, unspecified: Secondary | ICD-10-CM | POA: Diagnosis not present

## 2023-08-26 DIAGNOSIS — Z955 Presence of coronary angioplasty implant and graft: Secondary | ICD-10-CM | POA: Diagnosis not present

## 2023-08-26 DIAGNOSIS — Z79899 Other long term (current) drug therapy: Secondary | ICD-10-CM | POA: Diagnosis not present

## 2023-08-26 DIAGNOSIS — M8580 Other specified disorders of bone density and structure, unspecified site: Secondary | ICD-10-CM | POA: Diagnosis not present

## 2023-08-26 DIAGNOSIS — Z7951 Long term (current) use of inhaled steroids: Secondary | ICD-10-CM | POA: Diagnosis not present

## 2023-08-26 DIAGNOSIS — D631 Anemia in chronic kidney disease: Secondary | ICD-10-CM | POA: Diagnosis not present

## 2023-08-26 DIAGNOSIS — M109 Gout, unspecified: Secondary | ICD-10-CM | POA: Diagnosis not present

## 2023-08-26 DIAGNOSIS — N390 Urinary tract infection, site not specified: Secondary | ICD-10-CM | POA: Diagnosis not present

## 2023-08-26 DIAGNOSIS — N189 Chronic kidney disease, unspecified: Secondary | ICD-10-CM | POA: Diagnosis not present

## 2023-08-26 DIAGNOSIS — I129 Hypertensive chronic kidney disease with stage 1 through stage 4 chronic kidney disease, or unspecified chronic kidney disease: Secondary | ICD-10-CM | POA: Diagnosis not present

## 2023-08-26 DIAGNOSIS — Z8701 Personal history of pneumonia (recurrent): Secondary | ICD-10-CM | POA: Diagnosis not present

## 2023-08-26 DIAGNOSIS — Q2549 Other congenital malformations of aorta: Secondary | ICD-10-CM | POA: Diagnosis not present

## 2023-08-26 DIAGNOSIS — R911 Solitary pulmonary nodule: Secondary | ICD-10-CM | POA: Diagnosis not present

## 2023-08-26 DIAGNOSIS — G250 Essential tremor: Secondary | ICD-10-CM | POA: Diagnosis not present

## 2023-08-26 DIAGNOSIS — E43 Unspecified severe protein-calorie malnutrition: Secondary | ICD-10-CM | POA: Diagnosis not present

## 2023-08-29 DIAGNOSIS — R911 Solitary pulmonary nodule: Secondary | ICD-10-CM | POA: Diagnosis not present

## 2023-08-29 DIAGNOSIS — Z955 Presence of coronary angioplasty implant and graft: Secondary | ICD-10-CM | POA: Diagnosis not present

## 2023-08-29 DIAGNOSIS — Z7982 Long term (current) use of aspirin: Secondary | ICD-10-CM | POA: Diagnosis not present

## 2023-08-29 DIAGNOSIS — K5900 Constipation, unspecified: Secondary | ICD-10-CM | POA: Diagnosis not present

## 2023-08-29 DIAGNOSIS — G250 Essential tremor: Secondary | ICD-10-CM | POA: Diagnosis not present

## 2023-08-29 DIAGNOSIS — M8580 Other specified disorders of bone density and structure, unspecified site: Secondary | ICD-10-CM | POA: Diagnosis not present

## 2023-08-29 DIAGNOSIS — Z431 Encounter for attention to gastrostomy: Secondary | ICD-10-CM | POA: Diagnosis not present

## 2023-08-29 DIAGNOSIS — I129 Hypertensive chronic kidney disease with stage 1 through stage 4 chronic kidney disease, or unspecified chronic kidney disease: Secondary | ICD-10-CM | POA: Diagnosis not present

## 2023-08-29 DIAGNOSIS — Q2549 Other congenital malformations of aorta: Secondary | ICD-10-CM | POA: Diagnosis not present

## 2023-08-29 DIAGNOSIS — N189 Chronic kidney disease, unspecified: Secondary | ICD-10-CM | POA: Diagnosis not present

## 2023-08-29 DIAGNOSIS — J4489 Other specified chronic obstructive pulmonary disease: Secondary | ICD-10-CM | POA: Diagnosis not present

## 2023-08-29 DIAGNOSIS — M109 Gout, unspecified: Secondary | ICD-10-CM | POA: Diagnosis not present

## 2023-08-29 DIAGNOSIS — I2510 Atherosclerotic heart disease of native coronary artery without angina pectoris: Secondary | ICD-10-CM | POA: Diagnosis not present

## 2023-08-29 DIAGNOSIS — K219 Gastro-esophageal reflux disease without esophagitis: Secondary | ICD-10-CM | POA: Diagnosis not present

## 2023-08-29 DIAGNOSIS — Z79899 Other long term (current) drug therapy: Secondary | ICD-10-CM | POA: Diagnosis not present

## 2023-08-29 DIAGNOSIS — Z7951 Long term (current) use of inhaled steroids: Secondary | ICD-10-CM | POA: Diagnosis not present

## 2023-08-29 DIAGNOSIS — H903 Sensorineural hearing loss, bilateral: Secondary | ICD-10-CM | POA: Diagnosis not present

## 2023-08-29 DIAGNOSIS — D631 Anemia in chronic kidney disease: Secondary | ICD-10-CM | POA: Diagnosis not present

## 2023-08-29 DIAGNOSIS — E785 Hyperlipidemia, unspecified: Secondary | ICD-10-CM | POA: Diagnosis not present

## 2023-08-29 DIAGNOSIS — Z8701 Personal history of pneumonia (recurrent): Secondary | ICD-10-CM | POA: Diagnosis not present

## 2023-08-29 DIAGNOSIS — R1312 Dysphagia, oropharyngeal phase: Secondary | ICD-10-CM | POA: Diagnosis not present

## 2023-08-29 DIAGNOSIS — E43 Unspecified severe protein-calorie malnutrition: Secondary | ICD-10-CM | POA: Diagnosis not present

## 2023-09-01 DIAGNOSIS — R911 Solitary pulmonary nodule: Secondary | ICD-10-CM | POA: Diagnosis not present

## 2023-09-01 DIAGNOSIS — M8580 Other specified disorders of bone density and structure, unspecified site: Secondary | ICD-10-CM | POA: Diagnosis not present

## 2023-09-01 DIAGNOSIS — Z8701 Personal history of pneumonia (recurrent): Secondary | ICD-10-CM | POA: Diagnosis not present

## 2023-09-01 DIAGNOSIS — Z955 Presence of coronary angioplasty implant and graft: Secondary | ICD-10-CM | POA: Diagnosis not present

## 2023-09-01 DIAGNOSIS — Z431 Encounter for attention to gastrostomy: Secondary | ICD-10-CM | POA: Diagnosis not present

## 2023-09-01 DIAGNOSIS — I129 Hypertensive chronic kidney disease with stage 1 through stage 4 chronic kidney disease, or unspecified chronic kidney disease: Secondary | ICD-10-CM | POA: Diagnosis not present

## 2023-09-01 DIAGNOSIS — J4489 Other specified chronic obstructive pulmonary disease: Secondary | ICD-10-CM | POA: Diagnosis not present

## 2023-09-01 DIAGNOSIS — Z79899 Other long term (current) drug therapy: Secondary | ICD-10-CM | POA: Diagnosis not present

## 2023-09-01 DIAGNOSIS — R1312 Dysphagia, oropharyngeal phase: Secondary | ICD-10-CM | POA: Diagnosis not present

## 2023-09-01 DIAGNOSIS — K5900 Constipation, unspecified: Secondary | ICD-10-CM | POA: Diagnosis not present

## 2023-09-01 DIAGNOSIS — Z7982 Long term (current) use of aspirin: Secondary | ICD-10-CM | POA: Diagnosis not present

## 2023-09-01 DIAGNOSIS — G250 Essential tremor: Secondary | ICD-10-CM | POA: Diagnosis not present

## 2023-09-01 DIAGNOSIS — D631 Anemia in chronic kidney disease: Secondary | ICD-10-CM | POA: Diagnosis not present

## 2023-09-01 DIAGNOSIS — N189 Chronic kidney disease, unspecified: Secondary | ICD-10-CM | POA: Diagnosis not present

## 2023-09-01 DIAGNOSIS — Q2549 Other congenital malformations of aorta: Secondary | ICD-10-CM | POA: Diagnosis not present

## 2023-09-01 DIAGNOSIS — M109 Gout, unspecified: Secondary | ICD-10-CM | POA: Diagnosis not present

## 2023-09-01 DIAGNOSIS — E785 Hyperlipidemia, unspecified: Secondary | ICD-10-CM | POA: Diagnosis not present

## 2023-09-01 DIAGNOSIS — E43 Unspecified severe protein-calorie malnutrition: Secondary | ICD-10-CM | POA: Diagnosis not present

## 2023-09-01 DIAGNOSIS — Z7951 Long term (current) use of inhaled steroids: Secondary | ICD-10-CM | POA: Diagnosis not present

## 2023-09-01 DIAGNOSIS — H903 Sensorineural hearing loss, bilateral: Secondary | ICD-10-CM | POA: Diagnosis not present

## 2023-09-01 DIAGNOSIS — I2510 Atherosclerotic heart disease of native coronary artery without angina pectoris: Secondary | ICD-10-CM | POA: Diagnosis not present

## 2023-09-01 DIAGNOSIS — K219 Gastro-esophageal reflux disease without esophagitis: Secondary | ICD-10-CM | POA: Diagnosis not present

## 2023-09-02 ENCOUNTER — Inpatient Hospital Stay: Payer: Medicare HMO | Attending: Oncology

## 2023-09-02 DIAGNOSIS — N182 Chronic kidney disease, stage 2 (mild): Secondary | ICD-10-CM | POA: Diagnosis present

## 2023-09-02 DIAGNOSIS — D631 Anemia in chronic kidney disease: Secondary | ICD-10-CM | POA: Insufficient documentation

## 2023-09-02 LAB — CBC WITH DIFFERENTIAL (CANCER CENTER ONLY)
Abs Immature Granulocytes: 0.02 10*3/uL (ref 0.00–0.07)
Basophils Absolute: 0 10*3/uL (ref 0.0–0.1)
Basophils Relative: 1 %
Eosinophils Absolute: 0.2 10*3/uL (ref 0.0–0.5)
Eosinophils Relative: 3 %
HCT: 33.4 % — ABNORMAL LOW (ref 39.0–52.0)
Hemoglobin: 10.7 g/dL — ABNORMAL LOW (ref 13.0–17.0)
Immature Granulocytes: 0 %
Lymphocytes Relative: 18 %
Lymphs Abs: 1.3 10*3/uL (ref 0.7–4.0)
MCH: 32.6 pg (ref 26.0–34.0)
MCHC: 32 g/dL (ref 30.0–36.0)
MCV: 101.8 fL — ABNORMAL HIGH (ref 80.0–100.0)
Monocytes Absolute: 0.6 10*3/uL (ref 0.1–1.0)
Monocytes Relative: 8 %
Neutro Abs: 5.2 10*3/uL (ref 1.7–7.7)
Neutrophils Relative %: 70 %
Platelet Count: 194 10*3/uL (ref 150–400)
RBC: 3.28 MIL/uL — ABNORMAL LOW (ref 4.22–5.81)
RDW: 15.5 % (ref 11.5–15.5)
WBC Count: 7.4 10*3/uL (ref 4.0–10.5)
nRBC: 0 % (ref 0.0–0.2)

## 2023-09-03 ENCOUNTER — Encounter: Payer: Self-pay | Admitting: Cardiology

## 2023-09-03 ENCOUNTER — Ambulatory Visit: Payer: Medicare HMO | Attending: Cardiology | Admitting: Cardiology

## 2023-09-03 VITALS — BP 144/74 | HR 68 | Ht 70.0 in | Wt 131.6 lb

## 2023-09-03 DIAGNOSIS — I25119 Atherosclerotic heart disease of native coronary artery with unspecified angina pectoris: Secondary | ICD-10-CM | POA: Diagnosis not present

## 2023-09-03 DIAGNOSIS — E785 Hyperlipidemia, unspecified: Secondary | ICD-10-CM

## 2023-09-03 DIAGNOSIS — I728 Aneurysm of other specified arteries: Secondary | ICD-10-CM

## 2023-09-03 DIAGNOSIS — I10 Essential (primary) hypertension: Secondary | ICD-10-CM

## 2023-09-03 NOTE — Progress Notes (Signed)
Cardiology Office Note:    Date:  09/03/2023   ID:  Brian Dunn [place], DOB 1941/04/05, MRN [id number]  PCP:  Annamaria Helling, DO  Cardiologist:  Garwin Brothers, MD   Referring MD: Physicians, Cheryln Manly F*    ASSESSMENT:    1. Aneurysm of right subclavian artery (HCC)   2. Benign essential hypertension   3. Coronary artery disease involving native coronary artery of native heart with angina pectoris (HCC)   4. Hyperlipidemia with target LDL less than 70    PLAN:    In order of problems listed above:  Post subclavian artery aneurysm in the artery that has aberrant origin.  Patient is recovered well from this.  His only problem is his swallowing and he has a PEG tube.  He is receiving therapy for swallowing.  He is optimistic about the situation. Essential hypertension: Blood pressure stable and diet was emphasized.  Lifestyle modification urged. Mixed dyslipidemia: On lipid-lowering medications followed by primary care.  Lipids reviewed. Patient will be seen in follow-up appointment in 6 months or earlier if the patient has any concerns.   Medication Adjustments/Labs and Tests Ordered: Current medicines are reviewed at length with the patient today.  Concerns regarding medicines are outlined above.  No orders of the defined types were placed in this encounter.  No orders of the defined types were placed in this encounter.    No chief complaint on file.    History of Present Illness:    Brian Dunn is a 82 y.o. male.  Patient has past medical history of essential hypertension, mixed dyslipidemia and subclavian artery with aberrant origin and aneurysm for which she underwent surgery.  He appears to have normal dimension vocal cord and swallowing issues subsequently and has a PEG.  Past Medical History:  Diagnosis Date   Acute bronchitis 05/29/2012   Allergic rhinitis    Anemia in chronic kidney disease 02/27/2021   Aneurysm of right subclavian artery (HCC)  02/07/2023   Angina pectoris (HCC) 11/07/2020   Angina, class III (HCC) 11/07/2020   Asthma 06/18/2012   Followed in Pulmonary clinic/ [facility]/ [place]  - 01/09/2018  After extensive coaching inhaler device  effectiveness =   75% > continue symb 160 2bid and return for pfts in 3 months ? RA bronchiolitis?  - PFT's  04/23/2018  FEV1 2.84 (91 % ) ratio 73  p no % improvement from saba p symb 160 prior to study with DLCO  76 % corrects to 79  % for alv volume   - 04/23/2018  After extensive coaching   Benign essential hypertension 06/20/2016   Bilateral inguinal hernia without obstruction or gangrene 06/20/2016   CAD (coronary artery disease), native coronary artery 06/20/2016   Chest pain, unspecified    Chronic ischemic heart disease, unspecified 01/28/2022   Chronic kidney disease 01/28/2022   Closed fracture of part of neck of femur (HCC) 01/22/2007   Annotation: right-sided status post surgery  Qualifier: Diagnosis of   By: Drue Novel MD, Nolon Rod     IMO SNOMED Dx Update Oct 2024     COPD (chronic obstructive pulmonary disease) (HCC)    Coronary artery disease involving native coronary artery of native heart with angina pectoris (HCC) 06/20/2016   Cough 03/25/2012   Followed in Pulmonary clinic/ [facility]/ [place]    - Trial off acei again  03/25/2012    - Sinus CT 01/24/11 There is mucosal thickening within the paranasal sinuses without  air-fluid levels.  Neither ostiomeatal unit is patent.    Cough variant asthma 10/18/2010   Followed in Pulmonary clinic/ [facility]/ [place]  -HFA 75% p coaching 01/22/2011  -PFT's  02/04/2011 minimal airflow obst, nl dlco    Disorder of bone and cartilage 01/22/2007   Qualifier: Diagnosis of  By: Drue Novel MD, Nolon Rod.   Overview:  Overview:  Qualifier: Diagnosis of  By: Drue Novel MD, Jose E.   Dysphagia 12/27/2022   Onset ? Aug 2022 p febrile illness ? Asp pna   -  MBS 01/13/23   PO Diet Recommendation: Regular; Thin liquids (Level 0)  Liquid Administration via: Cup;  Spoon; Straw  Supervision: Patient able to self-feed  Postural changes: -- (try reclining when sipping liquids)  Oral care recommendations: Oral care QID (4x/day); Oral care before ice   chips/water  Recommended consults: Consider ENT consultation   Encounter for fitting and adjustment of hearing aid 01/29/2022   Esophageal dysmotility 03/15/2023   Essential hypertension 11/06/2017   GERD 01/22/2007   Annotation: history of esophageal stricture Qualifier: Diagnosis of  By: Drue Novel MD, Nolon Rod.    GERD (gastroesophageal reflux disease)    Gout 01/28/2022   Hip fracture (HCC)    Hyperlipidemia 06/20/2016   Hyperlipidemia with target LDL less than 70 06/20/2016   Hypertension    HYPOGONADISM 02/18/2007   Qualifier: Diagnosis of  By: Drue Novel MD, Nolon Rod.    Hypogonadism male    Kommerell's diverticulum 03/11/2023   Low back pain 09/09/2013   Multiple lung nodules on CT 11/21/2015   CT [place] 10/17/15 mpns > 15 y since quit smoking > rec 12 m f/u as this is low risk > done 06/04/16 no change rec recheck in 54m  - CT [place] 12/08/17 no change nodules > meets benign criteria > no directed f/u - Quantiferon GOLD TB 01/09/18 neg    Osteopenia    Personal history of malignant neoplasm of prostate    Postoperative visit 08/14/2016   Preop cardiovascular exam 02/26/2023   Protein-calorie malnutrition, severe 03/14/2023   Sensorineural hearing loss, bilateral 01/28/2022   Syncope 03/27/2023   Tremor, essential 02/18/2007   Overview:  Overview:  Qualifier: Diagnosis of  By: Drue Novel MD, Nolon Rod.  Last Assessment & Plan:  Reviewed tremor may increase on dulera so will need to be balanced against benefits to cough    Past Surgical History:  Procedure Laterality Date   CAROTID-SUBCLAVIAN BYPASS GRAFT Right 03/11/2023   Procedure: RIGHT SUBCLAVIAN-CAROTID TRANSPOSITION;  Surgeon: Maeola Harman, MD;  Location: [facility] OR;  Service: Vascular;  Laterality: Right;   CORONARY ANGIOPLASTY WITH STENT PLACEMENT   11/04/2006   CORONARY STENT INTERVENTION N/A 11/16/2020   Procedure: CORONARY STENT INTERVENTION;  Surgeon: Marykay Lex, MD;  Location: [facility] INVASIVE CV LAB;  Service: Cardiovascular;  Laterality: N/A;   EYE SURGERY Bilateral 11/2022   cataract removal   INSERTION OF ILIAC STENT Left 03/11/2023   Procedure: INSERTION OF LEFT ILIAC STENT USING VIABAHN VBX X 54. STENT;  Surgeon: Maeola Harman, MD;  Location: [facility] OR;  Service: Vascular;  Laterality: Left;   INSERTION PROSTATE RADIATION SEED     LEFT HEART CATH AND CORONARY ANGIOGRAPHY N/A 11/16/2020   Procedure: LEFT HEART CATH AND CORONARY ANGIOGRAPHY;  Surgeon: Marykay Lex, MD;  Location: [facility] INVASIVE CV LAB;  Service: Cardiovascular;  Laterality: N/A;   PEG PLACEMENT N/A 03/19/2023   Procedure: PERCUTANEOUS ENDOSCOPIC GASTROSTOMY (PEG) PLACEMENT;  Surgeon: Diamantina Monks, MD;  Location: [facility] OR;  Service: General;  Laterality: N/A;   THORACIC AORTIC ENDOVASCULAR STENT GRAFT Bilateral 03/11/2023   Procedure: THORACIC AORTIC ENDOVASCULAR STENT GRAFT USING ZENITH ALPHA X STENT (COOK);  Surgeon: Maeola Harman, MD;  Location: [facility] OR;  Service: Vascular;  Laterality: Bilateral;   ULTRASOUND GUIDANCE FOR VASCULAR ACCESS Bilateral 03/11/2023   Procedure: ULTRASOUND GUIDANCE FOR VASCULAR ACCESS;  Surgeon: Maeola Harman, MD;  Location: [facility] OR;  Service: Vascular;  Laterality: Bilateral;    Current Medications: Current Meds  Medication Sig   allopurinol (ZYLOPRIM) 100 MG tablet Place 100 mg into feeding tube daily.   aspirin 325 MG tablet Crush1 tablet (325 mg total) and place into feeding tube daily.   budesonide-formoterol (SYMBICORT) 160-4.5 MCG/ACT inhaler Inhale 2 puffs into the lungs daily.   docusate (COLACE) 50 MG/5ML liquid Place 10 mLs (100 mg total) into feeding tube daily.   famotidine (PEPCID) 40 MG tablet Place 1 tablet (40 mg total) into feeding tube daily.   finasteride (PROSCAR) 5 MG  tablet Take 5 mg by mouth daily.   fluticasone (FLONASE) 50 MCG/ACT nasal spray Place 2 sprays into the nose daily.   food thickener (SIMPLYTHICK, NECTAR/LEVEL 2/MILDLY THICK,) GEL Take 1 packet by mouth as needed.   loratadine (CLARITIN) 10 MG tablet Place 1 tablet (10 mg total) into feeding tube daily.   Multiple Vitamin (MULTIVITAMIN) capsule Place 1 capsule into feeding tube daily.   nitroGLYCERIN (NITROSTAT) 0.4 MG SL tablet Place 1 tablet (0.4 mg total) under the tongue every 5 (five) minutes as needed for chest pain.   omeprazole (PRILOSEC) 40 MG capsule Take 40 mg by mouth every morning.   pravastatin (PRAVACHOL) 40 MG tablet Take 1 tablet (40 mg total) by mouth daily.   primidone (MYSOLINE) 50 MG tablet Place 1 tablet (50 mg total) into feeding tube at bedtime.   senna-docusate (SENOKOT-S) 8.6-50 MG tablet Place 1 tablet into feeding tube at bedtime as needed for mild constipation.   traMADol (ULTRAM) 50 MG tablet Place 1 tablet (50 mg total) into feeding tube every 6 (six) hours as needed for moderate pain.     Allergies:   Metoprolol, Penicillins, and Morphine   Social History   Socioeconomic History   Marital status: Married    Spouse name: Rose   Number of children: 2   Years of education: Not on file   Highest education level: Not on file  Occupational History   Occupation: retired  Tobacco Use   Smoking status: Former    Current packs/day: 0.00    Average packs/day: 1 pack/day for 35.0 years (35.0 ttl pk-yrs)    Types: Cigarettes    Start date: 11/04/1961    Quit date: 11/04/1996    Years since quitting: 26.8   Smokeless tobacco: Never  Vaping Use   Vaping status: Never Used  Substance and Sexual Activity   Alcohol use: Not Currently   Drug use: Never   Sexual activity: Not Currently  Other Topics Concern   Not on file  Social History Narrative   Married and has children.  Works in Air[employer].   Social Determinants of Health   Financial Resource Strain: Not on  file  Food Insecurity: No Food Insecurity (03/27/2023)   Hunger Vital Sign    Worried About Running Out of Food in the Last Year: Never true    Ran Out of Food in the Last Year: Never true  Transportation Needs: No Transportation Needs (03/27/2023)   PRAPARE - Transportation  Lack of Transportation (Medical): No    Lack of Transportation (Non-Medical): No  Physical Activity: Not on file  Stress: No Stress Concern Present (04/14/2023)   [employer] of Occupational Health - Occupational Stress Questionnaire    Feeling of Stress : Only a little  Social Connections: Not on file     Family History: The patient's family history includes Asthma in his father and sister; Diabetes in his sister; Heart disease in his mother and sister; Liver cancer in his sister and sister; Liver disease in his mother; Parkinsonism in his father; Throat cancer in his sister. There is no history of Colon cancer.  ROS:   Please see the history of present illness.    All other systems reviewed and are negative.  EKGs/Labs/Other Studies Reviewed:    The following studies were reviewed today: I discussed my findings with the patient at length   Recent Labs: 03/28/2023: Magnesium 1.9 07/24/2023: ALT 15; BUN 30; Creatinine 0.77; Potassium 4.6; Sodium 130 09/02/2023: Hemoglobin 10.7; Platelet Count 194  Recent Lipid Panel    Component Value Date/Time   CHOL 113 03/26/2023 2235   CHOL 144 02/07/2023 1007   TRIG 42 03/26/2023 2235   HDL 41 03/26/2023 2235   HDL 74 02/07/2023 1007   CHOLHDL 2.8 03/26/2023 2235   VLDL 8 03/26/2023 2235   LDLCALC 64 03/26/2023 2235   LDLCALC 58 02/07/2023 1007   LDLDIRECT 117.9 01/22/2007 0923    Physical Exam:    VS:  BP (!) 144/74   Pulse 68   Ht 5\' 10"  (1.778 m)   Wt 131 lb 9.6 oz (59.7 kg)   SpO2 97%   BMI 18.88 kg/m     Wt Readings from Last 3 Encounters:  09/03/23 131 lb 9.6 oz (59.7 kg)  08/20/23 129 lb 4.8 oz (58.7 kg)  08/19/23 129 lb 9.6 oz (58.8  kg)     GEN: Patient is in no acute distress HEENT: Normal NECK: No JVD; No carotid bruits LYMPHATICS: No lymphadenopathy CARDIAC: Hear sounds regular, 2/6 systolic murmur at the apex. RESPIRATORY:  Clear to auscultation without rales, wheezing or rhonchi  ABDOMEN: Soft, non-tender, non-distended MUSCULOSKELETAL:  No edema; No deformity  SKIN: Warm and dry NEUROLOGIC:  Alert and oriented x 3 PSYCHIATRIC:  Normal affect   Signed, Garwin Brothers, MD  09/03/2023 4:37 PM    [facility]

## 2023-09-03 NOTE — Patient Instructions (Signed)

## 2023-09-04 ENCOUNTER — Ambulatory Visit (HOSPITAL_COMMUNITY)
Admission: RE | Admit: 2023-09-04 | Discharge: 2023-09-04 | Disposition: A | Discharge status: Home / Self Care | Payer: Medicare HMO | Source: Ambulatory Visit | Attending: Surgery | Admitting: Surgery

## 2023-09-04 DIAGNOSIS — Z431 Encounter for attention to gastrostomy: Secondary | ICD-10-CM | POA: Insufficient documentation

## 2023-09-04 DIAGNOSIS — R1312 Dysphagia, oropharyngeal phase: Secondary | ICD-10-CM

## 2023-09-04 DIAGNOSIS — R1310 Dysphagia, unspecified: Secondary | ICD-10-CM | POA: Diagnosis not present

## 2023-09-04 HISTORY — PX: IR REPLC GASTRO/COLONIC TUBE PERCUT W/FLUORO: IMG2333

## 2023-09-04 MED ORDER — LIDOCAINE VISCOUS HCL 2 % MT SOLN
15.0000 mL | Freq: Once | OROMUCOSAL | Status: AC
  Administered 2023-09-04: 5 mL via TOPICAL

## 2023-09-04 MED ORDER — LIDOCAINE VISCOUS HCL 2 % MT SOLN
OROMUCOSAL | Status: AC
  Filled 2023-09-04: qty 15

## 2023-09-04 MED ORDER — IOHEXOL 300 MG/ML  SOLN
50.0000 mL | Freq: Once | INTRAMUSCULAR | Status: AC | PRN
  Administered 2023-09-04: 15 mL

## 2023-09-05 DIAGNOSIS — G250 Essential tremor: Secondary | ICD-10-CM | POA: Diagnosis not present

## 2023-09-05 DIAGNOSIS — M8580 Other specified disorders of bone density and structure, unspecified site: Secondary | ICD-10-CM | POA: Diagnosis not present

## 2023-09-05 DIAGNOSIS — Z431 Encounter for attention to gastrostomy: Secondary | ICD-10-CM | POA: Diagnosis not present

## 2023-09-05 DIAGNOSIS — H903 Sensorineural hearing loss, bilateral: Secondary | ICD-10-CM | POA: Diagnosis not present

## 2023-09-05 DIAGNOSIS — Z7982 Long term (current) use of aspirin: Secondary | ICD-10-CM | POA: Diagnosis not present

## 2023-09-05 DIAGNOSIS — Z8701 Personal history of pneumonia (recurrent): Secondary | ICD-10-CM | POA: Diagnosis not present

## 2023-09-05 DIAGNOSIS — J4489 Other specified chronic obstructive pulmonary disease: Secondary | ICD-10-CM | POA: Diagnosis not present

## 2023-09-05 DIAGNOSIS — E43 Unspecified severe protein-calorie malnutrition: Secondary | ICD-10-CM | POA: Diagnosis not present

## 2023-09-05 DIAGNOSIS — Z7951 Long term (current) use of inhaled steroids: Secondary | ICD-10-CM | POA: Diagnosis not present

## 2023-09-05 DIAGNOSIS — R911 Solitary pulmonary nodule: Secondary | ICD-10-CM | POA: Diagnosis not present

## 2023-09-05 DIAGNOSIS — K5900 Constipation, unspecified: Secondary | ICD-10-CM | POA: Diagnosis not present

## 2023-09-05 DIAGNOSIS — R1312 Dysphagia, oropharyngeal phase: Secondary | ICD-10-CM | POA: Diagnosis not present

## 2023-09-05 DIAGNOSIS — Q2549 Other congenital malformations of aorta: Secondary | ICD-10-CM | POA: Diagnosis not present

## 2023-09-05 DIAGNOSIS — K219 Gastro-esophageal reflux disease without esophagitis: Secondary | ICD-10-CM | POA: Diagnosis not present

## 2023-09-05 DIAGNOSIS — I129 Hypertensive chronic kidney disease with stage 1 through stage 4 chronic kidney disease, or unspecified chronic kidney disease: Secondary | ICD-10-CM | POA: Diagnosis not present

## 2023-09-05 DIAGNOSIS — E785 Hyperlipidemia, unspecified: Secondary | ICD-10-CM | POA: Diagnosis not present

## 2023-09-05 DIAGNOSIS — Z79899 Other long term (current) drug therapy: Secondary | ICD-10-CM | POA: Diagnosis not present

## 2023-09-05 DIAGNOSIS — I2510 Atherosclerotic heart disease of native coronary artery without angina pectoris: Secondary | ICD-10-CM | POA: Diagnosis not present

## 2023-09-05 DIAGNOSIS — Z955 Presence of coronary angioplasty implant and graft: Secondary | ICD-10-CM | POA: Diagnosis not present

## 2023-09-05 DIAGNOSIS — N189 Chronic kidney disease, unspecified: Secondary | ICD-10-CM | POA: Diagnosis not present

## 2023-09-05 DIAGNOSIS — M109 Gout, unspecified: Secondary | ICD-10-CM | POA: Diagnosis not present

## 2023-09-05 DIAGNOSIS — D631 Anemia in chronic kidney disease: Secondary | ICD-10-CM | POA: Diagnosis not present

## 2023-09-11 DIAGNOSIS — I129 Hypertensive chronic kidney disease with stage 1 through stage 4 chronic kidney disease, or unspecified chronic kidney disease: Secondary | ICD-10-CM | POA: Diagnosis not present

## 2023-09-11 DIAGNOSIS — K219 Gastro-esophageal reflux disease without esophagitis: Secondary | ICD-10-CM | POA: Diagnosis not present

## 2023-09-11 DIAGNOSIS — E785 Hyperlipidemia, unspecified: Secondary | ICD-10-CM | POA: Diagnosis not present

## 2023-09-11 DIAGNOSIS — Z7982 Long term (current) use of aspirin: Secondary | ICD-10-CM | POA: Diagnosis not present

## 2023-09-11 DIAGNOSIS — E43 Unspecified severe protein-calorie malnutrition: Secondary | ICD-10-CM | POA: Diagnosis not present

## 2023-09-11 DIAGNOSIS — G250 Essential tremor: Secondary | ICD-10-CM | POA: Diagnosis not present

## 2023-09-11 DIAGNOSIS — R1312 Dysphagia, oropharyngeal phase: Secondary | ICD-10-CM | POA: Diagnosis not present

## 2023-09-11 DIAGNOSIS — N189 Chronic kidney disease, unspecified: Secondary | ICD-10-CM | POA: Diagnosis not present

## 2023-09-11 DIAGNOSIS — J4489 Other specified chronic obstructive pulmonary disease: Secondary | ICD-10-CM | POA: Diagnosis not present

## 2023-09-11 DIAGNOSIS — Z7951 Long term (current) use of inhaled steroids: Secondary | ICD-10-CM | POA: Diagnosis not present

## 2023-09-11 DIAGNOSIS — D631 Anemia in chronic kidney disease: Secondary | ICD-10-CM | POA: Diagnosis not present

## 2023-09-11 DIAGNOSIS — K5900 Constipation, unspecified: Secondary | ICD-10-CM | POA: Diagnosis not present

## 2023-09-11 DIAGNOSIS — Q2549 Other congenital malformations of aorta: Secondary | ICD-10-CM | POA: Diagnosis not present

## 2023-09-11 DIAGNOSIS — Z431 Encounter for attention to gastrostomy: Secondary | ICD-10-CM | POA: Diagnosis not present

## 2023-09-11 DIAGNOSIS — Z79899 Other long term (current) drug therapy: Secondary | ICD-10-CM | POA: Diagnosis not present

## 2023-09-11 DIAGNOSIS — Z955 Presence of coronary angioplasty implant and graft: Secondary | ICD-10-CM | POA: Diagnosis not present

## 2023-09-11 DIAGNOSIS — I2510 Atherosclerotic heart disease of native coronary artery without angina pectoris: Secondary | ICD-10-CM | POA: Diagnosis not present

## 2023-09-11 DIAGNOSIS — R911 Solitary pulmonary nodule: Secondary | ICD-10-CM | POA: Diagnosis not present

## 2023-09-11 DIAGNOSIS — H903 Sensorineural hearing loss, bilateral: Secondary | ICD-10-CM | POA: Diagnosis not present

## 2023-09-11 DIAGNOSIS — Z8701 Personal history of pneumonia (recurrent): Secondary | ICD-10-CM | POA: Diagnosis not present

## 2023-09-11 DIAGNOSIS — M109 Gout, unspecified: Secondary | ICD-10-CM | POA: Diagnosis not present

## 2023-09-11 DIAGNOSIS — M8580 Other specified disorders of bone density and structure, unspecified site: Secondary | ICD-10-CM | POA: Diagnosis not present

## 2023-09-15 DIAGNOSIS — R1310 Dysphagia, unspecified: Secondary | ICD-10-CM | POA: Diagnosis not present

## 2023-09-15 DIAGNOSIS — R1314 Dysphagia, pharyngoesophageal phase: Secondary | ICD-10-CM | POA: Diagnosis not present

## 2023-09-15 DIAGNOSIS — R1312 Dysphagia, oropharyngeal phase: Secondary | ICD-10-CM | POA: Diagnosis not present

## 2023-09-15 DIAGNOSIS — J3801 Paralysis of vocal cords and larynx, unilateral: Secondary | ICD-10-CM | POA: Diagnosis not present

## 2023-09-16 DIAGNOSIS — I2510 Atherosclerotic heart disease of native coronary artery without angina pectoris: Secondary | ICD-10-CM | POA: Diagnosis not present

## 2023-09-16 DIAGNOSIS — Z7982 Long term (current) use of aspirin: Secondary | ICD-10-CM | POA: Diagnosis not present

## 2023-09-16 DIAGNOSIS — K219 Gastro-esophageal reflux disease without esophagitis: Secondary | ICD-10-CM | POA: Diagnosis not present

## 2023-09-16 DIAGNOSIS — M8580 Other specified disorders of bone density and structure, unspecified site: Secondary | ICD-10-CM | POA: Diagnosis not present

## 2023-09-16 DIAGNOSIS — Z8701 Personal history of pneumonia (recurrent): Secondary | ICD-10-CM | POA: Diagnosis not present

## 2023-09-16 DIAGNOSIS — Z955 Presence of coronary angioplasty implant and graft: Secondary | ICD-10-CM | POA: Diagnosis not present

## 2023-09-16 DIAGNOSIS — I129 Hypertensive chronic kidney disease with stage 1 through stage 4 chronic kidney disease, or unspecified chronic kidney disease: Secondary | ICD-10-CM | POA: Diagnosis not present

## 2023-09-16 DIAGNOSIS — Q2549 Other congenital malformations of aorta: Secondary | ICD-10-CM | POA: Diagnosis not present

## 2023-09-16 DIAGNOSIS — G250 Essential tremor: Secondary | ICD-10-CM | POA: Diagnosis not present

## 2023-09-16 DIAGNOSIS — E785 Hyperlipidemia, unspecified: Secondary | ICD-10-CM | POA: Diagnosis not present

## 2023-09-16 DIAGNOSIS — N189 Chronic kidney disease, unspecified: Secondary | ICD-10-CM | POA: Diagnosis not present

## 2023-09-16 DIAGNOSIS — R1310 Dysphagia, unspecified: Secondary | ICD-10-CM | POA: Diagnosis not present

## 2023-09-16 DIAGNOSIS — H903 Sensorineural hearing loss, bilateral: Secondary | ICD-10-CM | POA: Diagnosis not present

## 2023-09-16 DIAGNOSIS — E43 Unspecified severe protein-calorie malnutrition: Secondary | ICD-10-CM | POA: Diagnosis not present

## 2023-09-16 DIAGNOSIS — R1312 Dysphagia, oropharyngeal phase: Secondary | ICD-10-CM | POA: Diagnosis not present

## 2023-09-16 DIAGNOSIS — Z79899 Other long term (current) drug therapy: Secondary | ICD-10-CM | POA: Diagnosis not present

## 2023-09-16 DIAGNOSIS — Z431 Encounter for attention to gastrostomy: Secondary | ICD-10-CM | POA: Diagnosis not present

## 2023-09-16 DIAGNOSIS — K5900 Constipation, unspecified: Secondary | ICD-10-CM | POA: Diagnosis not present

## 2023-09-16 DIAGNOSIS — M109 Gout, unspecified: Secondary | ICD-10-CM | POA: Diagnosis not present

## 2023-09-16 DIAGNOSIS — R911 Solitary pulmonary nodule: Secondary | ICD-10-CM | POA: Diagnosis not present

## 2023-09-16 DIAGNOSIS — Z7951 Long term (current) use of inhaled steroids: Secondary | ICD-10-CM | POA: Diagnosis not present

## 2023-09-16 DIAGNOSIS — J4489 Other specified chronic obstructive pulmonary disease: Secondary | ICD-10-CM | POA: Diagnosis not present

## 2023-09-16 DIAGNOSIS — D631 Anemia in chronic kidney disease: Secondary | ICD-10-CM | POA: Diagnosis not present

## 2023-09-17 DIAGNOSIS — C61 Malignant neoplasm of prostate: Secondary | ICD-10-CM | POA: Diagnosis not present

## 2023-09-17 DIAGNOSIS — N3289 Other specified disorders of bladder: Secondary | ICD-10-CM | POA: Diagnosis not present

## 2023-09-26 DIAGNOSIS — Z431 Encounter for attention to gastrostomy: Secondary | ICD-10-CM | POA: Diagnosis not present

## 2023-09-26 DIAGNOSIS — E43 Unspecified severe protein-calorie malnutrition: Secondary | ICD-10-CM | POA: Diagnosis not present

## 2023-09-26 DIAGNOSIS — J4489 Other specified chronic obstructive pulmonary disease: Secondary | ICD-10-CM | POA: Diagnosis not present

## 2023-09-26 DIAGNOSIS — Z7951 Long term (current) use of inhaled steroids: Secondary | ICD-10-CM | POA: Diagnosis not present

## 2023-09-26 DIAGNOSIS — Z8701 Personal history of pneumonia (recurrent): Secondary | ICD-10-CM | POA: Diagnosis not present

## 2023-09-26 DIAGNOSIS — M8580 Other specified disorders of bone density and structure, unspecified site: Secondary | ICD-10-CM | POA: Diagnosis not present

## 2023-09-26 DIAGNOSIS — Q2549 Other congenital malformations of aorta: Secondary | ICD-10-CM | POA: Diagnosis not present

## 2023-09-26 DIAGNOSIS — Z955 Presence of coronary angioplasty implant and graft: Secondary | ICD-10-CM | POA: Diagnosis not present

## 2023-09-26 DIAGNOSIS — M109 Gout, unspecified: Secondary | ICD-10-CM | POA: Diagnosis not present

## 2023-09-26 DIAGNOSIS — I2510 Atherosclerotic heart disease of native coronary artery without angina pectoris: Secondary | ICD-10-CM | POA: Diagnosis not present

## 2023-09-26 DIAGNOSIS — G250 Essential tremor: Secondary | ICD-10-CM | POA: Diagnosis not present

## 2023-09-26 DIAGNOSIS — K219 Gastro-esophageal reflux disease without esophagitis: Secondary | ICD-10-CM | POA: Diagnosis not present

## 2023-09-26 DIAGNOSIS — Z7982 Long term (current) use of aspirin: Secondary | ICD-10-CM | POA: Diagnosis not present

## 2023-09-26 DIAGNOSIS — K5900 Constipation, unspecified: Secondary | ICD-10-CM | POA: Diagnosis not present

## 2023-09-26 DIAGNOSIS — D631 Anemia in chronic kidney disease: Secondary | ICD-10-CM | POA: Diagnosis not present

## 2023-09-26 DIAGNOSIS — R1312 Dysphagia, oropharyngeal phase: Secondary | ICD-10-CM | POA: Diagnosis not present

## 2023-09-26 DIAGNOSIS — Z79899 Other long term (current) drug therapy: Secondary | ICD-10-CM | POA: Diagnosis not present

## 2023-09-26 DIAGNOSIS — H903 Sensorineural hearing loss, bilateral: Secondary | ICD-10-CM | POA: Diagnosis not present

## 2023-09-26 DIAGNOSIS — N189 Chronic kidney disease, unspecified: Secondary | ICD-10-CM | POA: Diagnosis not present

## 2023-09-26 DIAGNOSIS — E785 Hyperlipidemia, unspecified: Secondary | ICD-10-CM | POA: Diagnosis not present

## 2023-09-26 DIAGNOSIS — I129 Hypertensive chronic kidney disease with stage 1 through stage 4 chronic kidney disease, or unspecified chronic kidney disease: Secondary | ICD-10-CM | POA: Diagnosis not present

## 2023-09-26 DIAGNOSIS — R911 Solitary pulmonary nodule: Secondary | ICD-10-CM | POA: Diagnosis not present

## 2023-09-29 DIAGNOSIS — Z4659 Encounter for fitting and adjustment of other gastrointestinal appliance and device: Secondary | ICD-10-CM | POA: Diagnosis not present

## 2023-09-29 DIAGNOSIS — R109 Unspecified abdominal pain: Secondary | ICD-10-CM | POA: Diagnosis not present

## 2023-09-29 DIAGNOSIS — K9423 Gastrostomy malfunction: Secondary | ICD-10-CM | POA: Diagnosis not present

## 2023-09-29 DIAGNOSIS — Z5329 Procedure and treatment not carried out because of patient's decision for other reasons: Secondary | ICD-10-CM | POA: Diagnosis not present

## 2023-09-29 DIAGNOSIS — K6389 Other specified diseases of intestine: Secondary | ICD-10-CM | POA: Diagnosis not present

## 2023-09-29 DIAGNOSIS — L03311 Cellulitis of abdominal wall: Secondary | ICD-10-CM | POA: Diagnosis not present

## 2023-09-30 ENCOUNTER — Emergency Department (HOSPITAL_COMMUNITY): Payer: Medicare HMO

## 2023-09-30 ENCOUNTER — Encounter (HOSPITAL_COMMUNITY): Payer: Self-pay | Admitting: Emergency Medicine

## 2023-09-30 ENCOUNTER — Other Ambulatory Visit (HOSPITAL_COMMUNITY): Payer: Self-pay | Admitting: Interventional Radiology

## 2023-09-30 ENCOUNTER — Inpatient Hospital Stay (HOSPITAL_COMMUNITY)
Admission: EM | Admit: 2023-09-30 | Discharge: 2023-10-04 | DRG: 393 | Disposition: A | Discharge status: Home Health | Payer: Medicare HMO | Attending: Internal Medicine | Admitting: Internal Medicine

## 2023-09-30 ENCOUNTER — Other Ambulatory Visit: Payer: Self-pay

## 2023-09-30 DIAGNOSIS — R1312 Dysphagia, oropharyngeal phase: Secondary | ICD-10-CM

## 2023-09-30 DIAGNOSIS — Z5329 Procedure and treatment not carried out because of patient's decision for other reasons: Secondary | ICD-10-CM | POA: Diagnosis not present

## 2023-09-30 DIAGNOSIS — T85848A Pain due to other internal prosthetic devices, implants and grafts, initial encounter: Secondary | ICD-10-CM

## 2023-09-30 DIAGNOSIS — Z66 Do not resuscitate: Secondary | ICD-10-CM | POA: Diagnosis present

## 2023-09-30 DIAGNOSIS — J9811 Atelectasis: Secondary | ICD-10-CM | POA: Diagnosis not present

## 2023-09-30 DIAGNOSIS — Z681 Body mass index (BMI) 19 or less, adult: Secondary | ICD-10-CM

## 2023-09-30 DIAGNOSIS — R0600 Dyspnea, unspecified: Secondary | ICD-10-CM | POA: Diagnosis not present

## 2023-09-30 DIAGNOSIS — Y838 Other surgical procedures as the cause of abnormal reaction of the patient, or of later complication, without mention of misadventure at the time of the procedure: Secondary | ICD-10-CM | POA: Diagnosis present

## 2023-09-30 DIAGNOSIS — Z888 Allergy status to other drugs, medicaments and biological substances status: Secondary | ICD-10-CM

## 2023-09-30 DIAGNOSIS — L03311 Cellulitis of abdominal wall: Secondary | ICD-10-CM | POA: Diagnosis not present

## 2023-09-30 DIAGNOSIS — K922 Gastrointestinal hemorrhage, unspecified: Secondary | ICD-10-CM | POA: Diagnosis present

## 2023-09-30 DIAGNOSIS — J9601 Acute respiratory failure with hypoxia: Secondary | ICD-10-CM | POA: Diagnosis present

## 2023-09-30 DIAGNOSIS — Z79899 Other long term (current) drug therapy: Secondary | ICD-10-CM

## 2023-09-30 DIAGNOSIS — H903 Sensorineural hearing loss, bilateral: Secondary | ICD-10-CM | POA: Diagnosis present

## 2023-09-30 DIAGNOSIS — D631 Anemia in chronic kidney disease: Secondary | ICD-10-CM | POA: Diagnosis present

## 2023-09-30 DIAGNOSIS — J45991 Cough variant asthma: Secondary | ICD-10-CM | POA: Diagnosis present

## 2023-09-30 DIAGNOSIS — Z7982 Long term (current) use of aspirin: Secondary | ICD-10-CM

## 2023-09-30 DIAGNOSIS — Z833 Family history of diabetes mellitus: Secondary | ICD-10-CM

## 2023-09-30 DIAGNOSIS — R296 Repeated falls: Secondary | ICD-10-CM | POA: Diagnosis present

## 2023-09-30 DIAGNOSIS — M109 Gout, unspecified: Secondary | ICD-10-CM | POA: Diagnosis present

## 2023-09-30 DIAGNOSIS — Z8249 Family history of ischemic heart disease and other diseases of the circulatory system: Secondary | ICD-10-CM

## 2023-09-30 DIAGNOSIS — E43 Unspecified severe protein-calorie malnutrition: Secondary | ICD-10-CM | POA: Diagnosis present

## 2023-09-30 DIAGNOSIS — Z885 Allergy status to narcotic agent status: Secondary | ICD-10-CM

## 2023-09-30 DIAGNOSIS — I10 Essential (primary) hypertension: Secondary | ICD-10-CM | POA: Diagnosis present

## 2023-09-30 DIAGNOSIS — T17908A Unspecified foreign body in respiratory tract, part unspecified causing other injury, initial encounter: Secondary | ICD-10-CM | POA: Diagnosis present

## 2023-09-30 DIAGNOSIS — Z955 Presence of coronary angioplasty implant and graft: Secondary | ICD-10-CM

## 2023-09-30 DIAGNOSIS — E785 Hyperlipidemia, unspecified: Secondary | ICD-10-CM | POA: Diagnosis present

## 2023-09-30 DIAGNOSIS — I651 Occlusion and stenosis of basilar artery: Secondary | ICD-10-CM | POA: Diagnosis present

## 2023-09-30 DIAGNOSIS — Z800 Family history of malignant neoplasm of digestive organs: Secondary | ICD-10-CM

## 2023-09-30 DIAGNOSIS — R64 Cachexia: Secondary | ICD-10-CM | POA: Diagnosis present

## 2023-09-30 DIAGNOSIS — R109 Unspecified abdominal pain: Secondary | ICD-10-CM | POA: Diagnosis not present

## 2023-09-30 DIAGNOSIS — Z808 Family history of malignant neoplasm of other organs or systems: Secondary | ICD-10-CM

## 2023-09-30 DIAGNOSIS — Z9842 Cataract extraction status, left eye: Secondary | ICD-10-CM

## 2023-09-30 DIAGNOSIS — Z7951 Long term (current) use of inhaled steroids: Secondary | ICD-10-CM

## 2023-09-30 DIAGNOSIS — J690 Pneumonitis due to inhalation of food and vomit: Secondary | ICD-10-CM | POA: Diagnosis present

## 2023-09-30 DIAGNOSIS — K9423 Gastrostomy malfunction: Secondary | ICD-10-CM | POA: Diagnosis not present

## 2023-09-30 DIAGNOSIS — Z880 Allergy status to penicillin: Secondary | ICD-10-CM

## 2023-09-30 DIAGNOSIS — Z8546 Personal history of malignant neoplasm of prostate: Secondary | ICD-10-CM

## 2023-09-30 DIAGNOSIS — K6389 Other specified diseases of intestine: Secondary | ICD-10-CM | POA: Diagnosis not present

## 2023-09-30 DIAGNOSIS — E876 Hypokalemia: Secondary | ICD-10-CM | POA: Diagnosis present

## 2023-09-30 DIAGNOSIS — Z87891 Personal history of nicotine dependence: Secondary | ICD-10-CM

## 2023-09-30 DIAGNOSIS — K5730 Diverticulosis of large intestine without perforation or abscess without bleeding: Secondary | ICD-10-CM | POA: Diagnosis present

## 2023-09-30 DIAGNOSIS — Z825 Family history of asthma and other chronic lower respiratory diseases: Secondary | ICD-10-CM

## 2023-09-30 DIAGNOSIS — R1310 Dysphagia, unspecified: Secondary | ICD-10-CM

## 2023-09-30 DIAGNOSIS — I4510 Unspecified right bundle-branch block: Secondary | ICD-10-CM | POA: Diagnosis present

## 2023-09-30 DIAGNOSIS — Z9841 Cataract extraction status, right eye: Secondary | ICD-10-CM

## 2023-09-30 DIAGNOSIS — Z4659 Encounter for fitting and adjustment of other gastrointestinal appliance and device: Secondary | ICD-10-CM | POA: Diagnosis not present

## 2023-09-30 DIAGNOSIS — R1313 Dysphagia, pharyngeal phase: Secondary | ICD-10-CM | POA: Diagnosis present

## 2023-09-30 DIAGNOSIS — S2243XA Multiple fractures of ribs, bilateral, initial encounter for closed fracture: Secondary | ICD-10-CM | POA: Diagnosis not present

## 2023-09-30 DIAGNOSIS — I2510 Atherosclerotic heart disease of native coronary artery without angina pectoris: Secondary | ICD-10-CM | POA: Diagnosis present

## 2023-09-30 DIAGNOSIS — Q2548 Anomalous origin of subclavian artery: Secondary | ICD-10-CM

## 2023-09-30 DIAGNOSIS — R9431 Abnormal electrocardiogram [ECG] [EKG]: Secondary | ICD-10-CM | POA: Diagnosis not present

## 2023-09-30 DIAGNOSIS — J4489 Other specified chronic obstructive pulmonary disease: Secondary | ICD-10-CM | POA: Diagnosis present

## 2023-09-30 LAB — COMPREHENSIVE METABOLIC PANEL
ALT: 20 U/L (ref 0–44)
AST: 27 U/L (ref 15–41)
Albumin: 3.8 g/dL (ref 3.5–5.0)
Alkaline Phosphatase: 45 U/L (ref 38–126)
Anion gap: 12 (ref 5–15)
BUN: 23 mg/dL (ref 8–23)
CO2: 23 mmol/L (ref 22–32)
Calcium: 9.6 mg/dL (ref 8.9–10.3)
Chloride: 101 mmol/L (ref 98–111)
Creatinine, Ser: 0.8 mg/dL (ref 0.61–1.24)
GFR, Estimated: >60 mL/min (ref >60)
Glucose, Bld: 109 mg/dL — ABNORMAL HIGH (ref 70–99)
Potassium: 4.1 mmol/L (ref 3.5–5.1)
Sodium: 136 mmol/L (ref 135–145)
Total Bilirubin: 0.8 mg/dL (ref <1.2)
Total Protein: 7.2 g/dL (ref 6.5–8.1)

## 2023-09-30 LAB — CBC
HCT: 35 % — ABNORMAL LOW (ref 39.0–52.0)
Hemoglobin: 11.4 g/dL — ABNORMAL LOW (ref 13.0–17.0)
MCH: 31.8 pg (ref 26.0–34.0)
MCHC: 32.6 g/dL (ref 30.0–36.0)
MCV: 97.8 fL (ref 80.0–100.0)
Platelets: 201 10*3/uL (ref 150–400)
RBC: 3.58 MIL/uL — ABNORMAL LOW (ref 4.22–5.81)
RDW: 14.1 % (ref 11.5–15.5)
WBC: 9.2 10*3/uL (ref 4.0–10.5)
nRBC: 0 % (ref 0.0–0.2)

## 2023-09-30 LAB — LIPASE, BLOOD: Lipase: 29 U/L (ref 11–51)

## 2023-09-30 MED ORDER — SODIUM CHLORIDE 0.9 % IV BOLUS
500.0000 mL | Freq: Once | INTRAVENOUS | Status: AC
  Administered 2023-09-30: 500 mL via INTRAVENOUS

## 2023-09-30 MED ORDER — FENTANYL CITRATE PF 50 MCG/ML IJ SOSY
50.0000 ug | PREFILLED_SYRINGE | Freq: Once | INTRAMUSCULAR | Status: AC
  Administered 2023-10-01: 50 ug via INTRAVENOUS
  Filled 2023-09-30: qty 1

## 2023-09-30 MED ORDER — IOHEXOL 350 MG/ML SOLN
75.0000 mL | Freq: Once | INTRAVENOUS | Status: AC | PRN
  Administered 2023-09-30: 75 mL via INTRAVENOUS

## 2023-09-30 MED ORDER — ONDANSETRON HCL 4 MG/2ML IJ SOLN
4.0000 mg | Freq: Once | INTRAMUSCULAR | Status: AC
  Administered 2023-10-01: 4 mg via INTRAVENOUS
  Filled 2023-09-30: qty 2

## 2023-09-30 NOTE — ED Provider Notes (Signed)
[place] EMERGENCY DEPARTMENT AT [facility] Provider Note   CSN: [id number] Arrival date & time: 09/30/23  1521    History  Chief Complaint  Patient presents with   Gastric Tube Problem    Brian Dunn is a 82 y.o. male pharyngeal dysphagia, s/p PEG tube, anemia, recurrent aspiration pneumonia, aneurysm, recurrent falls here for evaluation of nausea, vomiting and abd pain. Had a fall on Sunday. Seen at [facility] today due to Peg tube dysfunction, not able to run feeds. Had Cts can performed was called today due to PEG tube dysfunction, showing that the PEG tube was in the proximal duodenum.  Since yesterday on the CT scans performed the family tried running in Osmolite solution however was unable to since then he has been coughing and spitting up green and "dark and black" sputum he feels like his tube is refluxing back up into his chest.  He feels like he aspirated.  Has had some increased shortness of breath since.  He now has severe abdominal pain which is changed since his CT scan yesterday.  Family is also noted dark brown, coffee-ground substance oozing around and through his G-tube site.  He is exquisitely tender to this area. No fever, additional falls since initial evaluation.  Last time he was able to tolerate anything in peg tube was Sunday  Peg tube placed 03/19/23 by Dr. Bedelia Person  HPI     Home Medications Prior to Admission medications   Medication Sig Start Date End Date Taking? Authorizing Provider  allopurinol (ZYLOPRIM) 100 MG tablet Place 100 mg into feeding tube daily. 09/13/13   [provider]  aspirin 325 MG tablet Crush1 tablet (325 mg total) and place into feeding tube daily. 03/25/23   Rhyne, Ames Coupe, PA-C  budesonide-formoterol (SYMBICORT) 160-4.5 MCG/ACT inhaler Inhale 2 puffs into the lungs daily.    [provider]  docusate (COLACE) 50 MG/5ML liquid Place 10 mLs (100 mg total) into feeding tube daily. 03/25/23   Rhyne,  Ames Coupe, PA-C  famotidine (PEPCID) 40 MG tablet Place 1 tablet (40 mg total) into feeding tube daily. 03/25/23   Rhyne, Ames Coupe, PA-C  finasteride (PROSCAR) 5 MG tablet Take 5 mg by mouth daily. 08/19/23   [provider]  fluticasone (FLONASE) 50 MCG/ACT nasal spray Place 2 sprays into the nose daily. 12/01/10   [provider]  food thickener (SIMPLYTHICK, NECTAR/LEVEL 2/MILDLY THICK,) GEL Take 1 packet by mouth as needed. 03/24/23   Rhyne, Ames Coupe, PA-C  loratadine (CLARITIN) 10 MG tablet Place 1 tablet (10 mg total) into feeding tube daily. 03/24/23   Rhyne, Ames Coupe, PA-C  Multiple Vitamin (MULTIVITAMIN) capsule Place 1 capsule into feeding tube daily. 03/24/23   Rhyne, Ames Coupe, PA-C  nitroGLYCERIN (NITROSTAT) 0.4 MG SL tablet Place 1 tablet (0.4 mg total) under the tongue every 5 (five) minutes as needed for chest pain. 01/29/22   Revankar, Aundra Dubin, MD  omeprazole (PRILOSEC) 40 MG capsule Take 40 mg by mouth every morning. 04/24/23   [provider]  pravastatin (PRAVACHOL) 40 MG tablet Take 1 tablet (40 mg total) by mouth daily. 04/01/23   Revankar, Aundra Dubin, MD  primidone (MYSOLINE) 50 MG tablet Place 1 tablet (50 mg total) into feeding tube at bedtime. 03/24/23   Rhyne, Ames Coupe, PA-C  senna-docusate (SENOKOT-S) 8.6-50 MG tablet Place 1 tablet into feeding tube at bedtime as needed for mild constipation. 03/24/23   Rhyne, Ames Coupe, PA-C  traMADol (ULTRAM) 50 MG  tablet Place 1 tablet (50 mg total) into feeding tube every 6 (six) hours as needed for moderate pain. 03/24/23   Rhyne, Ames Coupe, PA-C      Allergies    Metoprolol, Penicillins, and Morphine    Review of Systems   Review of Systems  Constitutional:  Positive for fatigue.  HENT: Negative.    Respiratory:  Positive for cough and shortness of breath. Negative for chest tightness, wheezing and stridor.   Cardiovascular: Negative.   Gastrointestinal:  Positive for abdominal pain, diarrhea, nausea  and vomiting. Negative for abdominal distention, anal bleeding, blood in stool, constipation and rectal pain.  Genitourinary: Negative.   Musculoskeletal: Negative.   Skin: Negative.   Neurological:  Positive for weakness (generalized).  All other systems reviewed and are negative.  Physical Exam Updated Vital Signs BP (!) 169/80 (BP Location: Right Arm)   Pulse 87   Temp 98.4 F (36.9 C) (Oral)   Resp 17   SpO2 100%  Physical Exam Vitals and nursing note reviewed.  Constitutional:      General: He is not in acute distress.    Appearance: He is well-developed. He is ill-appearing. He is not toxic-appearing or diaphoretic.     Comments: Chronically ill-appearing  HENT:     Head: Atraumatic.     Mouth/Throat:     Mouth: Mucous membranes are dry.  Eyes:     Pupils: Pupils are equal, round, and reactive to light.  Cardiovascular:     Rate and Rhythm: Normal rate and regular rhythm.     Pulses: Normal pulses.     Heart sounds: Normal heart sounds.  Pulmonary:     Effort: Pulmonary effort is normal. No respiratory distress.     Breath sounds: Rhonchi present.     Comments: Coarse rhonchi bilateral lobes right greater than left Abdominal:     General: Bowel sounds are normal. There is no distension.     Palpations: Abdomen is soft.     Tenderness: There is abdominal tenderness. There is guarding. There is no right CVA tenderness, left CVA tenderness or rebound.     Comments: G-tube to lower abdomen with dark blood surrounding site and dark blood from G-tube  Musculoskeletal:        General: No swelling, tenderness, deformity or signs of injury. Normal range of motion.     Cervical back: Normal range of motion and neck supple.     Right lower leg: No edema.     Left lower leg: No edema.  Skin:    General: Skin is warm and dry.     Capillary Refill: Capillary refill takes less than 2 seconds.  Neurological:     General: No focal deficit present.     Mental Status: He is alert  and oriented to person, place, and time.    ED Results / Procedures / Treatments   Labs (all labs ordered are listed, but only abnormal results are displayed) Labs Reviewed  COMPREHENSIVE METABOLIC PANEL - Abnormal; Notable for the following components:      Result Value   Glucose, Bld 109 (*)    All other components within normal limits  CBC - Abnormal; Notable for the following components:   RBC 3.58 (*)    Hemoglobin 11.4 (*)    HCT 35.0 (*)    All other components within normal limits  LIPASE, BLOOD  URINALYSIS, ROUTINE W REFLEX MICROSCOPIC  OCCULT BLOOD GASTRIC / DUODENUM (SPECIMEN CUP)  BRAIN NATRIURETIC PEPTIDE  I-STAT  CG4 LACTIC ACID, ED  TROPONIN I (HIGH SENSITIVITY)    EKG EKG Interpretation Date/Time:  Tuesday September 30 2023 20:31:05 EST Ventricular Rate:  89 PR Interval:  142 QRS Duration:  100 QT Interval:  380 QTC Calculation: 462 R Axis:   -33  Text Interpretation: Sinus rhythm with Premature atrial complexes Left axis deviation Confirmed by Cathren Laine ([id number]) on 09/30/2023 9:25:30 PM  Radiology DG Chest 2 View  Result Date: 09/30/2023 CLINICAL DATA:  Dyspnea EXAM: CHEST - 2 VIEW COMPARISON:  06/26/2023 FINDINGS: Discoid atelectasis right base. Lungs are otherwise clear. No pneumothorax or pleural effusion. Cardiac size within limits. The stent graft noted within the aortic arch unchanged. Healed bilateral rib fractures noted. No acute bone. IMPRESSION: 1. Right basilar discoid atelectasis. Electronically Signed   By: Helyn Numbers M.D.   On: 09/30/2023 21:48    Procedures Procedures    Medications Ordered in ED Medications  ondansetron (ZOFRAN) injection 4 mg (has no administration in time range)  fentaNYL (SUBLIMAZE) injection 50 mcg (has no administration in time range)  sodium chloride 0.9 % bolus 500 mL (0 mLs Intravenous Stopped 09/30/23 2307)  iohexol (OMNIPAQUE) 350 MG/ML injection 75 mL (75 mLs Intravenous Contrast Given 09/30/23 2254)    ED Course/ Medical Decision Making/ A&P   82 year old here for evaluation of abdominal pain.  Had a mechanical fall on Sunday.  Seen in [place] for imaging yesterday was called today and told that his G-tube was dislodged.  Since then he has had severe, worsening pain to his abdomen, feels like he is refluxing liquids from his tube up into his chest and coughing them up.  He noted since this began he developed a cough and feels like he has aspiration pneumonia which she has had previously.  He also noted dark, tarry substance surrounding his PEG tube as well as his output from the tube has been dark bloody.  He denies any recent falls since Sunday.  We did send PEG tube output to lab however they stated they were not able to "run the specimen" as they did not have the cards for it.  I did at bedside put it on an occult card which was positive for blood.  His heart is clear.  His lungs to talk he has coarse rhonchi to his lower lobes, right greater than left.  His abdomen is exquisitely tender however no rebound.  Will plan on labs, imaging, reassess  Labs and imaging personally viewed and interpreted:  CBC without leukocytosis, hemoglobin 11.4 Metabolic panel glucose 109 Lipase 29 Chest x-ray with right atelectasis EKG without ischemic changes  Difficulty with IV access.  Attempt x 2 by myself as well as multiple nurses.  Will put in consult for IV team.  He is requesting pain medicine.  IV access obtained. Pending CT AP  Patient reassessed. Diaphoretic, states he has CP, feels SOB. Course lung sounds BIL. Charge nurse notified. Additional orders placed. Patient currently in a hallway bed. Charge notified that patient needs room and monitor. Pending imaging  Care transferred to oncoming provider who will FU on remaining labs, imaging and dispo.                                Medical Decision Making Amount and/or Complexity of Data Reviewed Independent Historian: spouse External Data  Reviewed: labs, radiology and notes. Labs: ordered. Decision-making details documented in ED Course. Radiology: ordered and independent interpretation performed.  Decision-making details documented in ED Course.  Risk OTC drugs. Prescription drug management. Parenteral controlled substances. Decision regarding hospitalization. Diagnosis or treatment significantly limited by social determinants of health.         Final Clinical Impression(s) / ED Diagnoses Final diagnoses:  PEG tube malfunction (HCC)  Pain around percutaneous endoscopic gastrostomy (PEG) tube site, initial encounter    Rx / DC Orders ED Discharge Orders     None         Noboru Bidinger A, PA-C 09/30/23 2340    Cathren Laine, MD 10/07/23 [phone]

## 2023-09-30 NOTE — ED Triage Notes (Signed)
Pt reports abdominal pain. Pt reports he was seen yesterday and was told his G-tube is in the duodenum and not the stomach. Pt was instructed to come to [facility] due to having the tube placed at [facility].

## 2023-09-30 NOTE — ED Notes (Signed)
Patient to xray.

## 2023-10-01 ENCOUNTER — Encounter (HOSPITAL_COMMUNITY): Payer: Self-pay | Admitting: Family Medicine

## 2023-10-01 ENCOUNTER — Inpatient Hospital Stay (HOSPITAL_COMMUNITY): Payer: Medicare HMO

## 2023-10-01 ENCOUNTER — Emergency Department (HOSPITAL_COMMUNITY): Payer: Medicare HMO

## 2023-10-01 DIAGNOSIS — R1319 Other dysphagia: Secondary | ICD-10-CM | POA: Diagnosis not present

## 2023-10-01 DIAGNOSIS — J9601 Acute respiratory failure with hypoxia: Secondary | ICD-10-CM | POA: Diagnosis present

## 2023-10-01 DIAGNOSIS — I651 Occlusion and stenosis of basilar artery: Secondary | ICD-10-CM | POA: Diagnosis not present

## 2023-10-01 DIAGNOSIS — K922 Gastrointestinal hemorrhage, unspecified: Secondary | ICD-10-CM

## 2023-10-01 DIAGNOSIS — J9811 Atelectasis: Secondary | ICD-10-CM | POA: Diagnosis not present

## 2023-10-01 DIAGNOSIS — I10 Essential (primary) hypertension: Secondary | ICD-10-CM

## 2023-10-01 DIAGNOSIS — T17908A Unspecified foreign body in respiratory tract, part unspecified causing other injury, initial encounter: Secondary | ICD-10-CM | POA: Diagnosis present

## 2023-10-01 DIAGNOSIS — Z8546 Personal history of malignant neoplasm of prostate: Secondary | ICD-10-CM | POA: Diagnosis not present

## 2023-10-01 DIAGNOSIS — E876 Hypokalemia: Secondary | ICD-10-CM | POA: Diagnosis not present

## 2023-10-01 DIAGNOSIS — Z9842 Cataract extraction status, left eye: Secondary | ICD-10-CM | POA: Diagnosis not present

## 2023-10-01 DIAGNOSIS — Z87891 Personal history of nicotine dependence: Secondary | ICD-10-CM | POA: Diagnosis not present

## 2023-10-01 DIAGNOSIS — I2510 Atherosclerotic heart disease of native coronary artery without angina pectoris: Secondary | ICD-10-CM | POA: Diagnosis not present

## 2023-10-01 DIAGNOSIS — R64 Cachexia: Secondary | ICD-10-CM | POA: Diagnosis not present

## 2023-10-01 DIAGNOSIS — E43 Unspecified severe protein-calorie malnutrition: Secondary | ICD-10-CM | POA: Diagnosis not present

## 2023-10-01 DIAGNOSIS — M109 Gout, unspecified: Secondary | ICD-10-CM | POA: Diagnosis not present

## 2023-10-01 DIAGNOSIS — D631 Anemia in chronic kidney disease: Secondary | ICD-10-CM | POA: Diagnosis not present

## 2023-10-01 DIAGNOSIS — H903 Sensorineural hearing loss, bilateral: Secondary | ICD-10-CM | POA: Diagnosis not present

## 2023-10-01 DIAGNOSIS — J45991 Cough variant asthma: Secondary | ICD-10-CM | POA: Diagnosis not present

## 2023-10-01 DIAGNOSIS — Z66 Do not resuscitate: Secondary | ICD-10-CM | POA: Diagnosis not present

## 2023-10-01 DIAGNOSIS — Z931 Gastrostomy status: Secondary | ICD-10-CM | POA: Diagnosis not present

## 2023-10-01 DIAGNOSIS — Q2548 Anomalous origin of subclavian artery: Secondary | ICD-10-CM | POA: Diagnosis not present

## 2023-10-01 DIAGNOSIS — R918 Other nonspecific abnormal finding of lung field: Secondary | ICD-10-CM | POA: Diagnosis not present

## 2023-10-01 DIAGNOSIS — Y838 Other surgical procedures as the cause of abnormal reaction of the patient, or of later complication, without mention of misadventure at the time of the procedure: Secondary | ICD-10-CM | POA: Diagnosis not present

## 2023-10-01 DIAGNOSIS — R1313 Dysphagia, pharyngeal phase: Secondary | ICD-10-CM | POA: Diagnosis not present

## 2023-10-01 DIAGNOSIS — Z955 Presence of coronary angioplasty implant and graft: Secondary | ICD-10-CM | POA: Diagnosis not present

## 2023-10-01 DIAGNOSIS — E785 Hyperlipidemia, unspecified: Secondary | ICD-10-CM | POA: Diagnosis not present

## 2023-10-01 DIAGNOSIS — K9423 Gastrostomy malfunction: Secondary | ICD-10-CM | POA: Diagnosis not present

## 2023-10-01 DIAGNOSIS — Z431 Encounter for attention to gastrostomy: Secondary | ICD-10-CM | POA: Diagnosis not present

## 2023-10-01 DIAGNOSIS — J4489 Other specified chronic obstructive pulmonary disease: Secondary | ICD-10-CM | POA: Diagnosis not present

## 2023-10-01 DIAGNOSIS — J690 Pneumonitis due to inhalation of food and vomit: Secondary | ICD-10-CM | POA: Diagnosis not present

## 2023-10-01 DIAGNOSIS — Z9841 Cataract extraction status, right eye: Secondary | ICD-10-CM | POA: Diagnosis not present

## 2023-10-01 DIAGNOSIS — Z681 Body mass index (BMI) 19 or less, adult: Secondary | ICD-10-CM | POA: Diagnosis not present

## 2023-10-01 HISTORY — DX: Unspecified foreign body in respiratory tract, part unspecified causing other injury, initial encounter: T17.908A

## 2023-10-01 HISTORY — PX: IR REPLC GASTRO/COLONIC TUBE PERCUT W/FLUORO: IMG2333

## 2023-10-01 HISTORY — DX: Gastrointestinal hemorrhage, unspecified: K92.2

## 2023-10-01 HISTORY — DX: Acute respiratory failure with hypoxia: J96.01

## 2023-10-01 LAB — CBC
HCT: 36.6 % — ABNORMAL LOW (ref 39.0–52.0)
HCT: 36.2 % — ABNORMAL LOW (ref 39.0–52.0)
HCT: 30.9 % — ABNORMAL LOW (ref 39.0–52.0)
Hemoglobin: 12.4 g/dL — ABNORMAL LOW (ref 13.0–17.0)
Hemoglobin: 12.2 g/dL — ABNORMAL LOW (ref 13.0–17.0)
Hemoglobin: 10.2 g/dL — ABNORMAL LOW (ref 13.0–17.0)
MCH: 32.5 pg (ref 26.0–34.0)
MCH: 33.2 pg (ref 26.0–34.0)
MCH: 32 pg (ref 26.0–34.0)
MCHC: 33.9 g/dL (ref 30.0–36.0)
MCHC: 33.7 g/dL (ref 30.0–36.0)
MCHC: 33 g/dL (ref 30.0–36.0)
MCV: 96.1 fL (ref 80.0–100.0)
MCV: 98.4 fL (ref 80.0–100.0)
MCV: 96.9 fL (ref 80.0–100.0)
Platelets: 186 10*3/uL (ref 150–400)
Platelets: 194 10*3/uL (ref 150–400)
Platelets: 169 10*3/uL (ref 150–400)
RBC: 3.81 MIL/uL — ABNORMAL LOW (ref 4.22–5.81)
RBC: 3.68 MIL/uL — ABNORMAL LOW (ref 4.22–5.81)
RBC: 3.19 MIL/uL — ABNORMAL LOW (ref 4.22–5.81)
RDW: 14.3 % (ref 11.5–15.5)
RDW: 14.4 % (ref 11.5–15.5)
RDW: 14.3 % (ref 11.5–15.5)
WBC: 10.8 10*3/uL — ABNORMAL HIGH (ref 4.0–10.5)
WBC: 14.5 10*3/uL — ABNORMAL HIGH (ref 4.0–10.5)
WBC: 12 10*3/uL — ABNORMAL HIGH (ref 4.0–10.5)
nRBC: 0 % (ref 0.0–0.2)
nRBC: 0 % (ref 0.0–0.2)
nRBC: 0 % (ref 0.0–0.2)

## 2023-10-01 LAB — URINALYSIS, ROUTINE W REFLEX MICROSCOPIC
Bilirubin Urine: NEGATIVE
Glucose, UA: NEGATIVE mg/dL
Hgb urine dipstick: NEGATIVE
Ketones, ur: 20 mg/dL — AB
Leukocytes,Ua: NEGATIVE
Nitrite: NEGATIVE
Protein, ur: 30 mg/dL — AB
Specific Gravity, Urine: 1.032 — ABNORMAL HIGH (ref 1.005–1.030)
pH: 7 (ref 5.0–8.0)

## 2023-10-01 LAB — BASIC METABOLIC PANEL
Anion gap: 15 (ref 5–15)
BUN: 29 mg/dL — ABNORMAL HIGH (ref 8–23)
CO2: 24 mmol/L (ref 22–32)
Calcium: 9.5 mg/dL (ref 8.9–10.3)
Chloride: 102 mmol/L (ref 98–111)
Creatinine, Ser: 0.85 mg/dL (ref 0.61–1.24)
GFR, Estimated: >60 mL/min (ref >60)
Glucose, Bld: 127 mg/dL — ABNORMAL HIGH (ref 70–99)
Potassium: 4.1 mmol/L (ref 3.5–5.1)
Sodium: 141 mmol/L (ref 135–145)

## 2023-10-01 LAB — TROPONIN I (HIGH SENSITIVITY)
Troponin I (High Sensitivity): 12 ng/L (ref <18)
Troponin I (High Sensitivity): 15 ng/L (ref <18)

## 2023-10-01 LAB — GLUCOSE, CAPILLARY
Glucose-Capillary: 103 mg/dL — ABNORMAL HIGH (ref 70–99)
Glucose-Capillary: 123 mg/dL — ABNORMAL HIGH (ref 70–99)

## 2023-10-01 LAB — BRAIN NATRIURETIC PEPTIDE: B Natriuretic Peptide: 116.2 pg/mL — ABNORMAL HIGH (ref 0.0–100.0)

## 2023-10-01 LAB — PHOSPHORUS: Phosphorus: 2.8 mg/dL (ref 2.5–4.6)

## 2023-10-01 LAB — TYPE AND SCREEN
ABO/RH(D): O POS
Antibody Screen: NEGATIVE

## 2023-10-01 LAB — MAGNESIUM: Magnesium: 1.9 mg/dL (ref 1.7–2.4)

## 2023-10-01 LAB — I-STAT CG4 LACTIC ACID, ED: Lactic Acid, Venous: 1.1 mmol/L (ref 0.5–1.9)

## 2023-10-01 MED ORDER — PANTOPRAZOLE INFUSION (NEW) - SIMPLE MED
8.0000 mg/h | INTRAVENOUS | Status: DC
  Filled 2023-10-01: qty 100

## 2023-10-01 MED ORDER — FINASTERIDE 5 MG PO TABS
5.0000 mg | ORAL_TABLET | Freq: Every day | ORAL | Status: DC
  Administered 2023-10-01 – 2023-10-03 (×3): 5 mg via ORAL
  Filled 2023-10-01 (×4): qty 1

## 2023-10-01 MED ORDER — IOHEXOL 300 MG/ML  SOLN
50.0000 mL | Freq: Once | INTRAMUSCULAR | Status: AC | PRN
  Administered 2023-10-01: 5 mL

## 2023-10-01 MED ORDER — ACETAMINOPHEN 650 MG RE SUPP: 650.0000 mg | Freq: Four times a day (QID) | RECTAL | Status: DC | PRN

## 2023-10-01 MED ORDER — LIDOCAINE VISCOUS HCL 2 % MT SOLN
OROMUCOSAL | Status: AC
  Filled 2023-10-01: qty 15

## 2023-10-01 MED ORDER — ACETAMINOPHEN 325 MG PO TABS: 650.0000 mg | ORAL_TABLET | Freq: Four times a day (QID) | ORAL | Status: DC | PRN

## 2023-10-01 MED ORDER — PANTOPRAZOLE SODIUM 40 MG IV SOLR: 40.0000 mg | Freq: Two times a day (BID) | INTRAVENOUS | Status: DC

## 2023-10-01 MED ORDER — MIRTAZAPINE 15 MG PO TABS
15.0000 mg | ORAL_TABLET | Freq: Every day | ORAL | Status: DC
  Administered 2023-10-01 – 2023-10-03 (×3): 15 mg
  Filled 2023-10-01 (×3): qty 1

## 2023-10-01 MED ORDER — VANCOMYCIN HCL IN DEXTROSE 1-5 GM/200ML-% IV SOLN: 1000.0000 mg | Freq: Once | INTRAVENOUS | Status: DC

## 2023-10-01 MED ORDER — SENNOSIDES-DOCUSATE SODIUM 8.6-50 MG PO TABS: 1.0000 | ORAL_TABLET | Freq: Every evening | ORAL | Status: DC | PRN

## 2023-10-01 MED ORDER — FAMOTIDINE 20 MG PO TABS
40.0000 mg | ORAL_TABLET | Freq: Every day | ORAL | Status: DC
Start: 2023-10-01 — End: 2023-10-04
  Administered 2023-10-01 – 2023-10-04 (×4): 40 mg
  Filled 2023-10-01 (×4): qty 2

## 2023-10-01 MED ORDER — PROSOURCE TF20 ENFIT COMPATIBL EN LIQD
60.0000 mL | Freq: Every day | ENTERAL | Status: DC
  Administered 2023-10-01 – 2023-10-04 (×4): 60 mL
  Filled 2023-10-01 (×4): qty 60

## 2023-10-01 MED ORDER — SODIUM CHLORIDE 0.9 % IV SOLN: 2.0000 g | INTRAVENOUS | Status: DC

## 2023-10-01 MED ORDER — ONDANSETRON HCL 4 MG PO TABS: 4.0000 mg | ORAL_TABLET | Freq: Four times a day (QID) | ORAL | Status: DC | PRN

## 2023-10-01 MED ORDER — SODIUM CHLORIDE 0.9 % IV SOLN
3.0000 g | Freq: Four times a day (QID) | INTRAVENOUS | Status: DC
  Administered 2023-10-01 – 2023-10-04 (×13): 3 g via INTRAVENOUS
  Filled 2023-10-01 (×13): qty 8

## 2023-10-01 MED ORDER — PRIMIDONE 50 MG PO TABS
50.0000 mg | ORAL_TABLET | Freq: Every day | ORAL | Status: DC
Start: 2023-10-01 — End: 2023-10-04
  Administered 2023-10-01 – 2023-10-03 (×3): 50 mg
  Filled 2023-10-01 (×4): qty 1

## 2023-10-01 MED ORDER — PANTOPRAZOLE SODIUM 40 MG IV SOLR
40.0000 mg | Freq: Four times a day (QID) | INTRAVENOUS | Status: AC
  Administered 2023-10-01 – 2023-10-04 (×12): 40 mg via INTRAVENOUS
  Filled 2023-10-01 (×12): qty 10

## 2023-10-01 MED ORDER — ONDANSETRON HCL 4 MG/2ML IJ SOLN
4.0000 mg | Freq: Four times a day (QID) | INTRAMUSCULAR | Status: DC | PRN
  Administered 2023-10-01 – 2023-10-02 (×6): 4 mg via INTRAVENOUS
  Filled 2023-10-01 (×6): qty 2

## 2023-10-01 MED ORDER — PANTOPRAZOLE INFUSION (NEW) - SIMPLE MED: 8.0000 mg/h | INTRAVENOUS | Status: DC

## 2023-10-01 MED ORDER — PANTOPRAZOLE 80MG IVPB - SIMPLE MED
80.0000 mg | Freq: Once | INTRAVENOUS | Status: DC
  Filled 2023-10-01: qty 100

## 2023-10-01 MED ORDER — TRAMADOL HCL 50 MG PO TABS: 50.0000 mg | ORAL_TABLET | Freq: Two times a day (BID) | ORAL | Status: DC | PRN

## 2023-10-01 MED ORDER — FENTANYL CITRATE PF 50 MCG/ML IJ SOSY
12.5000 ug | PREFILLED_SYRINGE | INTRAMUSCULAR | Status: DC | PRN
  Administered 2023-10-01 – 2023-10-04 (×11): 50 ug via INTRAVENOUS
  Filled 2023-10-01 (×11): qty 1

## 2023-10-01 MED ORDER — LOPERAMIDE HCL 2 MG PO CAPS
2.0000 mg | ORAL_CAPSULE | Freq: Four times a day (QID) | ORAL | Status: DC | PRN
  Administered 2023-10-01 – 2023-10-02 (×2): 2 mg via ORAL
  Filled 2023-10-01 (×2): qty 1

## 2023-10-01 MED ORDER — HYDRALAZINE HCL 20 MG/ML IJ SOLN: 2.0000 mg | Freq: Four times a day (QID) | INTRAMUSCULAR | Status: DC | PRN

## 2023-10-01 MED ORDER — LACTATED RINGERS IV SOLN: INTRAVENOUS | Status: AC

## 2023-10-01 MED ORDER — PANTOPRAZOLE SODIUM 40 MG IV SOLR
40.0000 mg | INTRAVENOUS | Status: AC
  Administered 2023-10-01: 40 mg via INTRAVENOUS
  Filled 2023-10-01: qty 10

## 2023-10-01 MED ORDER — SODIUM CHLORIDE 0.9% FLUSH
3.0000 mL | Freq: Two times a day (BID) | INTRAVENOUS | Status: DC
  Administered 2023-10-01 – 2023-10-04 (×6): 3 mL via INTRAVENOUS

## 2023-10-01 MED ORDER — METRONIDAZOLE 500 MG/100ML IV SOLN
500.0000 mg | Freq: Once | INTRAVENOUS | Status: AC
  Administered 2023-10-01: 500 mg via INTRAVENOUS
  Filled 2023-10-01: qty 100

## 2023-10-01 MED ORDER — PRAVASTATIN SODIUM 40 MG PO TABS
40.0000 mg | ORAL_TABLET | Freq: Every day | ORAL | Status: DC
Start: 2023-10-01 — End: 2023-10-04
  Administered 2023-10-01 – 2023-10-04 (×4): 40 mg
  Filled 2023-10-01 (×4): qty 1

## 2023-10-01 MED ORDER — DIATRIZOATE MEGLUMINE & SODIUM 66-10 % PO SOLN
30.0000 mL | Freq: Once | ORAL | Status: DC
  Filled 2023-10-01: qty 30

## 2023-10-01 MED ORDER — ALLOPURINOL 100 MG PO TABS
100.0000 mg | ORAL_TABLET | Freq: Every day | ORAL | Status: DC
  Administered 2023-10-01 – 2023-10-04 (×4): 100 mg
  Filled 2023-10-01 (×4): qty 1

## 2023-10-01 MED ORDER — PANTOPRAZOLE SODIUM 40 MG IV SOLR
40.0000 mg | Freq: Two times a day (BID) | INTRAVENOUS | Status: DC
  Administered 2023-10-04: 40 mg via INTRAVENOUS
  Filled 2023-10-01: qty 10

## 2023-10-01 MED ORDER — ALBUTEROL SULFATE (2.5 MG/3ML) 0.083% IN NEBU: 2.5000 mg | INHALATION_SOLUTION | RESPIRATORY_TRACT | Status: DC | PRN

## 2023-10-01 MED ORDER — METRONIDAZOLE 500 MG/100ML IV SOLN: 500.0000 mg | Freq: Two times a day (BID) | INTRAVENOUS | Status: DC

## 2023-10-01 MED ORDER — OSMOLITE 1.5 CAL PO LIQD
1000.0000 mL | ORAL | Status: DC
  Administered 2023-10-01 – 2023-10-04 (×3): 1000 mL
  Filled 2023-10-01 (×5): qty 1000

## 2023-10-01 MED ORDER — CYANOCOBALAMIN 500 MCG PO TABS
250.0000 ug | ORAL_TABLET | Freq: Every day | ORAL | Status: DC
  Administered 2023-10-01 – 2023-10-04 (×4): 250 ug
  Filled 2023-10-01 (×4): qty 1

## 2023-10-01 MED ORDER — PANTOPRAZOLE 80MG IVPB - SIMPLE MED: 80.0000 mg | Freq: Once | INTRAVENOUS | Status: DC

## 2023-10-01 MED ORDER — MOMETASONE FURO-FORMOTEROL FUM 200-5 MCG/ACT IN AERO
2.0000 | INHALATION_SPRAY | Freq: Two times a day (BID) | RESPIRATORY_TRACT | Status: DC
  Administered 2023-10-01 – 2023-10-04 (×5): 2 via RESPIRATORY_TRACT
  Filled 2023-10-01: qty 8.8

## 2023-10-01 MED ORDER — FREE WATER
165.0000 mL | Status: DC
  Administered 2023-10-01 – 2023-10-04 (×17): 165 mL

## 2023-10-01 MED ORDER — DIATRIZOATE MEGLUMINE & SODIUM 66-10 % PO SOLN
ORAL | Status: AC
  Filled 2023-10-01: qty 30

## 2023-10-01 MED ORDER — SODIUM CHLORIDE 0.9 % IV SOLN
2.0000 g | Freq: Once | INTRAVENOUS | Status: AC
  Administered 2023-10-01: 2 g via INTRAVENOUS
  Filled 2023-10-01: qty 10

## 2023-10-01 MED ORDER — VANCOMYCIN HCL 1250 MG/250ML IV SOLN
1250.0000 mg | Freq: Once | INTRAVENOUS | Status: DC
  Filled 2023-10-01: qty 250

## 2023-10-01 NOTE — ED Notes (Signed)
Patient's dressing has been changed around G tube and area has been cleaned. Area is pink.

## 2023-10-01 NOTE — ED Provider Notes (Signed)
Patient signed out to me at shift change pending CT.  Patient here with concern for obstructed or dysfunctional PEG tube.  Patient had episode of bloody vomit just after CT.  He was put on high flow nasal cannula and had nasotracheal suction by respiratory therapy.  He is doing better with regards to his breathing, but still sounds crackly and appears to have aspirated.  Patient started on broad-spectrum antibiotics.  He is also given Protonix bolus and push doses for suspected GI bleed.  Dr. Madilyn Hook was able to reposition the PEG tube.  We attached it to low intermittent suction and have drained approximately 500 mL of dark gastric content.  I asked the patient about intubation if his airway became compromised.  He tells me DO NOT INTUBATE.   Will consult hospitalist for admission.  Appreciate Dr. Antionette Char for admitting.   Physical Exam  BP (!) 169/80 (BP Location: Right Arm)   Pulse 87   Temp 98.4 F (36.9 C) (Oral)   Resp 17   Ht 5\' 10"  (1.778 m)   Wt 59.7 kg   SpO2 100%   BMI 18.88 kg/m     Procedures  .Critical Care  Performed by: Roxy Horseman, PA-C Authorized by: Roxy Horseman, PA-C   Critical care provider statement:    Critical care time (minutes):  55   Critical care was necessary to treat or prevent imminent or life-threatening deterioration of the following conditions:  Respiratory failure   Critical care was time spent personally by me on the following activities:  Development of treatment plan with patient or surrogate, discussions with consultants, evaluation of patient's response to treatment, examination of patient, ordering and review of laboratory studies, ordering and review of radiographic studies, ordering and performing treatments and interventions, pulse oximetry, re-evaluation of patient's condition and review of old charts   ED Course / MDM    Medical Decision Making Amount and/or Complexity of Data Reviewed Labs: ordered. Radiology:  ordered.  Risk Prescription drug management. Decision regarding hospitalization.          Roxy Horseman, PA-C 10/01/23 0155    Tilden Fossa, MD 10/01/23 [phone]

## 2023-10-01 NOTE — Progress Notes (Addendum)
   IR Note  Pt known to IR Hx G tube placed in OR with Dr Bedelia Person 03/19/23 24 Fr Bumper retention  Pt requested different tube and Dr Fredia Sorrow removed G tube 10/31 and replaced with 24 Fr       2.5 cm Balloon retention tube  Now with abd pain CT yesterday:  IMPRESSION: 1. Interval development of extensive airway impaction within the right middle and lower lobes with discoid atelectasis within the right lower lobe. Given findings within the stomach, this may relate to aspiration. 2. Extensive multi-vessel coronary artery calcification. 3. Balloon retention gastrostomy catheter with its tip within the post pyloric duodenal bulb. The stomach is moderately fluid distended suggesting at least some degree of gastric outlet obstruction. Repositioning is recommended. 4. Severe sigmoid diverticulosis without superimposed acute inflammatory change.  Scheduled now for injection and possible exchange in IR  Pt really wants "a tube that he can control the outflow".. Says when he opens this tube - contents come out, which did not occur with original G tube.

## 2023-10-01 NOTE — Progress Notes (Addendum)
Pharmacy Antibiotic Note  Brian Dunn is a 82 y.o. male admitted on 09/30/2023 with concern for aspiration pneumonia.  Pharmacy has been consulted for Unasyn dosing.  PCN allergy noted but pt reports that the reaction of "swelling in the hands" occurred in the 1960s and that he has had PCNs since then without issue.  Plan: Unasyn 3g IV Q6H.  Height: 5\' 10"  (177.8 cm) Weight: 59.7 kg (131 lb 9.8 oz) IBW/kg (Calculated) : 73  Temp (24hrs), Avg:98.4 F (36.9 C), Min:98.4 F (36.9 C), Max:98.4 F (36.9 C)  Recent Labs  Lab 09/30/23 1604 10/01/23 0149  WBC 9.2  --   CREATININE 0.80  --   LATICACIDVEN  --  1.1    Estimated Creatinine Clearance: 59.1 mL/min (by C-G formula based on SCr of 0.8 mg/dL).    Allergies  Allergen Reactions   Metoprolol Other (See Comments)    Weight gain   Morphine Rash   Penicillins Swelling    10/01/23: Pt reports that PCN caused "swelling hands" in the 1960s and that he has taken penicillins since then without issue.    Thank you for allowing pharmacy to be a part of this patient's care.  Vernard Gambles, PharmD, BCPS  10/01/2023 5:33 AM

## 2023-10-01 NOTE — TOC CM/SW Note (Signed)
Transition of Care [facility]) - Inpatient Brief Assessment   Patient Details  Name: Nahuel Ence MRN: [id number] Date of Birth: [birth year]  [facility] CM/SW Contact:    Mearl Latin, LCSW Phone Number: 10/01/2023, 5:00 PM   Clinical Narrative: Patient admitted from home with spouse. He is currently active with [facility] services. TOC will continue to follow.    Transition of Care Asessment: Insurance and Status: Insurance coverage has been reviewed Patient has primary care physician: Yes Home environment has been reviewed: From home Prior level of function:: Modified independent Prior/Current Home Services: Current home services Social Determinants of Health Reivew: SDOH reviewed no interventions necessary Readmission risk has been reviewed: Yes Transition of care needs: transition of care needs identified, TOC will continue to follow

## 2023-10-01 NOTE — Evaluation (Signed)
Occupational Therapy Evaluation Patient Details Name: Brian Dunn MRN: [id number] DOB: 1941-06-29 Today's Date: 10/01/2023   History of Present Illness Brian Dunn is a 82 y.o. male who presented with abdominal pain, nausea, vomiting, cough, and shortness of breath. CT at an outside hospital, was told that the PEG tube tip was in the duodenum, and was advised to seek care at [facility] where the tube was placed. PMH: CAD, COPD, CKD, Dysphasgia, Essential HTN, Osteopenia   Clinical Impression   Brian Dunn was evaluated s/p the above admission list. He is mod I with rollator at baseline but also reports 2 recent falls. Upon evaluation the pt was limited by activity tolerance, rib and stomach pain, new O2 demand and generalized weakness. Overall he needed up to CGA for mobility with RW and min A to get back into the bed. Due to the deficits listed below the pt also needs up to CGA for ADLs with cues for energy conservation and PLB. Pt will benefit from continued acute OT services and discharge home with support of family.        If plan is discharge home, recommend the following: A little help with walking and/or transfers;A little help with bathing/dressing/bathroom;Assistance with cooking/housework;Assist for transportation    Functional Status Assessment  Patient has had a recent decline in their functional status and demonstrates the ability to make significant improvements in function in a reasonable and predictable amount of time.  Equipment Recommendations  None recommended by OT       Precautions / Restrictions Precautions Precautions: Fall Precaution Comments: peg Restrictions Weight Bearing Restrictions: No      Mobility Bed Mobility Overal bed mobility: Needs Assistance Bed Mobility: Supine to Sit, Sit to Supine     Supine to sit: Contact guard Sit to supine: Min assist        Transfers Overall transfer level: Needs assistance Equipment used: Rolling  walker (2 wheels) Transfers: Sit to/from Stand Sit to Stand: Contact guard assist                  Balance Overall balance assessment: Mild deficits observed, not formally tested         ADL either performed or assessed with clinical judgement   ADL Overall ADL's : Needs assistance/impaired Eating/Feeding: NPO   Grooming: Set up;Sitting   Upper Body Bathing: Set up;Sitting   Lower Body Bathing: Contact guard assist;Sit to/from stand   Upper Body Dressing : Set up;Sitting   Lower Body Dressing: Contact guard assist;Sit to/from stand   Toilet Transfer: Contact guard assist;Ambulation;Rolling walker (2 wheels)   Toileting- Clothing Manipulation and Hygiene: Contact guard assist;Sit to/from stand       Functional mobility during ADLs: Contact guard assist;Rolling walker (2 wheels) General ADL Comments: no physical assist, pt able to complete pericare in standing. 4L maintain throughout     Vision Baseline Vision/History: 0 No visual deficits Vision Assessment?: No apparent visual deficits     Perception Perception: Within Functional Limits       Praxis Praxis: WFL       Pertinent Vitals/Pain Pain Assessment Pain Assessment: Faces Faces Pain Scale: Hurts a little bit Pain Location: ribs and stomach Pain Descriptors / Indicators: Discomfort Pain Intervention(s): Limited activity within patient's tolerance, Monitored during session     Extremity/Trunk Assessment Upper Extremity Assessment Upper Extremity Assessment: Generalized weakness   Lower Extremity Assessment Lower Extremity Assessment: Defer to PT evaluation   Cervical / Trunk Assessment Cervical / Trunk Assessment:  Kyphotic;Other exceptions Cervical / Trunk Exceptions: brusing from recent fall   Communication Communication Communication: No apparent difficulties   Cognition Arousal: Alert Behavior During Therapy: WFL for tasks assessed/performed Overall Cognitive Status: Within Functional  Limits for tasks assessed             General Comments  VSS with 4L, RN present at the end of the session            Home Living Family/patient expects to be discharged to:: Private residence Living Arrangements: Spouse/significant other Available Help at Discharge: Family;Available 24 hours/day Type of Home: House Home Access: Stairs to enter [employer] of Steps: 3   Home Layout: One level     Bathroom Shower/Tub: Chief Strategy Officer: Standard Bathroom Accessibility: Yes   Home Equipment: Grab bars - tub/shower;Shower Counsellor (2 wheels);Rollator (4 wheels)          Prior Functioning/Environment Prior Level of Function : Independent/Modified Independent;Driving             Mobility Comments: rollator for night time use and intermittent day time use, 2 recent falls ADLs Comments: mod I, spouse assists as needed        OT Problem List: Decreased range of motion;Decreased activity tolerance;Decreased knowledge of use of DME or AE;Decreased knowledge of precautions      OT Treatment/Interventions: Self-care/ADL training;Therapeutic exercise;DME and/or AE instruction;Therapeutic activities;Patient/family education;Balance training    OT Goals(Current goals can be found in the care plan section) Acute Rehab OT Goals Patient Stated Goal: home OT Goal Formulation: With patient Time For Goal Achievement: 10/15/23 Potential to Achieve Goals: Good ADL Goals Additional ADL Goal #1: Pt will complete BADLs with mod I Additional ADL Goal #2: pt will indep recall at least 3 energy conservation strategies to apply at discharge  OT Frequency: Min 1X/week       AM-PAC OT "6 Clicks" Daily Activity     Outcome Measure Help from another person eating meals?: Total Help from another person taking care of personal grooming?: A Little Help from another person toileting, which includes using toliet, bedpan, or urinal?: A Little Help  from another person bathing (including washing, rinsing, drying)?: A Little Help from another person to put on and taking off regular upper body clothing?: A Little Help from another person to put on and taking off regular lower body clothing?: A Little 6 Click Score: 16   End of Session Equipment Utilized During Treatment: Rolling walker (2 wheels);Gait belt Nurse Communication: Mobility status  Activity Tolerance: Patient tolerated treatment well Patient left: in bed;with call bell/phone within reach;with bed alarm set  OT Visit Diagnosis: Other abnormalities of gait and mobility (R26.89);Muscle weakness (generalized) (M62.81)                Time: 1610-9604 OT Time Calculation (min): 23 min Charges:  OT General Charges $OT Visit: 1 Visit OT Evaluation $OT Eval Moderate Complexity: 1 Mod OT Treatments $Self Care/Home Management : 8-22 mins  Derenda Mis, OTR/L Acute Rehabilitation Services Office [phone] Secure Chat Communication Preferred   Donia Pounds 10/01/2023, 2:46 PM

## 2023-10-01 NOTE — Plan of Care (Signed)
  Problem: Education: Goal: Knowledge of discharge needs will improve Outcome: Progressing   Problem: Clinical Measurements: Goal: Postoperative complications will be avoided or minimized Outcome: Progressing   Problem: Respiratory: Goal: Ability to achieve and maintain a regular respiratory rate will improve Outcome: Progressing   Problem: Skin Integrity: Goal: Demonstration of wound healing without infection will improve Outcome: Progressing   Problem: Education: Goal: Knowledge of General Education information will improve Description: Including pain rating scale, medication(s)/side effects and non-pharmacologic comfort measures Outcome: Progressing   Problem: Health Behavior/Discharge Planning: Goal: Ability to manage health-related needs will improve Outcome: Progressing   Problem: Clinical Measurements: Goal: Ability to maintain clinical measurements within normal limits will improve Outcome: Progressing Goal: Will remain free from infection Outcome: Progressing Goal: Diagnostic test results will improve Outcome: Progressing Goal: Respiratory complications will improve Outcome: Progressing Goal: Cardiovascular complication will be avoided Outcome: Progressing   Problem: Activity: Goal: Risk for activity intolerance will decrease Outcome: Progressing   Problem: Nutrition: Goal: Adequate nutrition will be maintained Outcome: Progressing   Problem: Coping: Goal: Level of anxiety will decrease Outcome: Progressing   Problem: Elimination: Goal: Will not experience complications related to bowel motility Outcome: Progressing Goal: Will not experience complications related to urinary retention Outcome: Progressing   Problem: Pain Management: Goal: General experience of comfort will improve Outcome: Progressing   Problem: Safety: Goal: Ability to remain free from injury will improve Outcome: Progressing   Problem: Skin Integrity: Goal: Risk for impaired  skin integrity will decrease Outcome: Progressing   Problem: Activity: Goal: Ability to tolerate increased activity will improve Outcome: Progressing   Problem: Clinical Measurements: Goal: Ability to maintain a body temperature in the normal range will improve Outcome: Progressing   Problem: Respiratory: Goal: Ability to maintain adequate ventilation will improve Outcome: Progressing Goal: Ability to maintain a clear airway will improve Outcome: Progressing

## 2023-10-01 NOTE — ED Notes (Addendum)
ED TO INPATIENT HANDOFF REPORT  ED Nurse Name and Phone #: Gardiner Ramus [phone]  S Name/Age/Gender Brian Dunn 82 y.o. male Room/Bed: 002C/002C  Code Status   Code Status: Prior  Home/SNF/Other Home A&Ox4 Is this baseline? Yes   Triage Complete: Triage complete  Chief Complaint Aspiration into airway, initial encounter [T17.908A]  Triage Note Pt reports abdominal pain. Pt reports he was seen yesterday and was told his G-tube is in the duodenum and not the stomach. Pt was instructed to come to [facility] due to having the tube placed at [facility].    Allergies Allergies  Allergen Reactions   Metoprolol Other (See Comments)    Weight gain   Penicillins Swelling   Morphine Rash    Level of Care/Admitting Diagnosis ED Disposition     ED Disposition  Admit   Condition  --   Comment  Hospital Area: [facility] [[id number]]  Level of Care: Progressive [102]  Admit to Progressive based on following criteria: MULTISYSTEM THREATS such as stable sepsis, metabolic/electrolyte imbalance with or without encephalopathy that is responding to early treatment.  May admit patient to [facility] or [facility] if equivalent level of care is available:: Yes  Covid Evaluation: Asymptomatic - no recent exposure (last 10 days) testing not required  Diagnosis: Aspiration into airway, initial encounter [[id number]]  Admitting Physician: Briscoe Deutscher [[id number]]  Attending Physician: Briscoe Deutscher [[id number]]  Certification:: I certify this patient will need inpatient services for at least 2 midnights  Expected Medical Readiness: 10/04/2023          B Medical/Surgery History Past Medical History:  Diagnosis Date   Acute bronchitis 05/29/2012   Allergic rhinitis    Anemia in chronic kidney disease 02/27/2021   Aneurysm of right subclavian artery (HCC) 02/07/2023   Angina pectoris (HCC) 11/07/2020   Angina, class III (HCC) 11/07/2020   Asthma 06/18/2012   Followed  in Pulmonary clinic/ [facility]/ [facility]  - 01/09/2018  After extensive coaching inhaler device  effectiveness =   75% > continue symb 160 2bid and return for pfts in 3 months ? RA bronchiolitis?  - PFT's  04/23/2018  FEV1 2.84 (91 % ) ratio 73  p no % improvement from saba p symb 160 prior to study with DLCO  76 % corrects to 79  % for alv volume   - 04/23/2018  After extensive coaching   Benign essential hypertension 06/20/2016   Bilateral inguinal hernia without obstruction or gangrene 06/20/2016   CAD (coronary artery disease), native coronary artery 06/20/2016   Chest pain, unspecified    Chronic ischemic heart disease, unspecified 01/28/2022   Chronic kidney disease 01/28/2022   Closed fracture of part of neck of femur (HCC) 01/22/2007   Annotation: right-sided status post surgery  Qualifier: Diagnosis of   By: Drue Novel MD, Nolon Rod     IMO SNOMED Dx Update Oct 2024     COPD (chronic obstructive pulmonary disease) (HCC)    Coronary artery disease involving native coronary artery of native heart with angina pectoris (HCC) 06/20/2016   Cough 03/25/2012   Followed in Pulmonary clinic/ [facility]/ [place]    - Trial off acei again  03/25/2012    - Sinus CT 01/24/11 There is mucosal thickening within the paranasal sinuses without  air-fluid levels. Neither ostiomeatal unit is patent.    Cough variant asthma 10/18/2010   Followed in Pulmonary clinic/ [facility]/ [place]  -HFA 75% p coaching 01/22/2011  -PFT's  02/04/2011 minimal airflow obst, nl dlco    Disorder of bone and cartilage 01/22/2007   Qualifier: Diagnosis of  By: Drue Novel MD, Nolon Rod.   Overview:  Overview:  Qualifier: Diagnosis of  By: Drue Novel MD, Jose E.   Dysphagia 12/27/2022   Onset ? Aug 2022 p febrile illness ? Asp pna   -  MBS 01/13/23   PO Diet Recommendation: Regular; Thin liquids (Level 0)  Liquid Administration via: Cup; Spoon; Straw  Supervision: Patient able to self-feed  Postural changes: -- (try reclining when sipping liquids)  Oral  care recommendations: Oral care QID (4x/day); Oral care before ice   chips/water  Recommended consults: Consider ENT consultation   Encounter for fitting and adjustment of hearing aid 01/29/2022   Esophageal dysmotility 03/15/2023   Essential hypertension 11/06/2017   GERD 01/22/2007   Annotation: history of esophageal stricture Qualifier: Diagnosis of  By: Drue Novel MD, Nolon Rod.    GERD (gastroesophageal reflux disease)    Gout 01/28/2022   Hip fracture (HCC)    Hyperlipidemia 06/20/2016   Hyperlipidemia with target LDL less than 70 06/20/2016   Hypertension    HYPOGONADISM 02/18/2007   Qualifier: Diagnosis of  By: Drue Novel MD, Nolon Rod.    Hypogonadism male    Kommerell's diverticulum 03/11/2023   Low back pain 09/09/2013   Multiple lung nodules on CT 11/21/2015   CT [place] 10/17/15 mpns > 15 y since quit smoking > rec 12 m f/u as this is low risk > done 06/04/16 no change rec recheck in 72m  - CT [place] 12/08/17 no change nodules > meets benign criteria > no directed f/u - Quantiferon GOLD TB 01/09/18 neg    Osteopenia    Personal history of malignant neoplasm of prostate    Postoperative visit 08/14/2016   Preop cardiovascular exam 02/26/2023   Protein-calorie malnutrition, severe 03/14/2023   Sensorineural hearing loss, bilateral 01/28/2022   Syncope 03/27/2023   Tremor, essential 02/18/2007   Overview:  Overview:  Qualifier: Diagnosis of  By: Drue Novel MD, Nolon Rod.  Last Assessment & Plan:  Reviewed tremor may increase on dulera so will need to be balanced against benefits to cough   Past Surgical History:  Procedure Laterality Date   CAROTID-SUBCLAVIAN BYPASS GRAFT Right 03/11/2023   Procedure: RIGHT SUBCLAVIAN-CAROTID TRANSPOSITION;  Surgeon: Maeola Harman, MD;  Location: [facility] OR;  Service: Vascular;  Laterality: Right;   CORONARY ANGIOPLASTY WITH STENT PLACEMENT  11/04/2006   CORONARY STENT INTERVENTION N/A 11/16/2020   Procedure: CORONARY STENT INTERVENTION;  Surgeon: Marykay Lex,  MD;  Location: [facility] INVASIVE CV LAB;  Service: Cardiovascular;  Laterality: N/A;   EYE SURGERY Bilateral 11/2022   cataract removal   INSERTION OF ILIAC STENT Left 03/11/2023   Procedure: INSERTION OF LEFT ILIAC STENT USING VIABAHN VBX X 54. STENT;  Surgeon: Maeola Harman, MD;  Location[facility] OR;  Service: Vascular;  Laterality: Left;   INSERTION PROSTATE RADIATION SEED     IR REPLC GASTRO/COLONIC TUBE PERCUT W/FLUORO  09/04/2023   LEFT HEART CATH AND CORONARY ANGIOGRAPHY N/A 11/16/2020   Procedure: LEFT HEART CATH AND CORONARY ANGIOGRAPHY;  Surgeon: Marykay Lex, MD;  Location: [facility] INVASIVE CV LAB;  Service: Cardiovascular;  Laterality: N/A;   PEG PLACEMENT N/A 03/19/2023   Procedure: PERCUTANEOUS ENDOSCOPIC GASTROSTOMY (PEG) PLACEMENT;  Surgeon: Diamantina Monks, MD;  Location: [facility] OR;  Service: General;  Laterality: N/A;   THORACIC AORTIC ENDOVASCULAR STENT GRAFT Bilateral 03/11/2023   Procedure: THORACIC AORTIC ENDOVASCULAR  STENT GRAFT USING ZENITH ALPHA X STENT (COOK);  Surgeon: Maeola Harman, MD;  Location: [facility] OR;  Service: Vascular;  Laterality: Bilateral;   ULTRASOUND GUIDANCE FOR VASCULAR ACCESS Bilateral 03/11/2023   Procedure: ULTRASOUND GUIDANCE FOR VASCULAR ACCESS;  Surgeon: Maeola Harman, MD;  Location[facility];  Service: Vascular;  Laterality: Bilateral;     A IV Location/Drains/Wounds Patient Lines/Drains/Airways Status     Active Line/Drains/Airways     Name Placement date Placement time Site Days   Peripheral IV 09/30/23 22 G 1" Anterior;Right Forearm 09/30/23  2329  Forearm  1   Gastrostomy/Enterostomy Gastrostomy 24 Fr. LUQ 09/04/23  1130  LUQ  27            Intake/Output Last 24 hours  Intake/Output Summary (Last 24 hours) at 10/01/2023 0226 Last data filed at 10/01/2023 0222 Gross per 24 hour  Intake 200.23 ml  Output 800 ml  Net -599.77 ml    Labs/Imaging Results for orders placed or performed during the  hospital encounter of 09/30/23 (from the past 48 hour(s))  Lipase, blood     Status: None   Collection Time: 09/30/23  4:04 PM  Result Value Ref Range   Lipase 29 11 - 51 U/L    Comment: Performed at [facility], [address], [place], [place] [address]  Comprehensive metabolic panel     Status: Abnormal   Collection Time: 09/30/23  4:04 PM  Result Value Ref Range   Sodium 136 135 - 145 mmol/L   Potassium 4.1 3.5 - 5.1 mmol/L   Chloride 101 98 - 111 mmol/L   CO2 23 22 - 32 mmol/L   Glucose, Bld 109 (H) 70 - 99 mg/dL    Comment: Glucose reference range applies only to samples taken after fasting for at least 8 hours.   BUN 23 8 - 23 mg/dL   Creatinine, Ser 3.66 0.61 - 1.24 mg/dL   Calcium 9.6 8.9 - 44.0 mg/dL   Total Protein 7.2 6.5 - 8.1 g/dL   Albumin 3.8 3.5 - 5.0 g/dL   AST 27 15 - 41 U/L   ALT 20 0 - 44 U/L   Alkaline Phosphatase 45 38 - 126 U/L   Total Bilirubin 0.8 <1.2 mg/dL   GFR, Estimated >34 >74 mL/min    Comment: (NOTE) Calculated using the CKD-EPI Creatinine Equation (2021)    Anion gap 12 5 - 15    Comment: Performed at [facility], [address], [place], [place] [address]  CBC     Status: Abnormal   Collection Time: 09/30/23  4:04 PM  Result Value Ref Range   WBC 9.2 4.0 - 10.5 K/uL   RBC 3.58 (L) 4.22 - 5.81 MIL/uL   Hemoglobin 11.4 (L) 13.0 - 17.0 g/dL   HCT 38.7 (L) 56.4 - 33.2 %   MCV 97.8 80.0 - 100.0 fL   MCH 31.8 26.0 - 34.0 pg   MCHC 32.6 30.0 - 36.0 g/dL   RDW 95.1 88.4 - 16.6 %   Platelets 201 150 - 400 K/uL   nRBC 0.0 0.0 - 0.2 %    Comment: Performed at [facility], [address], [place], [place] [address]  Troponin I (High Sensitivity)     Status: None   Collection Time: 09/30/23 11:38 PM  Result Value Ref Range   Troponin I (High Sensitivity) 12 <18 ng/L    Comment: (NOTE) Elevated high sensitivity troponin I (hsTnI) values  and significant  changes across serial measurements may suggest ACS but many other   chronic and acute conditions are known to elevate hsTnI results.  Refer to the "Links" section for chest pain algorithms and additional  guidance. Performed at [facility], [address], [place], [place] [address]   Urinalysis, Routine w reflex microscopic -Urine, Clean Catch     Status: Abnormal   Collection Time: 10/01/23 12:03 AM  Result Value Ref Range   Color, Urine YELLOW YELLOW   APPearance HAZY (A) CLEAR   Specific Gravity, Urine 1.032 (H) 1.005 - 1.030   pH 7.0 5.0 - 8.0   Glucose, UA NEGATIVE NEGATIVE mg/dL   Hgb urine dipstick NEGATIVE NEGATIVE   Bilirubin Urine NEGATIVE NEGATIVE   Ketones, ur 20 (A) NEGATIVE mg/dL   Protein, ur 30 (A) NEGATIVE mg/dL   Nitrite NEGATIVE NEGATIVE   Leukocytes,Ua NEGATIVE NEGATIVE   RBC / HPF 11-20 0 - 5 RBC/hpf   WBC, UA 0-5 0 - 5 WBC/hpf   Bacteria, UA FEW (A) NONE SEEN   Squamous Epithelial / HPF 0-5 0 - 5 /HPF   Mucus PRESENT    Hyaline Casts, UA PRESENT     Comment: Performed at [facility], [address], [place], [place] [address]  Brain natriuretic peptide     Status: Abnormal   Collection Time: 10/01/23 12:24 AM  Result Value Ref Range   B Natriuretic Peptide 116.2 (H) 0.0 - 100.0 pg/mL    Comment: Performed at [facility], [address] [place], [place] [address]  Troponin I (High Sensitivity)     Status: None   Collection Time: 10/01/23  1:45 AM  Result Value Ref Range   Troponin I (High Sensitivity) 15 <18 ng/L    Comment: (NOTE) Elevated high sensitivity troponin I (hsTnI) values and significant  changes across serial measurements may suggest ACS but many other  chronic and acute conditions are known to elevate hsTnI results.  Refer to the "Links" section for chest pain algorithms and additional  guidance. Performed at [facility], [address] [place], [place] [address]   I-Stat CG4 Lactic Acid     Status: None   Collection Time: 10/01/23  1:49 AM  Result Value Ref Range    Lactic Acid, Venous 1.1 0.5 - 1.9 mmol/L   DG Chest Port 1 View  Result Date: 10/01/2023 CLINICAL DATA:  Aspiration EXAM: PORTABLE CHEST 1 VIEW COMPARISON:  CT 09/30/2023, 03/26/2023 FINDINGS: Coronary stent. Stent graft at the aortic arch. Right infrahilar bandlike opacity, corresponding to CT demonstrated right lower lobe atelectasis stable cardiomediastinal silhouette. No pneumothorax. Old rib fractures IMPRESSION: Right infrahilar bandlike opacity, corresponding to CT demonstrated right lower lobe atelectasis. Electronically Signed   By: Jasmine Pang M.D.   On: 10/01/2023 01:38   CT ABDOMEN PELVIS W CONTRAST  Result Date: 10/01/2023 CLINICAL DATA:  Acute nonlocalized abdominal pain EXAM: CT ABDOMEN AND PELVIS WITH CONTRAST TECHNIQUE: Multidetector CT imaging of the abdomen and pelvis was performed using the standard protocol following bolus administration of intravenous contrast. RADIATION DOSE REDUCTION: This exam was performed according to the departmental dose-optimization program which includes automated exposure control, adjustment of the mA and/or kV according to patient size and/or use of iterative reconstruction technique. CONTRAST:  75mL OMNIPAQUE IOHEXOL 350 MG/ML SOLN COMPARISON:  09/29/2023 FINDINGS: Lower chest: There has developed extensive airway impaction within the right middle and lower lobes with discoid atelectasis within the right lower lobe.  No focal consolidation. No pleural effusion. Extensive multi-vessel coronary artery calcification. Hepatobiliary: No focal liver abnormality is seen. No gallstones, gallbladder wall thickening, or biliary dilatation. Pancreas: Unremarkable Spleen: Unremarkable Adrenals/Urinary Tract: Adrenal glands are unremarkable. Kidneys are normal, without renal calculi, focal lesion, or hydronephrosis. Bladder is unremarkable. Stomach/Bowel: Balloon retention gastrostomy catheter is again seen with its tip within the post pyloric duodenal bulb. The  stomach is moderately fluid distended suggesting at least some degree of gastric outlet obstruction. Severe sigmoid diverticulosis. Stomach, small bowel, large bowel are otherwise unremarkable. Appendix normal. No free intraperitoneal gas or fluid. Vascular/Lymphatic: Extensive aortoiliac atherosclerotic calcification. Vascular stent seen within the left common and external iliac arteries with wide patency of the stented segment. No pathologic adenopathy within the abdomen and pelvis. Reproductive: Brachytherapy seeds are seen within the prostate gland Other: No abdominal wall hernia Musculoskeletal: Osseous structures are diffusely osteopenic. Remote L1 compression deformity again noted. Advanced degenerative changes are seen throughout the lumbar spine. Right posterior acetabular wall ORIF has been performed. No acute bone abnormality. No lytic or blastic bone lesion. IMPRESSION: 1. Interval development of extensive airway impaction within the right middle and lower lobes with discoid atelectasis within the right lower lobe. Given findings within the stomach, this may relate to aspiration. 2. Extensive multi-vessel coronary artery calcification. 3. Balloon retention gastrostomy catheter with its tip within the post pyloric duodenal bulb. The stomach is moderately fluid distended suggesting at least some degree of gastric outlet obstruction. Repositioning is recommended. 4. Severe sigmoid diverticulosis without superimposed acute inflammatory change. Aortic Atherosclerosis (ICD10-I70.0). Electronically Signed   By: Helyn Numbers M.D.   On: 10/01/2023 00:18   DG Chest 2 View  Result Date: 09/30/2023 CLINICAL DATA:  Dyspnea EXAM: CHEST - 2 VIEW COMPARISON:  06/26/2023 FINDINGS: Discoid atelectasis right base. Lungs are otherwise clear. No pneumothorax or pleural effusion. Cardiac size within limits. The stent graft noted within the aortic arch unchanged. Healed bilateral rib fractures noted. No acute bone.  IMPRESSION: 1. Right basilar discoid atelectasis. Electronically Signed   By: Helyn Numbers M.D.   On: 09/30/2023 21:48    Pending Labs Unresulted Labs (From admission, onward)     Start     Ordered   09/30/23 2004  Occult bld gastric/duodenum (cup to lab)  Once,   URGENT        09/30/23 2003            Vitals/Pain Today's Vitals   09/30/23 2115 10/01/23 0000 10/01/23 0130 10/01/23 0141  BP: (!) 169/80  (!) 161/78   Pulse: 87  98   Resp: 17  (!) 26   Temp: 98.4 F (36.9 C)     TempSrc: Oral     SpO2: 100%  100%   Weight:  59.7 kg    Height:  5\' 10"  (1.778 m)    PainSc:    7     Isolation Precautions No active isolations  Medications Medications  vancomycin (VANCOREADY) IVPB 1250 mg/250 mL (has no administration in time range)  pantoprazole (PROTONIX) injection 40 mg ( Intravenous Canceled Entry 10/01/23 0106)    Followed by  pantoprazole (PROTONIX) injection 40 mg (has no administration in time range)    Followed by  pantoprazole (PROTONIX) injection 40 mg (has no administration in time range)  diatrizoate meglumine-sodium (GASTROGRAFIN) 66-10 % solution 30 mL (has no administration in time range)  sodium chloride 0.9 % bolus 500 mL (0 mLs Intravenous Stopped 09/30/23 2307)  ondansetron (ZOFRAN) injection 4 mg (4  mg Intravenous Given 10/01/23 0018)  fentaNYL (SUBLIMAZE) injection 50 mcg (50 mcg Intravenous Given 10/01/23 0019)  iohexol (OMNIPAQUE) 350 MG/ML injection 75 mL (75 mLs Intravenous Contrast Given 09/30/23 2254)  aztreonam (AZACTAM) 2 g in sodium chloride 0.9 % 100 mL IVPB (0 g Intravenous Stopped 10/01/23 0222)  metroNIDAZOLE (FLAGYL) IVPB 500 mg (0 mg Intravenous Stopped 10/01/23 0150)    Mobility non-ambulatory     Focused Assessments Cardiac Assessment Handoff:    No results found for: "CKTOTAL", "CKMB", "CKMBINDEX", "TROPONINI" No results found for: "DDIMER" Does the Patient currently have chest pain? No   , Pulmonary Assessment  Handoff:  Lung sounds: Bilateral Breath Sounds: Expiratory wheezes, Fine crackles O2 Device: High Flow Nasal Cannula O2 Flow Rate (L/min): 5 L/min    R Recommendations: See Admitting Provider Note  Report given to:   Additional Notes:

## 2023-10-01 NOTE — Progress Notes (Signed)
Initial Nutrition Assessment  DOCUMENTATION CODES:   Severe malnutrition in context of chronic illness  INTERVENTION:  Initiate tube feeding via PEG: Osmolite 1.5 at 55 ml/h (1320 ml per day) Start bolus feed at 84ml/hr increase rate by 10ml every 4 hours till goal rate of 51ml/hr is met.  Prosource TF20 60  ml daily  Provides 2060 kcal, 103 gm protein, 1005 ml free water daily  FWF every 4 hrs (975ml/day)  Monitor magnesium, potassium, and phosphorus BID for at least 3 days, MD to replete as needed, as pt is at risk for refeeding syndrome given severe malnutrition.  Home bolus feeding:  Osmolite 1.5 Bolus feeding of 1.5 carton ( ) four times day. FWF before and after each feeding   NUTRITION DIAGNOSIS:   Severe Malnutrition related to chronic illness as evidenced by severe fat depletion, severe muscle depletion.    GOAL:   Patient will meet greater than or equal to 90% of their needs    MONITOR:   TF tolerance, Weight trends  REASON FOR ASSESSMENT:   Consult Enteral/tube feeding initiation and management  ASSESSMENT:    82 y.o. M presented f for evaluation of nausea, vomiting and abd pain. admitted aspiration in to airway, PEG tube tip in the duodenum. PMH: male pharyngeal dysphagia, s/p PEG tube, anemia, recurrent aspiration pneumonia, aneurysm, recurrent falls. Pt in room RN  at bed side assisting pt. He stated that home regimen for TF is bolus, 1 carton 4 x day, Suspect this was not meeting needs as Pt severely malnourished with noted weight decline. He stated that he tried increasing to 5 x day but just felt as though all he was doing was feeding himself.  We discussed implementing an extra carton by splitting carton between 2 feedings he was in agreement with this. Explained while he is in the hospital I would like to put him on continuous feedings and he was in agreement with this plan also.  He stated that does his own feedings.   Admit weight:  52.8 kg Current weight: 52.8 kg  10/01/23 59.7 kg  09/03/23 59.7 kg  08/20/23 58.7 kg  08/19/23 58.8 kg  07/24/23 57.5 kg  03/26/23 56.7 kg  03/24/23 60.8 kg  03/07/23 59.8 kg  02/26/23 61.2 kg  02/21/23 58.5 kg      Average Meal Intake: NPO  Reviewed for Nutritionally Relevant Medications:   Labs Reviewed:  CBG ranges from 109-127 mg/dL over the last 24 hours    NUTRITION - FOCUSED PHYSICAL EXAM:  Flowsheet Row Most Recent Value  Orbital Region Severe depletion  Upper Arm Region Severe depletion  Thoracic and Lumbar Region Severe depletion  Buccal Region Severe depletion  Temple Region Severe depletion  Clavicle Bone Region Severe depletion  Clavicle and Acromion Bone Region Severe depletion  Scapular Bone Region Severe depletion  Dorsal Hand Severe depletion  Patellar Region Severe depletion  Anterior Thigh Region Severe depletion  Posterior Calf Region Severe depletion  Edema (RD Assessment) None  Hair Reviewed  Eyes Reviewed  Mouth Reviewed  Skin Reviewed  Nails Reviewed       Diet Order:   Diet Order             Diet NPO time specified  Diet effective now                   EDUCATION NEEDS:   Education needs have been addressed  Skin:  Skin Assessment: Reviewed RN Assessment  Last BM:  PTA  Height:   Ht Readings from Last 1 Encounters:  10/01/23 5\' 10"  (1.778 m)    Weight:   Wt Readings from Last 1 Encounters:  10/01/23 52.8 kg    Ideal Body Weight:     BMI:  Body mass index is 16.7 kg/m.  Estimated Nutritional Needs:   Kcal:  1088-2100 kcal/day  Protein:  80-90 g/day  Fluid:  63ml/kcal    Jamelle Haring RDN, LDN Clinical Dietitian  RDN pager # available on Amion

## 2023-10-01 NOTE — H&P (Signed)
History and Physical    Brian Dunn ZOX:[id number] DOB: 1941/05/05 DOA: 09/30/2023  PCP: Annamaria Helling, DO   Patient coming from: Home   Chief Complaint: Abdominal pain, N/V, cough, SOB   HPI: Brian Dunn is a 82 y.o. male with medical history significant for hypertension, hyperlipidemia, CAD, and dysphagia with PEG tube who presents with abdominal pain, nausea, vomiting, cough, and shortness of breath.  Patient has been experiencing abdominal pain, nausea, and vomiting.  He had a CT at an outside hospital, was told that the PEG tube tip was in the duodenum, and was advised to seek care at [facility] where the tube was placed.  Patient states that he feels like he aspirated, has had progressive dyspnea, and productive cough.    ED Course: Upon arrival to the ED, patient is found to be afebrile with saturation in the upper 90s on high flow nasal cannula, normal heart rate, and stable blood pressure.  CMP is essentially normal, CBC notable for mild normocytic anemia, lactic acid reassuringly normal, and troponin normal x 2.  CT demonstrates extensive airway impaction on the right which could be from aspiration, and gastrostomy catheter tip in postpyloric duodenal bulb with fluid distended stomach.   PEG tube was retracted 5 cm by the ED physician, patient was started on IV Protonix, and treated with fentanyl, Zofran, vancomycin, aztreonam, and Flagyl.  Review of Systems:  All other systems reviewed and apart from HPI, are negative.  Past Medical History:  Diagnosis Date   Acute bronchitis 05/29/2012   Allergic rhinitis    Anemia in chronic kidney disease 02/27/2021   Aneurysm of right subclavian artery (HCC) 02/07/2023   Angina pectoris (HCC) 11/07/2020   Angina, class III (HCC) 11/07/2020   Asthma 06/18/2012   Followed in Pulmonary clinic/ [facility]/ [place]  - 01/09/2018  After extensive coaching inhaler device  effectiveness =   75% > continue symb 160  2bid and return for pfts in 3 months ? RA bronchiolitis?  - PFT's  04/23/2018  FEV1 2.84 (91 % ) ratio 73  p no % improvement from saba p symb 160 prior to study with DLCO  76 % corrects to 79  % for alv volume   - 04/23/2018  After extensive coaching   Benign essential hypertension 06/20/2016   Bilateral inguinal hernia without obstruction or gangrene 06/20/2016   CAD (coronary artery disease), native coronary artery 06/20/2016   Chest pain, unspecified    Chronic ischemic heart disease, unspecified 01/28/2022   Chronic kidney disease 01/28/2022   Closed fracture of part of neck of femur (HCC) 01/22/2007   Annotation: right-sided status post surgery  Qualifier: Diagnosis of   By: Drue Novel MD, Nolon Rod     IMO SNOMED Dx Update Oct 2024     COPD (chronic obstructive pulmonary disease) (HCC)    Coronary artery disease involving native coronary artery of native heart with angina pectoris (HCC) 06/20/2016   Cough 03/25/2012   Followed in Pulmonary clinic/ [facility]/ [place]    - Trial off acei again  03/25/2012    - Sinus CT 01/24/11 There is mucosal thickening within the paranasal sinuses without  air-fluid levels. Neither ostiomeatal unit is patent.    Cough variant asthma 10/18/2010   Followed in Pulmonary clinic/ [facility]/ [place]  -HFA 75% p coaching 01/22/2011  -PFT's  02/04/2011 minimal airflow obst, nl dlco    Disorder of bone and cartilage 01/22/2007   Qualifier: Diagnosis of  By:  Paz MD, Nolon Rod.   Overview:  Overview:  Qualifier: Diagnosis of  By: Drue Novel MD, Jose E.   Dysphagia 12/27/2022   Onset ? Aug 2022 p febrile illness ? Asp pna   -  MBS 01/13/23   PO Diet Recommendation: Regular; Thin liquids (Level 0)  Liquid Administration via: Cup; Spoon; Straw  Supervision: Patient able to self-feed  Postural changes: -- (try reclining when sipping liquids)  Oral care recommendations: Oral care QID (4x/day); Oral care before ice   chips/water  Recommended consults: Consider ENT consultation    Encounter for fitting and adjustment of hearing aid 01/29/2022   Esophageal dysmotility 03/15/2023   Essential hypertension 11/06/2017   GERD 01/22/2007   Annotation: history of esophageal stricture Qualifier: Diagnosis of  By: Drue Novel MD, Nolon Rod.    GERD (gastroesophageal reflux disease)    Gout 01/28/2022   Hip fracture (HCC)    Hyperlipidemia 06/20/2016   Hyperlipidemia with target LDL less than 70 06/20/2016   Hypertension    HYPOGONADISM 02/18/2007   Qualifier: Diagnosis of  By: Drue Novel MD, Nolon Rod.    Hypogonadism male    Kommerell's diverticulum 03/11/2023   Low back pain 09/09/2013   Multiple lung nodules on CT 11/21/2015   CT [place] 10/17/15 mpns > 15 y since quit smoking > rec 12 m f/u as this is low risk > done 06/04/16 no change rec recheck in 36m  - CT [place] 12/08/17 no change nodules > meets benign criteria > no directed f/u - Quantiferon GOLD TB 01/09/18 neg    Osteopenia    Personal history of malignant neoplasm of prostate    Postoperative visit 08/14/2016   Preop cardiovascular exam 02/26/2023   Protein-calorie malnutrition, severe 03/14/2023   Sensorineural hearing loss, bilateral 01/28/2022   Syncope 03/27/2023   Tremor, essential 02/18/2007   Overview:  Overview:  Qualifier: Diagnosis of  By: Drue Novel MD, Nolon Rod.  Last Assessment & Plan:  Reviewed tremor may increase on dulera so will need to be balanced against benefits to cough    Past Surgical History:  Procedure Laterality Date   CAROTID-SUBCLAVIAN BYPASS GRAFT Right 03/11/2023   Procedure: RIGHT SUBCLAVIAN-CAROTID TRANSPOSITION;  Surgeon: Maeola Harman, MD;  Location: [facility] OR;  Service: Vascular;  Laterality: Right;   CORONARY ANGIOPLASTY WITH STENT PLACEMENT  11/04/2006   CORONARY STENT INTERVENTION N/A 11/16/2020   Procedure: CORONARY STENT INTERVENTION;  Surgeon: Marykay Lex, MD;  Location: [facility] INVASIVE CV LAB;  Service: Cardiovascular;  Laterality: N/A;   EYE SURGERY Bilateral 11/2022   cataract removal    INSERTION OF ILIAC STENT Left 03/11/2023   Procedure: INSERTION OF LEFT ILIAC STENT USING VIABAHN VBX X 54. STENT;  Surgeon: Maeola Harman, MD;  Location: [facility] OR;  Service: Vascular;  Laterality: Left;   INSERTION PROSTATE RADIATION SEED     IR REPLC GASTRO/COLONIC TUBE PERCUT W/FLUORO  09/04/2023   LEFT HEART CATH AND CORONARY ANGIOGRAPHY N/A 11/16/2020   Procedure: LEFT HEART CATH AND CORONARY ANGIOGRAPHY;  Surgeon: Marykay Lex, MD;  Location: [facility] INVASIVE CV LAB;  Service: Cardiovascular;  Laterality: N/A;   PEG PLACEMENT N/A 03/19/2023   Procedure: PERCUTANEOUS ENDOSCOPIC GASTROSTOMY (PEG) PLACEMENT;  Surgeon: Diamantina Monks, MD;  Location: [facility] OR;  Service: General;  Laterality: N/A;   THORACIC AORTIC ENDOVASCULAR STENT GRAFT Bilateral 03/11/2023   Procedure: THORACIC AORTIC ENDOVASCULAR STENT GRAFT USING ZENITH ALPHA X STENT (COOK);  Surgeon: Maeola Harman, MD;  Location: [facility] OR;  Service: Vascular;  Laterality: Bilateral;   ULTRASOUND GUIDANCE FOR VASCULAR ACCESS Bilateral 03/11/2023   Procedure: ULTRASOUND GUIDANCE FOR VASCULAR ACCESS;  Surgeon: Maeola Harman, MD;  Location: [facility] OR;  Service: Vascular;  Laterality: Bilateral;    Social History:   reports that he quit smoking about 26 years ago. His smoking use included cigarettes. He started smoking about 61 years ago. He has a 35 pack-year smoking history. He has never used smokeless tobacco. He reports that he does not currently use alcohol. He reports that he does not use drugs.  Allergies  Allergen Reactions   Metoprolol Other (See Comments)    Weight gain   Penicillins Swelling   Morphine Rash    Family History  Problem Relation Age of Onset   Heart disease Mother    Liver disease Mother    Parkinsonism Father    Asthma Father    Throat cancer Sister    Liver cancer Sister    Diabetes Sister    Asthma Sister    Liver cancer Sister    Heart disease Sister    Colon  cancer Neg Hx      Prior to Admission medications   Medication Sig Start Date End Date Taking? Authorizing Provider  allopurinol (ZYLOPRIM) 100 MG tablet Place 100 mg into feeding tube daily. 09/13/13  Yes [provider]  aspirin 81 MG chewable tablet Place 81 mg into feeding tube daily.   Yes [provider]  celecoxib (CELEBREX) 200 MG capsule Place 200 mg into feeding tube daily.   Yes [provider]  Cyanocobalamin (VITAMIN B12 PO) Give 5 mg by tube daily.   Yes [provider]  docusate (COLACE) 50 MG/5ML liquid Place 10 mLs (100 mg total) into feeding tube daily. 03/25/23  Yes Rhyne, Ames Coupe, PA-C  famotidine (PEPCID) 40 MG tablet Place 1 tablet (40 mg total) into feeding tube daily. 03/25/23  Yes Rhyne, Ames Coupe, PA-C  Ferrous Sulfate (IRON) 325 (65 Fe) MG TABS 65 mg by Feeding Tube route daily.   Yes [provider]  finasteride (PROSCAR) 5 MG tablet 5 mg. Place 1 tablet into the feeding tube daily 08/19/23  Yes [provider]  fluticasone (FLONASE) 50 MCG/ACT nasal spray Place 2 sprays into the nose daily. 12/01/10  Yes [provider]  loratadine (CLARITIN) 10 MG tablet Place 1 tablet (10 mg total) into feeding tube daily. 03/24/23  Yes Rhyne, Ames Coupe, PA-C  MAGNESIUM PO 30 mLs by Feeding Tube route daily.   Yes [provider]  mirtazapine (REMERON) 15 MG tablet Place 15 mg into feeding tube at bedtime.   Yes [provider]  Multiple Vitamin (MULTIVITAMIN) capsule Place 1 capsule into feeding tube daily. 03/24/23  Yes Rhyne, Ames Coupe, PA-C  nitroGLYCERIN (NITROSTAT) 0.4 MG SL tablet Place 1 tablet (0.4 mg total) under the tongue every 5 (five) minutes as needed for chest pain. 01/29/22  Yes Revankar, Aundra Dubin, MD  Nutritional Supplements (FEEDING SUPPLEMENT, OSMOLITE 1.5 CAL,) LIQD Place 237 mLs into feeding tube 5 (five) times daily.   Yes [provider]  pravastatin (PRAVACHOL) 40 MG  tablet Take 1 tablet (40 mg total) by mouth daily. Patient taking differently: Place 40 mg into feeding tube daily. 04/01/23  Yes Revankar, Aundra Dubin, MD  primidone (MYSOLINE) 50 MG tablet Place 1 tablet (50 mg total) into feeding tube at bedtime. 03/24/23  Yes Rhyne, Samantha J, PA-C  senna-docusate (SENOKOT-S) 8.6-50 MG tablet Place 1 tablet into  feeding tube at bedtime as needed for mild constipation. 03/24/23  Yes Rhyne, Ames Coupe, PA-C  traMADol (ULTRAM) 50 MG tablet Place 1 tablet (50 mg total) into feeding tube every 6 (six) hours as needed for moderate pain. 03/24/23  Yes Rhyne, Ames Coupe, PA-C  budesonide-formoterol (SYMBICORT) 160-4.5 MCG/ACT inhaler Inhale 2 puffs into the lungs daily.    [provider]    Physical Exam: Vitals:   09/30/23 2115 10/01/23 0000 10/01/23 0130 10/01/23 0200  BP: (!) 169/80  (!) 161/78 (!) 169/74  Pulse: 87  98 97  Resp: 17  (!) 26 (!) 26  Temp: 98.4 F (36.9 C)     TempSrc: Oral     SpO2: 100%  100% 97%  Weight:  59.7 kg    Height:  5\' 10"  (1.778 m)      Constitutional: NAD, cachectic  Eyes: PERTLA, lids and conjunctivae normal ENMT: Mucous membranes are moist. Posterior pharynx clear of any exudate or lesions.   Neck: supple, no masses  Respiratory: Coarse rhonchi bilaterally, no wheezing. Dyspneic with speech.    Cardiovascular: S1 & S2 heard, regular rate and rhythm. No extremity edema.  Abdomen: Soft, no tenderness. Bowel sounds active.  Musculoskeletal: no clubbing / cyanosis. No joint deformity upper and lower extremities.   Skin: no significant rashes, lesions, ulcers. Warm, dry, well-perfused. Neurologic: CN 2-12 grossly intact. Moving all extremities. Alert and oriented.  Psychiatric: Pleasant. Cooperative.    Labs and Imaging on Admission: I have personally reviewed following labs and imaging studies  CBC: Recent Labs  Lab 09/30/23 1604  WBC 9.2  HGB 11.4*  HCT 35.0*  MCV 97.8  PLT 201   Basic Metabolic  Panel: Recent Labs  Lab 09/30/23 1604  NA 136  K 4.1  CL 101  CO2 23  GLUCOSE 109*  BUN 23  CREATININE 0.80  CALCIUM 9.6   GFR: Estimated Creatinine Clearance: 59.1 mL/min (by C-G formula based on SCr of 0.8 mg/dL). Liver Function Tests: Recent Labs  Lab 09/30/23 1604  AST 27  ALT 20  ALKPHOS 45  BILITOT 0.8  PROT 7.2  ALBUMIN 3.8   Recent Labs  Lab 09/30/23 1604  LIPASE 29   No results for input(s): "AMMONIA" in the last 168 hours. Coagulation Profile: No results for input(s): "INR", "PROTIME" in the last 168 hours. Cardiac Enzymes: No results for input(s): "CKTOTAL", "CKMB", "CKMBINDEX", "TROPONINI" in the last 168 hours. BNP (last 3 results) No results for input(s): "PROBNP" in the last 8760 hours. HbA1C: No results for input(s): "HGBA1C" in the last 72 hours. CBG: No results for input(s): "GLUCAP" in the last 168 hours. Lipid Profile: No results for input(s): "CHOL", "HDL", "LDLCALC", "TRIG", "CHOLHDL", "LDLDIRECT" in the last 72 hours. Thyroid Function Tests: No results for input(s): "TSH", "T4TOTAL", "FREET4", "T3FREE", "THYROIDAB" in the last 72 hours. Anemia Panel: No results for input(s): "VITAMINB12", "FOLATE", "FERRITIN", "TIBC", "IRON", "RETICCTPCT" in the last 72 hours. Urine analysis:    Component Value Date/Time   COLORURINE YELLOW 10/01/2023 0003   APPEARANCEUR HAZY (A) 10/01/2023 0003   LABSPEC 1.032 (H) 10/01/2023 0003   PHURINE 7.0 10/01/2023 0003   GLUCOSEU NEGATIVE 10/01/2023 0003   HGBUR NEGATIVE 10/01/2023 0003   BILIRUBINUR NEGATIVE 10/01/2023 0003   KETONESUR 20 (A) 10/01/2023 0003   PROTEINUR 30 (A) 10/01/2023 0003   NITRITE NEGATIVE 10/01/2023 0003   LEUKOCYTESUR NEGATIVE 10/01/2023 0003   Sepsis Labs: @LABRCNTIP (procalcitonin:4,lacticidven:4) )No results found for this or any previous visit (from the past 240  hour(s)).   Radiological Exams on Admission: DG Abd Portable 1V  Result Date: 10/01/2023 CLINICAL DATA:  G-tube  repositioning post CT EXAM: PORTABLE ABDOMEN - 1 VIEW COMPARISON:  CT abdomen and pelvis 09/30/2023 FINDINGS: Enteric contrast injected through the gastrostomy tube is present within the first and second portions of the duodenum. No contrast is visualized within the stomach. The balloon catheter is not outlined by contrast. No evidence of bowel obstruction. Contrast from prior studies within the bladder and rectum. Left common iliac artery stent. Screw in the posterior right acetabulum. IMPRESSION: Enteric contrast injected through the G-tube is within the first and second portions of the duodenum. No enteric contrast within the stomach. Electronically Signed   By: Minerva Fester M.D.   On: 10/01/2023 03:12   DG Chest Port 1 View  Result Date: 10/01/2023 CLINICAL DATA:  Aspiration EXAM: PORTABLE CHEST 1 VIEW COMPARISON:  CT 09/30/2023, 03/26/2023 FINDINGS: Coronary stent. Stent graft at the aortic arch. Right infrahilar bandlike opacity, corresponding to CT demonstrated right lower lobe atelectasis stable cardiomediastinal silhouette. No pneumothorax. Old rib fractures IMPRESSION: Right infrahilar bandlike opacity, corresponding to CT demonstrated right lower lobe atelectasis. Electronically Signed   By: Jasmine Pang M.D.   On: 10/01/2023 01:38   CT ABDOMEN PELVIS W CONTRAST  Result Date: 10/01/2023 CLINICAL DATA:  Acute nonlocalized abdominal pain EXAM: CT ABDOMEN AND PELVIS WITH CONTRAST TECHNIQUE: Multidetector CT imaging of the abdomen and pelvis was performed using the standard protocol following bolus administration of intravenous contrast. RADIATION DOSE REDUCTION: This exam was performed according to the departmental dose-optimization program which includes automated exposure control, adjustment of the mA and/or kV according to patient size and/or use of iterative reconstruction technique. CONTRAST:  75mL OMNIPAQUE IOHEXOL 350 MG/ML SOLN COMPARISON:  09/29/2023 FINDINGS: Lower chest: There has  developed extensive airway impaction within the right middle and lower lobes with discoid atelectasis within the right lower lobe. No focal consolidation. No pleural effusion. Extensive multi-vessel coronary artery calcification. Hepatobiliary: No focal liver abnormality is seen. No gallstones, gallbladder wall thickening, or biliary dilatation. Pancreas: Unremarkable Spleen: Unremarkable Adrenals/Urinary Tract: Adrenal glands are unremarkable. Kidneys are normal, without renal calculi, focal lesion, or hydronephrosis. Bladder is unremarkable. Stomach/Bowel: Balloon retention gastrostomy catheter is again seen with its tip within the post pyloric duodenal bulb. The stomach is moderately fluid distended suggesting at least some degree of gastric outlet obstruction. Severe sigmoid diverticulosis. Stomach, small bowel, large bowel are otherwise unremarkable. Appendix normal. No free intraperitoneal gas or fluid. Vascular/Lymphatic: Extensive aortoiliac atherosclerotic calcification. Vascular stent seen within the left common and external iliac arteries with wide patency of the stented segment. No pathologic adenopathy within the abdomen and pelvis. Reproductive: Brachytherapy seeds are seen within the prostate gland Other: No abdominal wall hernia Musculoskeletal: Osseous structures are diffusely osteopenic. Remote L1 compression deformity again noted. Advanced degenerative changes are seen throughout the lumbar spine. Right posterior acetabular wall ORIF has been performed. No acute bone abnormality. No lytic or blastic bone lesion. IMPRESSION: 1. Interval development of extensive airway impaction within the right middle and lower lobes with discoid atelectasis within the right lower lobe. Given findings within the stomach, this may relate to aspiration. 2. Extensive multi-vessel coronary artery calcification. 3. Balloon retention gastrostomy catheter with its tip within the post pyloric duodenal bulb. The stomach is  moderately fluid distended suggesting at least some degree of gastric outlet obstruction. Repositioning is recommended. 4. Severe sigmoid diverticulosis without superimposed acute inflammatory change. Aortic Atherosclerosis (ICD10-I70.0). Electronically Signed  By: Helyn Numbers M.D.   On: 10/01/2023 00:18   DG Chest 2 View  Result Date: 09/30/2023 CLINICAL DATA:  Dyspnea EXAM: CHEST - 2 VIEW COMPARISON:  06/26/2023 FINDINGS: Discoid atelectasis right base. Lungs are otherwise clear. No pneumothorax or pleural effusion. Cardiac size within limits. The stent graft noted within the aortic arch unchanged. Healed bilateral rib fractures noted. No acute bone. IMPRESSION: 1. Right basilar discoid atelectasis. Electronically Signed   By: Helyn Numbers M.D.   On: 09/30/2023 21:48    EKG: Independently reviewed. Sinus rhythm, PACs, LAD.   Assessment/Plan   1. Aspiration; acute hypoxic respiratory failure  - Pt with dysphagia and hx of aspiration p/w productive cough and SOB after vomiting and is found to be hypoxic with coarse rhonchi   - Culture sputum, continue antibiotics, continue supplemental O2, supportive care, NPO    2. GI bleeding; PEG tube malfunction  - Presents with abdominal pain and N/V, found to have PEG balloon inflated in duodenal bulb with GOO, has relief in pain and nausea with repositioning of PEG tube  - Dark liquid suctioned from PEG, Gastroccult positive for blood  - Continue IV PPI, type & screen, trend H&H, bowel rest for now   3. Hypertension  - Treat as-needed only for now    4. Asthma  - Continue ICS-LABA and as-needed albuterol     DVT prophylaxis: SCDs  Code Status: DNR  Level of Care: Level of care: Progressive Family Communication: None present  Disposition Plan:  Patient is from: home  Anticipated d/c is to: TBD Anticipated d/c date is: 10/05/23  Patient currently: Pending stable H&H, improved respiratory status  Consults called: None  Admission status:  Inpatient     Briscoe Deutscher, MD [facility]  10/01/2023, 3:41 AM

## 2023-10-01 NOTE — Progress Notes (Signed)
PROGRESS NOTE    Brian Dunn  BJY:[id number] DOB: 1941/01/01 DOA: 09/30/2023 PCP: Annamaria Helling, DO    Chief Complaint  Patient presents with   Gastric Tube Problem    Brief Narrative:   Brian Dunn is a 82 y.o. male with medical history significant for hypertension, hyperlipidemia, CAD, and dysphagia with PEG tube who presents with abdominal pain, nausea, vomiting, cough, and shortness of breath.   Patient has been experiencing abdominal pain, nausea, and vomiting.  He had a CT at an outside hospital, was told that the PEG tube tip was in the duodenum, and was advised to seek care at [facility] where the tube was placed.  Patient states that he feels like he aspirated, has had progressive dyspnea, and productive cough.     ED Course: Upon arrival to the ED, patient is found to be afebrile with saturation in the upper 90s on high flow nasal cannula, normal heart rate, and stable blood pressure.  CMP is essentially normal, CBC notable for mild normocytic anemia, lactic acid reassuringly normal, and troponin normal x 2.  CT demonstrates extensive airway impaction on the right which could be from aspiration, and gastrostomy catheter tip in postpyloric duodenal bulb with fluid distended stomach.    PEG tube was retracted 5 cm by the ED physician, patient was started on IV Protonix, and treated with fentanyl, Zofran, vancomycin, aztreonam, and Flagyl.   Assessment & Plan:   Principal Problem:   Aspiration into airway, initial encounter Active Problems:   Dysphagia   Hypertension   Cough variant asthma   GI bleeding   Acute respiratory failure with hypoxia (HCC)  Acute respiratory failure with hypoxia Aspiration - Pt with dysphagia and hx of aspiration p/w productive cough and SOB after vomiting and is found to be hypoxic with coarse rhonchi   -Continue with IV antibiotic -N.p.o. for now, all feeding and meds via PEG -Change IV antibiotic to  Unasyn -Incentive spirometer, flutter valve, as well requested RT to evaluate for chest physiotherapy given significant aspiration on imaging   Tube malposition -IR input greatly appreciated, tube was exchanged today  Heme occult positive stool -Is most likely due to PEG malposition, BC stable, no evidence of significant GI bleed -Continue with PPI   Hypertension  - Treat as-needed only for now we will keep on.  Hydralazine   Asthma  - Continue ICS-LABA and as-needed albuterol      High grade stenosis of basilar artery. -Resume aspirin when stable -Continue with statin  -Avoid orthostasis   History of Kommerell's diverticulum:  - S/p EVAR by vasc surgery last April with R subclavian,carotid transposition.     Dysphagia:  -Due to Kommerell's diverticulum.  Previous record only sips of water, but all feeding and meds via PEG  Hyperlipidemia -Continue with statin  Severe PCM -BMI of 16.7, nutritionist consulted       DVT prophylaxis: SCD Code Status: DNR Family Communication: None at bedside Disposition:   Status is: Inpatient    Consultants:  IR   Subjective:  Patient had his PEG tube replaced earlier today by IR  Objective: Vitals:   10/01/23 0200 10/01/23 0400 10/01/23 0800 10/01/23 1231  BP: (!) 169/74 (!) 163/80 (!) 159/68   Pulse: 97 90 90   Resp: (!) 26 20 (!) 24   Temp:   98.1 F (36.7 C)   TempSrc:   Axillary   SpO2: 97% 98% 96%   Weight:    52.8 kg  Height:        Intake/Output Summary (Last 24 hours) at 10/01/2023 1345 Last data filed at 10/01/2023 0222 Gross per 24 hour  Intake 200.23 ml  Output 800 ml  Net -599.77 ml   Filed Weights   10/01/23 0000 10/01/23 1231  Weight: 59.7 kg 52.8 kg    Examination:  Awake Alert, Oriented X 3, trembly frail, cachectic, heart respiratory sound Symmetrical Chest wall movement, bibasilar respiratory Rales, mildly tachypneic RRR,No Gallops,Rubs or new Murmurs, No Parasternal Heave +ve  B.Sounds, Abd Soft, No tenderness, PEG present No Cyanosis, Clubbing or edema, No new Rash or bruise       Data Reviewed: I have personally reviewed following labs and imaging studies  CBC: Recent Labs  Lab 09/30/23 1604 10/01/23 0426 10/01/23 1229  WBC 9.2 10.8* 14.5*  HGB 11.4* 12.4* 12.2*  HCT 35.0* 36.6* 36.2*  MCV 97.8 96.1 98.4  PLT 201 186 194    Basic Metabolic Panel: Recent Labs  Lab 09/30/23 1604 10/01/23 0426  NA 136 141  K 4.1 4.1  CL 101 102  CO2 23 24  GLUCOSE 109* 127*  BUN 23 29*  CREATININE 0.80 0.85  CALCIUM 9.6 9.5    GFR: Estimated Creatinine Clearance: 49.2 mL/min (by C-G formula based on SCr of 0.85 mg/dL).  Liver Function Tests: Recent Labs  Lab 09/30/23 1604  AST 27  ALT 20  ALKPHOS 45  BILITOT 0.8  PROT 7.2  ALBUMIN 3.8    CBG: No results for input(s): "GLUCAP" in the last 168 hours.   No results found for this or any previous visit (from the past 240 hour(s)).       Radiology Studies: DG Abd Portable 1V  Result Date: 10/01/2023 CLINICAL DATA:  G-tube repositioning post CT EXAM: PORTABLE ABDOMEN - 1 VIEW COMPARISON:  CT abdomen and pelvis 09/30/2023 FINDINGS: Enteric contrast injected through the gastrostomy tube is present within the first and second portions of the duodenum. No contrast is visualized within the stomach. The balloon catheter is not outlined by contrast. No evidence of bowel obstruction. Contrast from prior studies within the bladder and rectum. Left common iliac artery stent. Screw in the posterior right acetabulum. IMPRESSION: Enteric contrast injected through the G-tube is within the first and second portions of the duodenum. No enteric contrast within the stomach. Electronically Signed   By: Minerva Fester M.D.   On: 10/01/2023 03:12   DG Chest Port 1 View  Result Date: 10/01/2023 CLINICAL DATA:  Aspiration EXAM: PORTABLE CHEST 1 VIEW COMPARISON:  CT 09/30/2023, 03/26/2023 FINDINGS: Coronary stent.  Stent graft at the aortic arch. Right infrahilar bandlike opacity, corresponding to CT demonstrated right lower lobe atelectasis stable cardiomediastinal silhouette. No pneumothorax. Old rib fractures IMPRESSION: Right infrahilar bandlike opacity, corresponding to CT demonstrated right lower lobe atelectasis. Electronically Signed   By: Jasmine Pang M.D.   On: 10/01/2023 01:38   CT ABDOMEN PELVIS W CONTRAST  Result Date: 10/01/2023 CLINICAL DATA:  Acute nonlocalized abdominal pain EXAM: CT ABDOMEN AND PELVIS WITH CONTRAST TECHNIQUE: Multidetector CT imaging of the abdomen and pelvis was performed using the standard protocol following bolus administration of intravenous contrast. RADIATION DOSE REDUCTION: This exam was performed according to the departmental dose-optimization program which includes automated exposure control, adjustment of the mA and/or kV according to patient size and/or use of iterative reconstruction technique. CONTRAST:  75mL OMNIPAQUE IOHEXOL 350 MG/ML SOLN COMPARISON:  09/29/2023 FINDINGS: Lower chest: There has developed extensive airway impaction within the right middle  and lower lobes with discoid atelectasis within the right lower lobe. No focal consolidation. No pleural effusion. Extensive multi-vessel coronary artery calcification. Hepatobiliary: No focal liver abnormality is seen. No gallstones, gallbladder wall thickening, or biliary dilatation. Pancreas: Unremarkable Spleen: Unremarkable Adrenals/Urinary Tract: Adrenal glands are unremarkable. Kidneys are normal, without renal calculi, focal lesion, or hydronephrosis. Bladder is unremarkable. Stomach/Bowel: Balloon retention gastrostomy catheter is again seen with its tip within the post pyloric duodenal bulb. The stomach is moderately fluid distended suggesting at least some degree of gastric outlet obstruction. Severe sigmoid diverticulosis. Stomach, small bowel, large bowel are otherwise unremarkable. Appendix normal. No free  intraperitoneal gas or fluid. Vascular/Lymphatic: Extensive aortoiliac atherosclerotic calcification. Vascular stent seen within the left common and external iliac arteries with wide patency of the stented segment. No pathologic adenopathy within the abdomen and pelvis. Reproductive: Brachytherapy seeds are seen within the prostate gland Other: No abdominal wall hernia Musculoskeletal: Osseous structures are diffusely osteopenic. Remote L1 compression deformity again noted. Advanced degenerative changes are seen throughout the lumbar spine. Right posterior acetabular wall ORIF has been performed. No acute bone abnormality. No lytic or blastic bone lesion. IMPRESSION: 1. Interval development of extensive airway impaction within the right middle and lower lobes with discoid atelectasis within the right lower lobe. Given findings within the stomach, this may relate to aspiration. 2. Extensive multi-vessel coronary artery calcification. 3. Balloon retention gastrostomy catheter with its tip within the post pyloric duodenal bulb. The stomach is moderately fluid distended suggesting at least some degree of gastric outlet obstruction. Repositioning is recommended. 4. Severe sigmoid diverticulosis without superimposed acute inflammatory change. Aortic Atherosclerosis (ICD10-I70.0). Electronically Signed   By: Helyn Numbers M.D.   On: 10/01/2023 00:18   DG Chest 2 View  Result Date: 09/30/2023 CLINICAL DATA:  Dyspnea EXAM: CHEST - 2 VIEW COMPARISON:  06/26/2023 FINDINGS: Discoid atelectasis right base. Lungs are otherwise clear. No pneumothorax or pleural effusion. Cardiac size within limits. The stent graft noted within the aortic arch unchanged. Healed bilateral rib fractures noted. No acute bone. IMPRESSION: 1. Right basilar discoid atelectasis. Electronically Signed   By: Helyn Numbers M.D.   On: 09/30/2023 21:48        Scheduled Meds:  feeding supplement (PROSource TF20)  60 mL Per Tube Daily   free water   165 mL Per Tube Q4H   mometasone-formoterol  2 puff Inhalation BID   pantoprazole (PROTONIX) IV  40 mg Intravenous Q6H   Followed by   Melene Muller ON 10/04/2023] pantoprazole (PROTONIX) IV  40 mg Intravenous Q12H   sodium chloride flush  3 mL Intravenous Q12H   Continuous Infusions:  ampicillin-sulbactam (UNASYN) IV 3 g (10/01/23 0930)   feeding supplement (OSMOLITE 1.5 CAL)     lactated ringers 75 mL/hr at 10/01/23 0428     LOS: 0 days       Huey Bienenstock, MD [facility]   To contact the attending provider between 7A-7P or the covering provider during after hours 7P-7A, please log into the web site [email or web address] and access using universal [place] password for that web site. If you do not have the password, please call the hospital operator.  10/01/2023, 1:45 PM

## 2023-10-02 DIAGNOSIS — T17908A Unspecified foreign body in respiratory tract, part unspecified causing other injury, initial encounter: Secondary | ICD-10-CM

## 2023-10-02 LAB — CBC
HCT: 30.6 % — ABNORMAL LOW (ref 39.0–52.0)
HCT: 32 % — ABNORMAL LOW (ref 39.0–52.0)
Hemoglobin: 10 g/dL — ABNORMAL LOW (ref 13.0–17.0)
Hemoglobin: 10.2 g/dL — ABNORMAL LOW (ref 13.0–17.0)
MCH: 32.6 pg (ref 26.0–34.0)
MCH: 32.3 pg (ref 26.0–34.0)
MCHC: 32.7 g/dL (ref 30.0–36.0)
MCHC: 31.9 g/dL (ref 30.0–36.0)
MCV: 99.7 fL (ref 80.0–100.0)
MCV: 101.3 fL — ABNORMAL HIGH (ref 80.0–100.0)
Platelets: 151 10*3/uL (ref 150–400)
Platelets: 155 10*3/uL (ref 150–400)
RBC: 3.07 MIL/uL — ABNORMAL LOW (ref 4.22–5.81)
RBC: 3.16 MIL/uL — ABNORMAL LOW (ref 4.22–5.81)
RDW: 14.4 % (ref 11.5–15.5)
RDW: 14.5 % (ref 11.5–15.5)
WBC: 9.3 10*3/uL (ref 4.0–10.5)
WBC: 10.2 10*3/uL (ref 4.0–10.5)
nRBC: 0 % (ref 0.0–0.2)
nRBC: 0 % (ref 0.0–0.2)

## 2023-10-02 LAB — GLUCOSE, CAPILLARY
Glucose-Capillary: 120 mg/dL — ABNORMAL HIGH (ref 70–99)
Glucose-Capillary: 139 mg/dL — ABNORMAL HIGH (ref 70–99)
Glucose-Capillary: 146 mg/dL — ABNORMAL HIGH (ref 70–99)
Glucose-Capillary: 146 mg/dL — ABNORMAL HIGH (ref 70–99)
Glucose-Capillary: 93 mg/dL (ref 70–99)
Glucose-Capillary: 94 mg/dL (ref 70–99)

## 2023-10-02 LAB — MAGNESIUM
Magnesium: 2.1 mg/dL (ref 1.7–2.4)
Magnesium: 2.1 mg/dL (ref 1.7–2.4)

## 2023-10-02 LAB — BASIC METABOLIC PANEL
Anion gap: 8 (ref 5–15)
BUN: 36 mg/dL — ABNORMAL HIGH (ref 8–23)
CO2: 28 mmol/L (ref 22–32)
Calcium: 8.8 mg/dL — ABNORMAL LOW (ref 8.9–10.3)
Chloride: 104 mmol/L (ref 98–111)
Creatinine, Ser: 0.76 mg/dL (ref 0.61–1.24)
GFR, Estimated: >60 mL/min (ref >60)
Glucose, Bld: 149 mg/dL — ABNORMAL HIGH (ref 70–99)
Potassium: 3 mmol/L — ABNORMAL LOW (ref 3.5–5.1)
Sodium: 140 mmol/L (ref 135–145)

## 2023-10-02 LAB — PHOSPHORUS
Phosphorus: 2.8 mg/dL (ref 2.5–4.6)
Phosphorus: 2.6 mg/dL (ref 2.5–4.6)

## 2023-10-02 MED ORDER — POTASSIUM CHLORIDE 20 MEQ PO PACK
40.0000 meq | PACK | Freq: Four times a day (QID) | ORAL | Status: AC
  Administered 2023-10-02 (×2): 40 meq
  Filled 2023-10-02 (×2): qty 2

## 2023-10-02 MED ORDER — ADULT MULTIVITAMIN LIQUID CH
15.0000 mL | Freq: Every day | ORAL | Status: DC
  Administered 2023-10-02 – 2023-10-03 (×2): 15 mL
  Filled 2023-10-02 (×3): qty 15

## 2023-10-02 MED ORDER — THIAMINE MONONITRATE 100 MG PO TABS
200.0000 mg | ORAL_TABLET | Freq: Every day | ORAL | Status: DC
  Administered 2023-10-02 – 2023-10-04 (×3): 200 mg
  Filled 2023-10-02 (×3): qty 2

## 2023-10-02 NOTE — Progress Notes (Signed)
Patient ID: Brian Dunn [facility], male   DOB: 1941/06/23, 82 y.o.   MRN: [id number]   IR Note  11/27 IR procedure:  Image guided exchange of gastrostomy, with placement of the patient's personalized low-profile Mickey button 24 French 2.5 cm stomal length gastrostomy.  Pt says is "leaking" per RN  I have seen pt Area at site is moist No avert leaking I can see I did place gauze bandage at site Will recheck in am

## 2023-10-02 NOTE — Progress Notes (Addendum)
PROGRESS NOTE    Brian Dunn  ZOX:[id number] DOB: 07-05-1941 DOA: 09/30/2023 PCP: Annamaria Helling, DO    Chief Complaint  Patient presents with   Gastric Tube Problem    Brief Narrative:   Brian Dunn is a 82 y.o. male with medical history significant for hypertension, hyperlipidemia, CAD, and dysphagia with PEG tube who presents with abdominal pain, nausea, vomiting, cough, and shortness of breath.   Patient has been experiencing abdominal pain, nausea, and vomiting.  He had a CT at an outside hospital, was told that the PEG tube tip was in the duodenum, and was advised to seek care at [facility] where the tube was placed.  Patient states that he feels like he aspirated, has had progressive dyspnea, and productive cough.     ED Course: Upon arrival to the ED, patient is found to be afebrile with saturation in the upper 90s on high flow nasal cannula, normal heart rate, and stable blood pressure.  CMP is essentially normal, CBC notable for mild normocytic anemia, lactic acid reassuringly normal, and troponin normal x 2.  CT demonstrates extensive airway impaction on the right which could be from aspiration, and gastrostomy catheter tip in postpyloric duodenal bulb with fluid distended stomach.    PEG tube was retracted 5 cm by the ED physician, patient was started on IV Protonix, and treated with fentanyl, Zofran, vancomycin, aztreonam, and Flagyl.   Assessment & Plan:   Principal Problem:   Aspiration into airway, initial encounter Active Problems:   Dysphagia   Hypertension   Cough variant asthma   GI bleeding   Acute respiratory failure with hypoxia (HCC)  Acute respiratory failure with hypoxia Aspiration - Pt with dysphagia and hx of aspiration p/w productive cough and SOB after vomiting and is found to be hypoxic with coarse rhonchi   -Continue with IV antibiotic -N.p.o. for now, all feeding and meds via PEG -Continue with IV Unasyn, respiratory  status has improved, as well his white blood cell count trending down -Was encouraged to use incentive spirometer, flutter valve, as well requested RT to evaluate for chest physiotherapy given significant aspiration on imaging   PEG tube malposition -IR input greatly appreciated, tube was exchanged 11/27  Heme occult positive stool -Is most likely due to PEG malposition, BC stable, no evidence of significant GI bleed -Continue with PPI   Hypertension  - Treat as-needed only for now we will keep on RN hydralazine   Asthma  - Continue ICS-LABA and as-needed albuterol     High grade stenosis of basilar artery. -Resume aspirin when stable -Continue with statin  -Avoid orthostasis   History of Kommerell's diverticulum:  - S/p EVAR by vasc surgery last April with R subclavian,carotid transposition.     Dysphagia:  -Due to Kommerell's diverticulum.  Previous record only sips of water, but all feeding and meds via PEG  Hyperlipidemia -Continue with statin  Severe PCM -BMI of 16.7, nutritionist consulted  Hypokalemia -Repleted       DVT prophylaxis: SCD Code Status: DNR Family Communication: None at bedside Disposition:   Status is: Inpatient    Consultants:  IR   Subjective:  No significant events overnight as discussed with staff, patient denies any complaints today.  Objective: Vitals:   10/02/23 0003 10/02/23 0400 10/02/23 0800 10/02/23 0856  BP: (!) 132/49 138/62 (!) 96/59   Pulse: 72 65    Resp: 17 20 20    Temp: 98 F (36.7 C)  98.2 F (  36.8 C)   TempSrc: Axillary  Axillary   SpO2: 99% 100%  95%  Weight:      Height:       No intake or output data in the 24 hours ending 10/02/23 1029  Filed Weights   10/01/23 0000 10/01/23 1231  Weight: 59.7 kg 52.8 kg    Examination:  Awake Alert, Oriented X 3, trembly frail, cachectic, Symmetrical Chest wall movement, Good air movement bilaterally, CTAB RRR,No Gallops,Rubs or new Murmurs, No Parasternal  Heave +ve B.Sounds, Abd Soft, No tenderness,PEG+ No Cyanosis, Clubbing or edema, No new Rash or bruise      Data Reviewed: I have personally reviewed following labs and imaging studies  CBC: Recent Labs  Lab 09/30/23 1604 10/01/23 0426 10/01/23 1229 10/01/23 1934 10/02/23 0351  WBC 9.2 10.8* 14.5* 12.0* 9.3  HGB 11.4* 12.4* 12.2* 10.2* 10.0*  HCT 35.0* 36.6* 36.2* 30.9* 30.6*  MCV 97.8 96.1 98.4 96.9 99.7  PLT 201 186 194 169 151    Basic Metabolic Panel: Recent Labs  Lab 09/30/23 1604 10/01/23 0426 10/01/23 1818 10/02/23 0351  NA 136 141  --  140  K 4.1 4.1  --  3.0*  CL 101 102  --  104  CO2 23 24  --  28  GLUCOSE 109* 127*  --  149*  BUN 23 29*  --  36*  CREATININE 0.80 0.85  --  0.76  CALCIUM 9.6 9.5  --  8.8*  MG  --   --  1.9 2.1  PHOS  --   --  2.8 2.8    GFR: Estimated Creatinine Clearance: 52.3 mL/min (by C-G formula based on SCr of 0.76 mg/dL).  Liver Function Tests: Recent Labs  Lab 09/30/23 1604  AST 27  ALT 20  ALKPHOS 45  BILITOT 0.8  PROT 7.2  ALBUMIN 3.8    CBG: Recent Labs  Lab 10/01/23 1809 10/01/23 1944 10/02/23 0002 10/02/23 0345 10/02/23 0803  GLUCAP 103* 123* 139* 146* 146*     No results found for this or any previous visit (from the past 240 hour(s)).       Radiology Studies: IR Replc Gastro/Colonic Tube Percut W/Fluoro  Result Date: 10/01/2023 INDICATION: 82 year old male presents for exchange of indwelling gastrostomy for his personalized low-profile 24 [place], 2.5 stomal length Mickey button gastrostomy EXAM: GASTROSTOMY CATHETER REPLACEMENT MEDICATIONS: None ANESTHESIA/SEDATION: None CONTRAST:  5mL OMNIPAQUE IOHEXOL 300 MG/ML SOLN - administered into the gastric lumen. FLUOROSCOPY: Radiation Exposure Index (as provided by the fluoroscopic device): 1.2 mGy Kerma COMPLICATIONS: None PROCEDURE: Informed written consent was obtained from the patient and the patient's family after a thorough discussion of the  procedural risks, benefits and alternatives. All questions were addressed. Clean technique was utilized including caps, mask, sterile gowns, sterile gloves, sterile drape, hand hygiene and skin antiseptic. A timeout was performed prior to the initiation of the procedure. The indwelling percutaneous gastrostomy was removed after deflating the balloon. The new 24 French 2.5 stomal length Mickey button low-profile gastrostomy was placed through the stoma. Balloon was inflated. Contrast was injected confirming location. Final image was stored. Patient tolerated the procedure well and remained hemodynamically stable throughout. No complications were encountered and no significant blood loss encountered. IMPRESSION: Image guided exchange of gastrostomy, with placement of the patient's personalized low-profile Mickey button 24 French 2.5 cm stomal length gastrostomy. Electronically Signed   By: Gilmer Mor D.O.   On: 10/01/2023 13:52   DG Abd Portable 1V  Result Date: 10/01/2023 CLINICAL  DATA:  G-tube repositioning post CT EXAM: PORTABLE ABDOMEN - 1 VIEW COMPARISON:  CT abdomen and pelvis 09/30/2023 FINDINGS: Enteric contrast injected through the gastrostomy tube is present within the first and second portions of the duodenum. No contrast is visualized within the stomach. The balloon catheter is not outlined by contrast. No evidence of bowel obstruction. Contrast from prior studies within the bladder and rectum. Left common iliac artery stent. Screw in the posterior right acetabulum. IMPRESSION: Enteric contrast injected through the G-tube is within the first and second portions of the duodenum. No enteric contrast within the stomach. Electronically Signed   By: Minerva Fester M.D.   On: 10/01/2023 03:12   DG Chest Port 1 View  Result Date: 10/01/2023 CLINICAL DATA:  Aspiration EXAM: PORTABLE CHEST 1 VIEW COMPARISON:  CT 09/30/2023, 03/26/2023 FINDINGS: Coronary stent. Stent graft at the aortic arch. Right  infrahilar bandlike opacity, corresponding to CT demonstrated right lower lobe atelectasis stable cardiomediastinal silhouette. No pneumothorax. Old rib fractures IMPRESSION: Right infrahilar bandlike opacity, corresponding to CT demonstrated right lower lobe atelectasis. Electronically Signed   By: Jasmine Pang M.D.   On: 10/01/2023 01:38   CT ABDOMEN PELVIS W CONTRAST  Result Date: 10/01/2023 CLINICAL DATA:  Acute nonlocalized abdominal pain EXAM: CT ABDOMEN AND PELVIS WITH CONTRAST TECHNIQUE: Multidetector CT imaging of the abdomen and pelvis was performed using the standard protocol following bolus administration of intravenous contrast. RADIATION DOSE REDUCTION: This exam was performed according to the departmental dose-optimization program which includes automated exposure control, adjustment of the mA and/or kV according to patient size and/or use of iterative reconstruction technique. CONTRAST:  75mL OMNIPAQUE IOHEXOL 350 MG/ML SOLN COMPARISON:  09/29/2023 FINDINGS: Lower chest: There has developed extensive airway impaction within the right middle and lower lobes with discoid atelectasis within the right lower lobe. No focal consolidation. No pleural effusion. Extensive multi-vessel coronary artery calcification. Hepatobiliary: No focal liver abnormality is seen. No gallstones, gallbladder wall thickening, or biliary dilatation. Pancreas: Unremarkable Spleen: Unremarkable Adrenals/Urinary Tract: Adrenal glands are unremarkable. Kidneys are normal, without renal calculi, focal lesion, or hydronephrosis. Bladder is unremarkable. Stomach/Bowel: Balloon retention gastrostomy catheter is again seen with its tip within the post pyloric duodenal bulb. The stomach is moderately fluid distended suggesting at least some degree of gastric outlet obstruction. Severe sigmoid diverticulosis. Stomach, small bowel, large bowel are otherwise unremarkable. Appendix normal. No free intraperitoneal gas or fluid.  Vascular/Lymphatic: Extensive aortoiliac atherosclerotic calcification. Vascular stent seen within the left common and external iliac arteries with wide patency of the stented segment. No pathologic adenopathy within the abdomen and pelvis. Reproductive: Brachytherapy seeds are seen within the prostate gland Other: No abdominal wall hernia Musculoskeletal: Osseous structures are diffusely osteopenic. Remote L1 compression deformity again noted. Advanced degenerative changes are seen throughout the lumbar spine. Right posterior acetabular wall ORIF has been performed. No acute bone abnormality. No lytic or blastic bone lesion. IMPRESSION: 1. Interval development of extensive airway impaction within the right middle and lower lobes with discoid atelectasis within the right lower lobe. Given findings within the stomach, this may relate to aspiration. 2. Extensive multi-vessel coronary artery calcification. 3. Balloon retention gastrostomy catheter with its tip within the post pyloric duodenal bulb. The stomach is moderately fluid distended suggesting at least some degree of gastric outlet obstruction. Repositioning is recommended. 4. Severe sigmoid diverticulosis without superimposed acute inflammatory change. Aortic Atherosclerosis (ICD10-I70.0). Electronically Signed   By: Helyn Numbers M.D.   On: 10/01/2023 00:18   DG Chest 2 View  Result Date: 09/30/2023 CLINICAL DATA:  Dyspnea EXAM: CHEST - 2 VIEW COMPARISON:  06/26/2023 FINDINGS: Discoid atelectasis right base. Lungs are otherwise clear. No pneumothorax or pleural effusion. Cardiac size within limits. The stent graft noted within the aortic arch unchanged. Healed bilateral rib fractures noted. No acute bone. IMPRESSION: 1. Right basilar discoid atelectasis. Electronically Signed   By: Helyn Numbers M.D.   On: 09/30/2023 21:48        Scheduled Meds:  allopurinol  100 mg Per Tube Daily   cyanocobalamin  250 mcg Per Tube Daily   famotidine  40 mg Per  Tube Daily   feeding supplement (PROSource TF20)  60 mL Per Tube Daily   finasteride  5 mg Oral Daily   free water  165 mL Per Tube Q4H   mirtazapine  15 mg Per Tube QHS   mometasone-formoterol  2 puff Inhalation BID   multivitamin  15 mL Per Tube Daily   pantoprazole (PROTONIX) IV  40 mg Intravenous Q6H   Followed by   Melene Muller ON 10/04/2023] pantoprazole (PROTONIX) IV  40 mg Intravenous Q12H   potassium chloride  40 mEq Per Tube Q6H   pravastatin  40 mg Per Tube Daily   primidone  50 mg Per Tube QHS   sodium chloride flush  3 mL Intravenous Q12H   thiamine  200 mg Per Tube Daily   Continuous Infusions:  ampicillin-sulbactam (UNASYN) IV 3 g (10/02/23 0928)   feeding supplement (OSMOLITE 1.5 CAL) 55 mL/hr at 10/02/23 0406     LOS: 1 day       Huey Bienenstock, MD [facility]   To contact the attending provider between 7A-7P or the covering provider during after hours 7P-7A, please log into the web site [email or web address] and access using universal [place] password for that web site. If you do not have the password, please call the hospital operator.  10/02/2023, 10:29 AM

## 2023-10-02 NOTE — Plan of Care (Signed)
  Problem: Education: Goal: Knowledge of discharge needs will improve Outcome: Progressing   Problem: Clinical Measurements: Goal: Postoperative complications will be avoided or minimized Outcome: Progressing   Problem: Respiratory: Goal: Ability to achieve and maintain a regular respiratory rate will improve Outcome: Progressing   Problem: Skin Integrity: Goal: Demonstration of wound healing without infection will improve Outcome: Progressing   Problem: Education: Goal: Knowledge of General Education information will improve Description: Including pain rating scale, medication(s)/side effects and non-pharmacologic comfort measures Outcome: Progressing   Problem: Health Behavior/Discharge Planning: Goal: Ability to manage health-related needs will improve Outcome: Progressing   Problem: Clinical Measurements: Goal: Ability to maintain clinical measurements within normal limits will improve Outcome: Progressing Goal: Will remain free from infection Outcome: Progressing Goal: Diagnostic test results will improve Outcome: Progressing Goal: Respiratory complications will improve Outcome: Progressing Goal: Cardiovascular complication will be avoided Outcome: Progressing   Problem: Activity: Goal: Risk for activity intolerance will decrease Outcome: Progressing   Problem: Nutrition: Goal: Adequate nutrition will be maintained Outcome: Progressing   Problem: Coping: Goal: Level of anxiety will decrease Outcome: Progressing   Problem: Elimination: Goal: Will not experience complications related to bowel motility Outcome: Progressing Goal: Will not experience complications related to urinary retention Outcome: Progressing   Problem: Pain Management: Goal: General experience of comfort will improve Outcome: Progressing   Problem: Safety: Goal: Ability to remain free from injury will improve Outcome: Progressing   Problem: Skin Integrity: Goal: Risk for impaired  skin integrity will decrease Outcome: Progressing   Problem: Activity: Goal: Ability to tolerate increased activity will improve Outcome: Progressing   Problem: Clinical Measurements: Goal: Ability to maintain a body temperature in the normal range will improve Outcome: Progressing   Problem: Respiratory: Goal: Ability to maintain adequate ventilation will improve Outcome: Progressing Goal: Ability to maintain a clear airway will improve Outcome: Progressing

## 2023-10-02 NOTE — Progress Notes (Signed)
PT Cancellation Note  Patient Details Name: Brian Dunn MRN: [id number] DOB: 07/29/1941   Cancelled Treatment:    Reason Eval/Treat Not Completed: Patient declined, no reason specified (Pt states that he has too much going and would like PT to come back later. Will follow up tomorrow.)   Gladys Damme 10/02/2023, 10:35 AM

## 2023-10-02 NOTE — Progress Notes (Signed)
Pt had some leakage around the PEG tube.Split gauze dressing changed.Attending MD and IR(Turpin Rinaldo Cloud) made aware.

## 2023-10-03 DIAGNOSIS — T17908A Unspecified foreign body in respiratory tract, part unspecified causing other injury, initial encounter: Secondary | ICD-10-CM | POA: Diagnosis not present

## 2023-10-03 LAB — BASIC METABOLIC PANEL
Anion gap: 7 (ref 5–15)
BUN: 29 mg/dL — ABNORMAL HIGH (ref 8–23)
CO2: 27 mmol/L (ref 22–32)
Calcium: 8.6 mg/dL — ABNORMAL LOW (ref 8.9–10.3)
Chloride: 108 mmol/L (ref 98–111)
Creatinine, Ser: 0.68 mg/dL (ref 0.61–1.24)
GFR, Estimated: >60 mL/min (ref >60)
Glucose, Bld: 136 mg/dL — ABNORMAL HIGH (ref 70–99)
Potassium: 3.7 mmol/L (ref 3.5–5.1)
Sodium: 142 mmol/L (ref 135–145)

## 2023-10-03 LAB — MAGNESIUM: Magnesium: 1.9 mg/dL (ref 1.7–2.4)

## 2023-10-03 LAB — GLUCOSE, CAPILLARY
Glucose-Capillary: 118 mg/dL — ABNORMAL HIGH (ref 70–99)
Glucose-Capillary: 119 mg/dL — ABNORMAL HIGH (ref 70–99)

## 2023-10-03 LAB — PHOSPHORUS: Phosphorus: 3.1 mg/dL (ref 2.5–4.6)

## 2023-10-03 MED ORDER — GERHARDT'S BUTT CREAM
TOPICAL_CREAM | Freq: Two times a day (BID) | CUTANEOUS | Status: DC
  Filled 2023-10-03: qty 1

## 2023-10-03 MED ORDER — AMLODIPINE BESYLATE 5 MG PO TABS
5.0000 mg | ORAL_TABLET | Freq: Every day | ORAL | Status: DC
  Administered 2023-10-03 – 2023-10-04 (×2): 5 mg
  Filled 2023-10-03 (×2): qty 1

## 2023-10-03 NOTE — Plan of Care (Signed)
  Problem: Education: Goal: Knowledge of discharge needs will improve Outcome: Progressing   Problem: Clinical Measurements: Goal: Postoperative complications will be avoided or minimized Outcome: Progressing   Problem: Respiratory: Goal: Ability to achieve and maintain a regular respiratory rate will improve Outcome: Progressing   Problem: Skin Integrity: Goal: Demonstration of wound healing without infection will improve Outcome: Progressing   Problem: Education: Goal: Knowledge of General Education information will improve Description: Including pain rating scale, medication(s)/side effects and non-pharmacologic comfort measures Outcome: Progressing   Problem: Health Behavior/Discharge Planning: Goal: Ability to manage health-related needs will improve Outcome: Progressing   Problem: Clinical Measurements: Goal: Ability to maintain clinical measurements within normal limits will improve Outcome: Progressing Goal: Will remain free from infection Outcome: Progressing Goal: Diagnostic test results will improve Outcome: Progressing Goal: Respiratory complications will improve Outcome: Progressing Goal: Cardiovascular complication will be avoided Outcome: Progressing   Problem: Activity: Goal: Risk for activity intolerance will decrease Outcome: Progressing   Problem: Nutrition: Goal: Adequate nutrition will be maintained Outcome: Progressing   Problem: Coping: Goal: Level of anxiety will decrease Outcome: Progressing   Problem: Elimination: Goal: Will not experience complications related to bowel motility Outcome: Progressing Goal: Will not experience complications related to urinary retention Outcome: Progressing   Problem: Pain Management: Goal: General experience of comfort will improve Outcome: Progressing   Problem: Safety: Goal: Ability to remain free from injury will improve Outcome: Progressing   Problem: Skin Integrity: Goal: Risk for impaired  skin integrity will decrease Outcome: Progressing   Problem: Activity: Goal: Ability to tolerate increased activity will improve Outcome: Progressing   Problem: Clinical Measurements: Goal: Ability to maintain a body temperature in the normal range will improve Outcome: Progressing   Problem: Respiratory: Goal: Ability to maintain adequate ventilation will improve Outcome: Progressing Goal: Ability to maintain a clear airway will improve Outcome: Progressing

## 2023-10-03 NOTE — Progress Notes (Signed)
PROGRESS NOTE    Brian Dunn  HYQ:[id number] DOB: [birth year] DOA: 09/30/2023 PCP: Annamaria Helling, DO    Chief Complaint  Patient presents with   Gastric Tube Problem    Brief Narrative:   Brian Dunn is a 82 y.o. male with medical history significant for hypertension, hyperlipidemia, CAD, and dysphagia with PEG tube who presents with abdominal pain, nausea, vomiting, cough, and shortness of breath.   Patient has been experiencing abdominal pain, nausea, and vomiting.  He had a CT at an outside hospital, was told that the PEG tube tip was in the duodenum, and was advised to seek care at [facility] where the tube was placed.  Patient states that he feels like he aspirated, has had progressive dyspnea, and productive cough.     ED Course: Upon arrival to the ED, patient is found to be afebrile with saturation in the upper 90s on high flow nasal cannula, normal heart rate, and stable blood pressure.  CMP is essentially normal, CBC notable for mild normocytic anemia, lactic acid reassuringly normal, and troponin normal x 2.  CT demonstrates extensive airway impaction on the right which could be from aspiration, and gastrostomy catheter tip in postpyloric duodenal bulb with fluid distended stomach.    PEG tube was retracted 5 cm by the ED physician, patient was started on IV Protonix, and treated with fentanyl, Zofran, vancomycin, aztreonam, and Flagyl.   Assessment & Plan:   Principal Problem:   Aspiration into airway, initial encounter Active Problems:   Dysphagia   Hypertension   Cough variant asthma   GI bleeding   Acute respiratory failure with hypoxia (HCC)  Acute respiratory failure with hypoxia Aspiration - Pt with dysphagia and hx of aspiration p/w productive cough and SOB after vomiting and is found to be hypoxic with coarse rhonchi, respiratory status much improved, he is currently on room air. -Continue with IV antibiotic -N.p.o. for now, all  feeding and meds via PEG -Continue with IV Unasyn, respiratory status has improved, as well his white blood cell count trending down -Was encouraged to use incentive spirometer, flutter valve, as well requested RT to evaluate for chest physiotherapy given significant aspiration on imaging   PEG tube malposition -IR input greatly appreciated, tube was exchanged 11/27  Heme occult positive stool -Is most likely due to PEG malposition, BC stable, no evidence of significant GI bleed -Continue with PPI   Hypertension  -Appears to be on any home medications, but blood pressure is more persistently elevated currently so I will start on low-dose Norvasc, continue with as needed hydralazine as well.   Asthma  - Continue ICS-LABA and as-needed albuterol     High grade stenosis of basilar artery. -Resume aspirin when stable -Continue with statin  -Avoid orthostasis   History of Kommerell's diverticulum:  - S/p EVAR by vasc surgery last April with R subclavian,carotid transposition.     Dysphagia:  -Due to Kommerell's diverticulum.  Previous record only sips of water, but all feeding and meds via PEG  Hyperlipidemia -Continue with statin  Severe PCM -BMI of 16.7, nutritionist consulted  Hypokalemia -Repleted       DVT prophylaxis: SCD Code Status: DNR Family Communication: None at bedside Disposition:   Status is: Inpatient    Consultants:  IR   Subjective:  No significant events overnight as discussed with staff, patient denies any complaints today.  Reports some leaking at PICC insertion site, but does not appear to be significant issue today.  Objective: Vitals:   10/02/23 2005 10/03/23 0000 10/03/23 0400 10/03/23 0800  BP:  (!) 151/68 138/67 (!) 167/70  Pulse: 81 86 70 77  Resp: 15 12 (!) 22 (!) 23  Temp:  98 F (36.7 C) 99.7 F (37.6 C) (!) 97.5 F (36.4 C)  TempSrc:  Axillary Axillary Oral  SpO2: 97% 93% 95% 97%  Weight:   60 kg   Height:         Intake/Output Summary (Last 24 hours) at 10/03/2023 1243 Last data filed at 10/03/2023 0423 Gross per 24 hour  Intake 1910.17 ml  Output 1100 ml  Net 810.17 ml    Filed Weights   10/01/23 0000 10/01/23 1231 10/03/23 0400  Weight: 59.7 kg 52.8 kg 60 kg    Examination:  Awake Alert, Oriented X 3, trembly frail, cachectic, Symmetrical Chest wall movement, Good air movement bilaterally, CTAB RRR,No Gallops,Rubs or new Murmurs, No Parasternal Heave +ve B.Sounds, Abd Soft, No tenderness, PEG+ No Cyanosis, Clubbing or edema, No new Rash or bruise       Data Reviewed: I have personally reviewed following labs and imaging studies  CBC: Recent Labs  Lab 10/01/23 0426 10/01/23 1229 10/01/23 1934 10/02/23 0351 10/02/23 1100  WBC 10.8* 14.5* 12.0* 9.3 10.2  HGB 12.4* 12.2* 10.2* 10.0* 10.2*  HCT 36.6* 36.2* 30.9* 30.6* 32.0*  MCV 96.1 98.4 96.9 99.7 101.3*  PLT 186 194 169 151 155    Basic Metabolic Panel: Recent Labs  Lab 09/30/23 1604 10/01/23 0426 10/01/23 1818 10/02/23 0351 10/02/23 1722 10/03/23 0418  NA 136 141  --  140  --  142  K 4.1 4.1  --  3.0*  --  3.7  CL 101 102  --  104  --  108  CO2 23 24  --  28  --  27  GLUCOSE 109* 127*  --  149*  --  136*  BUN 23 29*  --  36*  --  29*  CREATININE 0.80 0.85  --  0.76  --  0.68  CALCIUM 9.6 9.5  --  8.8*  --  8.6*  MG  --   --  1.9 2.1 2.1 1.9  PHOS  --   --  2.8 2.8 2.6 3.1    GFR: Estimated Creatinine Clearance: 59.4 mL/min (by C-G formula based on SCr of 0.68 mg/dL).  Liver Function Tests: Recent Labs  Lab 09/30/23 1604  AST 27  ALT 20  ALKPHOS 45  BILITOT 0.8  PROT 7.2  ALBUMIN 3.8    CBG: Recent Labs  Lab 10/02/23 0803 10/02/23 1214 10/02/23 1610 10/02/23 2004 10/03/23 0030  GLUCAP 146* 94 93 120* 119*     No results found for this or any previous visit (from the past 240 hour(s)).       Radiology Studies: No results found.      Scheduled Meds:  allopurinol  100 mg  Per Tube Daily   cyanocobalamin  250 mcg Per Tube Daily   famotidine  40 mg Per Tube Daily   feeding supplement (PROSource TF20)  60 mL Per Tube Daily   finasteride  5 mg Oral Daily   free water  165 mL Per Tube Q4H   mirtazapine  15 mg Per Tube QHS   mometasone-formoterol  2 puff Inhalation BID   multivitamin  15 mL Per Tube Daily   pantoprazole (PROTONIX) IV  40 mg Intravenous Q6H   Followed by   Melene Muller ON 10/04/2023] pantoprazole (PROTONIX) IV  40 mg Intravenous Q12H   pravastatin  40 mg Per Tube Daily   primidone  50 mg Per Tube QHS   sodium chloride flush  3 mL Intravenous Q12H   thiamine  200 mg Per Tube Daily   Continuous Infusions:  ampicillin-sulbactam (UNASYN) IV 3 g (10/03/23 0913)   feeding supplement (OSMOLITE 1.5 CAL) 55 mL/hr at 10/02/23 1528     LOS: 2 days       Huey Bienenstock, MD [facility]   To contact the attending provider between 7A-7P or the covering provider during after hours 7P-7A, please log into the web site [email or web address] and access using universal [place] password for that web site. If you do not have the password, please call the hospital operator.  10/03/2023, 12:43 PM

## 2023-10-03 NOTE — Evaluation (Signed)
Physical Therapy Evaluation Patient Details Name: Brian Dunn MRN: [id number] DOB: 09-10-1941 Today's Date: 10/03/2023  History of Present Illness  Brian Dunn is a 82 y.o. male who presented with abdominal pain, nausea, vomiting, cough, and shortness of breath. CT at an outside hospital, was told that the PEG tube tip was in the duodenum, and was advised to seek care at [facility] where the tube was placed. PMH: CAD, COPD, CKD, Dysphasgia, Essential HTN, Osteopenia  Clinical Impression  Pt presents with admitting diagnosis above. Pt today was able to ambulate around unit with rollator at supervision level. PTA pt was Mod I with rollator however does endorse 2 recent falls. Recommend HHPT upon DC. PT will continue to follow. Patient needs to practice stairs next session.         If plan is discharge home, recommend the following: A little help with walking and/or transfers;A little help with bathing/dressing/bathroom;Assistance with cooking/housework;Assist for transportation;Help with stairs or ramp for entrance   Can travel by private vehicle        Equipment Recommendations None recommended by PT  Recommendations for Other Services       Functional Status Assessment Patient has had a recent decline in their functional status and demonstrates the ability to make significant improvements in function in a reasonable and predictable amount of time.     Precautions / Restrictions Precautions Precautions: Fall Precaution Comments: peg Restrictions Weight Bearing Restrictions: No      Mobility  Bed Mobility Overal bed mobility: Needs Assistance Bed Mobility: Supine to Sit, Sit to Supine     Supine to sit: Supervision Sit to supine: Supervision        Transfers Overall transfer level: Needs assistance Equipment used: Rollator (4 wheels) Transfers: Sit to/from Stand Sit to Stand: Supervision           General transfer comment: tolerated  ambulation around the entire unit without rest break, VSS. Intially tried to ambulate with brakes locked so cued to unlock brakes.    Ambulation/Gait Ambulation/Gait assistance: Supervision Gait Distance (Feet): 150 Feet Assistive device: Rollator (4 wheels) Gait Pattern/deviations: Trunk flexed, Decreased stride length, Step-through pattern Gait velocity: decreased     General Gait Details: no LOB noted. Appears to be close to baseline for mobility. Pt states that he normally walks faster however IV lines and PEG tube are likely limiting speed.  Stairs            Wheelchair Mobility     Tilt Bed    Modified Rankin (Stroke Patients Only)       Balance Overall balance assessment: No apparent balance deficits (not formally assessed)                                           Pertinent Vitals/Pain Pain Assessment Pain Assessment: Faces Faces Pain Scale: Hurts a little bit Pain Location: peg site Pain Descriptors / Indicators: Discomfort Pain Intervention(s): Monitored during session    Home Living Family/patient expects to be discharged to:: Private residence Living Arrangements: Spouse/significant other Available Help at Discharge: Family;Available 24 hours/day Type of Home: House Home Access: Stairs to enter   [employer] of Steps: 3   Home Layout: One level Home Equipment: Grab bars - tub/shower;Shower Counsellor (2 wheels);Rollator (4 wheels)      Prior Function Prior Level of Function : Independent/Modified Independent;Driving  Mobility Comments: rollator for night time use and intermittent day time use, 2 recent falls ADLs Comments: mod I, spouse assists as needed     Extremity/Trunk Assessment   Upper Extremity Assessment Upper Extremity Assessment: Generalized weakness    Lower Extremity Assessment Lower Extremity Assessment: Generalized weakness    Cervical / Trunk Assessment Cervical /  Trunk Assessment: Kyphotic;Other exceptions Cervical / Trunk Exceptions: brusing from recent fall  Communication   Communication Communication: No apparent difficulties  Cognition Arousal: Alert Behavior During Therapy: WFL for tasks assessed/performed Overall Cognitive Status: Within Functional Limits for tasks assessed                                          General Comments General comments (skin integrity, edema, etc.): VSS on RA    Exercises     Assessment/Plan    PT Assessment Patient needs continued PT services  PT Problem List Decreased strength;Decreased range of motion;Decreased balance;Decreased activity tolerance;Decreased mobility;Decreased coordination;Decreased knowledge of use of DME;Decreased safety awareness;Decreased knowledge of precautions       PT Treatment Interventions DME instruction;Gait training;Stair training;Functional mobility training;Therapeutic activities;Therapeutic exercise;Balance training;Neuromuscular re-education;Patient/family education    PT Goals (Current goals can be found in the Care Plan section)  Acute Rehab PT Goals Patient Stated Goal: to go home PT Goal Formulation: With patient Time For Goal Achievement: 10/17/23 Potential to Achieve Goals: Good    Frequency Min 1X/week     Co-evaluation               AM-PAC PT "6 Clicks" Mobility  Outcome Measure Help needed turning from your back to your side while in a flat bed without using bedrails?: A Little Help needed moving from lying on your back to sitting on the side of a flat bed without using bedrails?: A Little Help needed moving to and from a bed to a chair (including a wheelchair)?: A Little Help needed standing up from a chair using your arms (e.g., wheelchair or bedside chair)?: A Little Help needed to walk in hospital room?: A Little Help needed climbing 3-5 steps with a railing? : A Little 6 Click Score: 18    End of Session Equipment  Utilized During Treatment: Gait belt Activity Tolerance: Patient tolerated treatment well Patient left: in bed;with call bell/phone within reach Nurse Communication: Mobility status PT Visit Diagnosis: Other abnormalities of gait and mobility (R26.89)    Time: 2956-2130 PT Time Calculation (min) (ACUTE ONLY): 14 min   Charges:   PT Evaluation $PT Eval Moderate Complexity: 1 Mod   PT General Charges $$ ACUTE PT VISIT: 1 Visit         Shela Nevin, PT, DPT Acute Rehab Services [phone]   Gladys Damme 10/03/2023, 3:45 PM

## 2023-10-03 NOTE — Progress Notes (Signed)
Occupational Therapy Treatment Patient Details Name: Brian Dunn MRN: [id number] DOB: Dec 05, 1940 Today's Date: 10/03/2023   History of present illness Brian Dunn is a 82 y.o. male who presented with abdominal pain, nausea, vomiting, cough, and shortness of breath. CT at an outside hospital, was told that the PEG tube tip was in the duodenum, and was advised to seek care at [facility] where the tube was placed. PMH: CAD, COPD, CKD, Dysphasgia, Essential HTN, Osteopenia   OT comments  Pt is making good progress towards their acute OT goals. Upon arrival pt was on RA, pt left on RA throughout session with VSS, SpO2 >90%. He did not need physical assist for transfers or long distance mobility with rollator. Discussed energy conservation techniques, pt receptive and demonstrating improved tolerance for activity. OT to continue to follow acutely to facilitate progress towards established goals. Pt will continue to benefit from no follow up OT.       If plan is discharge home, recommend the following:  A little help with walking and/or transfers;A little help with bathing/dressing/bathroom;Assistance with cooking/housework;Assist for transportation   Equipment Recommendations  None recommended by OT       Precautions / Restrictions Precautions Precautions: Fall Precaution Comments: peg Restrictions Weight Bearing Restrictions: No       Mobility Bed Mobility Overal bed mobility: Needs Assistance Bed Mobility: Supine to Sit, Sit to Supine     Supine to sit: Contact guard Sit to supine: Supervision        Transfers Overall transfer level: Needs assistance Equipment used: Rollator (4 wheels) Transfers: Sit to/from Stand Sit to Stand: Supervision           General transfer comment: tolerated ambulation around the entire unit without rest break, VSS     Balance Overall balance assessment: No apparent balance deficits (not formally assessed)                ADL either performed or assessed with clinical judgement   ADL Overall ADL's : Needs assistance/impaired                         Toilet Transfer: Supervision/safety;Rollator (4 wheels) Toilet Transfer Details (indicate cue type and reason): simulated         Functional mobility during ADLs: Supervision/safety;Rollator (4 wheels) General ADL Comments: demonstrated much improved activity tolerance this date; pt declined need to participate in ADLs. eager to walk and work on activity tolerance    Extremity/Trunk Assessment Upper Extremity Assessment Upper Extremity Assessment: Generalized weakness   Lower Extremity Assessment Lower Extremity Assessment: Defer to PT evaluation        Vision   Vision Assessment?: No apparent visual deficits   Perception Perception Perception: Within Functional Limits   Praxis Praxis Praxis: WFL    Cognition Arousal: Alert Behavior During Therapy: WFL for tasks assessed/performed Overall Cognitive Status: Within Functional Limits for tasks assessed                  General Comments VSS on RA    Pertinent Vitals/ Pain       Pain Assessment Pain Assessment: Faces Faces Pain Scale: Hurts a little bit Pain Location: peg site Pain Descriptors / Indicators: Discomfort Pain Intervention(s): Limited activity within patient's tolerance, Monitored during session   Frequency  Min 1X/week        Progress Toward Goals  OT Goals(current goals can now be found in the care plan section)  Progress  towards OT goals: Progressing toward goals  Acute Rehab OT Goals Patient Stated Goal: to feel better OT Goal Formulation: With patient Time For Goal Achievement: 10/15/23 Potential to Achieve Goals: Good ADL Goals Additional ADL Goal #1: Pt will complete BADLs with mod I Additional ADL Goal #2: pt will indep recall at least 3 energy conservation strategies to apply at discharge   AM-PAC OT "6 Clicks" Daily Activity      Outcome Measure   Help from another person eating meals?: Total Help from another person taking care of personal grooming?: A Little Help from another person toileting, which includes using toliet, bedpan, or urinal?: A Little Help from another person bathing (including washing, rinsing, drying)?: A Little Help from another person to put on and taking off regular upper body clothing?: A Little Help from another person to put on and taking off regular lower body clothing?: A Little 6 Click Score: 16    End of Session Equipment Utilized During Treatment: Rollator (4 wheels)  OT Visit Diagnosis: Other abnormalities of gait and mobility (R26.89);Muscle weakness (generalized) (M62.81)   Activity Tolerance Patient tolerated treatment well   Patient Left in bed;with call bell/phone within reach;with bed alarm set   Nurse Communication Mobility status        Time: 6213-0865 OT Time Calculation (min): 20 min  Charges: OT General Charges $OT Visit: 1 Visit OT Treatments $Therapeutic Activity: 8-22 mins  Derenda Mis, OTR/L Acute Rehabilitation Services Office [phone] Secure Chat Communication Preferred   Donia Pounds 10/03/2023, 11:53 AM

## 2023-10-03 NOTE — Progress Notes (Signed)
TRH night cross cover note:   I was notified by RN of the appearance of irritation around the patient's PEG tube, with associated request for Gerhardt's Butt cream, which I subsequently ordered to be applied on bid basis.     Newton Pigg, DO Hospitalist

## 2023-10-04 ENCOUNTER — Other Ambulatory Visit (HOSPITAL_COMMUNITY): Payer: Self-pay

## 2023-10-04 DIAGNOSIS — T17908A Unspecified foreign body in respiratory tract, part unspecified causing other injury, initial encounter: Secondary | ICD-10-CM | POA: Diagnosis not present

## 2023-10-04 LAB — BASIC METABOLIC PANEL
Anion gap: 4 — ABNORMAL LOW (ref 5–15)
BUN: 30 mg/dL — ABNORMAL HIGH (ref 8–23)
CO2: 27 mmol/L (ref 22–32)
Calcium: 8.2 mg/dL — ABNORMAL LOW (ref 8.9–10.3)
Chloride: 108 mmol/L (ref 98–111)
Creatinine, Ser: 0.7 mg/dL (ref 0.61–1.24)
GFR, Estimated: >60 mL/min (ref >60)
Glucose, Bld: 149 mg/dL — ABNORMAL HIGH (ref 70–99)
Potassium: 3.4 mmol/L — ABNORMAL LOW (ref 3.5–5.1)
Sodium: 139 mmol/L (ref 135–145)

## 2023-10-04 LAB — CBC
HCT: 28.6 % — ABNORMAL LOW (ref 39.0–52.0)
Hemoglobin: 9.1 g/dL — ABNORMAL LOW (ref 13.0–17.0)
MCH: 31.5 pg (ref 26.0–34.0)
MCHC: 31.8 g/dL (ref 30.0–36.0)
MCV: 99 fL (ref 80.0–100.0)
Platelets: 146 10*3/uL — ABNORMAL LOW (ref 150–400)
RBC: 2.89 MIL/uL — ABNORMAL LOW (ref 4.22–5.81)
RDW: 14.1 % (ref 11.5–15.5)
WBC: 7.8 10*3/uL (ref 4.0–10.5)
nRBC: 0 % (ref 0.0–0.2)

## 2023-10-04 LAB — GLUCOSE, CAPILLARY
Glucose-Capillary: 108 mg/dL — ABNORMAL HIGH (ref 70–99)
Glucose-Capillary: 109 mg/dL — ABNORMAL HIGH (ref 70–99)
Glucose-Capillary: 85 mg/dL (ref 70–99)

## 2023-10-04 MED ORDER — POTASSIUM CHLORIDE 20 MEQ PO PACK
40.0000 meq | PACK | Freq: Once | ORAL | Status: AC
  Administered 2023-10-04: 40 meq
  Filled 2023-10-04: qty 2

## 2023-10-04 MED ORDER — AMOXICILLIN-POT CLAVULANATE 600-42.9 MG/5ML PO SUSR
600.0000 mg | Freq: Two times a day (BID) | ORAL | 0 refills | Status: DC
  Filled 2023-10-04: qty 75, 8d supply, fill #0

## 2023-10-04 MED ORDER — COLCHICINE 0.3 MG HALF TABLET
0.3000 mg | ORAL_TABLET | Freq: Every day | ORAL | Status: DC
  Administered 2023-10-04: 0.3 mg
  Filled 2023-10-04: qty 1

## 2023-10-04 MED ORDER — AMLODIPINE BESYLATE 5 MG PO TABS
5.0000 mg | ORAL_TABLET | Freq: Every day | ORAL | 0 refills | Status: DC
  Filled 2023-10-04: qty 30, 30d supply, fill #0

## 2023-10-04 MED ORDER — COLCHICINE 0.6 MG PO TABS
0.3000 mg | ORAL_TABLET | Freq: Every day | ORAL | 0 refills | Status: DC
  Filled 2023-10-04: qty 3, 6d supply, fill #0

## 2023-10-04 MED ORDER — AMOXICILLIN-POT CLAVULANATE 600-42.9 MG/5ML PO SUSR
7.0000 mL | Freq: Two times a day (BID) | ORAL | 0 refills | Status: AC
  Filled 2023-10-04: qty 75, 5d supply, fill #0

## 2023-10-04 NOTE — Discharge Summary (Addendum)
Physician Discharge Summary  Jorey Jahoda Lofland ZOX:[id number] DOB: 09/27/1941 DOA: 09/30/2023  PCP: Annamaria Helling, DO  Admit date: 09/30/2023 Discharge date: 10/04/2023  Admitted From: (Home) Disposition:  (Home)  Recommendations for Outpatient Follow-up:  Follow up with PCP in 1-2 weeks Please obtain BMP/CBC in one week 's check chest x-ray and 2 to 3 weeks   Diet recommendation: aall feeding and meds via PEG, only allowed ice chips and sips of water by mouth  Brief/Interim Summary: Brian Dunn is a 82 y.o. male with medical history significant for hypertension, hyperlipidemia, CAD, and dysphagia with PEG tube who presents with abdominal pain, nausea, vomiting, cough, and shortness of breath.   Patient has been experiencing abdominal pain, nausea, and vomiting.  He had a CT at an outside hospital, was told that the PEG tube tip was in the duodenum, and was advised to seek care at [facility] where the tube was placed.  Patient states that he feels like he aspirated, has had progressive dyspnea, and productive cough.     ED Course: Upon arrival to the ED, patient is found to be afebrile with saturation in the upper 90s on high flow nasal cannula, normal heart rate, and stable blood pressure.  CMP is essentially normal, CBC notable for mild normocytic anemia, lactic acid reassuringly normal, and troponin normal x 2.  CT demonstrates extensive airway impaction on the right which could be from aspiration, and gastrostomy catheter tip in postpyloric duodenal bulb with fluid distended stomach.    PEG tube was retracted 5 cm by the ED physician, patient was started on IV Protonix, and treated with fentanyl, Zofran, vancomycin, aztreonam, and Flagyl  IR has been consulted, and PEG has been replaced, he was kept on IV Unasyn due to concern for aspiration pneumonia, respiratory failure has resolved, he is currently on room air, tolerating feeding via PEG, will be discharged home  today.   Acute respiratory failure with hypoxia Aspiration pneumonia - Pt with dysphagia and hx of aspiration p/w productive cough and SOB after vomiting and is found to be hypoxic with coarse rhonchi, respiratory status much improved, he is currently on room air. -N.p.o. for now, all feeding and meds via PEG -Currently on broad-spectrum antibiotic, this was narrowed to IV Unasyn, respiratory status has improved, as well his white blood cell count trending resolved, no further dyspnea, hypoxia, he will be discharged on oral Augmentin via PEG. -Was encouraged to use incentive spirometer, flutter valve,   PEG tube malposition -IR input greatly appreciated, tube was exchanged 11/27, and has been functioning appropriately   Heme occult positive stool -Is most likely due to PEG malposition, BC stable, no evidence of any significant GI bleed, he was kept on PPI during hospital stay, he is not instructed to continue with his Pepcid on discharge   Hypertension  -Appears to be on any home medications, but blood pressure is more persistently significantly elevated currently so I I did start on low-dose Norvasc, to be continued on discharge   Asthma  - Continue ICS-LABA and as-needed albuterol     High grade stenosis of basilar artery. -Resume aspirin -Continue with statin  -Will need to avoid orthostasis, but his blood pressure has been significantly elevated up in the 160s persistently, so he was started on low-dose amlodipine.    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     Dysphagia:  -Due to Kommerell's diverticulum.  Previous  record only sips of water, but all feeding and meds via PEG   Hyperlipidemia -Continue with statin   Severe PCM -BMI of 16.7, nutritionist consulted   Hypokalemia -Repleted  Gout flare -Complaining of left wrist pain, slightly warm, most likely due to gout flare, has been holding  Celebrex during hospital stay, it is resumed on discharge, I will give colchicine before discharge today and to continue for another 3 days, to continue with home allopurinol.       Discharge Diagnoses:  Principal Problem:   Aspiration into airway, initial encounter Active Problems:   Dysphagia   Hypertension   Cough variant asthma   GI bleeding   Acute respiratory failure with hypoxia [facility])    Discharge Instructions  Discharge Instructions     Diet - low sodium heart healthy   Complete by: As directed    Discharge instructions   Complete by: As directed    Follow with Primary MD Annamaria Helling, DO in 7 days   Get CBC, CMP, 2 view Chest X ray checked  by Primary MD next visit.    Activity: As tolerated with Full fall precautions use walker/cane & assistance as needed   Disposition Home    Diet: All food and meds via PEG   On your next visit with your primary care physician please Get Medicines reviewed and adjusted.   Please request your Prim.MD to go over all Hospital Tests and Procedure/Radiological results at the follow up, please get all Hospital records sent to your Prim MD by signing hospital release before you go home.   If you experience worsening of your admission symptoms, develop shortness of breath, life threatening emergency, suicidal or homicidal thoughts you must seek medical attention immediately by calling 911 or calling your MD immediately  if symptoms less severe.  You Must read complete instructions/literature along with all the possible adverse reactions/side effects for all the Medicines you take and that have been prescribed to you. Take any new Medicines after you have completely understood and accpet all the possible adverse reactions/side effects.   Do not drive, operating heavy machinery, perform activities at heights, swimming or participation in water activities or provide baby sitting services if your were admitted for syncope or  siezures until you have seen by Primary MD or a Neurologist and advised to do so again.  Do not drive when taking Pain medications.    Do not take more than prescribed Pain, Sleep and Anxiety Medications  Special Instructions: If you have smoked or chewed Tobacco  in the last 2 yrs please stop smoking, stop any regular Alcohol  and or any Recreational drug use.  Wear Seat belts while driving.   Please note  You were cared for by a hospitalist during your hospital stay. If you have any questions about your discharge medications or the care you received while you were in the hospital after you are discharged, you can call the unit and asked to speak with the hospitalist on call if the hospitalist that took care of you is not available. Once you are discharged, your primary care physician will handle any further medical issues. Please note that NO REFILLS for any discharge medications will be authorized once you are discharged, as it is imperative that you return to your primary care physician (or establish a relationship with a primary care physician if you do not have one) for your aftercare needs so that they can reassess your need for medications and monitor your  lab values.   Increase activity slowly   Complete by: As directed       Allergies as of 10/04/2023       Reactions   Metoprolol Other (See Comments)   Weight gain   Morphine Rash        Medication List     TAKE these medications    allopurinol 100 MG tablet Commonly known as: ZYLOPRIM Place 100 mg into feeding tube daily.   amLODipine 5 MG tablet Commonly known as: NORVASC Place 1 tablet (5 mg total) into feeding tube daily as directed.   amoxicillin-clavulanate 600-42.9 MG/5ML suspension Commonly known as: AUGMENTIN Place 5 mLs (600 mg total) into feeding tube 2 (two) times daily for 2 days. Discard remainder   aspirin 81 MG chewable tablet Place 81 mg into feeding tube daily.   budesonide-formoterol  160-4.5 MCG/ACT inhaler Commonly known as: SYMBICORT Inhale 2 puffs into the lungs daily.   celecoxib 200 MG capsule Commonly known as: CELEBREX Place 200 mg into feeding tube daily.   colchicine 0.6 MG tablet Take 0.5 tablets (0.3 mg total) by mouth daily.   docusate 50 MG/5ML liquid Commonly known as: COLACE Place 10 mLs (100 mg total) into feeding tube daily.   famotidine 40 MG tablet Commonly known as: PEPCID Place 1 tablet (40 mg total) into feeding tube daily.   feeding supplement (OSMOLITE 1.5 CAL) Liqd Place 237 mLs into feeding tube 5 (five) times daily.   finasteride 5 MG tablet Commonly known as: PROSCAR 5 mg. Place 1 tablet into the feeding tube daily   fluticasone 50 MCG/ACT nasal spray Commonly known as: FLONASE Place 2 sprays into the nose daily.   Iron 325 (65 Fe) MG Tabs 65 mg by Feeding Tube route daily.   loratadine 10 MG tablet Commonly known as: CLARITIN Place 1 tablet (10 mg total) into feeding tube daily.   MAGNESIUM PO 30 mLs by Feeding Tube route daily.   mirtazapine 15 MG tablet Commonly known as: REMERON Place 15 mg into feeding tube at bedtime.   multivitamin capsule Place 1 capsule into feeding tube daily.   nitroGLYCERIN 0.4 MG SL tablet Commonly known as: NITROSTAT Place 1 tablet (0.4 mg total) under the tongue every 5 (five) minutes as needed for chest pain.   pravastatin 40 MG tablet Commonly known as: PRAVACHOL Take 1 tablet (40 mg total) by mouth daily. What changed: how to take this   primidone 50 MG tablet Commonly known as: MYSOLINE Place 1 tablet (50 mg total) into feeding tube at bedtime.   senna-docusate 8.6-50 MG tablet Commonly known as: Senokot-S Place 1 tablet into feeding tube at bedtime as needed for mild constipation.   traMADol 50 MG tablet Commonly known as: ULTRAM Place 1 tablet (50 mg total) into feeding tube every 6 (six) hours as needed for moderate pain.   VITAMIN B12 PO Give 5 mg by tube  daily.        Follow-up Information     [facility].   Specialty: Home Health Services Why: for resumption of [facility] services Contact information: [address] [place] [address] [phone]                Allergies  Allergen Reactions   Metoprolol Other (See Comments)    Weight gain   Morphine Rash    Consultations: IR   Procedures/Studies: IR Replc Gastro/Colonic Tube Percut W/Fluoro  Result Date: 10/01/2023 INDICATION: 82 year old male presents for  exchange of indwelling gastrostomy for his personalized low-profile 24 [place], 2.5 stomal length Mickey button gastrostomy EXAM: GASTROSTOMY CATHETER REPLACEMENT MEDICATIONS: None ANESTHESIA/SEDATION: None CONTRAST:  5mL OMNIPAQUE IOHEXOL 300 MG/ML SOLN - administered into the gastric lumen. FLUOROSCOPY: Radiation Exposure Index (as provided by the fluoroscopic device): 1.2 mGy Kerma COMPLICATIONS: None PROCEDURE: Informed written consent was obtained from the patient and the patient's family after a thorough discussion of the procedural risks, benefits and alternatives. All questions were addressed. Clean technique was utilized including caps, mask, sterile gowns, sterile gloves, sterile drape, hand hygiene and skin antiseptic. A timeout was performed prior to the initiation of the procedure. The indwelling percutaneous gastrostomy was removed after deflating the balloon. The new 24 French 2.5 stomal length Mickey button low-profile gastrostomy was placed through the stoma. Balloon was inflated. Contrast was injected confirming location. Final image was stored. Patient tolerated the procedure well and remained hemodynamically stable throughout. No complications were encountered and no significant blood loss encountered. IMPRESSION: Image guided exchange of gastrostomy, with placement of the patient's personalized low-profile Mickey button 24 French 2.5 cm stomal length gastrostomy. Electronically  Signed   By: Gilmer Mor D.O.   On: 10/01/2023 13:52   DG Abd Portable 1V  Result Date: 10/01/2023 CLINICAL DATA:  G-tube repositioning post CT EXAM: PORTABLE ABDOMEN - 1 VIEW COMPARISON:  CT abdomen and pelvis 09/30/2023 FINDINGS: Enteric contrast injected through the gastrostomy tube is present within the first and second portions of the duodenum. No contrast is visualized within the stomach. The balloon catheter is not outlined by contrast. No evidence of bowel obstruction. Contrast from prior studies within the bladder and rectum. Left common iliac artery stent. Screw in the posterior right acetabulum. IMPRESSION: Enteric contrast injected through the G-tube is within the first and second portions of the duodenum. No enteric contrast within the stomach. Electronically Signed   By: Minerva Fester M.D.   On: 10/01/2023 03:12   DG Chest Port 1 View  Result Date: 10/01/2023 CLINICAL DATA:  Aspiration EXAM: PORTABLE CHEST 1 VIEW COMPARISON:  CT 09/30/2023, 03/26/2023 FINDINGS: Coronary stent. Stent graft at the aortic arch. Right infrahilar bandlike opacity, corresponding to CT demonstrated right lower lobe atelectasis stable cardiomediastinal silhouette. No pneumothorax. Old rib fractures IMPRESSION: Right infrahilar bandlike opacity, corresponding to CT demonstrated right lower lobe atelectasis. Electronically Signed   By: Jasmine Pang M.D.   On: 10/01/2023 01:38   CT ABDOMEN PELVIS W CONTRAST  Result Date: 10/01/2023 CLINICAL DATA:  Acute nonlocalized abdominal pain EXAM: CT ABDOMEN AND PELVIS WITH CONTRAST TECHNIQUE: Multidetector CT imaging of the abdomen and pelvis was performed using the standard protocol following bolus administration of intravenous contrast. RADIATION DOSE REDUCTION: This exam was performed according to the departmental dose-optimization program which includes automated exposure control, adjustment of the mA and/or kV according to patient size and/or use of iterative  reconstruction technique. CONTRAST:  75mL OMNIPAQUE IOHEXOL 350 MG/ML SOLN COMPARISON:  09/29/2023 FINDINGS: Lower chest: There has developed extensive airway impaction within the right middle and lower lobes with discoid atelectasis within the right lower lobe. No focal consolidation. No pleural effusion. Extensive multi-vessel coronary artery calcification. Hepatobiliary: No focal liver abnormality is seen. No gallstones, gallbladder wall thickening, or biliary dilatation. Pancreas: Unremarkable Spleen: Unremarkable Adrenals/Urinary Tract: Adrenal glands are unremarkable. Kidneys are normal, without renal calculi, focal lesion, or hydronephrosis. Bladder is unremarkable. Stomach/Bowel: Balloon retention gastrostomy catheter is again seen with its tip within the post pyloric duodenal bulb. The stomach is moderately fluid distended  suggesting at least some degree of gastric outlet obstruction. Severe sigmoid diverticulosis. Stomach, small bowel, large bowel are otherwise unremarkable. Appendix normal. No free intraperitoneal gas or fluid. Vascular/Lymphatic: Extensive aortoiliac atherosclerotic calcification. Vascular stent seen within the left common and external iliac arteries with wide patency of the stented segment. No pathologic adenopathy within the abdomen and pelvis. Reproductive: Brachytherapy seeds are seen within the prostate gland Other: No abdominal wall hernia Musculoskeletal: Osseous structures are diffusely osteopenic. Remote L1 compression deformity again noted. Advanced degenerative changes are seen throughout the lumbar spine. Right posterior acetabular wall ORIF has been performed. No acute bone abnormality. No lytic or blastic bone lesion. IMPRESSION: 1. Interval development of extensive airway impaction within the right middle and lower lobes with discoid atelectasis within the right lower lobe. Given findings within the stomach, this may relate to aspiration. 2. Extensive multi-vessel coronary  artery calcification. 3. Balloon retention gastrostomy catheter with its tip within the post pyloric duodenal bulb. The stomach is moderately fluid distended suggesting at least some degree of gastric outlet obstruction. Repositioning is recommended. 4. Severe sigmoid diverticulosis without superimposed acute inflammatory change. Aortic Atherosclerosis (ICD10-I70.0). Electronically Signed   By: Helyn Numbers M.D.   On: 10/01/2023 00:18   DG Chest 2 View  Result Date: 09/30/2023 CLINICAL DATA:  Dyspnea EXAM: CHEST - 2 VIEW COMPARISON:  06/26/2023 FINDINGS: Discoid atelectasis right base. Lungs are otherwise clear. No pneumothorax or pleural effusion. Cardiac size within limits. The stent graft noted within the aortic arch unchanged. Healed bilateral rib fractures noted. No acute bone. IMPRESSION: 1. Right basilar discoid atelectasis. Electronically Signed   By: Helyn Numbers M.D.   On: 09/30/2023 21:48   IR Replc Gastro/Colonic Tube Percut W/Fluoro  Result Date: 09/04/2023 INDICATION: Indwelling 24 French percutaneous gastrostomy tube placed operatively on 03/19/2023. The patient has continued need for supplemental nutrition and request has been made to remove the current tube and size the patient for a low-profile balloon retention gastrostomy tube. EXAM: EXCHANGE OF PERCUTANEOUS GASTROSTOMY TUBE UNDER FLUOROSCOPY MEDICATIONS: None ANESTHESIA/SEDATION: None CONTRAST:  15 mL Omnipaque 300-administered into the gastric lumen. FLUOROSCOPY TIME:  Fluoroscopy Time: 12 seconds.  0.1 mGy. COMPLICATIONS: None immediate. PROCEDURE: Informed written consent was obtained from the patient after a thorough discussion of the procedural risks, benefits and alternatives. All questions were addressed. Maximal Sterile Barrier Technique was utilized including caps, mask, sterile gowns, sterile gloves, sterile drape, hand hygiene and skin antiseptic. A timeout was performed prior to the initiation of the procedure. Viscous  lidocaine was injected into the track surrounding a pre-existing bumper retention 24 French gastrostomy tube. The gastrostomy tube was then removed in its entirety while applying manual traction. A pediatric feeding tube with measurement markers was then utilized in measuring the distance from the skin to the gastric wall in order to custom order an appropriate sized low-profile balloon retention gastrostomy tube. A 24 French, 2.5 cm stomal length tube will be custom ordered for the patient. In the meantime, a standard 24 French balloon retention gastrostomy tube was placed through the tract and advanced into the stomach. The retention balloon was inflated with 10 mL of saline. Contrast injection confirms intraluminal positioning within the body of the stomach under fluoroscopy and a fluoroscopic image was saved. IMPRESSION: 1. Removal of existing 24 [place] bumper retention gastrostomy tube. 2. Measurement performed for appropriate sized low-profile balloon retention gastrostomy tube. A 24 French, 2.5 cm stomal length tube will be ordered for the patient and he will be notified once  the tube is received and scheduled for gastrostomy replacement. 3. A new 24 French standard balloon retention gastrostomy tube was placed through the tract and into the body of the stomach and may be immediately used. Electronically Signed   By: Irish Lack M.D.   On: 09/04/2023 11:58       Subjective: No significant events overnight, reports some left wrist pain, eager to go home early today.  Discharge Exam: Vitals:   10/04/23 0754 10/04/23 0800  BP:  (!) 151/65  Pulse:  71  Resp:  20  Temp:  97.8 F (36.6 C)  SpO2: 95%    Vitals:   10/04/23 0000 10/04/23 0400 10/04/23 0754 10/04/23 0800  BP: 137/70 (!) 143/87  (!) 151/65  Pulse: 80 78  71  Resp: 20 (!) 21  20  Temp: 98.9 F (37.2 C) 99.7 F (37.6 C)  97.8 F (36.6 C)  TempSrc: Oral Axillary  Oral  SpO2: 94% 94% 95%   Weight:      Height:         General: Pt is alert, awake, not in acute distress, frail, deconditioned and thin appearing. Cardiovascular: RRR, S1/S2 +, no rubs, no gallops Respiratory: CTA bilaterally, no wheezing, no rhonchi Abdominal: Soft, NT, ND, bowel sounds + Extremities: no edema, no cyanosis, left wrist mildly warm, but no swelling or effusion.    The results of significant diagnostics from this hospitalization (including imaging, microbiology, ancillary and laboratory) are listed below for reference.     Microbiology: No results found for this or any previous visit (from the past 240 hour(s)).   Labs: BNP (last 3 results) Recent Labs    10/01/23 0024  BNP 116.2*   Basic Metabolic Panel: Recent Labs  Lab 09/30/23 1604 10/01/23 0426 10/01/23 1818 10/02/23 0351 10/02/23 1722 10/03/23 0418 10/04/23 0349  NA 136 141  --  140  --  142 139  K 4.1 4.1  --  3.0*  --  3.7 3.4*  CL 101 102  --  104  --  108 108  CO2 23 24  --  28  --  27 27  GLUCOSE 109* 127*  --  149*  --  136* 149*  BUN 23 29*  --  36*  --  29* 30*  CREATININE 0.80 0.85  --  0.76  --  0.68 0.70  CALCIUM 9.6 9.5  --  8.8*  --  8.6* 8.2*  MG  --   --  1.9 2.1 2.1 1.9  --   PHOS  --   --  2.8 2.8 2.6 3.1  --    Liver Function Tests: Recent Labs  Lab 09/30/23 1604  AST 27  ALT 20  ALKPHOS 45  BILITOT 0.8  PROT 7.2  ALBUMIN 3.8   Recent Labs  Lab 09/30/23 1604  LIPASE 29   No results for input(s): "AMMONIA" in the last 168 hours. CBC: Recent Labs  Lab 10/01/23 1229 10/01/23 1934 10/02/23 0351 10/02/23 1100 10/04/23 0349  WBC 14.5* 12.0* 9.3 10.2 7.8  HGB 12.2* 10.2* 10.0* 10.2* 9.1*  HCT 36.2* 30.9* 30.6* 32.0* 28.6*  MCV 98.4 96.9 99.7 101.3* 99.0  PLT 194 169 151 155 146*   Cardiac Enzymes: No results for input(s): "CKTOTAL", "CKMB", "CKMBINDEX", "TROPONINI" in the last 168 hours. BNP: Invalid input(s): "POCBNP" CBG: Recent Labs  Lab 10/03/23 0030 10/03/23 2003 10/04/23 0029 10/04/23 0424  10/04/23 0834  GLUCAP 119* 118* 85 108* 109*   D-Dimer No results for  input(s): "DDIMER" in the last 72 hours. Hgb A1c No results for input(s): "HGBA1C" in the last 72 hours. Lipid Profile No results for input(s): "CHOL", "HDL", "LDLCALC", "TRIG", "CHOLHDL", "LDLDIRECT" in the last 72 hours. Thyroid function studies No results for input(s): "TSH", "T4TOTAL", "T3FREE", "THYROIDAB" in the last 72 hours.  Invalid input(s): "FREET3" Anemia work up No results for input(s): "VITAMINB12", "FOLATE", "FERRITIN", "TIBC", "IRON", "RETICCTPCT" in the last 72 hours. Urinalysis    Component Value Date/Time   COLORURINE YELLOW 10/01/2023 0003   APPEARANCEUR HAZY (A) 10/01/2023 0003   LABSPEC 1.032 (H) 10/01/2023 0003   PHURINE 7.0 10/01/2023 0003   GLUCOSEU NEGATIVE 10/01/2023 0003   HGBUR NEGATIVE 10/01/2023 0003   BILIRUBINUR NEGATIVE 10/01/2023 0003   KETONESUR 20 (A) 10/01/2023 0003   PROTEINUR 30 (A) 10/01/2023 0003   NITRITE NEGATIVE 10/01/2023 0003   LEUKOCYTESUR NEGATIVE 10/01/2023 0003   Sepsis Labs Recent Labs  Lab 10/01/23 1934 10/02/23 0351 10/02/23 1100 10/04/23 0349  WBC 12.0* 9.3 10.2 7.8   Microbiology No results found for this or any previous visit (from the past 240 hour(s)).   Time coordinating discharge: Over 30 minutes  SIGNED:   Huey Bienenstock, MD  [facility] 10/04/2023, 10:50 AM Pager   If 7PM-7AM, please contact night-coverage [email or web address] Password [email or web address]

## 2023-10-04 NOTE — Progress Notes (Signed)
Discharge instructions given with stated understanding 

## 2023-10-04 NOTE — Progress Notes (Signed)
[facility] night cross cover note:   I was notified by RN of the patient's potassium level of 3.4 this morning. He is noted to have peg tube in place.   I subsequently ordered 40 meq of the powder form of potassium chloride per peg x 1 dose now.      Newton Pigg, DO Hospitalist

## 2023-10-04 NOTE — Discharge Instructions (Signed)
Follow with Primary MD Annamaria Helling, DO in 7 days   Get CBC, CMP, 2 view Chest X ray checked  by Primary MD next visit.    Activity: As tolerated with Full fall precautions use walker/cane & assistance as needed   Disposition Home    Diet: All food and meds via PEG   On your next visit with your primary care physician please Get Medicines reviewed and adjusted.   Please request your Prim.MD to go over all Hospital Tests and Procedure/Radiological results at the follow up, please get all Hospital records sent to your Prim MD by signing hospital release before you go home.   If you experience worsening of your admission symptoms, develop shortness of breath, life threatening emergency, suicidal or homicidal thoughts you must seek medical attention immediately by calling 911 or calling your MD immediately  if symptoms less severe.  You Must read complete instructions/literature along with all the possible adverse reactions/side effects for all the Medicines you take and that have been prescribed to you. Take any new Medicines after you have completely understood and accpet all the possible adverse reactions/side effects.   Do not drive, operating heavy machinery, perform activities at heights, swimming or participation in water activities or provide baby sitting services if your were admitted for syncope or siezures until you have seen by Primary MD or a Neurologist and advised to do so again.  Do not drive when taking Pain medications.    Do not take more than prescribed Pain, Sleep and Anxiety Medications  Special Instructions: If you have smoked or chewed Tobacco  in the last 2 yrs please stop smoking, stop any regular Alcohol  and or any Recreational drug use.  Wear Seat belts while driving.   Please note  You were cared for by a hospitalist during your hospital stay. If you have any questions about your discharge medications or the care you received while you were in the  hospital after you are discharged, you can call the unit and asked to speak with the hospitalist on call if the hospitalist that took care of you is not available. Once you are discharged, your primary care physician will handle any further medical issues. Please note that NO REFILLS for any discharge medications will be authorized once you are discharged, as it is imperative that you return to your primary care physician (or establish a relationship with a primary care physician if you do not have one) for your aftercare needs so that they can reassess your need for medications and monitor your lab values.

## 2023-10-04 NOTE — Progress Notes (Signed)
Referring Physician(s): Dr. Randol Kern  Supervising Physician: Simonne Come  Patient Status:  [facility] - In-pt  Chief Complaint: Leaking G-tube  Subjective: Resting in chair.  States he would like his tube to be checked.  He is concerned about ongoing leakage.   Allergies: Metoprolol and Morphine  Medications: Prior to Admission medications   Medication Sig Start Date End Date Taking? Authorizing Provider  allopurinol (ZYLOPRIM) 100 MG tablet Place 100 mg into feeding tube daily. 09/13/13  Yes [provider]  amoxicillin-clavulanate (AUGMENTIN) 600-42.9 MG/5ML suspension Place 5 mLs (600 mg total) into feeding tube 2 (two) times daily for 2 days. Discard remainder 10/04/23 10/06/23 Yes Elgergawy, Leana Roe, MD  aspirin 81 MG chewable tablet Place 81 mg into feeding tube daily.   Yes [provider]  celecoxib (CELEBREX) 200 MG capsule Place 200 mg into feeding tube daily.   Yes [provider]  Cyanocobalamin (VITAMIN B12 PO) Give 5 mg by tube daily.   Yes [provider]  docusate (COLACE) 50 MG/5ML liquid Place 10 mLs (100 mg total) into feeding tube daily. 03/25/23  Yes Rhyne, Ames Coupe, PA-C  famotidine (PEPCID) 40 MG tablet Place 1 tablet (40 mg total) into feeding tube daily. 03/25/23  Yes Rhyne, Ames Coupe, PA-C  Ferrous Sulfate (IRON) 325 (65 Fe) MG TABS 65 mg by Feeding Tube route daily.   Yes [provider]  finasteride (PROSCAR) 5 MG tablet 5 mg. Place 1 tablet into the feeding tube daily 08/19/23  Yes [provider]  fluticasone (FLONASE) 50 MCG/ACT nasal spray Place 2 sprays into the nose daily. 12/01/10  Yes [provider]  loratadine (CLARITIN) 10 MG tablet Place 1 tablet (10 mg total) into feeding tube daily. 03/24/23  Yes Rhyne, Ames Coupe, PA-C  MAGNESIUM PO 30 mLs by Feeding Tube route daily.   Yes [provider]  mirtazapine (REMERON) 15 MG tablet Place 15 mg into feeding tube at bedtime.   Yes  [provider]  Multiple Vitamin (MULTIVITAMIN) capsule Place 1 capsule into feeding tube daily. 03/24/23  Yes Rhyne, Ames Coupe, PA-C  nitroGLYCERIN (NITROSTAT) 0.4 MG SL tablet Place 1 tablet (0.4 mg total) under the tongue every 5 (five) minutes as needed for chest pain. 01/29/22  Yes Revankar, Aundra Dubin, MD  Nutritional Supplements (FEEDING SUPPLEMENT, OSMOLITE 1.5 CAL,) LIQD Place 237 mLs into feeding tube 5 (five) times daily.   Yes [provider]  pravastatin (PRAVACHOL) 40 MG tablet Take 1 tablet (40 mg total) by mouth daily. Patient taking differently: Place 40 mg into feeding tube daily. 04/01/23  Yes Revankar, Aundra Dubin, MD  primidone (MYSOLINE) 50 MG tablet Place 1 tablet (50 mg total) into feeding tube at bedtime. 03/24/23  Yes Rhyne, Samantha J, PA-C  senna-docusate (SENOKOT-S) 8.6-50 MG tablet Place 1 tablet into feeding tube at bedtime as needed for mild constipation. 03/24/23  Yes Rhyne, Ames Coupe, PA-C  traMADol (ULTRAM) 50 MG tablet Place 1 tablet (50 mg total) into feeding tube every 6 (six) hours as needed for moderate pain. 03/24/23  Yes Rhyne, Samantha J, PA-C  amLODipine (NORVASC) 5 MG tablet Place 1 tablet (5 mg total) into feeding tube daily as directed. 10/04/23   Elgergawy, Leana Roe, MD  budesonide-formoterol (SYMBICORT) 160-4.5 MCG/ACT inhaler Inhale 2 puffs into the lungs daily.    [provider]     Vital Signs: BP (!) 151/65 (BP Location: Left Arm)   Pulse 71   Temp 97.8 F (36.6 C) (  Oral)   Resp 20   Ht 5\' 10"  (1.778 m)   Wt 132 lb 4.4 oz (60 kg)   SpO2 95%   BMI 18.98 kg/m   Physical Exam NAD, alert Abdomen: G-tube assessed while patient sitting in chair.  It is tightly secured, Mic-key. No issues noted.  Dressing dated yesterday is clean and dry.  There is a small amount of drainage on left side of dressing which is minimal.  Barrier cream in place. TFs infusing without issue.   Imaging: IR Replc Gastro/Colonic Tube Percut  W/Fluoro  Result Date: 10/01/2023 INDICATION: 82 year old male presents for exchange of indwelling gastrostomy for his personalized low-profile 24 [place], 2.5 stomal length Mickey button gastrostomy EXAM: GASTROSTOMY CATHETER REPLACEMENT MEDICATIONS: None ANESTHESIA/SEDATION: None CONTRAST:  5mL OMNIPAQUE IOHEXOL 300 MG/ML SOLN - administered into the gastric lumen. FLUOROSCOPY: Radiation Exposure Index (as provided by the fluoroscopic device): 1.2 mGy Kerma COMPLICATIONS: None PROCEDURE: Informed written consent was obtained from the patient and the patient's family after a thorough discussion of the procedural risks, benefits and alternatives. All questions were addressed. Clean technique was utilized including caps, mask, sterile gowns, sterile gloves, sterile drape, hand hygiene and skin antiseptic. A timeout was performed prior to the initiation of the procedure. The indwelling percutaneous gastrostomy was removed after deflating the balloon. The new 24 French 2.5 stomal length Mickey button low-profile gastrostomy was placed through the stoma. Balloon was inflated. Contrast was injected confirming location. Final image was stored. Patient tolerated the procedure well and remained hemodynamically stable throughout. No complications were encountered and no significant blood loss encountered. IMPRESSION: Image guided exchange of gastrostomy, with placement of the patient's personalized low-profile Mickey button 24 French 2.5 cm stomal length gastrostomy. Electronically Signed   By: Gilmer Mor D.O.   On: 10/01/2023 13:52   DG Abd Portable 1V  Result Date: 10/01/2023 CLINICAL DATA:  G-tube repositioning post CT EXAM: PORTABLE ABDOMEN - 1 VIEW COMPARISON:  CT abdomen and pelvis 09/30/2023 FINDINGS: Enteric contrast injected through the gastrostomy tube is present within the first and second portions of the duodenum. No contrast is visualized within the stomach. The balloon catheter is not outlined by  contrast. No evidence of bowel obstruction. Contrast from prior studies within the bladder and rectum. Left common iliac artery stent. Screw in the posterior right acetabulum. IMPRESSION: Enteric contrast injected through the G-tube is within the first and second portions of the duodenum. No enteric contrast within the stomach. Electronically Signed   By: Minerva Fester M.D.   On: 10/01/2023 03:12   DG Chest Port 1 View  Result Date: 10/01/2023 CLINICAL DATA:  Aspiration EXAM: PORTABLE CHEST 1 VIEW COMPARISON:  CT 09/30/2023, 03/26/2023 FINDINGS: Coronary stent. Stent graft at the aortic arch. Right infrahilar bandlike opacity, corresponding to CT demonstrated right lower lobe atelectasis stable cardiomediastinal silhouette. No pneumothorax. Old rib fractures IMPRESSION: Right infrahilar bandlike opacity, corresponding to CT demonstrated right lower lobe atelectasis. Electronically Signed   By: Jasmine Pang M.D.   On: 10/01/2023 01:38   CT ABDOMEN PELVIS W CONTRAST  Result Date: 10/01/2023 CLINICAL DATA:  Acute nonlocalized abdominal pain EXAM: CT ABDOMEN AND PELVIS WITH CONTRAST TECHNIQUE: Multidetector CT imaging of the abdomen and pelvis was performed using the standard protocol following bolus administration of intravenous contrast. RADIATION DOSE REDUCTION: This exam was performed according to the departmental dose-optimization program which includes automated exposure control, adjustment of the mA and/or kV according to patient size and/or use of iterative reconstruction technique. CONTRAST:  75mL  OMNIPAQUE IOHEXOL 350 MG/ML SOLN COMPARISON:  09/29/2023 FINDINGS: Lower chest: There has developed extensive airway impaction within the right middle and lower lobes with discoid atelectasis within the right lower lobe. No focal consolidation. No pleural effusion. Extensive multi-vessel coronary artery calcification. Hepatobiliary: No focal liver abnormality is seen. No gallstones, gallbladder wall  thickening, or biliary dilatation. Pancreas: Unremarkable Spleen: Unremarkable Adrenals/Urinary Tract: Adrenal glands are unremarkable. Kidneys are normal, without renal calculi, focal lesion, or hydronephrosis. Bladder is unremarkable. Stomach/Bowel: Balloon retention gastrostomy catheter is again seen with its tip within the post pyloric duodenal bulb. The stomach is moderately fluid distended suggesting at least some degree of gastric outlet obstruction. Severe sigmoid diverticulosis. Stomach, small bowel, large bowel are otherwise unremarkable. Appendix normal. No free intraperitoneal gas or fluid. Vascular/Lymphatic: Extensive aortoiliac atherosclerotic calcification. Vascular stent seen within the left common and external iliac arteries with wide patency of the stented segment. No pathologic adenopathy within the abdomen and pelvis. Reproductive: Brachytherapy seeds are seen within the prostate gland Other: No abdominal wall hernia Musculoskeletal: Osseous structures are diffusely osteopenic. Remote L1 compression deformity again noted. Advanced degenerative changes are seen throughout the lumbar spine. Right posterior acetabular wall ORIF has been performed. No acute bone abnormality. No lytic or blastic bone lesion. IMPRESSION: 1. Interval development of extensive airway impaction within the right middle and lower lobes with discoid atelectasis within the right lower lobe. Given findings within the stomach, this may relate to aspiration. 2. Extensive multi-vessel coronary artery calcification. 3. Balloon retention gastrostomy catheter with its tip within the post pyloric duodenal bulb. The stomach is moderately fluid distended suggesting at least some degree of gastric outlet obstruction. Repositioning is recommended. 4. Severe sigmoid diverticulosis without superimposed acute inflammatory change. Aortic Atherosclerosis (ICD10-I70.0). Electronically Signed   By: Helyn Numbers M.D.   On: 10/01/2023 00:18    DG Chest 2 View  Result Date: 09/30/2023 CLINICAL DATA:  Dyspnea EXAM: CHEST - 2 VIEW COMPARISON:  06/26/2023 FINDINGS: Discoid atelectasis right base. Lungs are otherwise clear. No pneumothorax or pleural effusion. Cardiac size within limits. The stent graft noted within the aortic arch unchanged. Healed bilateral rib fractures noted. No acute bone. IMPRESSION: 1. Right basilar discoid atelectasis. Electronically Signed   By: Helyn Numbers M.D.   On: 09/30/2023 21:48    Labs:  CBC: Recent Labs    10/01/23 1934 10/02/23 0351 10/02/23 1100 10/04/23 0349  WBC 12.0* 9.3 10.2 7.8  HGB 10.2* 10.0* 10.2* 9.1*  HCT 30.9* 30.6* 32.0* 28.6*  PLT 169 151 155 146*    COAGS: Recent Labs    03/07/23 1030 03/11/23 1742  INR 1.1 1.2  APTT 32 29    BMP: Recent Labs    10/01/23 0426 10/02/23 0351 10/03/23 0418 10/04/23 0349  NA 141 140 142 139  K 4.1 3.0* 3.7 3.4*  CL 102 104 108 108  CO2 24 28 27 27   GLUCOSE 127* 149* 136* 149*  BUN 29* 36* 29* 30*  CALCIUM 9.5 8.8* 8.6* 8.2*  CREATININE 0.85 0.76 0.68 0.70  GFRNONAA >60 >60 >60 >60    LIVER FUNCTION TESTS: Recent Labs    03/20/23 0446 03/26/23 2235 07/24/23 1320 09/30/23 1604  BILITOT 0.6 0.4 0.5 0.8  AST 30 20 20 27   ALT 19 20 15 20   ALKPHOS 90 96 45 45  PROT 5.5* 5.8* 6.6 7.2  ALBUMIN 2.1* 2.6* 3.4* 3.8    Assessment and Plan: Leaking G-tube, resolved with exchange to [facility] button G-tube assessed at bedside  prior to discharge home today.  Intact, dressing clean and dry with only minimal drainage.  Discussed healing and recovery from multiple recent exchanges in the setting of poor nutrition status.  Patient using barrier cream as appropriate- ok to continue.   Continue routine management and site care.   Electronically Signed: Hoyt Koch, PA 10/04/2023, 10:35 AM   I spent a total of 15 Minutes at the the patient's bedside AND on the patient's hospital floor or unit, greater than 50%  of which was counseling/coordinating care for leaking G-tube.

## 2023-10-04 NOTE — Plan of Care (Signed)
  Problem: Education: Goal: Knowledge of discharge needs will improve Outcome: Completed/Met   Problem: Clinical Measurements: Goal: Postoperative complications will be avoided or minimized Outcome: Completed/Met   Problem: Respiratory: Goal: Ability to achieve and maintain a regular respiratory rate will improve Outcome: Completed/Met   Problem: Skin Integrity: Goal: Demonstration of wound healing without infection will improve Outcome: Completed/Met   Problem: Education: Goal: Knowledge of General Education information will improve Description: Including pain rating scale, medication(s)/side effects and non-pharmacologic comfort measures Outcome: Completed/Met   Problem: Health Behavior/Discharge Planning: Goal: Ability to manage health-related needs will improve Outcome: Completed/Met   Problem: Clinical Measurements: Goal: Ability to maintain clinical measurements within normal limits will improve Outcome: Completed/Met Goal: Will remain free from infection Outcome: Completed/Met Goal: Diagnostic test results will improve Outcome: Completed/Met Goal: Respiratory complications will improve Outcome: Completed/Met Goal: Cardiovascular complication will be avoided Outcome: Completed/Met   Problem: Activity: Goal: Risk for activity intolerance will decrease Outcome: Completed/Met   Problem: Nutrition: Goal: Adequate nutrition will be maintained Outcome: Completed/Met   Problem: Coping: Goal: Level of anxiety will decrease Outcome: Completed/Met   Problem: Elimination: Goal: Will not experience complications related to bowel motility Outcome: Completed/Met Goal: Will not experience complications related to urinary retention Outcome: Completed/Met   Problem: Pain Management: Goal: General experience of comfort will improve Outcome: Completed/Met   Problem: Safety: Goal: Ability to remain free from injury will improve Outcome: Completed/Met   Problem: Skin  Integrity: Goal: Risk for impaired skin integrity will decrease Outcome: Completed/Met   Problem: Activity: Goal: Ability to tolerate increased activity will improve Outcome: Completed/Met   Problem: Clinical Measurements: Goal: Ability to maintain a body temperature in the normal range will improve Outcome: Completed/Met   Problem: Respiratory: Goal: Ability to maintain adequate ventilation will improve Outcome: Completed/Met Goal: Ability to maintain a clear airway will improve Outcome: Completed/Met

## 2023-10-04 NOTE — TOC Transition Note (Signed)
Transition of Care [facility]) - CM/SW Discharge Note   Patient Details  Name: Brian Dunn MRN: [id number] Date of Birth: 10-01-1941  [facility] CM/SW Contact:  Lawerance Sabal, RN Phone Number: 10/04/2023, 10:38 AM   Clinical Narrative:     Brian Dunn w patient's wife, Brian Dunn, over the phone.  She states that she will provide transportation home. Confirms patient is active w [facility]. I have spoken to the liaison to notify branch that he will DC today and resume services.  Wife states that they have needed DME at home Rollator, shower seat, and "plenty of food." (Tube feeds)   Final next level of care: Home w Home Health Services Barriers to Discharge: No Barriers Identified   Patient Goals and CMS Choice CMS Medicare.gov Compare Post Acute Care list provided to:: Other (Comment Required) Choice offered to / list presented to : Spouse  Discharge Placement                         Discharge Plan and Services Additional resources added to the After Visit Summary for                  DME Arranged: N/A           HH Agency: [facility] Date [facility] Agency Contacted: 10/04/23 Time HH Agency Contacted: 1038 Representative spoke with at [facility] Agency: Laurelyn Sickle  Social Determinants of Health (SDOH) Interventions SDOH Screenings   Food Insecurity: No Food Insecurity (10/01/2023)  Housing: Low Risk  (10/01/2023)  Transportation Needs: No Transportation Needs (10/01/2023)  Utilities: Not At Risk (10/01/2023)  Depression (PHQ2-9): Low Risk  (04/14/2023)  Stress: No Stress Concern Present (04/14/2023)  Tobacco Use: Medium Risk (10/01/2023)     Readmission Risk Interventions    03/24/2023    3:23 PM  Readmission Risk Prevention Plan  Post Dischage Appt Complete  Medication Screening Complete  Transportation Screening Complete

## 2023-10-06 DIAGNOSIS — Z7982 Long term (current) use of aspirin: Secondary | ICD-10-CM | POA: Diagnosis not present

## 2023-10-06 DIAGNOSIS — E785 Hyperlipidemia, unspecified: Secondary | ICD-10-CM | POA: Diagnosis not present

## 2023-10-06 DIAGNOSIS — R911 Solitary pulmonary nodule: Secondary | ICD-10-CM | POA: Diagnosis not present

## 2023-10-06 DIAGNOSIS — J4489 Other specified chronic obstructive pulmonary disease: Secondary | ICD-10-CM | POA: Diagnosis not present

## 2023-10-06 DIAGNOSIS — H903 Sensorineural hearing loss, bilateral: Secondary | ICD-10-CM | POA: Diagnosis not present

## 2023-10-06 DIAGNOSIS — M109 Gout, unspecified: Secondary | ICD-10-CM | POA: Diagnosis not present

## 2023-10-06 DIAGNOSIS — I2510 Atherosclerotic heart disease of native coronary artery without angina pectoris: Secondary | ICD-10-CM | POA: Diagnosis not present

## 2023-10-06 DIAGNOSIS — I129 Hypertensive chronic kidney disease with stage 1 through stage 4 chronic kidney disease, or unspecified chronic kidney disease: Secondary | ICD-10-CM | POA: Diagnosis not present

## 2023-10-06 DIAGNOSIS — Z431 Encounter for attention to gastrostomy: Secondary | ICD-10-CM | POA: Diagnosis not present

## 2023-10-06 DIAGNOSIS — K219 Gastro-esophageal reflux disease without esophagitis: Secondary | ICD-10-CM | POA: Diagnosis not present

## 2023-10-06 DIAGNOSIS — M8580 Other specified disorders of bone density and structure, unspecified site: Secondary | ICD-10-CM | POA: Diagnosis not present

## 2023-10-06 DIAGNOSIS — D631 Anemia in chronic kidney disease: Secondary | ICD-10-CM | POA: Diagnosis not present

## 2023-10-06 DIAGNOSIS — Z8701 Personal history of pneumonia (recurrent): Secondary | ICD-10-CM | POA: Diagnosis not present

## 2023-10-06 DIAGNOSIS — E43 Unspecified severe protein-calorie malnutrition: Secondary | ICD-10-CM | POA: Diagnosis not present

## 2023-10-06 DIAGNOSIS — Z79899 Other long term (current) drug therapy: Secondary | ICD-10-CM | POA: Diagnosis not present

## 2023-10-06 DIAGNOSIS — Q2549 Other congenital malformations of aorta: Secondary | ICD-10-CM | POA: Diagnosis not present

## 2023-10-06 DIAGNOSIS — K5900 Constipation, unspecified: Secondary | ICD-10-CM | POA: Diagnosis not present

## 2023-10-06 DIAGNOSIS — Z7951 Long term (current) use of inhaled steroids: Secondary | ICD-10-CM | POA: Diagnosis not present

## 2023-10-06 DIAGNOSIS — N189 Chronic kidney disease, unspecified: Secondary | ICD-10-CM | POA: Diagnosis not present

## 2023-10-06 DIAGNOSIS — R1312 Dysphagia, oropharyngeal phase: Secondary | ICD-10-CM | POA: Diagnosis not present

## 2023-10-06 DIAGNOSIS — Z955 Presence of coronary angioplasty implant and graft: Secondary | ICD-10-CM | POA: Diagnosis not present

## 2023-10-06 DIAGNOSIS — G250 Essential tremor: Secondary | ICD-10-CM | POA: Diagnosis not present

## 2023-10-08 DIAGNOSIS — M109 Gout, unspecified: Secondary | ICD-10-CM | POA: Diagnosis not present

## 2023-10-08 LAB — LAB REPORT - SCANNED: EGFR: 60

## 2023-10-09 ENCOUNTER — Telehealth (HOSPITAL_COMMUNITY): Payer: Self-pay

## 2023-10-09 NOTE — Telephone Encounter (Signed)
Received an order for peg placement. Pt just had peg replaced on 11/27. Called PCP to find out if this is still needed. AB

## 2023-10-10 ENCOUNTER — Encounter (HOSPITAL_COMMUNITY): Payer: Self-pay

## 2023-10-10 ENCOUNTER — Emergency Department (HOSPITAL_COMMUNITY)
Admission: EM | Admit: 2023-10-10 | Discharge: 2023-10-10 | Disposition: A | Discharge status: Home / Self Care | Payer: Medicare HMO | Attending: Emergency Medicine | Admitting: Emergency Medicine

## 2023-10-10 ENCOUNTER — Other Ambulatory Visit: Payer: Self-pay

## 2023-10-10 ENCOUNTER — Emergency Department (HOSPITAL_COMMUNITY): Payer: Medicare HMO

## 2023-10-10 DIAGNOSIS — M109 Gout, unspecified: Secondary | ICD-10-CM | POA: Diagnosis not present

## 2023-10-10 DIAGNOSIS — K219 Gastro-esophageal reflux disease without esophagitis: Secondary | ICD-10-CM | POA: Diagnosis not present

## 2023-10-10 DIAGNOSIS — I2510 Atherosclerotic heart disease of native coronary artery without angina pectoris: Secondary | ICD-10-CM | POA: Insufficient documentation

## 2023-10-10 DIAGNOSIS — R1033 Periumbilical pain: Secondary | ICD-10-CM | POA: Insufficient documentation

## 2023-10-10 DIAGNOSIS — N3289 Other specified disorders of bladder: Secondary | ICD-10-CM | POA: Diagnosis not present

## 2023-10-10 DIAGNOSIS — G250 Essential tremor: Secondary | ICD-10-CM | POA: Diagnosis not present

## 2023-10-10 DIAGNOSIS — Z7982 Long term (current) use of aspirin: Secondary | ICD-10-CM | POA: Diagnosis not present

## 2023-10-10 DIAGNOSIS — J4489 Other specified chronic obstructive pulmonary disease: Secondary | ICD-10-CM | POA: Diagnosis not present

## 2023-10-10 DIAGNOSIS — K5900 Constipation, unspecified: Secondary | ICD-10-CM | POA: Diagnosis not present

## 2023-10-10 DIAGNOSIS — R911 Solitary pulmonary nodule: Secondary | ICD-10-CM | POA: Diagnosis not present

## 2023-10-10 DIAGNOSIS — K9423 Gastrostomy malfunction: Secondary | ICD-10-CM

## 2023-10-10 DIAGNOSIS — I959 Hypotension, unspecified: Secondary | ICD-10-CM | POA: Diagnosis not present

## 2023-10-10 DIAGNOSIS — K5730 Diverticulosis of large intestine without perforation or abscess without bleeding: Secondary | ICD-10-CM | POA: Diagnosis not present

## 2023-10-10 DIAGNOSIS — E43 Unspecified severe protein-calorie malnutrition: Secondary | ICD-10-CM | POA: Diagnosis not present

## 2023-10-10 DIAGNOSIS — K9429 Other complications of gastrostomy: Secondary | ICD-10-CM | POA: Insufficient documentation

## 2023-10-10 DIAGNOSIS — N189 Chronic kidney disease, unspecified: Secondary | ICD-10-CM | POA: Diagnosis not present

## 2023-10-10 DIAGNOSIS — I10 Essential (primary) hypertension: Secondary | ICD-10-CM | POA: Diagnosis not present

## 2023-10-10 DIAGNOSIS — Z431 Encounter for attention to gastrostomy: Secondary | ICD-10-CM | POA: Diagnosis not present

## 2023-10-10 DIAGNOSIS — R1084 Generalized abdominal pain: Secondary | ICD-10-CM | POA: Diagnosis not present

## 2023-10-10 DIAGNOSIS — H903 Sensorineural hearing loss, bilateral: Secondary | ICD-10-CM | POA: Diagnosis not present

## 2023-10-10 DIAGNOSIS — R109 Unspecified abdominal pain: Secondary | ICD-10-CM | POA: Diagnosis not present

## 2023-10-10 DIAGNOSIS — Z79899 Other long term (current) drug therapy: Secondary | ICD-10-CM | POA: Insufficient documentation

## 2023-10-10 DIAGNOSIS — D631 Anemia in chronic kidney disease: Secondary | ICD-10-CM | POA: Diagnosis not present

## 2023-10-10 DIAGNOSIS — Z7951 Long term (current) use of inhaled steroids: Secondary | ICD-10-CM | POA: Diagnosis not present

## 2023-10-10 DIAGNOSIS — R1312 Dysphagia, oropharyngeal phase: Secondary | ICD-10-CM | POA: Diagnosis not present

## 2023-10-10 DIAGNOSIS — Q2549 Other congenital malformations of aorta: Secondary | ICD-10-CM | POA: Diagnosis not present

## 2023-10-10 DIAGNOSIS — Z955 Presence of coronary angioplasty implant and graft: Secondary | ICD-10-CM | POA: Diagnosis not present

## 2023-10-10 DIAGNOSIS — Z8701 Personal history of pneumonia (recurrent): Secondary | ICD-10-CM | POA: Diagnosis not present

## 2023-10-10 DIAGNOSIS — R55 Syncope and collapse: Secondary | ICD-10-CM | POA: Diagnosis not present

## 2023-10-10 DIAGNOSIS — R197 Diarrhea, unspecified: Secondary | ICD-10-CM | POA: Diagnosis not present

## 2023-10-10 DIAGNOSIS — I129 Hypertensive chronic kidney disease with stage 1 through stage 4 chronic kidney disease, or unspecified chronic kidney disease: Secondary | ICD-10-CM | POA: Diagnosis not present

## 2023-10-10 DIAGNOSIS — M8580 Other specified disorders of bone density and structure, unspecified site: Secondary | ICD-10-CM | POA: Diagnosis not present

## 2023-10-10 DIAGNOSIS — E785 Hyperlipidemia, unspecified: Secondary | ICD-10-CM | POA: Diagnosis not present

## 2023-10-10 LAB — CBC
HCT: 32.5 % — ABNORMAL LOW (ref 39.0–52.0)
Hemoglobin: 10.7 g/dL — ABNORMAL LOW (ref 13.0–17.0)
MCH: 32.4 pg (ref 26.0–34.0)
MCHC: 32.9 g/dL (ref 30.0–36.0)
MCV: 98.5 fL (ref 80.0–100.0)
Platelets: 264 10*3/uL (ref 150–400)
RBC: 3.3 MIL/uL — ABNORMAL LOW (ref 4.22–5.81)
RDW: 13.4 % (ref 11.5–15.5)
WBC: 7 10*3/uL (ref 4.0–10.5)
nRBC: 0 % (ref 0.0–0.2)

## 2023-10-10 LAB — COMPREHENSIVE METABOLIC PANEL
ALT: 41 U/L (ref 0–44)
AST: 38 U/L (ref 15–41)
Albumin: 3.3 g/dL — ABNORMAL LOW (ref 3.5–5.0)
Alkaline Phosphatase: 56 U/L (ref 38–126)
Anion gap: 10 (ref 5–15)
BUN: 41 mg/dL — ABNORMAL HIGH (ref 8–23)
CO2: 25 mmol/L (ref 22–32)
Calcium: 9.2 mg/dL (ref 8.9–10.3)
Chloride: 97 mmol/L — ABNORMAL LOW (ref 98–111)
Creatinine, Ser: 1.09 mg/dL (ref 0.61–1.24)
GFR, Estimated: >60 mL/min (ref >60)
Glucose, Bld: 86 mg/dL (ref 70–99)
Potassium: 4.8 mmol/L (ref 3.5–5.1)
Sodium: 132 mmol/L — ABNORMAL LOW (ref 135–145)
Total Bilirubin: 0.4 mg/dL (ref <1.2)
Total Protein: 6.9 g/dL (ref 6.5–8.1)

## 2023-10-10 LAB — TYPE AND SCREEN
ABO/RH(D): O POS
Antibody Screen: NEGATIVE

## 2023-10-10 LAB — TROPONIN I (HIGH SENSITIVITY)
Troponin I (High Sensitivity): 5 ng/L (ref <18)
Troponin I (High Sensitivity): 5 ng/L (ref <18)

## 2023-10-10 MED ORDER — FENTANYL CITRATE PF 50 MCG/ML IJ SOSY
50.0000 ug | PREFILLED_SYRINGE | Freq: Once | INTRAMUSCULAR | Status: AC
Start: 2023-10-10 — End: 2023-10-10
  Administered 2023-10-10: 50 ug via INTRAVENOUS
  Filled 2023-10-10: qty 1

## 2023-10-10 MED ORDER — ONDANSETRON HCL 4 MG/2ML IJ SOLN
4.0000 mg | Freq: Once | INTRAMUSCULAR | Status: AC
  Administered 2023-10-10: 4 mg via INTRAVENOUS
  Filled 2023-10-10: qty 2

## 2023-10-10 MED ORDER — OXYCODONE-ACETAMINOPHEN 5-325 MG PO TABS
1.0000 | ORAL_TABLET | Freq: Once | ORAL | Status: AC
  Administered 2023-10-10: 1
  Filled 2023-10-10: qty 1

## 2023-10-10 MED ORDER — FENTANYL CITRATE PF 50 MCG/ML IJ SOSY
50.0000 ug | PREFILLED_SYRINGE | Freq: Once | INTRAMUSCULAR | Status: AC
  Administered 2023-10-10: 50 ug via INTRAVENOUS
  Filled 2023-10-10: qty 1

## 2023-10-10 MED ORDER — IOHEXOL 350 MG/ML SOLN
75.0000 mL | Freq: Once | INTRAVENOUS | Status: AC | PRN
  Administered 2023-10-10: 75 mL via INTRAVENOUS

## 2023-10-10 MED ORDER — OXYCODONE-ACETAMINOPHEN 5-325 MG PO TABS: 1.0000 | ORAL_TABLET | Freq: Four times a day (QID) | ORAL | 0 refills | Status: DC | PRN

## 2023-10-10 MED ORDER — SODIUM CHLORIDE 0.9 % IV BOLUS
500.0000 mL | Freq: Once | INTRAVENOUS | Status: AC
  Administered 2023-10-10: 500 mL via INTRAVENOUS

## 2023-10-10 NOTE — Discharge Instructions (Addendum)
Thankfully today the CAT scan looked okay and your blood work looks good.  A lot of the pain may be coming from the constant leaking of the tube and the breakdown of your skin.  Make sure you are using a barrier cream regularly and changing the gauze regularly to keep it dry so that it can heal.  Also we changed you to a stronger pain medication.  Stop taking the tramadol and you can start taking the oxycodone.

## 2023-10-10 NOTE — ED Triage Notes (Signed)
Pt arrived via [facility] from home for c/o pain around peg tube that was placed 2wks ago and syncopal episode. Per EMS, wife and [facility] nurse stated t was hypotensive. HH nurse did a water flush through peg tube and that brought pt's bp up. Pt c/o black [id number]. Pt states he gets black diarrhea every time he take atx.

## 2023-10-10 NOTE — ED Notes (Signed)
ED Provider at bedside. 

## 2023-10-10 NOTE — ED Provider Notes (Signed)
[place] EMERGENCY DEPARTMENT AT [facility] Provider Note   CSN: [id number] Arrival date & time: 10/10/23  1349     History  Chief Complaint  Patient presents with   Abdominal Pain   Loss of Consciousness    Brian Dunn is a 82 y.o. male.  Pt is an 82y/o hypertension, hyperlipidemia, CAD, and dysphagia with PEG tube that was initially placed in May but then the plan was to have a Mickey button placed so that it would be more conspicuous and he could be more active.  However he had that done at the end of October and his wife says he has had issues ever since.  He has had ongoing abdominal pain and then started having vomiting blood and bleeding from the temporary tube site.  At the end of November they went to [facility] due to with the vomiting blood and blood coming from the tube site and he was sent to [facility] where he was found to have aspiration pneumonia, migration of his PEG tube and it was causing some bowel obstruction.  While hospitalized he received IV antibiotics and had the permanent tube placed by IR.  He has been home since 10/04/2023 and his wife says since being home he is continued to have pain in his abdomen not controlled with tramadol.  They are having difficulty with flushes and feedings reporting they are having to put Coke down it a lot and the flow is slow.  He is also leaking significant fluids around the tube.  They presented today because he had gotten up to go to the bathroom because he thought he was going to have diarrhea and when he stood up he became dizzy and passed out falling back into his chair.  He has not had any vomiting but continues to have dark stools but has not noticed significant bleeding from the tube site.  No fever.  Patient's wife feels that he is just getting weaker and weaker and his wife also feels that he is becoming more short of breath.  The patient reports not feeling specifically short of breath.  He has had an ongoing  cough but does not feel like it is worse.  They do have a referral out to see IR for potential change of the tube back to what he had in May but the appointment is not scheduled yet.  The history is provided by the patient, the spouse and medical records.  Abdominal Pain Loss of Consciousness      Home Medications Prior to Admission medications   Medication Sig Start Date End Date Taking? Authorizing Provider  oxyCODONE-acetaminophen (PERCOCET/ROXICET) 5-325 MG tablet Take 1 tablet by mouth every 6 (six) hours as needed for severe pain (pain score 7-10). 10/10/23  Yes Haiden Rawlinson, Alphonzo Lemmings, MD  allopurinol (ZYLOPRIM) 100 MG tablet Place 100 mg into feeding tube daily. 09/13/13   [provider]  amLODipine (NORVASC) 5 MG tablet Place 1 tablet (5 mg total) into feeding tube daily as directed. 10/04/23   Elgergawy, Leana Roe, MD  aspirin 81 MG chewable tablet Place 81 mg into feeding tube daily.    [provider]  budesonide-formoterol (SYMBICORT) 160-4.5 MCG/ACT inhaler Inhale 2 puffs into the lungs daily.    [provider]  celecoxib (CELEBREX) 200 MG capsule Place 200 mg into feeding tube daily.    [provider]  colchicine 0.6 MG tablet Take 0.5 tablets (0.3 mg total) by mouth daily. 10/04/23   Elgergawy, Mliss Fritz  S, MD  Cyanocobalamin (VITAMIN B12 PO) Give 5 mg by tube daily.    [provider]  docusate (COLACE) 50 MG/5ML liquid Place 10 mLs (100 mg total) into feeding tube daily. 03/25/23   Rhyne, Ames Coupe, PA-C  famotidine (PEPCID) 40 MG tablet Place 1 tablet (40 mg total) into feeding tube daily. 03/25/23   Rhyne, Ames Coupe, PA-C  Ferrous Sulfate (IRON) 325 (65 Fe) MG TABS 65 mg by Feeding Tube route daily.    [provider]  finasteride (PROSCAR) 5 MG tablet 5 mg. Place 1 tablet into the feeding tube daily 08/19/23   [provider]  fluticasone (FLONASE) 50 MCG/ACT nasal spray Place 2 sprays into the nose daily. 12/01/10    [provider]  loratadine (CLARITIN) 10 MG tablet Place 1 tablet (10 mg total) into feeding tube daily. 03/24/23   Rhyne, Ames Coupe, PA-C  MAGNESIUM PO 30 mLs by Feeding Tube route daily.    [provider]  mirtazapine (REMERON) 15 MG tablet Place 15 mg into feeding tube at bedtime.    [provider]  Multiple Vitamin (MULTIVITAMIN) capsule Place 1 capsule into feeding tube daily. 03/24/23   Rhyne, Ames Coupe, PA-C  nitroGLYCERIN (NITROSTAT) 0.4 MG SL tablet Place 1 tablet (0.4 mg total) under the tongue every 5 (five) minutes as needed for chest pain. 01/29/22   Revankar, Aundra Dubin, MD  Nutritional Supplements (FEEDING SUPPLEMENT, OSMOLITE 1.5 CAL,) LIQD Place 237 mLs into feeding tube 5 (five) times daily.    [provider]  pravastatin (PRAVACHOL) 40 MG tablet Take 1 tablet (40 mg total) by mouth daily. Patient taking differently: Place 40 mg into feeding tube daily. 04/01/23   Revankar, Aundra Dubin, MD  primidone (MYSOLINE) 50 MG tablet Place 1 tablet (50 mg total) into feeding tube at bedtime. 03/24/23   Rhyne, Ames Coupe, PA-C  senna-docusate (SENOKOT-S) 8.6-50 MG tablet Place 1 tablet into feeding tube at bedtime as needed for mild constipation. 03/24/23   Rhyne, Ames Coupe, PA-C  traMADol (ULTRAM) 50 MG tablet Place 1 tablet (50 mg total) into feeding tube every 6 (six) hours as needed for moderate pain. 03/24/23   Rhyne, Ames Coupe, PA-C      Allergies    Metoprolol and Morphine    Review of Systems   Review of Systems  Cardiovascular:  Positive for syncope.  Gastrointestinal:  Positive for abdominal pain.    Physical Exam Updated Vital Signs BP 132/72 (BP Location: Right Arm)   Pulse 83   Temp 98 F (36.7 C) (Oral)   Resp 17   Ht 5\' 10"  (1.778 m)   Wt 60 kg   SpO2 95%   BMI 18.98 kg/m  Physical Exam Vitals and nursing note reviewed.  Constitutional:      General: He is not in acute distress.    Appearance: He is well-developed.  HENT:      Head: Normocephalic and atraumatic.  Eyes:     Conjunctiva/sclera: Conjunctivae normal.     Pupils: Pupils are equal, round, and reactive to light.  Cardiovascular:     Rate and Rhythm: Normal rate and regular rhythm.     Heart sounds: No murmur heard. Pulmonary:     Effort: Pulmonary effort is normal. No respiratory distress.     Breath sounds: Normal breath sounds. No wheezing or rales.  Abdominal:     General: There is no distension.     Palpations: Abdomen is soft.     Tenderness:  There is no abdominal tenderness. There is no guarding or rebound.     Comments: Significant tenderness around the PEG tube site with significant fluid leaking around the base of the site with skin breakdown near the tube site and irritation.  No induration or fluctuance noted.  No purulence  Genitourinary:    Testes:        Right: Swelling not present.        Left: Swelling not present.  Musculoskeletal:        General: No tenderness. Normal range of motion.     Cervical back: Normal range of motion and neck supple.  Skin:    General: Skin is warm and dry.     Findings: No erythema or rash.  Neurological:     Mental Status: He is alert and oriented to person, place, and time.  Psychiatric:        Behavior: Behavior normal.     ED Results / Procedures / Treatments   Labs (all labs ordered are listed, but only abnormal results are displayed) Labs Reviewed  COMPREHENSIVE METABOLIC PANEL - Abnormal; Notable for the following components:      Result Value   Sodium 132 (*)    Chloride 97 (*)    BUN 41 (*)    Albumin 3.3 (*)    All other components within normal limits  CBC - Abnormal; Notable for the following components:   RBC 3.30 (*)    Hemoglobin 10.7 (*)    HCT 32.5 (*)    All other components within normal limits  POC OCCULT BLOOD, ED  TYPE AND SCREEN  TROPONIN I (HIGH SENSITIVITY)  TROPONIN I (HIGH SENSITIVITY)    EKG EKG Interpretation Date/Time:  Friday October 10 2023  13:46:56 EST Ventricular Rate:  78 PR Interval:  156 QRS Duration:  94 QT Interval:  396 QTC Calculation: 451 R Axis:   73  Text Interpretation: Normal sinus rhythm Normal ECG When compared with ECG of 01-Oct-2023 01:10, PREVIOUS ECG IS PRESENT Confirmed by Gwyneth Sprout ([id number]) on 10/10/2023 3:36:58 PM  Radiology CT ABDOMEN PELVIS W CONTRAST  Result Date: 10/10/2023 CLINICAL DATA:  History of recent gastrostomy button replacement with pain, initial encounter EXAM: CT ABDOMEN AND PELVIS WITH CONTRAST TECHNIQUE: Multidetector CT imaging of the abdomen and pelvis was performed using the standard protocol following bolus administration of intravenous contrast. RADIATION DOSE REDUCTION: This exam was performed according to the departmental dose-optimization program which includes automated exposure control, adjustment of the mA and/or kV according to patient size and/or use of iterative reconstruction technique. CONTRAST:  75mL OMNIPAQUE IOHEXOL 350 MG/ML SOLN COMPARISON:  09/30/2023 FINDINGS: Lower chest: Mild scarring is noted in the right lower lobe stable in appearance from the prior exam. No new focal abnormality is noted. Hepatobiliary: No focal liver abnormality is seen. No gallstones, gallbladder wall thickening, or biliary dilatation. Pancreas: Unremarkable. No pancreatic ductal dilatation or surrounding inflammatory changes. Spleen: Normal in size without focal abnormality. Adrenals/Urinary Tract: Adrenal glands are within normal limits. Kidneys demonstrate a normal enhancement pattern bilaterally. No obstructive changes are seen. The bladder is partially distended. Delayed images demonstrate normal excretion bilaterally. Stomach/Bowel: Stomach is decompressed. A short gastric Mickey button is noted in place within the gastric lumen. Air in fluid is noted throughout the colon. No obstructive or inflammatory changes are seen. The appendix is within normal limits. Scattered sigmoid diverticulosis  is noted without evidence of diverticulitis. No small bowel abnormality is noted. Vascular/Lymphatic: Aortic atherosclerosis. No enlarged abdominal  or pelvic lymph nodes. Left iliac arterial stent is again noted. Reproductive: Prostate is unremarkable. Brachytherapy seeds are again noted within the prostate. Other: No abdominal wall hernia or abnormality. No abdominopelvic ascites. Musculoskeletal: Chronic L1 compression deformity is noted. Diffuse multilevel degenerative changes of the thoracolumbar spine are seen. Postsurgical changes in the right posterior acetabulum are noted. IMPRESSION: Button gastrostomy catheter remains in satisfactory position. Chronic changes as described above. No acute abnormality to correspond with the given clinical history is noted. Electronically Signed   By: Alcide Clever M.D.   On: 10/10/2023 19:25    Procedures Procedures    Medications Ordered in ED Medications  oxyCODONE-acetaminophen (PERCOCET/ROXICET) 5-325 MG per tablet 1 tablet (has no administration in time range)  sodium chloride 0.9 % bolus 500 mL (0 mLs Intravenous Stopped 10/10/23 1935)  fentaNYL (SUBLIMAZE) injection 50 mcg (50 mcg Intravenous Given 10/10/23 1603)  ondansetron (ZOFRAN) injection 4 mg (4 mg Intravenous Given 10/10/23 1603)  iohexol (OMNIPAQUE) 350 MG/ML injection 75 mL (75 mLs Intravenous Contrast Given 10/10/23 1832)  fentaNYL (SUBLIMAZE) injection 50 mcg (50 mcg Intravenous Given 10/10/23 1935)    ED Course/ Medical Decision Making/ A&P                                 Medical Decision Making Amount and/or Complexity of Data Reviewed Radiology: ordered.  Risk Prescription drug management.   Pt with multiple medical problems and comorbidities and presenting today with a complaint that caries a high risk for morbidity and mortality.  Here today with persistent abdominal pain and significant leaking around the G-tube.  However patient also syncopized today seems that it was a  orthostatic event as he had gotten up quickly to go use the bathroom and felt lightheaded and weak and passed out.  Initial blood pressure was in the 70s which is now improved and responded to laying down and getting fluids.  Concern for malfunction of the PEG tube as they have been having issues with flushing it also concern for possible electrolyte abnormality or anemia as he has noticed dark stools.  Low suspicion for acute cardiac etiology such as ACS.  Low suspicion for PE.  Patient is denying any infectious symptoms at this time.  No obvious bleeding around the G-tube site.  I independently interpreted patient's labs and EKG and CMP today with a sodium of 132, normal potassium, calcium and creatinine, CBC with stable hemoglobin of 10 and normal white count, troponin is within normal limits.  EKG without acute findings today. 8:30 PM I have independently visualized and interpreted pt's images today.  CT today without signs of obstruction or abscess.  Radiology reports button gastrostomy catheter remains in satisfactory position and chronic changes but no acute findings.  This was discussed with the patient and his wife.  Suspect patient syncope today was related to orthostasis as there is no indication for acute cardiac issues and low suspicion for PE.  He is not displaying signs concerning for pneumonia at this time and patient sats have been satisfactory here.  Suspect some of patient's abdominal pain is local related to skin breakdown from constant drainage from around the tube.  This area was cleaned and a barrier cream and new dressings were placed and discussed this with the patient and his wife how to keep the area dry and use the cream.  Also patient will follow-up with IR for reevaluation of the tube.  Patient  given pain control.         Final Clinical Impression(s) / ED Diagnoses Final diagnoses:  Periumbilical abdominal pain  Gastrostomy site leak [facility])    Rx / DC Orders ED  Discharge Orders          Ordered    oxyCODONE-acetaminophen (PERCOCET/ROXICET) 5-325 MG tablet  Every 6 hours PRN        10/10/23 2029              Gwyneth Sprout, MD 10/10/23 2030

## 2023-10-10 NOTE — ED Provider Triage Note (Signed)
Emergency Medicine Provider Triage Evaluation Note  [facility] , a 82 y.o. male  was evaluated in triage.  Extensive past medical history including CAD hypertension hyperlipidemia COPD CKD and indwelling G-tube.  Reported hypotension and black diarrhea with syncopal episode today.  Review of Systems  Positive: Syncope diarrhea hypotension Negative: Chest pain shortness of breath  Physical Exam  BP (!) 108/57   Pulse 78   Temp 97.6 F (36.4 C) (Oral)   Resp 16   Ht 5\' 10"  (1.778 m)   Wt 60 kg   SpO2 99%   BMI 18.98 kg/m  Gen:   Awake, no distress Resp:  Normal effort MSK:   Moves extremities without difficulty Other:  Awake alert  Medical Decision Making  Medically screening exam initiated at 2:17 PM.  Appropriate orders placed.  [facility] was informed that the remainder of the evaluation will be completed by another provider, this initial triage assessment does not replace that evaluation, and the importance of remaining in the ED until their evaluation is complete.     [name], DO 10/10/23 1421

## 2023-10-13 ENCOUNTER — Emergency Department (HOSPITAL_COMMUNITY)
Admission: EM | Admit: 2023-10-13 | Discharge: 2023-10-13 | Disposition: A | Discharge status: Home / Self Care | Payer: Medicare HMO | Attending: Emergency Medicine | Admitting: Emergency Medicine

## 2023-10-13 ENCOUNTER — Emergency Department (HOSPITAL_COMMUNITY): Payer: Medicare HMO

## 2023-10-13 ENCOUNTER — Encounter (HOSPITAL_COMMUNITY): Payer: Self-pay

## 2023-10-13 ENCOUNTER — Other Ambulatory Visit: Payer: Self-pay

## 2023-10-13 DIAGNOSIS — T85528A Displacement of other gastrointestinal prosthetic devices, implants and grafts, initial encounter: Secondary | ICD-10-CM | POA: Insufficient documentation

## 2023-10-13 DIAGNOSIS — Y828 Other medical devices associated with adverse incidents: Secondary | ICD-10-CM | POA: Insufficient documentation

## 2023-10-13 DIAGNOSIS — Z7982 Long term (current) use of aspirin: Secondary | ICD-10-CM | POA: Insufficient documentation

## 2023-10-13 DIAGNOSIS — I2510 Atherosclerotic heart disease of native coronary artery without angina pectoris: Secondary | ICD-10-CM | POA: Insufficient documentation

## 2023-10-13 DIAGNOSIS — I10 Essential (primary) hypertension: Secondary | ICD-10-CM | POA: Diagnosis not present

## 2023-10-13 DIAGNOSIS — K9429 Other complications of gastrostomy: Secondary | ICD-10-CM | POA: Diagnosis present

## 2023-10-13 DIAGNOSIS — K9423 Gastrostomy malfunction: Secondary | ICD-10-CM | POA: Diagnosis not present

## 2023-10-13 HISTORY — PX: IR REPLC GASTRO/COLONIC TUBE PERCUT W/FLUORO: IMG2333

## 2023-10-13 MED ORDER — LIDOCAINE VISCOUS HCL 2 % MT SOLN
OROMUCOSAL | Status: AC
  Filled 2023-10-13: qty 15

## 2023-10-13 MED ORDER — IOHEXOL 300 MG/ML  SOLN
50.0000 mL | Freq: Once | INTRAMUSCULAR | Status: AC | PRN
  Administered 2023-10-13: 10 mL

## 2023-10-13 NOTE — Discharge Instructions (Addendum)
Please follow-up with your doctors if you have any concerns about the G-tube, return to the emergency department if you have any additional concerns with feedings.

## 2023-10-13 NOTE — ED Provider Notes (Signed)
[place] EMERGENCY DEPARTMENT AT [facility] Provider Note   CSN: [id number] Arrival date & time: 10/13/23  4098     History  Chief Complaint  Patient presents with   Gastrostomy Tube Out    Brian Dunn is a 82 y.o. male with past medical history significant for CAD, hyperlipidemia, hypertension, dysphagia status post PEG tube in May of this year, with replacement of PEG tube for Mickey button around 1 month ago presents concern for G-tube being displaced this morning.  Patient and family report that when they change the tube to a Mickey button they downsized and that it has had problems with leaking and not functioning properly for this entire month, notably was recently in the ED for same, had a blockage related to G-tube displacement was hospitalized.  Denies any fever, chills.  They are not sure what size tube he has.  HPI     Home Medications Prior to Admission medications   Medication Sig Start Date End Date Taking? Authorizing Provider  alfuzosin (UROXATRAL) 10 MG 24 hr tablet Take 10 mg by mouth daily with breakfast. 09/11/23  Yes [provider]  allopurinol (ZYLOPRIM) 100 MG tablet Place 100 mg into feeding tube daily. 09/13/13  Yes [provider]  amLODipine (NORVASC) 5 MG tablet Place 1 tablet (5 mg total) into feeding tube daily as directed. 10/04/23  Yes Elgergawy, Leana Roe, MD  aspirin 81 MG chewable tablet Place 81 mg into feeding tube daily.   Yes [provider]  budesonide-formoterol (SYMBICORT) 160-4.5 MCG/ACT inhaler Inhale 2 puffs into the lungs daily.   Yes [provider]  celecoxib (CELEBREX) 200 MG capsule Place 200 mg into feeding tube daily.   Yes [provider]  colchicine 0.6 MG tablet Take 0.5 tablets (0.3 mg total) by mouth daily. 10/04/23  Yes Elgergawy, Leana Roe, MD  Cyanocobalamin (VITAMIN B12 PO) Give 5 mg by tube daily.   Yes [provider]  dicyclomine (BENTYL) 10 MG/5ML  solution Place 20 mg into feeding tube See admin instructions. Give 10mL via G-tube 3 times a day as needed for abdominal cramping. 06/13/23  Yes [provider]  docusate (COLACE) 50 MG/5ML liquid Place 10 mLs (100 mg total) into feeding tube daily. 03/25/23  Yes Rhyne, Ames Coupe, PA-C  famotidine (PEPCID) 40 MG tablet Place 1 tablet (40 mg total) into feeding tube daily. 03/25/23  Yes Rhyne, Ames Coupe, PA-C  Ferrous Sulfate (IRON) 325 (65 Fe) MG TABS 65 mg by Feeding Tube route daily.   Yes [provider]  finasteride (PROSCAR) 5 MG tablet 5 mg. Place 1 tablet into the feeding tube daily 08/19/23  Yes [provider]  fluticasone (FLONASE) 50 MCG/ACT nasal spray Place 2 sprays into the nose daily. 12/01/10  Yes [provider]  gentamicin cream (GARAMYCIN) 0.1 % Apply 1 Application topically 3 (three) times daily.   Yes [provider]  loratadine (CLARITIN) 10 MG tablet Place 1 tablet (10 mg total) into feeding tube daily. 03/24/23  Yes Rhyne, Ames Coupe, PA-C  MAGNESIUM PO 30 mLs by Feeding Tube route daily.   Yes [provider]  mirtazapine (REMERON) 15 MG tablet Place 15 mg into feeding tube at bedtime.   Yes [provider]  Multiple Vitamin (MULTIVITAMIN) capsule Place 1 capsule into feeding tube daily. 03/24/23  Yes Rhyne, Ames Coupe, PA-C  nitroGLYCERIN (NITROSTAT) 0.4 MG SL tablet Place 1 tablet (0.4 mg total) under the tongue every 5 (five) minutes  as needed for chest pain. 01/29/22  Yes Revankar, Aundra Dubin, MD  Nutritional Supplements (FEEDING SUPPLEMENT, OSMOLITE 1.5 CAL,) LIQD Place 237 mLs into feeding tube 5 (five) times daily.   Yes [provider]  oxyCODONE-acetaminophen (PERCOCET/ROXICET) 5-325 MG tablet Take 1 tablet by mouth every 6 (six) hours as needed for severe pain (pain score 7-10). 10/10/23  Yes Gwyneth Sprout, MD  pravastatin (PRAVACHOL) 40 MG tablet Take 1 tablet (40 mg total) by mouth daily. Patient  taking differently: Place 40 mg into feeding tube daily. 04/01/23  Yes Revankar, Aundra Dubin, MD  primidone (MYSOLINE) 50 MG tablet Place 1 tablet (50 mg total) into feeding tube at bedtime. 03/24/23  Yes Rhyne, Samantha J, PA-C  senna-docusate (SENOKOT-S) 8.6-50 MG tablet Place 1 tablet into feeding tube at bedtime as needed for mild constipation. 03/24/23  Yes Rhyne, Ames Coupe, PA-C  traMADol (ULTRAM) 50 MG tablet Place 1 tablet (50 mg total) into feeding tube every 6 (six) hours as needed for moderate pain. 03/24/23  Yes Rhyne, Samantha J, PA-C      Allergies    Metoprolol and Morphine    Review of Systems   Review of Systems  All other systems reviewed and are negative.   Physical Exam Updated Vital Signs BP (!) 142/97   Pulse 75   Temp 97.7 F (36.5 C)   Resp 15   Ht 5\' 10"  (1.778 m)   Wt 60 kg   SpO2 100%   BMI 18.98 kg/m  Physical Exam Vitals and nursing note reviewed.  Constitutional:      General: He is not in acute distress.    Appearance: Normal appearance.  HENT:     Head: Normocephalic and atraumatic.  Eyes:     General:        Right eye: No discharge.        Left eye: No discharge.  Cardiovascular:     Rate and Rhythm: Normal rate and regular rhythm.     Heart sounds: No murmur heard.    No friction rub. No gallop.  Pulmonary:     Effort: Pulmonary effort is normal.     Breath sounds: Normal breath sounds.  Abdominal:     General: Bowel sounds are normal.     Palpations: Abdomen is soft.     Comments: Skin breakdown and open G-tube port site in the mid abdomen.  No purulent drainage, or fluctuance noted.  Skin:    General: Skin is warm and dry.     Capillary Refill: Capillary refill takes less than 2 seconds.  Neurological:     Mental Status: He is alert and oriented to person, place, and time.  Psychiatric:        Mood and Affect: Mood normal.        Behavior: Behavior normal.     ED Results / Procedures / Treatments   Labs (all labs ordered are  listed, but only abnormal results are displayed) Labs Reviewed - No data to display  EKG None  Radiology IR REPLACE GASTRO/JEJUNO TUBE PERC W/FLUORO  Result Date: 10/13/2023 INDICATION: 82 year old male with displaced Mickey button presents to the emergency department. The also has complaint of leakage at the gastrostomy site. EXAM: IMAGE GUIDED PLACEMENT OF GASTROSTOMY TUBE MEDICATIONS: None ANESTHESIA/SEDATION: None CONTRAST:  10 cc-administered into the gastric lumen. FLUOROSCOPY: Radiation Exposure Index (as provided by the fluoroscopic device): 0.5 mGy Kerma COMPLICATIONS: None PROCEDURE: Informed written consent was obtained from the patient after a thorough discussion of  the procedural risks, benefits and alternatives. All questions were addressed. Maximal Sterile Barrier Technique was utilized including caps, mask, sterile gowns, sterile gloves, sterile drape, hand hygiene and skin antiseptic. A timeout was performed prior to the initiation of the procedure. Patient was positioned supine it under the image intensifier. Patient is prepped and draped in the usual sterile fashion. The interventional radiology staff was able to replace the displaced gastrostomy with a new 26 French balloon retention standard gastrostomy. The balloon was inflated with a proximally 10 cc of saline and was pulled tight to the gastropexy site. The flange was tightened. Contrast was injected and a final image was stored. Patient tolerated the procedure well and remained hemodynamically stable throughout. No complications were encountered and no significant blood loss. IMPRESSION: Image guided placement of a displaced gastrostomy tube with a new 26 French balloon retention. Electronically Signed   By: Gilmer Mor D.O.   On: 10/13/2023 14:06    Procedures Procedures    Medications Ordered in ED Medications  iohexol (OMNIPAQUE) 300 MG/ML solution 50 mL (10 mLs Per Tube Contrast Given 10/13/23 1400)    ED Course/  Medical Decision Making/ A&P                                 Medical Decision Making  82 year old male with history of dysphagia, G-tube who arrives to the ED with displaced Mickey button PEG tube.  Tube removal was accidental, and has been out just since this morning.  They report that there were a lot of issues with the Mickey button and that they have talked to the IR team about going back to a regular G-tube.  We attempted to replace the G-tube in the emergency department but patient and spouse were feeling as though the G-tubes that we have in the department were not adequate/have not worked in the past for the patient and so I contacted IR, spoke with Dr. Loreta Ave and the IR team replace the G-tube per their separately dictated note, they ordered for a 24 French G-tube to be sent to be ordered for permanent replacement.  I independently interpreted plain film radiograph confirming G-tube placement which shows appropriately functioning feeding tube, patient feeling good after the procedure and stable for discharge with close IR follow-up.  Patient understands and agrees to the plan. Final Clinical Impression(s) / ED Diagnoses Final diagnoses:  Dislodged gastrostomy tube    Rx / DC Orders ED Discharge Orders     None         Olene Floss, PA-C 10/13/23 1427    Lonell Grandchild, MD 10/14/23 [phone]

## 2023-10-13 NOTE — Procedures (Addendum)
Interventional Radiology Procedure Note  Procedure:   Fluoro replacement of gastrostomy.  72F balloon retention.   Complications: None Recommendations:  - Ok to use - Routine exchanges - Routine gtube care   Signed,  Yvone Neu. Loreta Ave, DO

## 2023-10-13 NOTE — ED Triage Notes (Signed)
Pt to ED via pov from home. Pt's G tube came out this morning. Pt  c/o pain at site. Pt's family states tube was not working well prior to coming out anyway.

## 2023-10-14 DIAGNOSIS — I129 Hypertensive chronic kidney disease with stage 1 through stage 4 chronic kidney disease, or unspecified chronic kidney disease: Secondary | ICD-10-CM | POA: Diagnosis not present

## 2023-10-14 DIAGNOSIS — K219 Gastro-esophageal reflux disease without esophagitis: Secondary | ICD-10-CM | POA: Diagnosis not present

## 2023-10-14 DIAGNOSIS — D631 Anemia in chronic kidney disease: Secondary | ICD-10-CM | POA: Diagnosis not present

## 2023-10-14 DIAGNOSIS — M109 Gout, unspecified: Secondary | ICD-10-CM | POA: Diagnosis not present

## 2023-10-14 DIAGNOSIS — Z8701 Personal history of pneumonia (recurrent): Secondary | ICD-10-CM | POA: Diagnosis not present

## 2023-10-14 DIAGNOSIS — E43 Unspecified severe protein-calorie malnutrition: Secondary | ICD-10-CM | POA: Diagnosis not present

## 2023-10-14 DIAGNOSIS — H903 Sensorineural hearing loss, bilateral: Secondary | ICD-10-CM | POA: Diagnosis not present

## 2023-10-14 DIAGNOSIS — Z7982 Long term (current) use of aspirin: Secondary | ICD-10-CM | POA: Diagnosis not present

## 2023-10-14 DIAGNOSIS — Z431 Encounter for attention to gastrostomy: Secondary | ICD-10-CM | POA: Diagnosis not present

## 2023-10-14 DIAGNOSIS — E785 Hyperlipidemia, unspecified: Secondary | ICD-10-CM | POA: Diagnosis not present

## 2023-10-14 DIAGNOSIS — J4489 Other specified chronic obstructive pulmonary disease: Secondary | ICD-10-CM | POA: Diagnosis not present

## 2023-10-14 DIAGNOSIS — Z79899 Other long term (current) drug therapy: Secondary | ICD-10-CM | POA: Diagnosis not present

## 2023-10-14 DIAGNOSIS — M8580 Other specified disorders of bone density and structure, unspecified site: Secondary | ICD-10-CM | POA: Diagnosis not present

## 2023-10-14 DIAGNOSIS — N189 Chronic kidney disease, unspecified: Secondary | ICD-10-CM | POA: Diagnosis not present

## 2023-10-14 DIAGNOSIS — Q2549 Other congenital malformations of aorta: Secondary | ICD-10-CM | POA: Diagnosis not present

## 2023-10-14 DIAGNOSIS — K5900 Constipation, unspecified: Secondary | ICD-10-CM | POA: Diagnosis not present

## 2023-10-14 DIAGNOSIS — Z955 Presence of coronary angioplasty implant and graft: Secondary | ICD-10-CM | POA: Diagnosis not present

## 2023-10-14 DIAGNOSIS — I2510 Atherosclerotic heart disease of native coronary artery without angina pectoris: Secondary | ICD-10-CM | POA: Diagnosis not present

## 2023-10-14 DIAGNOSIS — R911 Solitary pulmonary nodule: Secondary | ICD-10-CM | POA: Diagnosis not present

## 2023-10-14 DIAGNOSIS — G250 Essential tremor: Secondary | ICD-10-CM | POA: Diagnosis not present

## 2023-10-14 DIAGNOSIS — Z7951 Long term (current) use of inhaled steroids: Secondary | ICD-10-CM | POA: Diagnosis not present

## 2023-10-14 DIAGNOSIS — R1312 Dysphagia, oropharyngeal phase: Secondary | ICD-10-CM | POA: Diagnosis not present

## 2023-10-15 DIAGNOSIS — K219 Gastro-esophageal reflux disease without esophagitis: Secondary | ICD-10-CM | POA: Diagnosis not present

## 2023-10-15 DIAGNOSIS — N189 Chronic kidney disease, unspecified: Secondary | ICD-10-CM | POA: Diagnosis not present

## 2023-10-15 DIAGNOSIS — Z8701 Personal history of pneumonia (recurrent): Secondary | ICD-10-CM | POA: Diagnosis not present

## 2023-10-15 DIAGNOSIS — R1312 Dysphagia, oropharyngeal phase: Secondary | ICD-10-CM | POA: Diagnosis not present

## 2023-10-15 DIAGNOSIS — I129 Hypertensive chronic kidney disease with stage 1 through stage 4 chronic kidney disease, or unspecified chronic kidney disease: Secondary | ICD-10-CM | POA: Diagnosis not present

## 2023-10-15 DIAGNOSIS — R911 Solitary pulmonary nodule: Secondary | ICD-10-CM | POA: Diagnosis not present

## 2023-10-15 DIAGNOSIS — Z7982 Long term (current) use of aspirin: Secondary | ICD-10-CM | POA: Diagnosis not present

## 2023-10-15 DIAGNOSIS — E785 Hyperlipidemia, unspecified: Secondary | ICD-10-CM | POA: Diagnosis not present

## 2023-10-15 DIAGNOSIS — E43 Unspecified severe protein-calorie malnutrition: Secondary | ICD-10-CM | POA: Diagnosis not present

## 2023-10-15 DIAGNOSIS — Z79899 Other long term (current) drug therapy: Secondary | ICD-10-CM | POA: Diagnosis not present

## 2023-10-15 DIAGNOSIS — Z7951 Long term (current) use of inhaled steroids: Secondary | ICD-10-CM | POA: Diagnosis not present

## 2023-10-15 DIAGNOSIS — Z955 Presence of coronary angioplasty implant and graft: Secondary | ICD-10-CM | POA: Diagnosis not present

## 2023-10-15 DIAGNOSIS — I2510 Atherosclerotic heart disease of native coronary artery without angina pectoris: Secondary | ICD-10-CM | POA: Diagnosis not present

## 2023-10-15 DIAGNOSIS — H903 Sensorineural hearing loss, bilateral: Secondary | ICD-10-CM | POA: Diagnosis not present

## 2023-10-15 DIAGNOSIS — M8580 Other specified disorders of bone density and structure, unspecified site: Secondary | ICD-10-CM | POA: Diagnosis not present

## 2023-10-15 DIAGNOSIS — Z431 Encounter for attention to gastrostomy: Secondary | ICD-10-CM | POA: Diagnosis not present

## 2023-10-15 DIAGNOSIS — G250 Essential tremor: Secondary | ICD-10-CM | POA: Diagnosis not present

## 2023-10-15 DIAGNOSIS — D631 Anemia in chronic kidney disease: Secondary | ICD-10-CM | POA: Diagnosis not present

## 2023-10-15 DIAGNOSIS — J4489 Other specified chronic obstructive pulmonary disease: Secondary | ICD-10-CM | POA: Diagnosis not present

## 2023-10-15 DIAGNOSIS — K5900 Constipation, unspecified: Secondary | ICD-10-CM | POA: Diagnosis not present

## 2023-10-15 DIAGNOSIS — M109 Gout, unspecified: Secondary | ICD-10-CM | POA: Diagnosis not present

## 2023-10-15 DIAGNOSIS — Q2549 Other congenital malformations of aorta: Secondary | ICD-10-CM | POA: Diagnosis not present

## 2023-10-17 DIAGNOSIS — Z7951 Long term (current) use of inhaled steroids: Secondary | ICD-10-CM | POA: Diagnosis not present

## 2023-10-17 DIAGNOSIS — K5900 Constipation, unspecified: Secondary | ICD-10-CM | POA: Diagnosis not present

## 2023-10-17 DIAGNOSIS — Z955 Presence of coronary angioplasty implant and graft: Secondary | ICD-10-CM | POA: Diagnosis not present

## 2023-10-17 DIAGNOSIS — Z431 Encounter for attention to gastrostomy: Secondary | ICD-10-CM | POA: Diagnosis not present

## 2023-10-17 DIAGNOSIS — E43 Unspecified severe protein-calorie malnutrition: Secondary | ICD-10-CM | POA: Diagnosis not present

## 2023-10-17 DIAGNOSIS — R1312 Dysphagia, oropharyngeal phase: Secondary | ICD-10-CM | POA: Diagnosis not present

## 2023-10-17 DIAGNOSIS — M109 Gout, unspecified: Secondary | ICD-10-CM | POA: Diagnosis not present

## 2023-10-17 DIAGNOSIS — M8580 Other specified disorders of bone density and structure, unspecified site: Secondary | ICD-10-CM | POA: Diagnosis not present

## 2023-10-17 DIAGNOSIS — G250 Essential tremor: Secondary | ICD-10-CM | POA: Diagnosis not present

## 2023-10-17 DIAGNOSIS — Q2549 Other congenital malformations of aorta: Secondary | ICD-10-CM | POA: Diagnosis not present

## 2023-10-17 DIAGNOSIS — R911 Solitary pulmonary nodule: Secondary | ICD-10-CM | POA: Diagnosis not present

## 2023-10-17 DIAGNOSIS — H903 Sensorineural hearing loss, bilateral: Secondary | ICD-10-CM | POA: Diagnosis not present

## 2023-10-17 DIAGNOSIS — I129 Hypertensive chronic kidney disease with stage 1 through stage 4 chronic kidney disease, or unspecified chronic kidney disease: Secondary | ICD-10-CM | POA: Diagnosis not present

## 2023-10-17 DIAGNOSIS — K219 Gastro-esophageal reflux disease without esophagitis: Secondary | ICD-10-CM | POA: Diagnosis not present

## 2023-10-17 DIAGNOSIS — Z7982 Long term (current) use of aspirin: Secondary | ICD-10-CM | POA: Diagnosis not present

## 2023-10-17 DIAGNOSIS — D631 Anemia in chronic kidney disease: Secondary | ICD-10-CM | POA: Diagnosis not present

## 2023-10-17 DIAGNOSIS — E785 Hyperlipidemia, unspecified: Secondary | ICD-10-CM | POA: Diagnosis not present

## 2023-10-17 DIAGNOSIS — Z79899 Other long term (current) drug therapy: Secondary | ICD-10-CM | POA: Diagnosis not present

## 2023-10-17 DIAGNOSIS — J4489 Other specified chronic obstructive pulmonary disease: Secondary | ICD-10-CM | POA: Diagnosis not present

## 2023-10-17 DIAGNOSIS — N189 Chronic kidney disease, unspecified: Secondary | ICD-10-CM | POA: Diagnosis not present

## 2023-10-17 DIAGNOSIS — I2510 Atherosclerotic heart disease of native coronary artery without angina pectoris: Secondary | ICD-10-CM | POA: Diagnosis not present

## 2023-10-17 DIAGNOSIS — Z8701 Personal history of pneumonia (recurrent): Secondary | ICD-10-CM | POA: Diagnosis not present

## 2023-10-20 DIAGNOSIS — I959 Hypotension, unspecified: Secondary | ICD-10-CM | POA: Diagnosis not present

## 2023-10-20 DIAGNOSIS — I2510 Atherosclerotic heart disease of native coronary artery without angina pectoris: Secondary | ICD-10-CM | POA: Diagnosis not present

## 2023-10-20 DIAGNOSIS — K219 Gastro-esophageal reflux disease without esophagitis: Secondary | ICD-10-CM | POA: Diagnosis not present

## 2023-10-20 DIAGNOSIS — Q2549 Other congenital malformations of aorta: Secondary | ICD-10-CM | POA: Diagnosis not present

## 2023-10-20 DIAGNOSIS — Z8701 Personal history of pneumonia (recurrent): Secondary | ICD-10-CM | POA: Diagnosis not present

## 2023-10-20 DIAGNOSIS — E43 Unspecified severe protein-calorie malnutrition: Secondary | ICD-10-CM | POA: Diagnosis not present

## 2023-10-20 DIAGNOSIS — M8580 Other specified disorders of bone density and structure, unspecified site: Secondary | ICD-10-CM | POA: Diagnosis not present

## 2023-10-20 DIAGNOSIS — Z955 Presence of coronary angioplasty implant and graft: Secondary | ICD-10-CM | POA: Diagnosis not present

## 2023-10-20 DIAGNOSIS — N189 Chronic kidney disease, unspecified: Secondary | ICD-10-CM | POA: Diagnosis not present

## 2023-10-20 DIAGNOSIS — J4489 Other specified chronic obstructive pulmonary disease: Secondary | ICD-10-CM | POA: Diagnosis not present

## 2023-10-20 DIAGNOSIS — D631 Anemia in chronic kidney disease: Secondary | ICD-10-CM | POA: Diagnosis not present

## 2023-10-20 DIAGNOSIS — Z79899 Other long term (current) drug therapy: Secondary | ICD-10-CM | POA: Diagnosis not present

## 2023-10-20 DIAGNOSIS — E785 Hyperlipidemia, unspecified: Secondary | ICD-10-CM | POA: Diagnosis not present

## 2023-10-20 DIAGNOSIS — K9423 Gastrostomy malfunction: Secondary | ICD-10-CM | POA: Diagnosis not present

## 2023-10-20 DIAGNOSIS — Z7951 Long term (current) use of inhaled steroids: Secondary | ICD-10-CM | POA: Diagnosis not present

## 2023-10-20 DIAGNOSIS — K5900 Constipation, unspecified: Secondary | ICD-10-CM | POA: Diagnosis not present

## 2023-10-20 DIAGNOSIS — Z7982 Long term (current) use of aspirin: Secondary | ICD-10-CM | POA: Diagnosis not present

## 2023-10-20 DIAGNOSIS — M109 Gout, unspecified: Secondary | ICD-10-CM | POA: Diagnosis not present

## 2023-10-20 DIAGNOSIS — H903 Sensorineural hearing loss, bilateral: Secondary | ICD-10-CM | POA: Diagnosis not present

## 2023-10-20 DIAGNOSIS — G250 Essential tremor: Secondary | ICD-10-CM | POA: Diagnosis not present

## 2023-10-20 DIAGNOSIS — R1312 Dysphagia, oropharyngeal phase: Secondary | ICD-10-CM | POA: Diagnosis not present

## 2023-10-20 DIAGNOSIS — I129 Hypertensive chronic kidney disease with stage 1 through stage 4 chronic kidney disease, or unspecified chronic kidney disease: Secondary | ICD-10-CM | POA: Diagnosis not present

## 2023-10-20 DIAGNOSIS — Z431 Encounter for attention to gastrostomy: Secondary | ICD-10-CM | POA: Diagnosis not present

## 2023-10-20 DIAGNOSIS — R911 Solitary pulmonary nodule: Secondary | ICD-10-CM | POA: Diagnosis not present

## 2023-10-20 DIAGNOSIS — J189 Pneumonia, unspecified organism: Secondary | ICD-10-CM | POA: Diagnosis not present

## 2023-10-22 NOTE — Progress Notes (Unsigned)
[facility]  [address] [place],  [place]  [address] [phone]  Clinic Day:  10/22/2023  Referring physician: Physicians, Cheryln Manly F*   HISTORY OF PRESENT ILLNESS:  The patient is a 82 y.o. male with anemia secondary to mild renal insufficiency.  Over these past few months, he ha been receiving monthly Retacrit again to get his hemoglobin back to/above 10.   PHYSICAL EXAM:  There were no vitals taken for this visit. Wt Readings from Last 3 Encounters:  10/13/23 132 lb 4.4 oz (60 kg)  10/10/23 132 lb 4.4 oz (60 kg)  10/03/23 132 lb 4.4 oz (60 kg)   There is no height or weight on file to calculate BMI. Performance status (ECOG): {CHL ONC [id number] Physical Exam  LABS:      Latest Ref Rng & Units 10/10/2023    2:23 PM 10/04/2023    3:49 AM 10/02/2023   11:00 AM  CBC  WBC 4.0 - 10.5 K/uL 7.0  7.8  10.2   Hemoglobin 13.0 - 17.0 g/dL 46.9  9.1  62.9   Hematocrit 39.0 - 52.0 % 32.5  28.6  32.0   Platelets 150 - 400 K/uL 264  146  155       Latest Ref Rng & Units 10/10/2023    2:23 PM 10/04/2023    3:49 AM 10/03/2023    4:18 AM  CMP  Glucose 70 - 99 mg/dL 86  528  413   BUN 8 - 23 mg/dL 41  30  29   Creatinine 0.61 - 1.24 mg/dL 2.44  0.10  2.72   Sodium 135 - 145 mmol/L 132  139  142   Potassium 3.5 - 5.1 mmol/L 4.8  3.4  3.7   Chloride 98 - 111 mmol/L 97  108  108   CO2 22 - 32 mmol/L 25  27  27    Calcium 8.9 - 10.3 mg/dL 9.2  8.2  8.6   Total Protein 6.5 - 8.1 g/dL 6.9     Total Bilirubin <1.2 mg/dL 0.4     Alkaline Phos 38 - 126 U/L 56     AST 15 - 41 U/L 38     ALT 0 - 44 U/L 41        No results found for: "CEA1", "CEA" / No results found for: "CEA1", "CEA" No results found for: "PSA1" No results found for: "ZDG644" No results found for: "CAN125"  No results found for: "TOTALPROTELP", "ALBUMINELP", "A1GS", "A2GS", "BETS", "BETA2SER", "GAMS", "MSPIKE", "SPEI" Lab Results  Component Value Date   TIBC 304  07/24/2023   TIBC 174 (L) 03/15/2023   TIBC 288 12/20/2022   FERRITIN 201 07/24/2023   FERRITIN 519 (H) 03/15/2023   FERRITIN 277 12/20/2022   IRONPCTSAT 17 (L) 07/24/2023   IRONPCTSAT 8 (L) 03/15/2023   IRONPCTSAT 16 (L) 12/20/2022   No results found for: "LDH"  No results found for: "AFPTUMOR", "TOTALPROTELP", "ALBUMINELP", "A1GS", "A2GS", "BETS", "BETA2SER", "GAMS", "MSPIKE", "SPEI", "LDH", "CEA1", "CEA", "PSA1", "IGASERUM", "IGGSERUM", "IGMSERUM", "THGAB", "THYROGLB"  Review Flowsheet  More data exists      Latest Ref Rng & Units 12/20/2022 03/15/2023 07/24/2023  Oncology Labs  Ferritin 24 - 336 ng/mL 277  519  201   %SAT 17.9 - 39.5 % 16  8  17       STUDIES:  IR Replc Gastro/Colonic Tube Percut W/Fluoro Result Date: 10/13/2023 INDICATION: 82 year old male with displaced Mickey button presents to the emergency department. The also  has complaint of leakage at the gastrostomy site. EXAM: IMAGE GUIDED PLACEMENT OF GASTROSTOMY TUBE MEDICATIONS: None ANESTHESIA/SEDATION: None CONTRAST:  10 cc-administered into the gastric lumen. FLUOROSCOPY: Radiation Exposure Index (as provided by the fluoroscopic device): 0.5 mGy Kerma COMPLICATIONS: None PROCEDURE: Informed written consent was obtained from the patient after a thorough discussion of the procedural risks, benefits and alternatives. All questions were addressed. Maximal Sterile Barrier Technique was utilized including caps, mask, sterile gowns, sterile gloves, sterile drape, hand hygiene and skin antiseptic. A timeout was performed prior to the initiation of the procedure. Patient was positioned supine it under the image intensifier. Patient is prepped and draped in the usual sterile fashion. The interventional radiology staff was able to replace the displaced gastrostomy with a new 26 French balloon retention standard gastrostomy. The balloon was inflated with a proximally 10 cc of saline and was pulled tight to the gastropexy site. The  flange was tightened. Contrast was injected and a final image was stored. Patient tolerated the procedure well and remained hemodynamically stable throughout. No complications were encountered and no significant blood loss. IMPRESSION: Image guided placement of a displaced gastrostomy tube with a new 26 French balloon retention. Electronically Signed   By: Gilmer Mor D.O.   On: 10/13/2023 14:06   CT ABDOMEN PELVIS W CONTRAST Result Date: 10/10/2023 CLINICAL DATA:  History of recent gastrostomy button replacement with pain, initial encounter EXAM: CT ABDOMEN AND PELVIS WITH CONTRAST TECHNIQUE: Multidetector CT imaging of the abdomen and pelvis was performed using the standard protocol following bolus administration of intravenous contrast. RADIATION DOSE REDUCTION: This exam was performed according to the departmental dose-optimization program which includes automated exposure control, adjustment of the mA and/or kV according to patient size and/or use of iterative reconstruction technique. CONTRAST:  75mL OMNIPAQUE IOHEXOL 350 MG/ML SOLN COMPARISON:  09/30/2023 FINDINGS: Lower chest: Mild scarring is noted in the right lower lobe stable in appearance from the prior exam. No new focal abnormality is noted. Hepatobiliary: No focal liver abnormality is seen. No gallstones, gallbladder wall thickening, or biliary dilatation. Pancreas: Unremarkable. No pancreatic ductal dilatation or surrounding inflammatory changes. Spleen: Normal in size without focal abnormality. Adrenals/Urinary Tract: Adrenal glands are within normal limits. Kidneys demonstrate a normal enhancement pattern bilaterally. No obstructive changes are seen. The bladder is partially distended. Delayed images demonstrate normal excretion bilaterally. Stomach/Bowel: Stomach is decompressed. A short gastric Mickey button is noted in place within the gastric lumen. Air in fluid is noted throughout the colon. No obstructive or inflammatory changes are  seen. The appendix is within normal limits. Scattered sigmoid diverticulosis is noted without evidence of diverticulitis. No small bowel abnormality is noted. Vascular/Lymphatic: Aortic atherosclerosis. No enlarged abdominal or pelvic lymph nodes. Left iliac arterial stent is again noted. Reproductive: Prostate is unremarkable. Brachytherapy seeds are again noted within the prostate. Other: No abdominal wall hernia or abnormality. No abdominopelvic ascites. Musculoskeletal: Chronic L1 compression deformity is noted. Diffuse multilevel degenerative changes of the thoracolumbar spine are seen. Postsurgical changes in the right posterior acetabulum are noted. IMPRESSION: Button gastrostomy catheter remains in satisfactory position. Chronic changes as described above. No acute abnormality to correspond with the given clinical history is noted. Electronically Signed   By: Alcide Clever M.D.   On: 10/10/2023 19:25   IR Replc Gastro/Colonic Tube Percut W/Fluoro Result Date: 10/01/2023 INDICATION: 82 year old male presents for exchange of indwelling gastrostomy for his personalized low-profile 24 [place], 2.5 stomal length Mickey button gastrostomy EXAM: GASTROSTOMY CATHETER REPLACEMENT MEDICATIONS: None ANESTHESIA/SEDATION: None  CONTRAST:  5mL OMNIPAQUE IOHEXOL 300 MG/ML SOLN - administered into the gastric lumen. FLUOROSCOPY: Radiation Exposure Index (as provided by the fluoroscopic device): 1.2 mGy Kerma COMPLICATIONS: None PROCEDURE: Informed written consent was obtained from the patient and the patient's family after a thorough discussion of the procedural risks, benefits and alternatives. All questions were addressed. Clean technique was utilized including caps, mask, sterile gowns, sterile gloves, sterile drape, hand hygiene and skin antiseptic. A timeout was performed prior to the initiation of the procedure. The indwelling percutaneous gastrostomy was removed after deflating the balloon. The new 24 French 2.5  stomal length Mickey button low-profile gastrostomy was placed through the stoma. Balloon was inflated. Contrast was injected confirming location. Final image was stored. Patient tolerated the procedure well and remained hemodynamically stable throughout. No complications were encountered and no significant blood loss encountered. IMPRESSION: Image guided exchange of gastrostomy, with placement of the patient's personalized low-profile Mickey button 24 French 2.5 cm stomal length gastrostomy. Electronically Signed   By: Gilmer Mor D.O.   On: 10/01/2023 13:52   DG Abd Portable 1V Result Date: 10/01/2023 CLINICAL DATA:  G-tube repositioning post CT EXAM: PORTABLE ABDOMEN - 1 VIEW COMPARISON:  CT abdomen and pelvis 09/30/2023 FINDINGS: Enteric contrast injected through the gastrostomy tube is present within the first and second portions of the duodenum. No contrast is visualized within the stomach. The balloon catheter is not outlined by contrast. No evidence of bowel obstruction. Contrast from prior studies within the bladder and rectum. Left common iliac artery stent. Screw in the posterior right acetabulum. IMPRESSION: Enteric contrast injected through the G-tube is within the first and second portions of the duodenum. No enteric contrast within the stomach. Electronically Signed   By: Minerva Fester M.D.   On: 10/01/2023 03:12   DG Chest Port 1 View Result Date: 10/01/2023 CLINICAL DATA:  Aspiration EXAM: PORTABLE CHEST 1 VIEW COMPARISON:  CT 09/30/2023, 03/26/2023 FINDINGS: Coronary stent. Stent graft at the aortic arch. Right infrahilar bandlike opacity, corresponding to CT demonstrated right lower lobe atelectasis stable cardiomediastinal silhouette. No pneumothorax. Old rib fractures IMPRESSION: Right infrahilar bandlike opacity, corresponding to CT demonstrated right lower lobe atelectasis. Electronically Signed   By: Jasmine Pang M.D.   On: 10/01/2023 01:38   CT ABDOMEN PELVIS W  CONTRAST Result Date: 10/01/2023 CLINICAL DATA:  Acute nonlocalized abdominal pain EXAM: CT ABDOMEN AND PELVIS WITH CONTRAST TECHNIQUE: Multidetector CT imaging of the abdomen and pelvis was performed using the standard protocol following bolus administration of intravenous contrast. RADIATION DOSE REDUCTION: This exam was performed according to the departmental dose-optimization program which includes automated exposure control, adjustment of the mA and/or kV according to patient size and/or use of iterative reconstruction technique. CONTRAST:  75mL OMNIPAQUE IOHEXOL 350 MG/ML SOLN COMPARISON:  09/29/2023 FINDINGS: Lower chest: There has developed extensive airway impaction within the right middle and lower lobes with discoid atelectasis within the right lower lobe. No focal consolidation. No pleural effusion. Extensive multi-vessel coronary artery calcification. Hepatobiliary: No focal liver abnormality is seen. No gallstones, gallbladder wall thickening, or biliary dilatation. Pancreas: Unremarkable Spleen: Unremarkable Adrenals/Urinary Tract: Adrenal glands are unremarkable. Kidneys are normal, without renal calculi, focal lesion, or hydronephrosis. Bladder is unremarkable. Stomach/Bowel: Balloon retention gastrostomy catheter is again seen with its tip within the post pyloric duodenal bulb. The stomach is moderately fluid distended suggesting at least some degree of gastric outlet obstruction. Severe sigmoid diverticulosis. Stomach, small bowel, large bowel are otherwise unremarkable. Appendix normal. No free intraperitoneal gas or  fluid. Vascular/Lymphatic: Extensive aortoiliac atherosclerotic calcification. Vascular stent seen within the left common and external iliac arteries with wide patency of the stented segment. No pathologic adenopathy within the abdomen and pelvis. Reproductive: Brachytherapy seeds are seen within the prostate gland Other: No abdominal wall hernia Musculoskeletal: Osseous structures  are diffusely osteopenic. Remote L1 compression deformity again noted. Advanced degenerative changes are seen throughout the lumbar spine. Right posterior acetabular wall ORIF has been performed. No acute bone abnormality. No lytic or blastic bone lesion. IMPRESSION: 1. Interval development of extensive airway impaction within the right middle and lower lobes with discoid atelectasis within the right lower lobe. Given findings within the stomach, this may relate to aspiration. 2. Extensive multi-vessel coronary artery calcification. 3. Balloon retention gastrostomy catheter with its tip within the post pyloric duodenal bulb. The stomach is moderately fluid distended suggesting at least some degree of gastric outlet obstruction. Repositioning is recommended. 4. Severe sigmoid diverticulosis without superimposed acute inflammatory change. Aortic Atherosclerosis (ICD10-I70.0). Electronically Signed   By: Helyn Numbers M.D.   On: 10/01/2023 00:18   DG Chest 2 View Result Date: 09/30/2023 CLINICAL DATA:  Dyspnea EXAM: CHEST - 2 VIEW COMPARISON:  06/26/2023 FINDINGS: Discoid atelectasis right base. Lungs are otherwise clear. No pneumothorax or pleural effusion. Cardiac size within limits. The stent graft noted within the aortic arch unchanged. Healed bilateral rib fractures noted. No acute bone. IMPRESSION: 1. Right basilar discoid atelectasis. Electronically Signed   By: Helyn Numbers M.D.   On: 09/30/2023 21:48      ASSESSMENT & PLAN:   Assessment/Plan:  A 82 y.o. male with *** .The patient understands all the plans discussed today and is in agreement with them.      Mykeisha Dysert Kirby Funk, MD

## 2023-10-23 ENCOUNTER — Other Ambulatory Visit: Payer: Self-pay | Admitting: Oncology

## 2023-10-23 ENCOUNTER — Telehealth: Payer: Self-pay

## 2023-10-23 ENCOUNTER — Inpatient Hospital Stay: Payer: Medicare HMO

## 2023-10-23 ENCOUNTER — Inpatient Hospital Stay: Payer: Medicare HMO | Attending: Oncology | Admitting: Oncology

## 2023-10-23 VITALS — BP 115/61 | HR 71 | Temp 97.8°F | Resp 16 | Ht 70.0 in | Wt 132.4 lb

## 2023-10-23 DIAGNOSIS — D631 Anemia in chronic kidney disease: Secondary | ICD-10-CM

## 2023-10-23 DIAGNOSIS — N189 Chronic kidney disease, unspecified: Secondary | ICD-10-CM

## 2023-10-23 DIAGNOSIS — N182 Chronic kidney disease, stage 2 (mild): Secondary | ICD-10-CM | POA: Diagnosis not present

## 2023-10-23 LAB — CMP (CANCER CENTER ONLY)
ALT: 27 U/L (ref 0–44)
AST: 29 U/L (ref 15–41)
Albumin: 4.1 g/dL (ref 3.5–5.0)
Alkaline Phosphatase: 78 U/L (ref 38–126)
Anion gap: 11 (ref 5–15)
BUN: 39 mg/dL — ABNORMAL HIGH (ref 8–23)
CO2: 27 mmol/L (ref 22–32)
Calcium: 9.9 mg/dL (ref 8.9–10.3)
Chloride: 97 mmol/L — ABNORMAL LOW (ref 98–111)
Creatinine: 1.02 mg/dL (ref 0.61–1.24)
GFR, Estimated: >60 mL/min (ref >60)
Glucose, Bld: 94 mg/dL (ref 70–99)
Potassium: 4.6 mmol/L (ref 3.5–5.1)
Sodium: 135 mmol/L (ref 135–145)
Total Bilirubin: 0.3 mg/dL (ref <1.2)
Total Protein: 7.1 g/dL (ref 6.5–8.1)

## 2023-10-23 LAB — CBC WITH DIFFERENTIAL (CANCER CENTER ONLY)
Abs Immature Granulocytes: 0.03 10*3/uL (ref 0.00–0.07)
Basophils Absolute: 0.1 10*3/uL (ref 0.0–0.1)
Basophils Relative: 1 %
Eosinophils Absolute: 0.2 10*3/uL (ref 0.0–0.5)
Eosinophils Relative: 3 %
HCT: 29 % — ABNORMAL LOW (ref 39.0–52.0)
Hemoglobin: 9.8 g/dL — ABNORMAL LOW (ref 13.0–17.0)
Immature Granulocytes: 0 %
Lymphocytes Relative: 14 %
Lymphs Abs: 1.1 10*3/uL (ref 0.7–4.0)
MCH: 32.8 pg (ref 26.0–34.0)
MCHC: 33.8 g/dL (ref 30.0–36.0)
MCV: 97 fL (ref 80.0–100.0)
Monocytes Absolute: 0.5 10*3/uL (ref 0.1–1.0)
Monocytes Relative: 6 %
Neutro Abs: 5.8 10*3/uL (ref 1.7–7.7)
Neutrophils Relative %: 76 %
Platelet Count: 217 10*3/uL (ref 150–400)
RBC: 2.99 MIL/uL — ABNORMAL LOW (ref 4.22–5.81)
RDW: 14 % (ref 11.5–15.5)
WBC Count: 7.7 10*3/uL (ref 4.0–10.5)
nRBC: 0 % (ref 0.0–0.2)
nRBC: 0 /100{WBCs}

## 2023-10-23 LAB — FOLATE: Folate: 22.8 ng/mL (ref >5.9)

## 2023-10-23 LAB — FERRITIN: Ferritin: 202 ng/mL (ref 24–336)

## 2023-10-23 LAB — VITAMIN B12: Vitamin B-12: 1441 pg/mL — ABNORMAL HIGH (ref 180–914)

## 2023-10-23 NOTE — Telephone Encounter (Addendum)
10/23/23@ 1535- Pt called to get lab results. Pt notified that some of labs are back, some have not resulted. I will call him in am.   Latest Reference Range & Units 10/23/23 10:30 10/23/23 10:31  Ferritin 24 - 336 ng/mL  202  Folate >5.9 ng/mL 22.8   Vitamin B12 180 - 914 pg/mL 1,441 (H)   (H): Data is abnormally high

## 2023-10-24 ENCOUNTER — Encounter: Payer: Self-pay | Admitting: Oncology

## 2023-10-24 DIAGNOSIS — K219 Gastro-esophageal reflux disease without esophagitis: Secondary | ICD-10-CM | POA: Diagnosis not present

## 2023-10-24 DIAGNOSIS — I2510 Atherosclerotic heart disease of native coronary artery without angina pectoris: Secondary | ICD-10-CM | POA: Diagnosis not present

## 2023-10-24 DIAGNOSIS — G250 Essential tremor: Secondary | ICD-10-CM | POA: Diagnosis not present

## 2023-10-24 DIAGNOSIS — E43 Unspecified severe protein-calorie malnutrition: Secondary | ICD-10-CM | POA: Diagnosis not present

## 2023-10-24 DIAGNOSIS — H903 Sensorineural hearing loss, bilateral: Secondary | ICD-10-CM | POA: Diagnosis not present

## 2023-10-24 DIAGNOSIS — Z79899 Other long term (current) drug therapy: Secondary | ICD-10-CM | POA: Diagnosis not present

## 2023-10-24 DIAGNOSIS — Z955 Presence of coronary angioplasty implant and graft: Secondary | ICD-10-CM | POA: Diagnosis not present

## 2023-10-24 DIAGNOSIS — Z791 Long term (current) use of non-steroidal anti-inflammatories (NSAID): Secondary | ICD-10-CM | POA: Diagnosis not present

## 2023-10-24 DIAGNOSIS — R1312 Dysphagia, oropharyngeal phase: Secondary | ICD-10-CM | POA: Diagnosis not present

## 2023-10-24 DIAGNOSIS — K5900 Constipation, unspecified: Secondary | ICD-10-CM | POA: Diagnosis not present

## 2023-10-24 DIAGNOSIS — K9423 Gastrostomy malfunction: Secondary | ICD-10-CM | POA: Diagnosis not present

## 2023-10-24 DIAGNOSIS — Q2549 Other congenital malformations of aorta: Secondary | ICD-10-CM | POA: Diagnosis not present

## 2023-10-24 DIAGNOSIS — Z7982 Long term (current) use of aspirin: Secondary | ICD-10-CM | POA: Diagnosis not present

## 2023-10-24 DIAGNOSIS — E785 Hyperlipidemia, unspecified: Secondary | ICD-10-CM | POA: Diagnosis not present

## 2023-10-24 DIAGNOSIS — N189 Chronic kidney disease, unspecified: Secondary | ICD-10-CM | POA: Diagnosis not present

## 2023-10-24 DIAGNOSIS — M8580 Other specified disorders of bone density and structure, unspecified site: Secondary | ICD-10-CM | POA: Diagnosis not present

## 2023-10-24 DIAGNOSIS — M109 Gout, unspecified: Secondary | ICD-10-CM | POA: Diagnosis not present

## 2023-10-24 DIAGNOSIS — Z7951 Long term (current) use of inhaled steroids: Secondary | ICD-10-CM | POA: Diagnosis not present

## 2023-10-24 DIAGNOSIS — D631 Anemia in chronic kidney disease: Secondary | ICD-10-CM | POA: Diagnosis not present

## 2023-10-24 DIAGNOSIS — I129 Hypertensive chronic kidney disease with stage 1 through stage 4 chronic kidney disease, or unspecified chronic kidney disease: Secondary | ICD-10-CM | POA: Diagnosis not present

## 2023-10-24 DIAGNOSIS — J4489 Other specified chronic obstructive pulmonary disease: Secondary | ICD-10-CM | POA: Diagnosis not present

## 2023-10-27 ENCOUNTER — Inpatient Hospital Stay: Payer: Medicare HMO

## 2023-10-27 ENCOUNTER — Ambulatory Visit: Payer: Medicare HMO

## 2023-10-27 VITALS — BP 139/66 | HR 76 | Temp 98.0°F | Resp 18

## 2023-10-27 DIAGNOSIS — D631 Anemia in chronic kidney disease: Secondary | ICD-10-CM

## 2023-10-27 DIAGNOSIS — N182 Chronic kidney disease, stage 2 (mild): Secondary | ICD-10-CM | POA: Diagnosis not present

## 2023-10-27 DIAGNOSIS — C7951 Secondary malignant neoplasm of bone: Secondary | ICD-10-CM

## 2023-10-27 MED ORDER — DARBEPOETIN ALFA 300 MCG/0.6ML IJ SOSY
300.0000 ug | PREFILLED_SYRINGE | Freq: Once | INTRAMUSCULAR | Status: AC
Start: 2023-10-27 — End: 2023-10-27
  Administered 2023-10-27: 300 ug via SUBCUTANEOUS
  Filled 2023-10-27: qty 0.6

## 2023-10-27 NOTE — Patient Instructions (Signed)

## 2023-10-28 DIAGNOSIS — Z955 Presence of coronary angioplasty implant and graft: Secondary | ICD-10-CM | POA: Diagnosis not present

## 2023-10-28 DIAGNOSIS — Z7951 Long term (current) use of inhaled steroids: Secondary | ICD-10-CM | POA: Diagnosis not present

## 2023-10-28 DIAGNOSIS — Q2549 Other congenital malformations of aorta: Secondary | ICD-10-CM | POA: Diagnosis not present

## 2023-10-28 DIAGNOSIS — I129 Hypertensive chronic kidney disease with stage 1 through stage 4 chronic kidney disease, or unspecified chronic kidney disease: Secondary | ICD-10-CM | POA: Diagnosis not present

## 2023-10-28 DIAGNOSIS — H903 Sensorineural hearing loss, bilateral: Secondary | ICD-10-CM | POA: Diagnosis not present

## 2023-10-28 DIAGNOSIS — Z791 Long term (current) use of non-steroidal anti-inflammatories (NSAID): Secondary | ICD-10-CM | POA: Diagnosis not present

## 2023-10-28 DIAGNOSIS — N189 Chronic kidney disease, unspecified: Secondary | ICD-10-CM | POA: Diagnosis not present

## 2023-10-28 DIAGNOSIS — K5900 Constipation, unspecified: Secondary | ICD-10-CM | POA: Diagnosis not present

## 2023-10-28 DIAGNOSIS — J4489 Other specified chronic obstructive pulmonary disease: Secondary | ICD-10-CM | POA: Diagnosis not present

## 2023-10-28 DIAGNOSIS — M109 Gout, unspecified: Secondary | ICD-10-CM | POA: Diagnosis not present

## 2023-10-28 DIAGNOSIS — G250 Essential tremor: Secondary | ICD-10-CM | POA: Diagnosis not present

## 2023-10-28 DIAGNOSIS — R1312 Dysphagia, oropharyngeal phase: Secondary | ICD-10-CM | POA: Diagnosis not present

## 2023-10-28 DIAGNOSIS — D631 Anemia in chronic kidney disease: Secondary | ICD-10-CM | POA: Diagnosis not present

## 2023-10-28 DIAGNOSIS — K219 Gastro-esophageal reflux disease without esophagitis: Secondary | ICD-10-CM | POA: Diagnosis not present

## 2023-10-28 DIAGNOSIS — K9423 Gastrostomy malfunction: Secondary | ICD-10-CM | POA: Diagnosis not present

## 2023-10-28 DIAGNOSIS — Z7982 Long term (current) use of aspirin: Secondary | ICD-10-CM | POA: Diagnosis not present

## 2023-10-28 DIAGNOSIS — I2510 Atherosclerotic heart disease of native coronary artery without angina pectoris: Secondary | ICD-10-CM | POA: Diagnosis not present

## 2023-10-28 DIAGNOSIS — E43 Unspecified severe protein-calorie malnutrition: Secondary | ICD-10-CM | POA: Diagnosis not present

## 2023-10-28 DIAGNOSIS — R1310 Dysphagia, unspecified: Secondary | ICD-10-CM | POA: Diagnosis not present

## 2023-10-28 DIAGNOSIS — Z79899 Other long term (current) drug therapy: Secondary | ICD-10-CM | POA: Diagnosis not present

## 2023-10-28 DIAGNOSIS — M8580 Other specified disorders of bone density and structure, unspecified site: Secondary | ICD-10-CM | POA: Diagnosis not present

## 2023-10-28 DIAGNOSIS — E785 Hyperlipidemia, unspecified: Secondary | ICD-10-CM | POA: Diagnosis not present

## 2023-10-30 DIAGNOSIS — D631 Anemia in chronic kidney disease: Secondary | ICD-10-CM | POA: Diagnosis not present

## 2023-10-30 DIAGNOSIS — J4489 Other specified chronic obstructive pulmonary disease: Secondary | ICD-10-CM | POA: Diagnosis not present

## 2023-10-30 DIAGNOSIS — E785 Hyperlipidemia, unspecified: Secondary | ICD-10-CM | POA: Diagnosis not present

## 2023-10-30 DIAGNOSIS — I2510 Atherosclerotic heart disease of native coronary artery without angina pectoris: Secondary | ICD-10-CM | POA: Diagnosis not present

## 2023-10-30 DIAGNOSIS — H903 Sensorineural hearing loss, bilateral: Secondary | ICD-10-CM | POA: Diagnosis not present

## 2023-10-30 DIAGNOSIS — K9423 Gastrostomy malfunction: Secondary | ICD-10-CM | POA: Diagnosis not present

## 2023-10-30 DIAGNOSIS — Z955 Presence of coronary angioplasty implant and graft: Secondary | ICD-10-CM | POA: Diagnosis not present

## 2023-10-30 DIAGNOSIS — N189 Chronic kidney disease, unspecified: Secondary | ICD-10-CM | POA: Diagnosis not present

## 2023-10-30 DIAGNOSIS — I129 Hypertensive chronic kidney disease with stage 1 through stage 4 chronic kidney disease, or unspecified chronic kidney disease: Secondary | ICD-10-CM | POA: Diagnosis not present

## 2023-10-30 DIAGNOSIS — R1312 Dysphagia, oropharyngeal phase: Secondary | ICD-10-CM | POA: Diagnosis not present

## 2023-10-30 DIAGNOSIS — Z7951 Long term (current) use of inhaled steroids: Secondary | ICD-10-CM | POA: Diagnosis not present

## 2023-10-30 DIAGNOSIS — K5900 Constipation, unspecified: Secondary | ICD-10-CM | POA: Diagnosis not present

## 2023-10-30 DIAGNOSIS — Z79899 Other long term (current) drug therapy: Secondary | ICD-10-CM | POA: Diagnosis not present

## 2023-10-30 DIAGNOSIS — M109 Gout, unspecified: Secondary | ICD-10-CM | POA: Diagnosis not present

## 2023-10-30 DIAGNOSIS — K219 Gastro-esophageal reflux disease without esophagitis: Secondary | ICD-10-CM | POA: Diagnosis not present

## 2023-10-30 DIAGNOSIS — E43 Unspecified severe protein-calorie malnutrition: Secondary | ICD-10-CM | POA: Diagnosis not present

## 2023-10-30 DIAGNOSIS — M8580 Other specified disorders of bone density and structure, unspecified site: Secondary | ICD-10-CM | POA: Diagnosis not present

## 2023-10-30 DIAGNOSIS — Q2549 Other congenital malformations of aorta: Secondary | ICD-10-CM | POA: Diagnosis not present

## 2023-10-30 DIAGNOSIS — Z791 Long term (current) use of non-steroidal anti-inflammatories (NSAID): Secondary | ICD-10-CM | POA: Diagnosis not present

## 2023-10-30 DIAGNOSIS — G250 Essential tremor: Secondary | ICD-10-CM | POA: Diagnosis not present

## 2023-10-30 DIAGNOSIS — Z7982 Long term (current) use of aspirin: Secondary | ICD-10-CM | POA: Diagnosis not present

## 2023-10-31 ENCOUNTER — Telehealth: Payer: Self-pay

## 2023-10-31 NOTE — Telephone Encounter (Signed)
Labs were drawn on 10/23/2023. Hgb 9.8, Hct 29.0  Cr 1.02

## 2023-11-01 DIAGNOSIS — N189 Chronic kidney disease, unspecified: Secondary | ICD-10-CM | POA: Diagnosis not present

## 2023-11-01 DIAGNOSIS — R1312 Dysphagia, oropharyngeal phase: Secondary | ICD-10-CM | POA: Diagnosis not present

## 2023-11-01 DIAGNOSIS — M8580 Other specified disorders of bone density and structure, unspecified site: Secondary | ICD-10-CM | POA: Diagnosis not present

## 2023-11-01 DIAGNOSIS — I129 Hypertensive chronic kidney disease with stage 1 through stage 4 chronic kidney disease, or unspecified chronic kidney disease: Secondary | ICD-10-CM | POA: Diagnosis not present

## 2023-11-01 DIAGNOSIS — G250 Essential tremor: Secondary | ICD-10-CM | POA: Diagnosis not present

## 2023-11-01 DIAGNOSIS — I2510 Atherosclerotic heart disease of native coronary artery without angina pectoris: Secondary | ICD-10-CM | POA: Diagnosis not present

## 2023-11-01 DIAGNOSIS — K9423 Gastrostomy malfunction: Secondary | ICD-10-CM | POA: Diagnosis not present

## 2023-11-01 DIAGNOSIS — E43 Unspecified severe protein-calorie malnutrition: Secondary | ICD-10-CM | POA: Diagnosis not present

## 2023-11-01 DIAGNOSIS — Q2549 Other congenital malformations of aorta: Secondary | ICD-10-CM | POA: Diagnosis not present

## 2023-11-01 DIAGNOSIS — E785 Hyperlipidemia, unspecified: Secondary | ICD-10-CM | POA: Diagnosis not present

## 2023-11-01 DIAGNOSIS — Z955 Presence of coronary angioplasty implant and graft: Secondary | ICD-10-CM | POA: Diagnosis not present

## 2023-11-01 DIAGNOSIS — K5900 Constipation, unspecified: Secondary | ICD-10-CM | POA: Diagnosis not present

## 2023-11-01 DIAGNOSIS — D631 Anemia in chronic kidney disease: Secondary | ICD-10-CM | POA: Diagnosis not present

## 2023-11-01 DIAGNOSIS — J4489 Other specified chronic obstructive pulmonary disease: Secondary | ICD-10-CM | POA: Diagnosis not present

## 2023-11-01 DIAGNOSIS — Z7982 Long term (current) use of aspirin: Secondary | ICD-10-CM | POA: Diagnosis not present

## 2023-11-01 DIAGNOSIS — M109 Gout, unspecified: Secondary | ICD-10-CM | POA: Diagnosis not present

## 2023-11-01 DIAGNOSIS — Z7951 Long term (current) use of inhaled steroids: Secondary | ICD-10-CM | POA: Diagnosis not present

## 2023-11-01 DIAGNOSIS — Z791 Long term (current) use of non-steroidal anti-inflammatories (NSAID): Secondary | ICD-10-CM | POA: Diagnosis not present

## 2023-11-01 DIAGNOSIS — H903 Sensorineural hearing loss, bilateral: Secondary | ICD-10-CM | POA: Diagnosis not present

## 2023-11-01 DIAGNOSIS — Z79899 Other long term (current) drug therapy: Secondary | ICD-10-CM | POA: Diagnosis not present

## 2023-11-01 DIAGNOSIS — K219 Gastro-esophageal reflux disease without esophagitis: Secondary | ICD-10-CM | POA: Diagnosis not present

## 2023-11-04 DIAGNOSIS — R1312 Dysphagia, oropharyngeal phase: Secondary | ICD-10-CM | POA: Diagnosis not present

## 2023-11-04 DIAGNOSIS — Z79899 Other long term (current) drug therapy: Secondary | ICD-10-CM | POA: Diagnosis not present

## 2023-11-04 DIAGNOSIS — Z791 Long term (current) use of non-steroidal anti-inflammatories (NSAID): Secondary | ICD-10-CM | POA: Diagnosis not present

## 2023-11-04 DIAGNOSIS — E785 Hyperlipidemia, unspecified: Secondary | ICD-10-CM | POA: Diagnosis not present

## 2023-11-04 DIAGNOSIS — K5900 Constipation, unspecified: Secondary | ICD-10-CM | POA: Diagnosis not present

## 2023-11-04 DIAGNOSIS — N189 Chronic kidney disease, unspecified: Secondary | ICD-10-CM | POA: Diagnosis not present

## 2023-11-04 DIAGNOSIS — H903 Sensorineural hearing loss, bilateral: Secondary | ICD-10-CM | POA: Diagnosis not present

## 2023-11-04 DIAGNOSIS — E43 Unspecified severe protein-calorie malnutrition: Secondary | ICD-10-CM | POA: Diagnosis not present

## 2023-11-04 DIAGNOSIS — Q2549 Other congenital malformations of aorta: Secondary | ICD-10-CM | POA: Diagnosis not present

## 2023-11-04 DIAGNOSIS — Z955 Presence of coronary angioplasty implant and graft: Secondary | ICD-10-CM | POA: Diagnosis not present

## 2023-11-04 DIAGNOSIS — K9423 Gastrostomy malfunction: Secondary | ICD-10-CM | POA: Diagnosis not present

## 2023-11-04 DIAGNOSIS — G250 Essential tremor: Secondary | ICD-10-CM | POA: Diagnosis not present

## 2023-11-04 DIAGNOSIS — I129 Hypertensive chronic kidney disease with stage 1 through stage 4 chronic kidney disease, or unspecified chronic kidney disease: Secondary | ICD-10-CM | POA: Diagnosis not present

## 2023-11-04 DIAGNOSIS — K219 Gastro-esophageal reflux disease without esophagitis: Secondary | ICD-10-CM | POA: Diagnosis not present

## 2023-11-04 DIAGNOSIS — I2510 Atherosclerotic heart disease of native coronary artery without angina pectoris: Secondary | ICD-10-CM | POA: Diagnosis not present

## 2023-11-04 DIAGNOSIS — Z7982 Long term (current) use of aspirin: Secondary | ICD-10-CM | POA: Diagnosis not present

## 2023-11-04 DIAGNOSIS — J4489 Other specified chronic obstructive pulmonary disease: Secondary | ICD-10-CM | POA: Diagnosis not present

## 2023-11-04 DIAGNOSIS — Z7951 Long term (current) use of inhaled steroids: Secondary | ICD-10-CM | POA: Diagnosis not present

## 2023-11-04 DIAGNOSIS — D631 Anemia in chronic kidney disease: Secondary | ICD-10-CM | POA: Diagnosis not present

## 2023-11-04 DIAGNOSIS — M8580 Other specified disorders of bone density and structure, unspecified site: Secondary | ICD-10-CM | POA: Diagnosis not present

## 2023-11-04 DIAGNOSIS — M109 Gout, unspecified: Secondary | ICD-10-CM | POA: Diagnosis not present

## 2023-11-05 DIAGNOSIS — R1310 Dysphagia, unspecified: Secondary | ICD-10-CM | POA: Diagnosis not present

## 2023-11-06 DIAGNOSIS — I2510 Atherosclerotic heart disease of native coronary artery without angina pectoris: Secondary | ICD-10-CM | POA: Diagnosis not present

## 2023-11-06 DIAGNOSIS — N189 Chronic kidney disease, unspecified: Secondary | ICD-10-CM | POA: Diagnosis not present

## 2023-11-06 DIAGNOSIS — Z791 Long term (current) use of non-steroidal anti-inflammatories (NSAID): Secondary | ICD-10-CM | POA: Diagnosis not present

## 2023-11-06 DIAGNOSIS — Z7982 Long term (current) use of aspirin: Secondary | ICD-10-CM | POA: Diagnosis not present

## 2023-11-06 DIAGNOSIS — M8580 Other specified disorders of bone density and structure, unspecified site: Secondary | ICD-10-CM | POA: Diagnosis not present

## 2023-11-06 DIAGNOSIS — K9423 Gastrostomy malfunction: Secondary | ICD-10-CM | POA: Diagnosis not present

## 2023-11-06 DIAGNOSIS — Z955 Presence of coronary angioplasty implant and graft: Secondary | ICD-10-CM | POA: Diagnosis not present

## 2023-11-06 DIAGNOSIS — Q2549 Other congenital malformations of aorta: Secondary | ICD-10-CM | POA: Diagnosis not present

## 2023-11-06 DIAGNOSIS — G250 Essential tremor: Secondary | ICD-10-CM | POA: Diagnosis not present

## 2023-11-06 DIAGNOSIS — Z7951 Long term (current) use of inhaled steroids: Secondary | ICD-10-CM | POA: Diagnosis not present

## 2023-11-06 DIAGNOSIS — K219 Gastro-esophageal reflux disease without esophagitis: Secondary | ICD-10-CM | POA: Diagnosis not present

## 2023-11-06 DIAGNOSIS — K5900 Constipation, unspecified: Secondary | ICD-10-CM | POA: Diagnosis not present

## 2023-11-06 DIAGNOSIS — Z79899 Other long term (current) drug therapy: Secondary | ICD-10-CM | POA: Diagnosis not present

## 2023-11-06 DIAGNOSIS — I129 Hypertensive chronic kidney disease with stage 1 through stage 4 chronic kidney disease, or unspecified chronic kidney disease: Secondary | ICD-10-CM | POA: Diagnosis not present

## 2023-11-06 DIAGNOSIS — H903 Sensorineural hearing loss, bilateral: Secondary | ICD-10-CM | POA: Diagnosis not present

## 2023-11-06 DIAGNOSIS — E785 Hyperlipidemia, unspecified: Secondary | ICD-10-CM | POA: Diagnosis not present

## 2023-11-06 DIAGNOSIS — R1312 Dysphagia, oropharyngeal phase: Secondary | ICD-10-CM | POA: Diagnosis not present

## 2023-11-06 DIAGNOSIS — D631 Anemia in chronic kidney disease: Secondary | ICD-10-CM | POA: Diagnosis not present

## 2023-11-06 DIAGNOSIS — J4489 Other specified chronic obstructive pulmonary disease: Secondary | ICD-10-CM | POA: Diagnosis not present

## 2023-11-06 DIAGNOSIS — M109 Gout, unspecified: Secondary | ICD-10-CM | POA: Diagnosis not present

## 2023-11-06 DIAGNOSIS — E43 Unspecified severe protein-calorie malnutrition: Secondary | ICD-10-CM | POA: Diagnosis not present

## 2023-11-08 ENCOUNTER — Encounter: Payer: Self-pay | Admitting: Oncology

## 2023-11-10 DIAGNOSIS — J4489 Other specified chronic obstructive pulmonary disease: Secondary | ICD-10-CM | POA: Diagnosis not present

## 2023-11-10 DIAGNOSIS — H903 Sensorineural hearing loss, bilateral: Secondary | ICD-10-CM | POA: Diagnosis not present

## 2023-11-10 DIAGNOSIS — I2510 Atherosclerotic heart disease of native coronary artery without angina pectoris: Secondary | ICD-10-CM | POA: Diagnosis not present

## 2023-11-10 DIAGNOSIS — D631 Anemia in chronic kidney disease: Secondary | ICD-10-CM | POA: Diagnosis not present

## 2023-11-10 DIAGNOSIS — Z7951 Long term (current) use of inhaled steroids: Secondary | ICD-10-CM | POA: Diagnosis not present

## 2023-11-10 DIAGNOSIS — R1312 Dysphagia, oropharyngeal phase: Secondary | ICD-10-CM | POA: Diagnosis not present

## 2023-11-10 DIAGNOSIS — Z7982 Long term (current) use of aspirin: Secondary | ICD-10-CM | POA: Diagnosis not present

## 2023-11-10 DIAGNOSIS — I129 Hypertensive chronic kidney disease with stage 1 through stage 4 chronic kidney disease, or unspecified chronic kidney disease: Secondary | ICD-10-CM | POA: Diagnosis not present

## 2023-11-10 DIAGNOSIS — E43 Unspecified severe protein-calorie malnutrition: Secondary | ICD-10-CM | POA: Diagnosis not present

## 2023-11-10 DIAGNOSIS — Z79899 Other long term (current) drug therapy: Secondary | ICD-10-CM | POA: Diagnosis not present

## 2023-11-10 DIAGNOSIS — Q2549 Other congenital malformations of aorta: Secondary | ICD-10-CM | POA: Diagnosis not present

## 2023-11-10 DIAGNOSIS — N189 Chronic kidney disease, unspecified: Secondary | ICD-10-CM | POA: Diagnosis not present

## 2023-11-10 DIAGNOSIS — Z791 Long term (current) use of non-steroidal anti-inflammatories (NSAID): Secondary | ICD-10-CM | POA: Diagnosis not present

## 2023-11-10 DIAGNOSIS — M109 Gout, unspecified: Secondary | ICD-10-CM | POA: Diagnosis not present

## 2023-11-10 DIAGNOSIS — K219 Gastro-esophageal reflux disease without esophagitis: Secondary | ICD-10-CM | POA: Diagnosis not present

## 2023-11-10 DIAGNOSIS — K9423 Gastrostomy malfunction: Secondary | ICD-10-CM | POA: Diagnosis not present

## 2023-11-10 DIAGNOSIS — M8580 Other specified disorders of bone density and structure, unspecified site: Secondary | ICD-10-CM | POA: Diagnosis not present

## 2023-11-10 DIAGNOSIS — G250 Essential tremor: Secondary | ICD-10-CM | POA: Diagnosis not present

## 2023-11-10 DIAGNOSIS — K5900 Constipation, unspecified: Secondary | ICD-10-CM | POA: Diagnosis not present

## 2023-11-10 DIAGNOSIS — E785 Hyperlipidemia, unspecified: Secondary | ICD-10-CM | POA: Diagnosis not present

## 2023-11-10 DIAGNOSIS — Z955 Presence of coronary angioplasty implant and graft: Secondary | ICD-10-CM | POA: Diagnosis not present

## 2023-11-11 DIAGNOSIS — Q2549 Other congenital malformations of aorta: Secondary | ICD-10-CM | POA: Diagnosis not present

## 2023-11-11 DIAGNOSIS — D631 Anemia in chronic kidney disease: Secondary | ICD-10-CM | POA: Diagnosis not present

## 2023-11-11 DIAGNOSIS — N189 Chronic kidney disease, unspecified: Secondary | ICD-10-CM | POA: Diagnosis not present

## 2023-11-11 DIAGNOSIS — J4489 Other specified chronic obstructive pulmonary disease: Secondary | ICD-10-CM | POA: Diagnosis not present

## 2023-11-11 DIAGNOSIS — K5900 Constipation, unspecified: Secondary | ICD-10-CM | POA: Diagnosis not present

## 2023-11-11 DIAGNOSIS — Z7951 Long term (current) use of inhaled steroids: Secondary | ICD-10-CM | POA: Diagnosis not present

## 2023-11-11 DIAGNOSIS — M8580 Other specified disorders of bone density and structure, unspecified site: Secondary | ICD-10-CM | POA: Diagnosis not present

## 2023-11-11 DIAGNOSIS — H903 Sensorineural hearing loss, bilateral: Secondary | ICD-10-CM | POA: Diagnosis not present

## 2023-11-11 DIAGNOSIS — Z955 Presence of coronary angioplasty implant and graft: Secondary | ICD-10-CM | POA: Diagnosis not present

## 2023-11-11 DIAGNOSIS — E43 Unspecified severe protein-calorie malnutrition: Secondary | ICD-10-CM | POA: Diagnosis not present

## 2023-11-11 DIAGNOSIS — M109 Gout, unspecified: Secondary | ICD-10-CM | POA: Diagnosis not present

## 2023-11-11 DIAGNOSIS — Z7982 Long term (current) use of aspirin: Secondary | ICD-10-CM | POA: Diagnosis not present

## 2023-11-11 DIAGNOSIS — K9423 Gastrostomy malfunction: Secondary | ICD-10-CM | POA: Diagnosis not present

## 2023-11-11 DIAGNOSIS — I2510 Atherosclerotic heart disease of native coronary artery without angina pectoris: Secondary | ICD-10-CM | POA: Diagnosis not present

## 2023-11-11 DIAGNOSIS — Z791 Long term (current) use of non-steroidal anti-inflammatories (NSAID): Secondary | ICD-10-CM | POA: Diagnosis not present

## 2023-11-11 DIAGNOSIS — I129 Hypertensive chronic kidney disease with stage 1 through stage 4 chronic kidney disease, or unspecified chronic kidney disease: Secondary | ICD-10-CM | POA: Diagnosis not present

## 2023-11-11 DIAGNOSIS — R1312 Dysphagia, oropharyngeal phase: Secondary | ICD-10-CM | POA: Diagnosis not present

## 2023-11-11 DIAGNOSIS — E785 Hyperlipidemia, unspecified: Secondary | ICD-10-CM | POA: Diagnosis not present

## 2023-11-11 DIAGNOSIS — K219 Gastro-esophageal reflux disease without esophagitis: Secondary | ICD-10-CM | POA: Diagnosis not present

## 2023-11-11 DIAGNOSIS — G250 Essential tremor: Secondary | ICD-10-CM | POA: Diagnosis not present

## 2023-11-11 DIAGNOSIS — Z79899 Other long term (current) drug therapy: Secondary | ICD-10-CM | POA: Diagnosis not present

## 2023-11-14 DIAGNOSIS — D631 Anemia in chronic kidney disease: Secondary | ICD-10-CM | POA: Diagnosis not present

## 2023-11-14 DIAGNOSIS — N189 Chronic kidney disease, unspecified: Secondary | ICD-10-CM | POA: Diagnosis not present

## 2023-11-14 DIAGNOSIS — K219 Gastro-esophageal reflux disease without esophagitis: Secondary | ICD-10-CM | POA: Diagnosis not present

## 2023-11-14 DIAGNOSIS — Z7951 Long term (current) use of inhaled steroids: Secondary | ICD-10-CM | POA: Diagnosis not present

## 2023-11-14 DIAGNOSIS — G250 Essential tremor: Secondary | ICD-10-CM | POA: Diagnosis not present

## 2023-11-14 DIAGNOSIS — E43 Unspecified severe protein-calorie malnutrition: Secondary | ICD-10-CM | POA: Diagnosis not present

## 2023-11-14 DIAGNOSIS — I129 Hypertensive chronic kidney disease with stage 1 through stage 4 chronic kidney disease, or unspecified chronic kidney disease: Secondary | ICD-10-CM | POA: Diagnosis not present

## 2023-11-14 DIAGNOSIS — Z7982 Long term (current) use of aspirin: Secondary | ICD-10-CM | POA: Diagnosis not present

## 2023-11-14 DIAGNOSIS — E785 Hyperlipidemia, unspecified: Secondary | ICD-10-CM | POA: Diagnosis not present

## 2023-11-14 DIAGNOSIS — Q2549 Other congenital malformations of aorta: Secondary | ICD-10-CM | POA: Diagnosis not present

## 2023-11-14 DIAGNOSIS — K5900 Constipation, unspecified: Secondary | ICD-10-CM | POA: Diagnosis not present

## 2023-11-14 DIAGNOSIS — K9423 Gastrostomy malfunction: Secondary | ICD-10-CM | POA: Diagnosis not present

## 2023-11-14 DIAGNOSIS — H903 Sensorineural hearing loss, bilateral: Secondary | ICD-10-CM | POA: Diagnosis not present

## 2023-11-14 DIAGNOSIS — Z79899 Other long term (current) drug therapy: Secondary | ICD-10-CM | POA: Diagnosis not present

## 2023-11-14 DIAGNOSIS — J4489 Other specified chronic obstructive pulmonary disease: Secondary | ICD-10-CM | POA: Diagnosis not present

## 2023-11-14 DIAGNOSIS — M8580 Other specified disorders of bone density and structure, unspecified site: Secondary | ICD-10-CM | POA: Diagnosis not present

## 2023-11-14 DIAGNOSIS — Z791 Long term (current) use of non-steroidal anti-inflammatories (NSAID): Secondary | ICD-10-CM | POA: Diagnosis not present

## 2023-11-14 DIAGNOSIS — M109 Gout, unspecified: Secondary | ICD-10-CM | POA: Diagnosis not present

## 2023-11-14 DIAGNOSIS — I2510 Atherosclerotic heart disease of native coronary artery without angina pectoris: Secondary | ICD-10-CM | POA: Diagnosis not present

## 2023-11-14 DIAGNOSIS — Z955 Presence of coronary angioplasty implant and graft: Secondary | ICD-10-CM | POA: Diagnosis not present

## 2023-11-14 DIAGNOSIS — R1312 Dysphagia, oropharyngeal phase: Secondary | ICD-10-CM | POA: Diagnosis not present

## 2023-11-18 DIAGNOSIS — G250 Essential tremor: Secondary | ICD-10-CM | POA: Diagnosis not present

## 2023-11-18 DIAGNOSIS — K219 Gastro-esophageal reflux disease without esophagitis: Secondary | ICD-10-CM | POA: Diagnosis not present

## 2023-11-18 DIAGNOSIS — M109 Gout, unspecified: Secondary | ICD-10-CM | POA: Diagnosis not present

## 2023-11-18 DIAGNOSIS — H903 Sensorineural hearing loss, bilateral: Secondary | ICD-10-CM | POA: Diagnosis not present

## 2023-11-18 DIAGNOSIS — N189 Chronic kidney disease, unspecified: Secondary | ICD-10-CM | POA: Diagnosis not present

## 2023-11-18 DIAGNOSIS — E785 Hyperlipidemia, unspecified: Secondary | ICD-10-CM | POA: Diagnosis not present

## 2023-11-18 DIAGNOSIS — I129 Hypertensive chronic kidney disease with stage 1 through stage 4 chronic kidney disease, or unspecified chronic kidney disease: Secondary | ICD-10-CM | POA: Diagnosis not present

## 2023-11-18 DIAGNOSIS — E43 Unspecified severe protein-calorie malnutrition: Secondary | ICD-10-CM | POA: Diagnosis not present

## 2023-11-18 DIAGNOSIS — I2510 Atherosclerotic heart disease of native coronary artery without angina pectoris: Secondary | ICD-10-CM | POA: Diagnosis not present

## 2023-11-18 DIAGNOSIS — R1312 Dysphagia, oropharyngeal phase: Secondary | ICD-10-CM | POA: Diagnosis not present

## 2023-11-18 DIAGNOSIS — J4489 Other specified chronic obstructive pulmonary disease: Secondary | ICD-10-CM | POA: Diagnosis not present

## 2023-11-18 DIAGNOSIS — K5900 Constipation, unspecified: Secondary | ICD-10-CM | POA: Diagnosis not present

## 2023-11-18 DIAGNOSIS — Z79899 Other long term (current) drug therapy: Secondary | ICD-10-CM | POA: Diagnosis not present

## 2023-11-18 DIAGNOSIS — D631 Anemia in chronic kidney disease: Secondary | ICD-10-CM | POA: Diagnosis not present

## 2023-11-18 DIAGNOSIS — K9423 Gastrostomy malfunction: Secondary | ICD-10-CM | POA: Diagnosis not present

## 2023-11-18 DIAGNOSIS — M8580 Other specified disorders of bone density and structure, unspecified site: Secondary | ICD-10-CM | POA: Diagnosis not present

## 2023-11-18 DIAGNOSIS — Z7951 Long term (current) use of inhaled steroids: Secondary | ICD-10-CM | POA: Diagnosis not present

## 2023-11-18 DIAGNOSIS — Z7982 Long term (current) use of aspirin: Secondary | ICD-10-CM | POA: Diagnosis not present

## 2023-11-18 DIAGNOSIS — Q2549 Other congenital malformations of aorta: Secondary | ICD-10-CM | POA: Diagnosis not present

## 2023-11-18 DIAGNOSIS — Z791 Long term (current) use of non-steroidal anti-inflammatories (NSAID): Secondary | ICD-10-CM | POA: Diagnosis not present

## 2023-11-18 DIAGNOSIS — Z955 Presence of coronary angioplasty implant and graft: Secondary | ICD-10-CM | POA: Diagnosis not present

## 2023-11-19 DIAGNOSIS — E785 Hyperlipidemia, unspecified: Secondary | ICD-10-CM | POA: Diagnosis not present

## 2023-11-19 DIAGNOSIS — Z7982 Long term (current) use of aspirin: Secondary | ICD-10-CM | POA: Diagnosis not present

## 2023-11-19 DIAGNOSIS — M109 Gout, unspecified: Secondary | ICD-10-CM | POA: Diagnosis not present

## 2023-11-19 DIAGNOSIS — Z79899 Other long term (current) drug therapy: Secondary | ICD-10-CM | POA: Diagnosis not present

## 2023-11-19 DIAGNOSIS — Q2549 Other congenital malformations of aorta: Secondary | ICD-10-CM | POA: Diagnosis not present

## 2023-11-19 DIAGNOSIS — I2510 Atherosclerotic heart disease of native coronary artery without angina pectoris: Secondary | ICD-10-CM | POA: Diagnosis not present

## 2023-11-19 DIAGNOSIS — D631 Anemia in chronic kidney disease: Secondary | ICD-10-CM | POA: Diagnosis not present

## 2023-11-19 DIAGNOSIS — E43 Unspecified severe protein-calorie malnutrition: Secondary | ICD-10-CM | POA: Diagnosis not present

## 2023-11-19 DIAGNOSIS — K9423 Gastrostomy malfunction: Secondary | ICD-10-CM | POA: Diagnosis not present

## 2023-11-19 DIAGNOSIS — N189 Chronic kidney disease, unspecified: Secondary | ICD-10-CM | POA: Diagnosis not present

## 2023-11-19 DIAGNOSIS — Z791 Long term (current) use of non-steroidal anti-inflammatories (NSAID): Secondary | ICD-10-CM | POA: Diagnosis not present

## 2023-11-19 DIAGNOSIS — K219 Gastro-esophageal reflux disease without esophagitis: Secondary | ICD-10-CM | POA: Diagnosis not present

## 2023-11-19 DIAGNOSIS — M8580 Other specified disorders of bone density and structure, unspecified site: Secondary | ICD-10-CM | POA: Diagnosis not present

## 2023-11-19 DIAGNOSIS — K5900 Constipation, unspecified: Secondary | ICD-10-CM | POA: Diagnosis not present

## 2023-11-19 DIAGNOSIS — Z955 Presence of coronary angioplasty implant and graft: Secondary | ICD-10-CM | POA: Diagnosis not present

## 2023-11-19 DIAGNOSIS — J4489 Other specified chronic obstructive pulmonary disease: Secondary | ICD-10-CM | POA: Diagnosis not present

## 2023-11-19 DIAGNOSIS — R1312 Dysphagia, oropharyngeal phase: Secondary | ICD-10-CM | POA: Diagnosis not present

## 2023-11-19 DIAGNOSIS — I129 Hypertensive chronic kidney disease with stage 1 through stage 4 chronic kidney disease, or unspecified chronic kidney disease: Secondary | ICD-10-CM | POA: Diagnosis not present

## 2023-11-19 DIAGNOSIS — G250 Essential tremor: Secondary | ICD-10-CM | POA: Diagnosis not present

## 2023-11-19 DIAGNOSIS — Z7951 Long term (current) use of inhaled steroids: Secondary | ICD-10-CM | POA: Diagnosis not present

## 2023-11-19 DIAGNOSIS — H903 Sensorineural hearing loss, bilateral: Secondary | ICD-10-CM | POA: Diagnosis not present

## 2023-11-24 ENCOUNTER — Inpatient Hospital Stay: Payer: Medicare HMO

## 2023-11-24 ENCOUNTER — Inpatient Hospital Stay: Payer: Medicare HMO | Attending: Oncology

## 2023-11-24 ENCOUNTER — Other Ambulatory Visit: Payer: Self-pay

## 2023-11-24 VITALS — BP 122/64 | HR 64 | Temp 98.0°F | Resp 18 | Ht 70.0 in | Wt 132.5 lb

## 2023-11-24 DIAGNOSIS — D631 Anemia in chronic kidney disease: Secondary | ICD-10-CM

## 2023-11-24 DIAGNOSIS — N182 Chronic kidney disease, stage 2 (mild): Secondary | ICD-10-CM | POA: Insufficient documentation

## 2023-11-24 LAB — CBC WITH DIFFERENTIAL (CANCER CENTER ONLY)
Abs Immature Granulocytes: 0.02 10*3/uL (ref 0.00–0.07)
Basophils Absolute: 0.1 10*3/uL (ref 0.0–0.1)
Basophils Relative: 1 %
Eosinophils Absolute: 0.3 10*3/uL (ref 0.0–0.5)
Eosinophils Relative: 4 %
HCT: 33.1 % — ABNORMAL LOW (ref 39.0–52.0)
Hemoglobin: 10.8 g/dL — ABNORMAL LOW (ref 13.0–17.0)
Immature Granulocytes: 0 %
Lymphocytes Relative: 12 %
Lymphs Abs: 0.7 10*3/uL (ref 0.7–4.0)
MCH: 31.1 pg (ref 26.0–34.0)
MCHC: 32.6 g/dL (ref 30.0–36.0)
MCV: 95.4 fL (ref 80.0–100.0)
Monocytes Absolute: 0.5 10*3/uL (ref 0.1–1.0)
Monocytes Relative: 8 %
Neutro Abs: 4.7 10*3/uL (ref 1.7–7.7)
Neutrophils Relative %: 75 %
Platelet Count: 188 10*3/uL (ref 150–400)
RBC: 3.47 MIL/uL — ABNORMAL LOW (ref 4.22–5.81)
RDW: 14.7 % (ref 11.5–15.5)
WBC Count: 6.3 10*3/uL (ref 4.0–10.5)
nRBC: 0 % (ref 0.0–0.2)
nRBC: 0 /100{WBCs}

## 2023-11-24 MED ORDER — DARBEPOETIN ALFA 300 MCG/0.6ML IJ SOSY
300.0000 ug | PREFILLED_SYRINGE | Freq: Once | INTRAMUSCULAR | Status: AC
Start: 2023-11-24 — End: 2023-11-24
  Administered 2023-11-24: 300 ug via SUBCUTANEOUS
  Filled 2023-11-24: qty 0.6

## 2023-11-24 NOTE — Patient Instructions (Signed)

## 2023-11-27 DIAGNOSIS — Z4659 Encounter for fitting and adjustment of other gastrointestinal appliance and device: Secondary | ICD-10-CM | POA: Diagnosis not present

## 2023-12-01 DIAGNOSIS — R1310 Dysphagia, unspecified: Secondary | ICD-10-CM | POA: Diagnosis not present

## 2023-12-02 DIAGNOSIS — E43 Unspecified severe protein-calorie malnutrition: Secondary | ICD-10-CM | POA: Diagnosis not present

## 2023-12-02 DIAGNOSIS — D631 Anemia in chronic kidney disease: Secondary | ICD-10-CM | POA: Diagnosis not present

## 2023-12-02 DIAGNOSIS — I2510 Atherosclerotic heart disease of native coronary artery without angina pectoris: Secondary | ICD-10-CM | POA: Diagnosis not present

## 2023-12-02 DIAGNOSIS — Z955 Presence of coronary angioplasty implant and graft: Secondary | ICD-10-CM | POA: Diagnosis not present

## 2023-12-02 DIAGNOSIS — R1312 Dysphagia, oropharyngeal phase: Secondary | ICD-10-CM | POA: Diagnosis not present

## 2023-12-02 DIAGNOSIS — I129 Hypertensive chronic kidney disease with stage 1 through stage 4 chronic kidney disease, or unspecified chronic kidney disease: Secondary | ICD-10-CM | POA: Diagnosis not present

## 2023-12-02 DIAGNOSIS — E785 Hyperlipidemia, unspecified: Secondary | ICD-10-CM | POA: Diagnosis not present

## 2023-12-02 DIAGNOSIS — K219 Gastro-esophageal reflux disease without esophagitis: Secondary | ICD-10-CM | POA: Diagnosis not present

## 2023-12-02 DIAGNOSIS — H903 Sensorineural hearing loss, bilateral: Secondary | ICD-10-CM | POA: Diagnosis not present

## 2023-12-02 DIAGNOSIS — M8580 Other specified disorders of bone density and structure, unspecified site: Secondary | ICD-10-CM | POA: Diagnosis not present

## 2023-12-02 DIAGNOSIS — M109 Gout, unspecified: Secondary | ICD-10-CM | POA: Diagnosis not present

## 2023-12-02 DIAGNOSIS — Q2549 Other congenital malformations of aorta: Secondary | ICD-10-CM | POA: Diagnosis not present

## 2023-12-02 DIAGNOSIS — R1310 Dysphagia, unspecified: Secondary | ICD-10-CM | POA: Diagnosis not present

## 2023-12-02 DIAGNOSIS — Z79899 Other long term (current) drug therapy: Secondary | ICD-10-CM | POA: Diagnosis not present

## 2023-12-02 DIAGNOSIS — G250 Essential tremor: Secondary | ICD-10-CM | POA: Diagnosis not present

## 2023-12-02 DIAGNOSIS — N189 Chronic kidney disease, unspecified: Secondary | ICD-10-CM | POA: Diagnosis not present

## 2023-12-02 DIAGNOSIS — Z791 Long term (current) use of non-steroidal anti-inflammatories (NSAID): Secondary | ICD-10-CM | POA: Diagnosis not present

## 2023-12-02 DIAGNOSIS — Z7951 Long term (current) use of inhaled steroids: Secondary | ICD-10-CM | POA: Diagnosis not present

## 2023-12-02 DIAGNOSIS — Z7982 Long term (current) use of aspirin: Secondary | ICD-10-CM | POA: Diagnosis not present

## 2023-12-02 DIAGNOSIS — K5900 Constipation, unspecified: Secondary | ICD-10-CM | POA: Diagnosis not present

## 2023-12-02 DIAGNOSIS — J4489 Other specified chronic obstructive pulmonary disease: Secondary | ICD-10-CM | POA: Diagnosis not present

## 2023-12-02 DIAGNOSIS — K9423 Gastrostomy malfunction: Secondary | ICD-10-CM | POA: Diagnosis not present

## 2023-12-03 DIAGNOSIS — R1310 Dysphagia, unspecified: Secondary | ICD-10-CM | POA: Diagnosis not present

## 2023-12-04 ENCOUNTER — Telehealth (HOSPITAL_COMMUNITY): Payer: Self-pay

## 2023-12-04 DIAGNOSIS — R1310 Dysphagia, unspecified: Secondary | ICD-10-CM | POA: Diagnosis not present

## 2023-12-04 NOTE — Telephone Encounter (Signed)
Called [facility] to inform them that per IR "Unfortunately we do not offer a "tube with a clamp" for exchanges. The only clamped tube we have is for initial pull thru G-tubes. At this point Brian Dunn would need to have it switched out for a regular balloon retention tube". Left vm for triage nurse to call back to let me know what they would like to do. AB

## 2023-12-05 DIAGNOSIS — N189 Chronic kidney disease, unspecified: Secondary | ICD-10-CM | POA: Diagnosis not present

## 2023-12-05 DIAGNOSIS — Z791 Long term (current) use of non-steroidal anti-inflammatories (NSAID): Secondary | ICD-10-CM | POA: Diagnosis not present

## 2023-12-05 DIAGNOSIS — K219 Gastro-esophageal reflux disease without esophagitis: Secondary | ICD-10-CM | POA: Diagnosis not present

## 2023-12-05 DIAGNOSIS — I2510 Atherosclerotic heart disease of native coronary artery without angina pectoris: Secondary | ICD-10-CM | POA: Diagnosis not present

## 2023-12-05 DIAGNOSIS — E43 Unspecified severe protein-calorie malnutrition: Secondary | ICD-10-CM | POA: Diagnosis not present

## 2023-12-05 DIAGNOSIS — J4489 Other specified chronic obstructive pulmonary disease: Secondary | ICD-10-CM | POA: Diagnosis not present

## 2023-12-05 DIAGNOSIS — M109 Gout, unspecified: Secondary | ICD-10-CM | POA: Diagnosis not present

## 2023-12-05 DIAGNOSIS — D631 Anemia in chronic kidney disease: Secondary | ICD-10-CM | POA: Diagnosis not present

## 2023-12-05 DIAGNOSIS — G250 Essential tremor: Secondary | ICD-10-CM | POA: Diagnosis not present

## 2023-12-05 DIAGNOSIS — H903 Sensorineural hearing loss, bilateral: Secondary | ICD-10-CM | POA: Diagnosis not present

## 2023-12-05 DIAGNOSIS — Q2549 Other congenital malformations of aorta: Secondary | ICD-10-CM | POA: Diagnosis not present

## 2023-12-05 DIAGNOSIS — R1312 Dysphagia, oropharyngeal phase: Secondary | ICD-10-CM | POA: Diagnosis not present

## 2023-12-05 DIAGNOSIS — K9423 Gastrostomy malfunction: Secondary | ICD-10-CM | POA: Diagnosis not present

## 2023-12-05 DIAGNOSIS — M8580 Other specified disorders of bone density and structure, unspecified site: Secondary | ICD-10-CM | POA: Diagnosis not present

## 2023-12-05 DIAGNOSIS — Z79899 Other long term (current) drug therapy: Secondary | ICD-10-CM | POA: Diagnosis not present

## 2023-12-05 DIAGNOSIS — R1310 Dysphagia, unspecified: Secondary | ICD-10-CM | POA: Diagnosis not present

## 2023-12-05 DIAGNOSIS — Z955 Presence of coronary angioplasty implant and graft: Secondary | ICD-10-CM | POA: Diagnosis not present

## 2023-12-05 DIAGNOSIS — Z7982 Long term (current) use of aspirin: Secondary | ICD-10-CM | POA: Diagnosis not present

## 2023-12-05 DIAGNOSIS — E785 Hyperlipidemia, unspecified: Secondary | ICD-10-CM | POA: Diagnosis not present

## 2023-12-05 DIAGNOSIS — I129 Hypertensive chronic kidney disease with stage 1 through stage 4 chronic kidney disease, or unspecified chronic kidney disease: Secondary | ICD-10-CM | POA: Diagnosis not present

## 2023-12-05 DIAGNOSIS — Z7951 Long term (current) use of inhaled steroids: Secondary | ICD-10-CM | POA: Diagnosis not present

## 2023-12-05 DIAGNOSIS — K5900 Constipation, unspecified: Secondary | ICD-10-CM | POA: Diagnosis not present

## 2023-12-06 DIAGNOSIS — R1310 Dysphagia, unspecified: Secondary | ICD-10-CM | POA: Diagnosis not present

## 2023-12-07 DIAGNOSIS — R1310 Dysphagia, unspecified: Secondary | ICD-10-CM | POA: Diagnosis not present

## 2023-12-08 DIAGNOSIS — Z7982 Long term (current) use of aspirin: Secondary | ICD-10-CM | POA: Diagnosis not present

## 2023-12-08 DIAGNOSIS — Q2549 Other congenital malformations of aorta: Secondary | ICD-10-CM | POA: Diagnosis not present

## 2023-12-08 DIAGNOSIS — Z79899 Other long term (current) drug therapy: Secondary | ICD-10-CM | POA: Diagnosis not present

## 2023-12-08 DIAGNOSIS — J4489 Other specified chronic obstructive pulmonary disease: Secondary | ICD-10-CM | POA: Diagnosis not present

## 2023-12-08 DIAGNOSIS — M109 Gout, unspecified: Secondary | ICD-10-CM | POA: Diagnosis not present

## 2023-12-08 DIAGNOSIS — Z791 Long term (current) use of non-steroidal anti-inflammatories (NSAID): Secondary | ICD-10-CM | POA: Diagnosis not present

## 2023-12-08 DIAGNOSIS — K5900 Constipation, unspecified: Secondary | ICD-10-CM | POA: Diagnosis not present

## 2023-12-08 DIAGNOSIS — N189 Chronic kidney disease, unspecified: Secondary | ICD-10-CM | POA: Diagnosis not present

## 2023-12-08 DIAGNOSIS — R1310 Dysphagia, unspecified: Secondary | ICD-10-CM | POA: Diagnosis not present

## 2023-12-08 DIAGNOSIS — R1312 Dysphagia, oropharyngeal phase: Secondary | ICD-10-CM | POA: Diagnosis not present

## 2023-12-08 DIAGNOSIS — Z7951 Long term (current) use of inhaled steroids: Secondary | ICD-10-CM | POA: Diagnosis not present

## 2023-12-08 DIAGNOSIS — G250 Essential tremor: Secondary | ICD-10-CM | POA: Diagnosis not present

## 2023-12-08 DIAGNOSIS — E43 Unspecified severe protein-calorie malnutrition: Secondary | ICD-10-CM | POA: Diagnosis not present

## 2023-12-08 DIAGNOSIS — M8580 Other specified disorders of bone density and structure, unspecified site: Secondary | ICD-10-CM | POA: Diagnosis not present

## 2023-12-08 DIAGNOSIS — D631 Anemia in chronic kidney disease: Secondary | ICD-10-CM | POA: Diagnosis not present

## 2023-12-08 DIAGNOSIS — I129 Hypertensive chronic kidney disease with stage 1 through stage 4 chronic kidney disease, or unspecified chronic kidney disease: Secondary | ICD-10-CM | POA: Diagnosis not present

## 2023-12-08 DIAGNOSIS — I2510 Atherosclerotic heart disease of native coronary artery without angina pectoris: Secondary | ICD-10-CM | POA: Diagnosis not present

## 2023-12-08 DIAGNOSIS — Z955 Presence of coronary angioplasty implant and graft: Secondary | ICD-10-CM | POA: Diagnosis not present

## 2023-12-08 DIAGNOSIS — K219 Gastro-esophageal reflux disease without esophagitis: Secondary | ICD-10-CM | POA: Diagnosis not present

## 2023-12-08 DIAGNOSIS — H903 Sensorineural hearing loss, bilateral: Secondary | ICD-10-CM | POA: Diagnosis not present

## 2023-12-08 DIAGNOSIS — K9423 Gastrostomy malfunction: Secondary | ICD-10-CM | POA: Diagnosis not present

## 2023-12-08 DIAGNOSIS — E785 Hyperlipidemia, unspecified: Secondary | ICD-10-CM | POA: Diagnosis not present

## 2023-12-09 DIAGNOSIS — R1310 Dysphagia, unspecified: Secondary | ICD-10-CM | POA: Diagnosis not present

## 2023-12-10 DIAGNOSIS — R1310 Dysphagia, unspecified: Secondary | ICD-10-CM | POA: Diagnosis not present

## 2023-12-11 ENCOUNTER — Other Ambulatory Visit (HOSPITAL_COMMUNITY): Payer: Self-pay | Admitting: Surgery

## 2023-12-11 DIAGNOSIS — R1310 Dysphagia, unspecified: Secondary | ICD-10-CM | POA: Diagnosis not present

## 2023-12-11 DIAGNOSIS — Z4659 Encounter for fitting and adjustment of other gastrointestinal appliance and device: Secondary | ICD-10-CM

## 2023-12-12 DIAGNOSIS — M8580 Other specified disorders of bone density and structure, unspecified site: Secondary | ICD-10-CM | POA: Diagnosis not present

## 2023-12-12 DIAGNOSIS — Z7951 Long term (current) use of inhaled steroids: Secondary | ICD-10-CM | POA: Diagnosis not present

## 2023-12-12 DIAGNOSIS — K219 Gastro-esophageal reflux disease without esophagitis: Secondary | ICD-10-CM | POA: Diagnosis not present

## 2023-12-12 DIAGNOSIS — E785 Hyperlipidemia, unspecified: Secondary | ICD-10-CM | POA: Diagnosis not present

## 2023-12-12 DIAGNOSIS — Z7982 Long term (current) use of aspirin: Secondary | ICD-10-CM | POA: Diagnosis not present

## 2023-12-12 DIAGNOSIS — E43 Unspecified severe protein-calorie malnutrition: Secondary | ICD-10-CM | POA: Diagnosis not present

## 2023-12-12 DIAGNOSIS — N189 Chronic kidney disease, unspecified: Secondary | ICD-10-CM | POA: Diagnosis not present

## 2023-12-12 DIAGNOSIS — K9423 Gastrostomy malfunction: Secondary | ICD-10-CM | POA: Diagnosis not present

## 2023-12-12 DIAGNOSIS — K5900 Constipation, unspecified: Secondary | ICD-10-CM | POA: Diagnosis not present

## 2023-12-12 DIAGNOSIS — Z955 Presence of coronary angioplasty implant and graft: Secondary | ICD-10-CM | POA: Diagnosis not present

## 2023-12-12 DIAGNOSIS — Z791 Long term (current) use of non-steroidal anti-inflammatories (NSAID): Secondary | ICD-10-CM | POA: Diagnosis not present

## 2023-12-12 DIAGNOSIS — J4489 Other specified chronic obstructive pulmonary disease: Secondary | ICD-10-CM | POA: Diagnosis not present

## 2023-12-12 DIAGNOSIS — Z79899 Other long term (current) drug therapy: Secondary | ICD-10-CM | POA: Diagnosis not present

## 2023-12-12 DIAGNOSIS — Q2549 Other congenital malformations of aorta: Secondary | ICD-10-CM | POA: Diagnosis not present

## 2023-12-12 DIAGNOSIS — I2510 Atherosclerotic heart disease of native coronary artery without angina pectoris: Secondary | ICD-10-CM | POA: Diagnosis not present

## 2023-12-12 DIAGNOSIS — M109 Gout, unspecified: Secondary | ICD-10-CM | POA: Diagnosis not present

## 2023-12-12 DIAGNOSIS — R1310 Dysphagia, unspecified: Secondary | ICD-10-CM | POA: Diagnosis not present

## 2023-12-12 DIAGNOSIS — D631 Anemia in chronic kidney disease: Secondary | ICD-10-CM | POA: Diagnosis not present

## 2023-12-12 DIAGNOSIS — I129 Hypertensive chronic kidney disease with stage 1 through stage 4 chronic kidney disease, or unspecified chronic kidney disease: Secondary | ICD-10-CM | POA: Diagnosis not present

## 2023-12-12 DIAGNOSIS — R1312 Dysphagia, oropharyngeal phase: Secondary | ICD-10-CM | POA: Diagnosis not present

## 2023-12-12 DIAGNOSIS — H903 Sensorineural hearing loss, bilateral: Secondary | ICD-10-CM | POA: Diagnosis not present

## 2023-12-12 DIAGNOSIS — G250 Essential tremor: Secondary | ICD-10-CM | POA: Diagnosis not present

## 2023-12-13 ENCOUNTER — Emergency Department (HOSPITAL_COMMUNITY): Payer: Medicare HMO

## 2023-12-13 ENCOUNTER — Encounter (HOSPITAL_COMMUNITY): Payer: Self-pay

## 2023-12-13 ENCOUNTER — Emergency Department (HOSPITAL_COMMUNITY)
Admission: EM | Admit: 2023-12-13 | Discharge: 2023-12-13 | Disposition: A | Discharge status: Home / Self Care | Payer: Medicare HMO | Attending: Emergency Medicine | Admitting: Emergency Medicine

## 2023-12-13 ENCOUNTER — Other Ambulatory Visit: Payer: Self-pay

## 2023-12-13 DIAGNOSIS — R058 Other specified cough: Secondary | ICD-10-CM | POA: Diagnosis not present

## 2023-12-13 DIAGNOSIS — R059 Cough, unspecified: Secondary | ICD-10-CM | POA: Insufficient documentation

## 2023-12-13 DIAGNOSIS — Z7982 Long term (current) use of aspirin: Secondary | ICD-10-CM | POA: Diagnosis not present

## 2023-12-13 DIAGNOSIS — Z95828 Presence of other vascular implants and grafts: Secondary | ICD-10-CM | POA: Diagnosis not present

## 2023-12-13 DIAGNOSIS — R9389 Abnormal findings on diagnostic imaging of other specified body structures: Secondary | ICD-10-CM | POA: Diagnosis not present

## 2023-12-13 DIAGNOSIS — Z931 Gastrostomy status: Secondary | ICD-10-CM | POA: Diagnosis not present

## 2023-12-13 DIAGNOSIS — K9423 Gastrostomy malfunction: Secondary | ICD-10-CM | POA: Diagnosis not present

## 2023-12-13 DIAGNOSIS — R300 Dysuria: Secondary | ICD-10-CM | POA: Diagnosis not present

## 2023-12-13 DIAGNOSIS — R1084 Generalized abdominal pain: Secondary | ICD-10-CM | POA: Diagnosis not present

## 2023-12-13 DIAGNOSIS — J9811 Atelectasis: Secondary | ICD-10-CM | POA: Diagnosis not present

## 2023-12-13 DIAGNOSIS — D72829 Elevated white blood cell count, unspecified: Secondary | ICD-10-CM | POA: Diagnosis not present

## 2023-12-13 DIAGNOSIS — R1310 Dysphagia, unspecified: Secondary | ICD-10-CM | POA: Diagnosis not present

## 2023-12-13 LAB — LIPASE, BLOOD: Lipase: 30 U/L (ref 11–51)

## 2023-12-13 LAB — CBC WITH DIFFERENTIAL/PLATELET
Abs Immature Granulocytes: 0.03 10*3/uL (ref 0.00–0.07)
Basophils Absolute: 0 10*3/uL (ref 0.0–0.1)
Basophils Relative: 0 %
Eosinophils Absolute: 0.1 10*3/uL (ref 0.0–0.5)
Eosinophils Relative: 0 %
HCT: 41.3 % (ref 39.0–52.0)
Hemoglobin: 13.3 g/dL (ref 13.0–17.0)
Immature Granulocytes: 0 %
Lymphocytes Relative: 11 %
Lymphs Abs: 1.2 10*3/uL (ref 0.7–4.0)
MCH: 31.1 pg (ref 26.0–34.0)
MCHC: 32.2 g/dL (ref 30.0–36.0)
MCV: 96.5 fL (ref 80.0–100.0)
Monocytes Absolute: 0.7 10*3/uL (ref 0.1–1.0)
Monocytes Relative: 7 %
Neutro Abs: 9.1 10*3/uL — ABNORMAL HIGH (ref 1.7–7.7)
Neutrophils Relative %: 82 %
Platelets: 201 10*3/uL (ref 150–400)
RBC: 4.28 MIL/uL (ref 4.22–5.81)
RDW: 15.6 % — ABNORMAL HIGH (ref 11.5–15.5)
WBC: 11.1 10*3/uL — ABNORMAL HIGH (ref 4.0–10.5)
nRBC: 0 % (ref 0.0–0.2)

## 2023-12-13 LAB — COMPREHENSIVE METABOLIC PANEL
ALT: 40 U/L (ref 0–44)
AST: 43 U/L — ABNORMAL HIGH (ref 15–41)
Albumin: 4.1 g/dL (ref 3.5–5.0)
Alkaline Phosphatase: 58 U/L (ref 38–126)
Anion gap: 10 (ref 5–15)
BUN: 26 mg/dL — ABNORMAL HIGH (ref 8–23)
CO2: 25 mmol/L (ref 22–32)
Calcium: 9.7 mg/dL (ref 8.9–10.3)
Chloride: 99 mmol/L (ref 98–111)
Creatinine, Ser: 0.97 mg/dL (ref 0.61–1.24)
GFR, Estimated: >60 mL/min (ref >60)
Glucose, Bld: 105 mg/dL — ABNORMAL HIGH (ref 70–99)
Potassium: 4.3 mmol/L (ref 3.5–5.1)
Sodium: 134 mmol/L — ABNORMAL LOW (ref 135–145)
Total Bilirubin: 1.1 mg/dL (ref 0.0–1.2)
Total Protein: 7.7 g/dL (ref 6.5–8.1)

## 2023-12-13 MED ORDER — DIATRIZOATE MEGLUMINE & SODIUM 66-10 % PO SOLN
30.0000 mL | Freq: Once | ORAL | Status: AC
  Administered 2023-12-13: 30 mL
  Filled 2023-12-13: qty 30

## 2023-12-13 NOTE — ED Provider Notes (Signed)
 [place] EMERGENCY DEPARTMENT AT [facility] Provider Note   CSN: [id number] Arrival date & time: 12/13/23  1010     History  Chief Complaint  Patient presents with   feeding tube    Brian Dunn is a 83 y.o. male.  He has a history of coronary disease dysphagia aspiration and currently receives nutrition and medications via G-tube.  He is post to get the G-tube replaced next week but for the last few days it has been harder to flush and has not worked since this morning.  He has had some generalized abdominal pain going on for a few weeks.  Bowels have been soft.  A little bit of dysuria.  No fevers or chills.  Also has a cough but has that chronically.  The history is provided by the patient and the spouse.  Abdominal Pain Pain location:  Generalized Pain quality: aching   Pain radiates to:  Does not radiate Pain severity:  Moderate Onset quality:  Gradual Timing:  Intermittent Progression:  Unchanged Chronicity:  New Relieved by:  None tried Worsened by:  Nothing Ineffective treatments:  None tried Associated symptoms: cough and dysuria   Associated symptoms: no chest pain, no constipation, no fever, no nausea, no shortness of breath and no vomiting        Home Medications Prior to Admission medications   Medication Sig Start Date End Date Taking? Authorizing Provider  alfuzosin (UROXATRAL) 10 MG 24 hr tablet Take 10 mg by mouth daily with breakfast. 09/11/23   [provider]  allopurinol  (ZYLOPRIM ) 100 MG tablet Place 100 mg into feeding tube daily. 09/13/13   [provider]  amLODipine  (NORVASC ) 5 MG tablet Place 1 tablet (5 mg total) into feeding tube daily as directed. 10/04/23   Elgergawy, Brayton RAMAN, MD  aspirin  81 MG chewable tablet Place 81 mg into feeding tube daily.    [provider]  budesonide -formoterol  (SYMBICORT ) 160-4.5 MCG/ACT inhaler Inhale 2 puffs into the lungs daily.    [provider]  celecoxib   (CELEBREX ) 200 MG capsule Place 200 mg into feeding tube daily.    [provider]  colchicine  0.6 MG tablet Take 0.5 tablets (0.3 mg total) by mouth daily. 10/04/23   Elgergawy, Brayton RAMAN, MD  Cyanocobalamin  (VITAMIN B12 PO) Give 5 mg by tube daily.    [provider]  dicyclomine (BENTYL) 10 MG/5ML solution Place 20 mg into feeding tube See admin instructions. Give 10mL via G-tube 3 times a day as needed for abdominal cramping. 06/13/23   [provider]  docusate (COLACE) 50 MG/5ML liquid Place 10 mLs (100 mg total) into feeding tube daily. 03/25/23   Rhyne, Samantha J, PA-C  famotidine  (PEPCID ) 40 MG tablet Place 1 tablet (40 mg total) into feeding tube daily. 03/25/23   Rhyne, Samantha J, PA-C  Ferrous Sulfate (IRON) 325 (65 Fe) MG TABS 65 mg by Feeding Tube route daily.    [provider]  finasteride  (PROSCAR ) 5 MG tablet 5 mg. Place 1 tablet into the feeding tube daily 08/19/23   [provider]  fluticasone  (FLONASE ) 50 MCG/ACT nasal spray Place 2 sprays into the nose daily. 12/01/10   [provider]  gentamicin  cream (GARAMYCIN ) 0.1 % Apply 1 Application topically 3 (three) times daily.    [provider]  loratadine  (CLARITIN ) 10 MG tablet Place 1 tablet (10 mg total) into feeding tube daily. 03/24/23   Rhyne, Samantha J, PA-C  MAGNESIUM  PO 30 mLs by  Feeding Tube route daily.    [provider]  mirtazapine  (REMERON ) 15 MG tablet Place 15 mg into feeding tube at bedtime.    [provider]  Multiple Vitamin (MULTIVITAMIN) capsule Place 1 capsule into feeding tube daily. 03/24/23   Rhyne, Samantha J, PA-C  nitroGLYCERIN  (NITROSTAT ) 0.4 MG SL tablet Place 1 tablet (0.4 mg total) under the tongue every 5 (five) minutes as needed for chest pain. 01/29/22   Revankar, Jennifer SAUNDERS, MD  Nutritional Supplements (FEEDING SUPPLEMENT, OSMOLITE 1.5 CAL,) LIQD Place 237 mLs into feeding tube 5 (five) times daily.    [provider]  oxyCODONE -acetaminophen  (PERCOCET/ROXICET) 5-325 MG tablet Take 1 tablet by mouth every 6 (six) hours as needed for severe pain (pain score 7-10). 10/10/23   Doretha Folks, MD  pravastatin  (PRAVACHOL ) 40 MG tablet Take 1 tablet (40 mg total) by mouth daily. Patient taking differently: Place 40 mg into feeding tube daily. 04/01/23   Revankar, Jennifer SAUNDERS, MD  primidone  (MYSOLINE ) 50 MG tablet Place 1 tablet (50 mg total) into feeding tube at bedtime. 03/24/23   Rhyne, Samantha J, PA-C  senna-docusate (SENOKOT-S) 8.6-50 MG tablet Place 1 tablet into feeding tube at bedtime as needed for mild constipation. 03/24/23   Rhyne, Samantha J, PA-C  traMADol  (ULTRAM ) 50 MG tablet Place 1 tablet (50 mg total) into feeding tube every 6 (six) hours as needed for moderate pain. 03/24/23   Rhyne, Samantha J, PA-C      Allergies    Metoprolol  and Morphine    Review of Systems   Review of Systems  Constitutional:  Negative for fever.  Respiratory:  Positive for cough. Negative for shortness of breath.   Cardiovascular:  Negative for chest pain.  Gastrointestinal:  Positive for abdominal pain. Negative for constipation, nausea and vomiting.  Genitourinary:  Positive for dysuria.    Physical Exam Updated Vital Signs BP (!) 122/91 (BP Location: Right Arm)   Pulse 83   Temp 98.3 F (36.8 C)   Resp 18   Ht 5' 10 (1.778 m)   Wt 59.9 kg   SpO2 99%   BMI 18.94 kg/m  Physical Exam Vitals and nursing note reviewed.  Constitutional:      General: He is not in acute distress.    Appearance: Normal appearance. He is well-developed.  HENT:     Head: Normocephalic and atraumatic.  Eyes:     Conjunctiva/sclera: Conjunctivae normal.  Cardiovascular:     Rate and Rhythm: Normal rate and regular rhythm.     Heart sounds: No murmur heard. Pulmonary:     Effort: Pulmonary effort is normal. No respiratory distress.     Breath sounds: Normal breath sounds.  Abdominal:     Palpations: Abdomen is  soft.     Tenderness: There is no abdominal tenderness. There is no guarding or rebound.     Comments: PEG tube in place with minimal erythema at the tube site.  No significant drainage.  Musculoskeletal:        General: No deformity.     Cervical back: Neck supple.  Skin:    General: Skin is warm and dry.     Capillary Refill: Capillary refill takes less than 2 seconds.  Neurological:     General: No focal deficit present.     Mental Status: He is alert.     ED Results / Procedures / Treatments   Labs (all labs ordered are listed, but only abnormal results are displayed) Labs Reviewed  COMPREHENSIVE METABOLIC PANEL - Abnormal; Notable for the following components:      Result Value   Sodium 134 (*)    Glucose, Bld 105 (*)    BUN 26 (*)    AST 43 (*)    All other components within normal limits  CBC WITH DIFFERENTIAL/PLATELET - Abnormal; Notable for the following components:   WBC 11.1 (*)    RDW 15.6 (*)    Neutro Abs 9.1 (*)    All other components within normal limits  LIPASE, BLOOD  URINALYSIS, ROUTINE W REFLEX MICROSCOPIC    EKG None  Radiology DG Chest 1 View Result Date: 12/13/2023 CLINICAL DATA:  Productive cough for 1 week EXAM: CHEST  1 VIEW COMPARISON:  10/01/2023 chest radiograph. FINDINGS: Aortic arch and proximal descending thoracic aortic stent graft is stable. Stable cardiomediastinal silhouette with normal heart size. No pneumothorax (right-sided skin fold on the initial radiograph is seen to resolve on the repeat radiograph). No pleural effusion. Chronic mild-to-moderate elevation of the right hemidiaphragm. No pulmonary edema. Streaky mild scarring or atelectasis at the right costophrenic angle. No consolidative airspace disease. IMPRESSION: Chronic mild-to-moderate elevation of the right hemidiaphragm. Streaky mild scarring or atelectasis at the right costophrenic angle. Otherwise no active cardiopulmonary disease. Electronically Signed   By: Selinda DELENA Blue  M.D.   On: 12/13/2023 14:54   DG ABDOMEN PEG TUBE LOCATION Result Date: 12/13/2023 CLINICAL DATA:  [id number] PEG (percutaneous endoscopic gastrostomy) adjustment/replacement/removal (HCC) [id number] EXAM: ABDOMEN - 1 VIEW COMPARISON:  10/01/2023 abdominal radiograph FINDINGS: Percutaneous gastrostomy tube terminates over the region of the body of the stomach. Injected contrast is seen throughout the lumen of the nondistended stomach and descending and transverse duodenum. No evidence of extraluminal contrast. No dilated small bowel loops. Mild colonic stool. No evidence of pneumatosis or pneumoperitoneum. Chronic elevation of the right hemidiaphragm with right basilar atelectasis. Partially visualized screw overlying the right hip. IMPRESSION: 1. Percutaneous gastrostomy tube terminates over the region of the body of the stomach and appears well positioned with no evidence of extraluminal contrast. 2. Nonobstructive bowel gas pattern. Electronically Signed   By: Selinda DELENA Blue M.D.   On: 12/13/2023 14:49    Procedures FEEDING TUBE REPLACEMENT  Date/Time: 12/13/2023 12:58 PM  Performed by: Towana Ozell BROCKS, MD Authorized by: Towana Ozell BROCKS, MD  Consent: Verbal consent obtained. Consent given by: patient Patient understanding: patient states understanding of the procedure being performed Required items: required blood products, implants, devices, and special equipment available Patient identity confirmed: verbally with patient Preparation: Patient was prepped and draped in the usual sterile fashion. Indications: tube blocked Local anesthesia used: no  Anesthesia: Local anesthesia used: no  Sedation: Patient sedated: no  Tube type: gastrostomy Patient position: supine Tube size: 28. Bulb inflation fluid: normal saline Placement/position confirmation: x-ray Patient tolerance: patient tolerated the procedure well with no immediate complications       Medications Ordered in ED Medications -  No data to display  ED Course/ Medical Decision Making/ A&P Clinical Course as of 12/13/23 1731  Sat Dec 13, 2023  1350 X-ray of chest showing possible pneumothorax on right.  X-ray abdomen showing likely intraluminal tube placement with contrast [MB]  1503 Reading of chest x-ray does not show any pneumo on reading from radiologist.  Abdomen shows tube in appropriate position.  I reviewed this with patient and his wife.  I asked him if they wanted to proceed any type of workup on his abdominal pain with getting a CAT scan and  they declined at this time. [MB]    Clinical Course User Index [MB] Towana Ozell BROCKS, MD                                 Medical Decision Making Amount and/or Complexity of Data Reviewed Labs: ordered. Radiology: ordered.  Risk Prescription drug management.   This patient complains of poor function of his feeding tube, generalized abdominal pain; this involves an extensive number of treatment Options and is a complaint that carries with it a high risk of complications and morbidity. The differential includes feeding tube malfunction, feeding tube malposition, constipation, obstruction, perforation, abscess  I ordered, reviewed and interpreted labs, which included CBC with elevated white count, chemistries and LFTs fairly unremarkable I ordered imaging studies which included chest x-ray and KUB and I independently    visualized and interpreted imaging which showed no acute findings, good position of tube Additional history obtained from patient's wife Previous records obtained and reviewed in epic including recent IR replacement note Cardiac monitoring reviewed, sinus rhythm Social determinants considered, no significant barriers Critical Interventions: None  After the interventions stated above, I reevaluated the patient and found patient to be feeling improved with tube replacement Admission and further testing considered, I offered him further workup for  his abdominal pain including CAT scan.  He is declining at this time and feels comfortable following up with his outpatient treatment team.  Return instructions discussed.         Final Clinical Impression(s) / ED Diagnoses Final diagnoses:  Gastrostomy tube obstruction [facility])    Rx / DC Orders ED Discharge Orders     None         Towana Ozell BROCKS, MD 12/13/23 1733

## 2023-12-13 NOTE — ED Notes (Signed)
 Pt family shares that he has had a cough for a week, though family notes she has not heard cough until today. They would like this addressed in today's visit. Productive cough. No known sick contacts.

## 2023-12-13 NOTE — ED Triage Notes (Signed)
 Reports his feeding tube has been dislodge for about a week.   Appears in place.

## 2023-12-13 NOTE — ED Notes (Signed)
 Pt d/c home with visitor per order. Discharge summary reviewed, verbalize understanding. Off unit via WC. NAD

## 2023-12-13 NOTE — Discharge Instructions (Signed)
 You were seen in the emergency department for a nonfunctioning G-tube.  The G-tube was replaced and appears to be in good working position.  Please follow-up with your treatment team as scheduled.  Return if any worsening or concerning symptoms

## 2023-12-14 DIAGNOSIS — R1310 Dysphagia, unspecified: Secondary | ICD-10-CM | POA: Diagnosis not present

## 2023-12-15 DIAGNOSIS — R1310 Dysphagia, unspecified: Secondary | ICD-10-CM | POA: Diagnosis not present

## 2023-12-16 DIAGNOSIS — Z7982 Long term (current) use of aspirin: Secondary | ICD-10-CM | POA: Diagnosis not present

## 2023-12-16 DIAGNOSIS — Z79899 Other long term (current) drug therapy: Secondary | ICD-10-CM | POA: Diagnosis not present

## 2023-12-16 DIAGNOSIS — K9423 Gastrostomy malfunction: Secondary | ICD-10-CM | POA: Diagnosis not present

## 2023-12-16 DIAGNOSIS — Z7951 Long term (current) use of inhaled steroids: Secondary | ICD-10-CM | POA: Diagnosis not present

## 2023-12-16 DIAGNOSIS — I2510 Atherosclerotic heart disease of native coronary artery without angina pectoris: Secondary | ICD-10-CM | POA: Diagnosis not present

## 2023-12-16 DIAGNOSIS — N189 Chronic kidney disease, unspecified: Secondary | ICD-10-CM | POA: Diagnosis not present

## 2023-12-16 DIAGNOSIS — E43 Unspecified severe protein-calorie malnutrition: Secondary | ICD-10-CM | POA: Diagnosis not present

## 2023-12-16 DIAGNOSIS — M8580 Other specified disorders of bone density and structure, unspecified site: Secondary | ICD-10-CM | POA: Diagnosis not present

## 2023-12-16 DIAGNOSIS — Q2549 Other congenital malformations of aorta: Secondary | ICD-10-CM | POA: Diagnosis not present

## 2023-12-16 DIAGNOSIS — R1312 Dysphagia, oropharyngeal phase: Secondary | ICD-10-CM | POA: Diagnosis not present

## 2023-12-16 DIAGNOSIS — J4489 Other specified chronic obstructive pulmonary disease: Secondary | ICD-10-CM | POA: Diagnosis not present

## 2023-12-16 DIAGNOSIS — E785 Hyperlipidemia, unspecified: Secondary | ICD-10-CM | POA: Diagnosis not present

## 2023-12-16 DIAGNOSIS — M109 Gout, unspecified: Secondary | ICD-10-CM | POA: Diagnosis not present

## 2023-12-16 DIAGNOSIS — R1310 Dysphagia, unspecified: Secondary | ICD-10-CM | POA: Diagnosis not present

## 2023-12-16 DIAGNOSIS — Z791 Long term (current) use of non-steroidal anti-inflammatories (NSAID): Secondary | ICD-10-CM | POA: Diagnosis not present

## 2023-12-16 DIAGNOSIS — K219 Gastro-esophageal reflux disease without esophagitis: Secondary | ICD-10-CM | POA: Diagnosis not present

## 2023-12-16 DIAGNOSIS — G250 Essential tremor: Secondary | ICD-10-CM | POA: Diagnosis not present

## 2023-12-16 DIAGNOSIS — D631 Anemia in chronic kidney disease: Secondary | ICD-10-CM | POA: Diagnosis not present

## 2023-12-16 DIAGNOSIS — I129 Hypertensive chronic kidney disease with stage 1 through stage 4 chronic kidney disease, or unspecified chronic kidney disease: Secondary | ICD-10-CM | POA: Diagnosis not present

## 2023-12-16 DIAGNOSIS — Z955 Presence of coronary angioplasty implant and graft: Secondary | ICD-10-CM | POA: Diagnosis not present

## 2023-12-16 DIAGNOSIS — K5900 Constipation, unspecified: Secondary | ICD-10-CM | POA: Diagnosis not present

## 2023-12-16 DIAGNOSIS — H903 Sensorineural hearing loss, bilateral: Secondary | ICD-10-CM | POA: Diagnosis not present

## 2023-12-17 DIAGNOSIS — R1310 Dysphagia, unspecified: Secondary | ICD-10-CM | POA: Diagnosis not present

## 2023-12-18 DIAGNOSIS — Q2549 Other congenital malformations of aorta: Secondary | ICD-10-CM | POA: Diagnosis not present

## 2023-12-18 DIAGNOSIS — R1310 Dysphagia, unspecified: Secondary | ICD-10-CM | POA: Diagnosis not present

## 2023-12-18 DIAGNOSIS — K5900 Constipation, unspecified: Secondary | ICD-10-CM | POA: Diagnosis not present

## 2023-12-18 DIAGNOSIS — Z955 Presence of coronary angioplasty implant and graft: Secondary | ICD-10-CM | POA: Diagnosis not present

## 2023-12-18 DIAGNOSIS — K9423 Gastrostomy malfunction: Secondary | ICD-10-CM | POA: Diagnosis not present

## 2023-12-18 DIAGNOSIS — G250 Essential tremor: Secondary | ICD-10-CM | POA: Diagnosis not present

## 2023-12-18 DIAGNOSIS — I129 Hypertensive chronic kidney disease with stage 1 through stage 4 chronic kidney disease, or unspecified chronic kidney disease: Secondary | ICD-10-CM | POA: Diagnosis not present

## 2023-12-18 DIAGNOSIS — Z79899 Other long term (current) drug therapy: Secondary | ICD-10-CM | POA: Diagnosis not present

## 2023-12-18 DIAGNOSIS — E785 Hyperlipidemia, unspecified: Secondary | ICD-10-CM | POA: Diagnosis not present

## 2023-12-18 DIAGNOSIS — E43 Unspecified severe protein-calorie malnutrition: Secondary | ICD-10-CM | POA: Diagnosis not present

## 2023-12-18 DIAGNOSIS — N189 Chronic kidney disease, unspecified: Secondary | ICD-10-CM | POA: Diagnosis not present

## 2023-12-18 DIAGNOSIS — M109 Gout, unspecified: Secondary | ICD-10-CM | POA: Diagnosis not present

## 2023-12-18 DIAGNOSIS — D631 Anemia in chronic kidney disease: Secondary | ICD-10-CM | POA: Diagnosis not present

## 2023-12-18 DIAGNOSIS — Z7951 Long term (current) use of inhaled steroids: Secondary | ICD-10-CM | POA: Diagnosis not present

## 2023-12-18 DIAGNOSIS — Z791 Long term (current) use of non-steroidal anti-inflammatories (NSAID): Secondary | ICD-10-CM | POA: Diagnosis not present

## 2023-12-18 DIAGNOSIS — J4489 Other specified chronic obstructive pulmonary disease: Secondary | ICD-10-CM | POA: Diagnosis not present

## 2023-12-18 DIAGNOSIS — K219 Gastro-esophageal reflux disease without esophagitis: Secondary | ICD-10-CM | POA: Diagnosis not present

## 2023-12-18 DIAGNOSIS — R1312 Dysphagia, oropharyngeal phase: Secondary | ICD-10-CM | POA: Diagnosis not present

## 2023-12-18 DIAGNOSIS — I2510 Atherosclerotic heart disease of native coronary artery without angina pectoris: Secondary | ICD-10-CM | POA: Diagnosis not present

## 2023-12-18 DIAGNOSIS — Z7982 Long term (current) use of aspirin: Secondary | ICD-10-CM | POA: Diagnosis not present

## 2023-12-18 DIAGNOSIS — H903 Sensorineural hearing loss, bilateral: Secondary | ICD-10-CM | POA: Diagnosis not present

## 2023-12-18 DIAGNOSIS — M8580 Other specified disorders of bone density and structure, unspecified site: Secondary | ICD-10-CM | POA: Diagnosis not present

## 2023-12-19 ENCOUNTER — Other Ambulatory Visit (HOSPITAL_COMMUNITY): Payer: Self-pay | Admitting: Surgery

## 2023-12-19 ENCOUNTER — Ambulatory Visit (HOSPITAL_COMMUNITY)
Admission: RE | Admit: 2023-12-19 | Discharge: 2023-12-19 | Disposition: A | Discharge status: Home / Self Care | Payer: Medicare HMO | Source: Ambulatory Visit | Attending: Surgery | Admitting: Surgery

## 2023-12-19 DIAGNOSIS — R1310 Dysphagia, unspecified: Secondary | ICD-10-CM | POA: Diagnosis not present

## 2023-12-19 DIAGNOSIS — Z431 Encounter for attention to gastrostomy: Secondary | ICD-10-CM | POA: Diagnosis not present

## 2023-12-19 DIAGNOSIS — Z4659 Encounter for fitting and adjustment of other gastrointestinal appliance and device: Secondary | ICD-10-CM

## 2023-12-19 HISTORY — PX: IR REPLC GASTRO/COLONIC TUBE PERCUT W/FLUORO: IMG2333

## 2023-12-19 MED ORDER — LIDOCAINE VISCOUS HCL 2 % MT SOLN
OROMUCOSAL | Status: AC
  Filled 2023-12-19: qty 15

## 2023-12-19 NOTE — Progress Notes (Incomplete)
Patient arrived today for routine exchange of 41fr balloon retention gtube.  Exchanged tube and then patient stated he was told he could possibly get a clamp, took tube out and placed a clamp on tube and replaced tube, adding 10cc's of saline to balloon.  Patient states when he takes the cap off to administer anything the tube leaks profusely from the ports, after clamp was added, showed patient how it worked and patient was pleased with this setup to try and reduce leaking.    Theodoro Kos, [place]

## 2023-12-20 DIAGNOSIS — R1310 Dysphagia, unspecified: Secondary | ICD-10-CM | POA: Diagnosis not present

## 2023-12-21 DIAGNOSIS — R1310 Dysphagia, unspecified: Secondary | ICD-10-CM | POA: Diagnosis not present

## 2023-12-22 ENCOUNTER — Other Ambulatory Visit: Payer: Self-pay

## 2023-12-22 ENCOUNTER — Inpatient Hospital Stay: Payer: Medicare HMO | Attending: Oncology

## 2023-12-22 ENCOUNTER — Inpatient Hospital Stay: Payer: Medicare HMO

## 2023-12-22 DIAGNOSIS — D631 Anemia in chronic kidney disease: Secondary | ICD-10-CM

## 2023-12-22 DIAGNOSIS — N182 Chronic kidney disease, stage 2 (mild): Secondary | ICD-10-CM | POA: Diagnosis not present

## 2023-12-22 DIAGNOSIS — R1310 Dysphagia, unspecified: Secondary | ICD-10-CM | POA: Diagnosis not present

## 2023-12-22 LAB — CBC WITH DIFFERENTIAL (CANCER CENTER ONLY)
Abs Immature Granulocytes: 0.02 10*3/uL (ref 0.00–0.07)
Basophils Absolute: 0.1 10*3/uL (ref 0.0–0.1)
Basophils Relative: 1 %
Eosinophils Absolute: 0.3 10*3/uL (ref 0.0–0.5)
Eosinophils Relative: 4 %
HCT: 32.5 % — ABNORMAL LOW (ref 39.0–52.0)
Hemoglobin: 11.1 g/dL — ABNORMAL LOW (ref 13.0–17.0)
Immature Granulocytes: 0 %
Lymphocytes Relative: 17 %
Lymphs Abs: 1.1 10*3/uL (ref 0.7–4.0)
MCH: 31.4 pg (ref 26.0–34.0)
MCHC: 34.2 g/dL (ref 30.0–36.0)
MCV: 92.1 fL (ref 80.0–100.0)
Monocytes Absolute: 0.6 10*3/uL (ref 0.1–1.0)
Monocytes Relative: 9 %
Neutro Abs: 4.5 10*3/uL (ref 1.7–7.7)
Neutrophils Relative %: 69 %
Platelet Count: 185 10*3/uL (ref 150–400)
RBC: 3.53 MIL/uL — ABNORMAL LOW (ref 4.22–5.81)
RDW: 15.5 % (ref 11.5–15.5)
WBC Count: 6.5 10*3/uL (ref 4.0–10.5)
nRBC: 0 % (ref 0.0–0.2)
nRBC: 0 /100{WBCs}

## 2023-12-22 NOTE — Progress Notes (Unsigned)
HELD SHOT FOR HGB AT 11.1. PATIENT AWARE.

## 2023-12-23 DIAGNOSIS — J4489 Other specified chronic obstructive pulmonary disease: Secondary | ICD-10-CM | POA: Diagnosis not present

## 2023-12-23 DIAGNOSIS — Z955 Presence of coronary angioplasty implant and graft: Secondary | ICD-10-CM | POA: Diagnosis not present

## 2023-12-23 DIAGNOSIS — Z7982 Long term (current) use of aspirin: Secondary | ICD-10-CM | POA: Diagnosis not present

## 2023-12-23 DIAGNOSIS — Z79899 Other long term (current) drug therapy: Secondary | ICD-10-CM | POA: Diagnosis not present

## 2023-12-23 DIAGNOSIS — E43 Unspecified severe protein-calorie malnutrition: Secondary | ICD-10-CM | POA: Diagnosis not present

## 2023-12-23 DIAGNOSIS — Z791 Long term (current) use of non-steroidal anti-inflammatories (NSAID): Secondary | ICD-10-CM | POA: Diagnosis not present

## 2023-12-23 DIAGNOSIS — I129 Hypertensive chronic kidney disease with stage 1 through stage 4 chronic kidney disease, or unspecified chronic kidney disease: Secondary | ICD-10-CM | POA: Diagnosis not present

## 2023-12-23 DIAGNOSIS — R1312 Dysphagia, oropharyngeal phase: Secondary | ICD-10-CM | POA: Diagnosis not present

## 2023-12-23 DIAGNOSIS — D631 Anemia in chronic kidney disease: Secondary | ICD-10-CM | POA: Diagnosis not present

## 2023-12-23 DIAGNOSIS — Z7951 Long term (current) use of inhaled steroids: Secondary | ICD-10-CM | POA: Diagnosis not present

## 2023-12-23 DIAGNOSIS — I2510 Atherosclerotic heart disease of native coronary artery without angina pectoris: Secondary | ICD-10-CM | POA: Diagnosis not present

## 2023-12-23 DIAGNOSIS — K219 Gastro-esophageal reflux disease without esophagitis: Secondary | ICD-10-CM | POA: Diagnosis not present

## 2023-12-23 DIAGNOSIS — M8580 Other specified disorders of bone density and structure, unspecified site: Secondary | ICD-10-CM | POA: Diagnosis not present

## 2023-12-23 DIAGNOSIS — E785 Hyperlipidemia, unspecified: Secondary | ICD-10-CM | POA: Diagnosis not present

## 2023-12-23 DIAGNOSIS — K9423 Gastrostomy malfunction: Secondary | ICD-10-CM | POA: Diagnosis not present

## 2023-12-23 DIAGNOSIS — M109 Gout, unspecified: Secondary | ICD-10-CM | POA: Diagnosis not present

## 2023-12-23 DIAGNOSIS — K5900 Constipation, unspecified: Secondary | ICD-10-CM | POA: Diagnosis not present

## 2023-12-23 DIAGNOSIS — Q2549 Other congenital malformations of aorta: Secondary | ICD-10-CM | POA: Diagnosis not present

## 2023-12-23 DIAGNOSIS — N189 Chronic kidney disease, unspecified: Secondary | ICD-10-CM | POA: Diagnosis not present

## 2023-12-23 DIAGNOSIS — H903 Sensorineural hearing loss, bilateral: Secondary | ICD-10-CM | POA: Diagnosis not present

## 2023-12-23 DIAGNOSIS — R1310 Dysphagia, unspecified: Secondary | ICD-10-CM | POA: Diagnosis not present

## 2023-12-23 DIAGNOSIS — G250 Essential tremor: Secondary | ICD-10-CM | POA: Diagnosis not present

## 2023-12-24 ENCOUNTER — Other Ambulatory Visit: Payer: Self-pay | Admitting: Cardiology

## 2023-12-24 DIAGNOSIS — R1310 Dysphagia, unspecified: Secondary | ICD-10-CM | POA: Diagnosis not present

## 2023-12-25 DIAGNOSIS — R1310 Dysphagia, unspecified: Secondary | ICD-10-CM | POA: Diagnosis not present

## 2023-12-26 DIAGNOSIS — R1310 Dysphagia, unspecified: Secondary | ICD-10-CM | POA: Diagnosis not present

## 2023-12-27 DIAGNOSIS — R1310 Dysphagia, unspecified: Secondary | ICD-10-CM | POA: Diagnosis not present

## 2023-12-28 DIAGNOSIS — R1310 Dysphagia, unspecified: Secondary | ICD-10-CM | POA: Diagnosis not present

## 2023-12-29 DIAGNOSIS — E43 Unspecified severe protein-calorie malnutrition: Secondary | ICD-10-CM | POA: Diagnosis not present

## 2023-12-29 DIAGNOSIS — Z7951 Long term (current) use of inhaled steroids: Secondary | ICD-10-CM | POA: Diagnosis not present

## 2023-12-29 DIAGNOSIS — I2510 Atherosclerotic heart disease of native coronary artery without angina pectoris: Secondary | ICD-10-CM | POA: Diagnosis not present

## 2023-12-29 DIAGNOSIS — K219 Gastro-esophageal reflux disease without esophagitis: Secondary | ICD-10-CM | POA: Diagnosis not present

## 2023-12-29 DIAGNOSIS — E785 Hyperlipidemia, unspecified: Secondary | ICD-10-CM | POA: Diagnosis not present

## 2023-12-29 DIAGNOSIS — J4489 Other specified chronic obstructive pulmonary disease: Secondary | ICD-10-CM | POA: Diagnosis not present

## 2023-12-29 DIAGNOSIS — K9423 Gastrostomy malfunction: Secondary | ICD-10-CM | POA: Diagnosis not present

## 2023-12-29 DIAGNOSIS — Z791 Long term (current) use of non-steroidal anti-inflammatories (NSAID): Secondary | ICD-10-CM | POA: Diagnosis not present

## 2023-12-29 DIAGNOSIS — G250 Essential tremor: Secondary | ICD-10-CM | POA: Diagnosis not present

## 2023-12-29 DIAGNOSIS — Z955 Presence of coronary angioplasty implant and graft: Secondary | ICD-10-CM | POA: Diagnosis not present

## 2023-12-29 DIAGNOSIS — D631 Anemia in chronic kidney disease: Secondary | ICD-10-CM | POA: Diagnosis not present

## 2023-12-29 DIAGNOSIS — H903 Sensorineural hearing loss, bilateral: Secondary | ICD-10-CM | POA: Diagnosis not present

## 2023-12-29 DIAGNOSIS — M8580 Other specified disorders of bone density and structure, unspecified site: Secondary | ICD-10-CM | POA: Diagnosis not present

## 2023-12-29 DIAGNOSIS — M109 Gout, unspecified: Secondary | ICD-10-CM | POA: Diagnosis not present

## 2023-12-29 DIAGNOSIS — R1312 Dysphagia, oropharyngeal phase: Secondary | ICD-10-CM | POA: Diagnosis not present

## 2023-12-29 DIAGNOSIS — Z79899 Other long term (current) drug therapy: Secondary | ICD-10-CM | POA: Diagnosis not present

## 2023-12-29 DIAGNOSIS — Q2549 Other congenital malformations of aorta: Secondary | ICD-10-CM | POA: Diagnosis not present

## 2023-12-29 DIAGNOSIS — R1310 Dysphagia, unspecified: Secondary | ICD-10-CM | POA: Diagnosis not present

## 2023-12-29 DIAGNOSIS — K5900 Constipation, unspecified: Secondary | ICD-10-CM | POA: Diagnosis not present

## 2023-12-29 DIAGNOSIS — I129 Hypertensive chronic kidney disease with stage 1 through stage 4 chronic kidney disease, or unspecified chronic kidney disease: Secondary | ICD-10-CM | POA: Diagnosis not present

## 2023-12-29 DIAGNOSIS — Z7982 Long term (current) use of aspirin: Secondary | ICD-10-CM | POA: Diagnosis not present

## 2023-12-29 DIAGNOSIS — N189 Chronic kidney disease, unspecified: Secondary | ICD-10-CM | POA: Diagnosis not present

## 2023-12-30 DIAGNOSIS — H903 Sensorineural hearing loss, bilateral: Secondary | ICD-10-CM | POA: Diagnosis not present

## 2023-12-30 DIAGNOSIS — Z955 Presence of coronary angioplasty implant and graft: Secondary | ICD-10-CM | POA: Diagnosis not present

## 2023-12-30 DIAGNOSIS — N189 Chronic kidney disease, unspecified: Secondary | ICD-10-CM | POA: Diagnosis not present

## 2023-12-30 DIAGNOSIS — Q2549 Other congenital malformations of aorta: Secondary | ICD-10-CM | POA: Diagnosis not present

## 2023-12-30 DIAGNOSIS — Z79899 Other long term (current) drug therapy: Secondary | ICD-10-CM | POA: Diagnosis not present

## 2023-12-30 DIAGNOSIS — M8580 Other specified disorders of bone density and structure, unspecified site: Secondary | ICD-10-CM | POA: Diagnosis not present

## 2023-12-30 DIAGNOSIS — K9423 Gastrostomy malfunction: Secondary | ICD-10-CM | POA: Diagnosis not present

## 2023-12-30 DIAGNOSIS — M109 Gout, unspecified: Secondary | ICD-10-CM | POA: Diagnosis not present

## 2023-12-30 DIAGNOSIS — I129 Hypertensive chronic kidney disease with stage 1 through stage 4 chronic kidney disease, or unspecified chronic kidney disease: Secondary | ICD-10-CM | POA: Diagnosis not present

## 2023-12-30 DIAGNOSIS — Z7951 Long term (current) use of inhaled steroids: Secondary | ICD-10-CM | POA: Diagnosis not present

## 2023-12-30 DIAGNOSIS — K219 Gastro-esophageal reflux disease without esophagitis: Secondary | ICD-10-CM | POA: Diagnosis not present

## 2023-12-30 DIAGNOSIS — Z7982 Long term (current) use of aspirin: Secondary | ICD-10-CM | POA: Diagnosis not present

## 2023-12-30 DIAGNOSIS — J4489 Other specified chronic obstructive pulmonary disease: Secondary | ICD-10-CM | POA: Diagnosis not present

## 2023-12-30 DIAGNOSIS — R1310 Dysphagia, unspecified: Secondary | ICD-10-CM | POA: Diagnosis not present

## 2023-12-30 DIAGNOSIS — Z791 Long term (current) use of non-steroidal anti-inflammatories (NSAID): Secondary | ICD-10-CM | POA: Diagnosis not present

## 2023-12-30 DIAGNOSIS — G250 Essential tremor: Secondary | ICD-10-CM | POA: Diagnosis not present

## 2023-12-30 DIAGNOSIS — E43 Unspecified severe protein-calorie malnutrition: Secondary | ICD-10-CM | POA: Diagnosis not present

## 2023-12-30 DIAGNOSIS — I2510 Atherosclerotic heart disease of native coronary artery without angina pectoris: Secondary | ICD-10-CM | POA: Diagnosis not present

## 2023-12-30 DIAGNOSIS — R1312 Dysphagia, oropharyngeal phase: Secondary | ICD-10-CM | POA: Diagnosis not present

## 2023-12-30 DIAGNOSIS — K5900 Constipation, unspecified: Secondary | ICD-10-CM | POA: Diagnosis not present

## 2023-12-30 DIAGNOSIS — E785 Hyperlipidemia, unspecified: Secondary | ICD-10-CM | POA: Diagnosis not present

## 2023-12-30 DIAGNOSIS — D631 Anemia in chronic kidney disease: Secondary | ICD-10-CM | POA: Diagnosis not present

## 2024-01-05 DIAGNOSIS — G250 Essential tremor: Secondary | ICD-10-CM | POA: Diagnosis not present

## 2024-01-05 DIAGNOSIS — K9423 Gastrostomy malfunction: Secondary | ICD-10-CM | POA: Diagnosis not present

## 2024-01-05 DIAGNOSIS — R1312 Dysphagia, oropharyngeal phase: Secondary | ICD-10-CM | POA: Diagnosis not present

## 2024-01-05 DIAGNOSIS — Z7951 Long term (current) use of inhaled steroids: Secondary | ICD-10-CM | POA: Diagnosis not present

## 2024-01-05 DIAGNOSIS — H903 Sensorineural hearing loss, bilateral: Secondary | ICD-10-CM | POA: Diagnosis not present

## 2024-01-05 DIAGNOSIS — I129 Hypertensive chronic kidney disease with stage 1 through stage 4 chronic kidney disease, or unspecified chronic kidney disease: Secondary | ICD-10-CM | POA: Diagnosis not present

## 2024-01-05 DIAGNOSIS — E785 Hyperlipidemia, unspecified: Secondary | ICD-10-CM | POA: Diagnosis not present

## 2024-01-05 DIAGNOSIS — Z791 Long term (current) use of non-steroidal anti-inflammatories (NSAID): Secondary | ICD-10-CM | POA: Diagnosis not present

## 2024-01-05 DIAGNOSIS — Z955 Presence of coronary angioplasty implant and graft: Secondary | ICD-10-CM | POA: Diagnosis not present

## 2024-01-05 DIAGNOSIS — N189 Chronic kidney disease, unspecified: Secondary | ICD-10-CM | POA: Diagnosis not present

## 2024-01-05 DIAGNOSIS — I2510 Atherosclerotic heart disease of native coronary artery without angina pectoris: Secondary | ICD-10-CM | POA: Diagnosis not present

## 2024-01-05 DIAGNOSIS — Q2549 Other congenital malformations of aorta: Secondary | ICD-10-CM | POA: Diagnosis not present

## 2024-01-05 DIAGNOSIS — M109 Gout, unspecified: Secondary | ICD-10-CM | POA: Diagnosis not present

## 2024-01-05 DIAGNOSIS — E43 Unspecified severe protein-calorie malnutrition: Secondary | ICD-10-CM | POA: Diagnosis not present

## 2024-01-05 DIAGNOSIS — K219 Gastro-esophageal reflux disease without esophagitis: Secondary | ICD-10-CM | POA: Diagnosis not present

## 2024-01-05 DIAGNOSIS — D631 Anemia in chronic kidney disease: Secondary | ICD-10-CM | POA: Diagnosis not present

## 2024-01-05 DIAGNOSIS — K5900 Constipation, unspecified: Secondary | ICD-10-CM | POA: Diagnosis not present

## 2024-01-05 DIAGNOSIS — Z7982 Long term (current) use of aspirin: Secondary | ICD-10-CM | POA: Diagnosis not present

## 2024-01-05 DIAGNOSIS — M8580 Other specified disorders of bone density and structure, unspecified site: Secondary | ICD-10-CM | POA: Diagnosis not present

## 2024-01-05 DIAGNOSIS — J4489 Other specified chronic obstructive pulmonary disease: Secondary | ICD-10-CM | POA: Diagnosis not present

## 2024-01-05 DIAGNOSIS — Z79899 Other long term (current) drug therapy: Secondary | ICD-10-CM | POA: Diagnosis not present

## 2024-01-08 ENCOUNTER — Encounter: Payer: Self-pay | Admitting: Vascular Surgery

## 2024-01-08 DIAGNOSIS — R1310 Dysphagia, unspecified: Secondary | ICD-10-CM | POA: Diagnosis not present

## 2024-01-12 ENCOUNTER — Telehealth (HOSPITAL_COMMUNITY): Payer: Self-pay

## 2024-01-12 DIAGNOSIS — K219 Gastro-esophageal reflux disease without esophagitis: Secondary | ICD-10-CM | POA: Diagnosis not present

## 2024-01-12 DIAGNOSIS — I129 Hypertensive chronic kidney disease with stage 1 through stage 4 chronic kidney disease, or unspecified chronic kidney disease: Secondary | ICD-10-CM | POA: Diagnosis not present

## 2024-01-12 DIAGNOSIS — R1312 Dysphagia, oropharyngeal phase: Secondary | ICD-10-CM | POA: Diagnosis not present

## 2024-01-12 DIAGNOSIS — H903 Sensorineural hearing loss, bilateral: Secondary | ICD-10-CM | POA: Diagnosis not present

## 2024-01-12 DIAGNOSIS — N189 Chronic kidney disease, unspecified: Secondary | ICD-10-CM | POA: Diagnosis not present

## 2024-01-12 DIAGNOSIS — K5900 Constipation, unspecified: Secondary | ICD-10-CM | POA: Diagnosis not present

## 2024-01-12 DIAGNOSIS — J4489 Other specified chronic obstructive pulmonary disease: Secondary | ICD-10-CM | POA: Diagnosis not present

## 2024-01-12 DIAGNOSIS — I2510 Atherosclerotic heart disease of native coronary artery without angina pectoris: Secondary | ICD-10-CM | POA: Diagnosis not present

## 2024-01-12 DIAGNOSIS — E785 Hyperlipidemia, unspecified: Secondary | ICD-10-CM | POA: Diagnosis not present

## 2024-01-12 DIAGNOSIS — Z7982 Long term (current) use of aspirin: Secondary | ICD-10-CM | POA: Diagnosis not present

## 2024-01-12 DIAGNOSIS — Z791 Long term (current) use of non-steroidal anti-inflammatories (NSAID): Secondary | ICD-10-CM | POA: Diagnosis not present

## 2024-01-12 DIAGNOSIS — M8580 Other specified disorders of bone density and structure, unspecified site: Secondary | ICD-10-CM | POA: Diagnosis not present

## 2024-01-12 DIAGNOSIS — M109 Gout, unspecified: Secondary | ICD-10-CM | POA: Diagnosis not present

## 2024-01-12 DIAGNOSIS — Q2549 Other congenital malformations of aorta: Secondary | ICD-10-CM | POA: Diagnosis not present

## 2024-01-12 DIAGNOSIS — D631 Anemia in chronic kidney disease: Secondary | ICD-10-CM | POA: Diagnosis not present

## 2024-01-12 DIAGNOSIS — G250 Essential tremor: Secondary | ICD-10-CM | POA: Diagnosis not present

## 2024-01-12 DIAGNOSIS — Z79899 Other long term (current) drug therapy: Secondary | ICD-10-CM | POA: Diagnosis not present

## 2024-01-12 DIAGNOSIS — E43 Unspecified severe protein-calorie malnutrition: Secondary | ICD-10-CM | POA: Diagnosis not present

## 2024-01-12 DIAGNOSIS — Z7951 Long term (current) use of inhaled steroids: Secondary | ICD-10-CM | POA: Diagnosis not present

## 2024-01-12 DIAGNOSIS — Z955 Presence of coronary angioplasty implant and graft: Secondary | ICD-10-CM | POA: Diagnosis not present

## 2024-01-12 DIAGNOSIS — K9423 Gastrostomy malfunction: Secondary | ICD-10-CM | POA: Diagnosis not present

## 2024-01-12 NOTE — Telephone Encounter (Signed)
 Returned pt's call regarding peg, no answer, left vm. AB

## 2024-01-13 ENCOUNTER — Ambulatory Visit (HOSPITAL_COMMUNITY)
Admission: RE | Admit: 2024-01-13 | Discharge: 2024-01-13 | Disposition: A | Discharge status: Home / Self Care | Source: Ambulatory Visit | Attending: Surgery | Admitting: Surgery

## 2024-01-13 DIAGNOSIS — Z4659 Encounter for fitting and adjustment of other gastrointestinal appliance and device: Secondary | ICD-10-CM

## 2024-01-13 DIAGNOSIS — Z431 Encounter for attention to gastrostomy: Secondary | ICD-10-CM | POA: Diagnosis not present

## 2024-01-13 HISTORY — PX: IR REPLC GASTRO/COLONIC TUBE PERCUT W/FLUORO: IMG2333

## 2024-01-13 NOTE — Progress Notes (Signed)
 Patient presented to IR department with complaint of pain, and leaking gastrostomy tube.  After assessment 75F Balloon retention g tube was replaced for a 64F balloon retention.  10mL Saline was placed in balloon port, and bumper was placed at 4.  Dressed with a split gauze, patient and spouse educated on importance of bumper location.  Patient given contact information for [facility] IR department.

## 2024-01-13 NOTE — Addendum Note (Signed)
 Encounter addended by: Edman Circle, RT on: 01/13/2024 8:38 AM  Actions taken: Imaging Exam ended

## 2024-01-19 ENCOUNTER — Other Ambulatory Visit: Payer: Self-pay

## 2024-01-19 DIAGNOSIS — Q2549 Other congenital malformations of aorta: Secondary | ICD-10-CM

## 2024-01-19 DIAGNOSIS — Z931 Gastrostomy status: Secondary | ICD-10-CM | POA: Diagnosis not present

## 2024-01-19 DIAGNOSIS — D638 Anemia in other chronic diseases classified elsewhere: Secondary | ICD-10-CM | POA: Diagnosis not present

## 2024-01-19 DIAGNOSIS — Z681 Body mass index (BMI) 19 or less, adult: Secondary | ICD-10-CM | POA: Diagnosis not present

## 2024-01-19 DIAGNOSIS — E43 Unspecified severe protein-calorie malnutrition: Secondary | ICD-10-CM | POA: Diagnosis not present

## 2024-01-19 DIAGNOSIS — K9423 Gastrostomy malfunction: Secondary | ICD-10-CM | POA: Diagnosis not present

## 2024-01-21 ENCOUNTER — Other Ambulatory Visit: Payer: Self-pay | Admitting: Oncology

## 2024-01-21 ENCOUNTER — Inpatient Hospital Stay: Payer: Medicare HMO

## 2024-01-21 ENCOUNTER — Inpatient Hospital Stay: Payer: Medicare HMO | Attending: Oncology | Admitting: Oncology

## 2024-01-21 ENCOUNTER — Telehealth: Payer: Self-pay | Admitting: Oncology

## 2024-01-21 VITALS — BP 137/68 | HR 71 | Temp 98.0°F | Resp 16 | Ht 70.0 in | Wt 132.9 lb

## 2024-01-21 DIAGNOSIS — D631 Anemia in chronic kidney disease: Secondary | ICD-10-CM

## 2024-01-21 DIAGNOSIS — Z992 Dependence on renal dialysis: Secondary | ICD-10-CM

## 2024-01-21 DIAGNOSIS — N182 Chronic kidney disease, stage 2 (mild): Secondary | ICD-10-CM | POA: Diagnosis present

## 2024-01-21 DIAGNOSIS — N186 End stage renal disease: Secondary | ICD-10-CM

## 2024-01-21 LAB — CMP (CANCER CENTER ONLY)
ALT: 29 U/L (ref 0–44)
AST: 31 U/L (ref 15–41)
Albumin: 4.1 g/dL (ref 3.5–5.0)
Alkaline Phosphatase: 96 U/L (ref 38–126)
Anion gap: 11 (ref 5–15)
BUN: 31 mg/dL — ABNORMAL HIGH (ref 8–23)
CO2: 27 mmol/L (ref 22–32)
Calcium: 10 mg/dL (ref 8.9–10.3)
Chloride: 96 mmol/L — ABNORMAL LOW (ref 98–111)
Creatinine: 0.85 mg/dL (ref 0.61–1.24)
GFR, Estimated: >60 mL/min (ref >60)
Glucose, Bld: 90 mg/dL (ref 70–99)
Potassium: 4.6 mmol/L (ref 3.5–5.1)
Sodium: 134 mmol/L — ABNORMAL LOW (ref 135–145)
Total Bilirubin: 0.3 mg/dL (ref 0.0–1.2)
Total Protein: 7.1 g/dL (ref 6.5–8.1)

## 2024-01-21 LAB — CBC WITH DIFFERENTIAL (CANCER CENTER ONLY)
Abs Immature Granulocytes: 0.02 10*3/uL (ref 0.00–0.07)
Basophils Absolute: 0.1 10*3/uL (ref 0.0–0.1)
Basophils Relative: 1 %
Eosinophils Absolute: 0.2 10*3/uL (ref 0.0–0.5)
Eosinophils Relative: 3 %
HCT: 32.7 % — ABNORMAL LOW (ref 39.0–52.0)
Hemoglobin: 11.1 g/dL — ABNORMAL LOW (ref 13.0–17.0)
Immature Granulocytes: 0 %
Lymphocytes Relative: 13 %
Lymphs Abs: 0.8 10*3/uL (ref 0.7–4.0)
MCH: 31.2 pg (ref 26.0–34.0)
MCHC: 33.9 g/dL (ref 30.0–36.0)
MCV: 91.9 fL (ref 80.0–100.0)
Monocytes Absolute: 0.5 10*3/uL (ref 0.1–1.0)
Monocytes Relative: 7 %
Neutro Abs: 5 10*3/uL (ref 1.7–7.7)
Neutrophils Relative %: 76 %
Platelet Count: 200 10*3/uL (ref 150–400)
RBC: 3.56 MIL/uL — ABNORMAL LOW (ref 4.22–5.81)
RDW: 16.4 % — ABNORMAL HIGH (ref 11.5–15.5)
WBC Count: 6.6 10*3/uL (ref 4.0–10.5)
nRBC: 0 % (ref 0.0–0.2)
nRBC: 0 /100{WBCs}

## 2024-01-21 LAB — VITAMIN B12: Vitamin B-12: 812 pg/mL (ref 180–914)

## 2024-01-21 LAB — IRON AND TIBC
Iron: 49 ug/dL (ref 45–182)
Saturation Ratios: 14 % — ABNORMAL LOW (ref 17.9–39.5)
TIBC: 364 ug/dL (ref 250–450)
UIBC: 315 ug/dL

## 2024-01-21 LAB — FERRITIN: Ferritin: 147 ng/mL (ref 24–336)

## 2024-01-21 LAB — FOLATE: Folate: 30 ng/mL (ref >5.9)

## 2024-01-21 NOTE — Telephone Encounter (Signed)
 Patient has been scheduled for follow-up visit per 01/21/24 LOS.  Pt given an appt calendar with date and time.

## 2024-01-21 NOTE — Progress Notes (Signed)
 [facility]  [address] [place],  [place]  [address] [phone]  Clinic Day:  01/21/2024  Referring physician: Lise Auer, MD   HISTORY OF PRESENT ILLNESS:  The patient is a 83 y.o. male with anemia secondary to mild renal insufficiency.  Over these past few months, the patient has been receiving monthly Aranesp injections, which led to his hemoglobin significantly rising above 10.  He comes in today for routine follow-up.  Since his last visit, the patient has been doing well.  From an anemia standpoint, he denies having any significant fatigue or any overt forms of blood loss.    PHYSICAL EXAM:  Blood pressure 137/68, pulse 71, temperature 98 F (36.7 C), temperature source Oral, resp. rate 16, height 5\' 10"  (1.778 m), weight 132 lb 14.4 oz (60.3 kg), SpO2 95%. Wt Readings from Last 3 Encounters:  01/21/24 132 lb 14.4 oz (60.3 kg)  12/13/23 132 lb (59.9 kg)  11/24/23 132 lb 8 oz (60.1 kg)   Body mass index is 19.07 kg/m. Performance status (ECOG): 2 - Symptomatic, <50% confined to bed Physical Exam Constitutional:      Appearance: Normal appearance. He is not ill-appearing.     Comments: Chronically-ill appearing gentleman who physically looks better versus previous visits.  HENT:     Mouth/Throat:     Mouth: Mucous membranes are moist.     Pharynx: Oropharynx is clear. No oropharyngeal exudate or posterior oropharyngeal erythema.  Cardiovascular:     Rate and Rhythm: Normal rate and regular rhythm.     Heart sounds: No murmur heard.    No friction rub. No gallop.  Pulmonary:     Effort: Pulmonary effort is normal. No respiratory distress.     Breath sounds: Normal breath sounds. No wheezing, rhonchi or rales.  Abdominal:     General: Bowel sounds are normal. There is no distension.     Palpations: Abdomen is soft. There is no mass.     Tenderness: There is no abdominal tenderness.  Musculoskeletal:        General: No swelling.      Right lower leg: No edema.     Left lower leg: No edema.  Lymphadenopathy:     Cervical: No cervical adenopathy.     Upper Body:     Right upper body: No supraclavicular or axillary adenopathy.     Left upper body: No supraclavicular or axillary adenopathy.     Lower Body: No right inguinal adenopathy. No left inguinal adenopathy.  Skin:    General: Skin is warm.     Coloration: Skin is not jaundiced.     Findings: No lesion or rash.  Neurological:     General: No focal deficit present.     Mental Status: He is alert and oriented to person, place, and time. Mental status is at baseline.  Psychiatric:        Mood and Affect: Mood normal.        Behavior: Behavior normal.        Thought Content: Thought content normal.   LABS:      Latest Ref Rng & Units 01/21/2024   10:19 AM 12/22/2023   10:29 AM 12/13/2023   11:30 AM  CBC  WBC 4.0 - 10.5 K/uL 6.6  6.5  11.1   Hemoglobin 13.0 - 17.0 g/dL 91.4  78.2  95.6   Hematocrit 39.0 - 52.0 % 32.7  32.5  41.3   Platelets 150 - 400  K/uL 200  185  201       Latest Ref Rng & Units 01/21/2024   10:19 AM 12/13/2023   11:30 AM 10/23/2023   10:30 AM  CMP  Glucose 70 - 99 mg/dL 90  161  94   BUN 8 - 23 mg/dL 31  26  39   Creatinine 0.61 - 1.24 mg/dL 0.96  0.45  4.09   Sodium 135 - 145 mmol/L 134  134  135   Potassium 3.5 - 5.1 mmol/L 4.6  4.3  4.6   Chloride 98 - 111 mmol/L 96  99  97   CO2 22 - 32 mmol/L 27  25  27    Calcium 8.9 - 10.3 mg/dL 81.1  9.7  9.9   Total Protein 6.5 - 8.1 g/dL 7.1  7.7  7.1   Total Bilirubin 0.0 - 1.2 mg/dL 0.3  1.1  0.3   Alkaline Phos 38 - 126 U/L 96  58  78   AST 15 - 41 U/L 31  43  29   ALT 0 - 44 U/L 29  40  27     Latest Reference Range & Units 01/21/24 10:18  Iron 45 - 182 ug/dL 49  UIBC ug/dL 914  TIBC 782 - 956 ug/dL 213  Saturation Ratios 17.9 - 39.5 % 14 (L)  Ferritin 24 - 336 ng/mL 147  Vitamin B12 180 - 914 pg/mL 812  (L): Data is abnormally low  ASSESSMENT & PLAN:  Assessment/Plan:  An  83 y.o. male with anemia secondary to a mild component of renal insufficiency.  I am pleased as this gentleman's hemoglobin has consistently been above 10 over these past few months, including today.  Based upon this, red cell shot therapy will continue to be held.  However, his hemoglobin will continue to be followed monthly to ensure it remains at/above 10.  I will see him back in 4 months for repeat clinical assessment.  The patient understands all the plans discussed today and is in agreement with them.    Teonna Coonan Kirby Funk, MD

## 2024-01-23 DIAGNOSIS — C61 Malignant neoplasm of prostate: Secondary | ICD-10-CM | POA: Diagnosis not present

## 2024-01-23 DIAGNOSIS — N3289 Other specified disorders of bladder: Secondary | ICD-10-CM | POA: Diagnosis not present

## 2024-01-27 ENCOUNTER — Other Ambulatory Visit (HOSPITAL_COMMUNITY): Payer: Self-pay | Admitting: Interventional Radiology

## 2024-01-27 DIAGNOSIS — Z4659 Encounter for fitting and adjustment of other gastrointestinal appliance and device: Secondary | ICD-10-CM

## 2024-01-28 ENCOUNTER — Ambulatory Visit (HOSPITAL_COMMUNITY)
Admission: RE | Admit: 2024-01-28 | Discharge: 2024-01-28 | Disposition: A | Discharge status: Home / Self Care | Source: Ambulatory Visit | Attending: Interventional Radiology | Admitting: Interventional Radiology

## 2024-01-28 DIAGNOSIS — K9423 Gastrostomy malfunction: Secondary | ICD-10-CM | POA: Diagnosis not present

## 2024-01-28 DIAGNOSIS — Z4659 Encounter for fitting and adjustment of other gastrointestinal appliance and device: Secondary | ICD-10-CM

## 2024-01-28 HISTORY — PX: IR REPLC GASTRO/COLONIC TUBE PERCUT W/FLUORO: IMG2333

## 2024-01-28 MED ORDER — IOHEXOL 300 MG/ML  SOLN
50.0000 mL | Freq: Once | INTRAMUSCULAR | Status: AC | PRN
  Administered 2024-01-28: 10 mL

## 2024-01-28 MED ORDER — LIDOCAINE VISCOUS HCL 2 % MT SOLN
OROMUCOSAL | Status: AC
Start: 2024-01-28 — End: ?
  Filled 2024-01-28: qty 15

## 2024-02-05 ENCOUNTER — Ambulatory Visit (INDEPENDENT_AMBULATORY_CARE_PROVIDER_SITE_OTHER)
Admission: RE | Admit: 2024-02-05 | Discharge: 2024-02-05 | Disposition: A | Discharge status: Home / Self Care | Source: Ambulatory Visit | Attending: Vascular Surgery | Admitting: Vascular Surgery

## 2024-02-05 DIAGNOSIS — Q2549 Other congenital malformations of aorta: Secondary | ICD-10-CM

## 2024-02-05 MED ORDER — IOHEXOL 350 MG/ML SOLN
100.0000 mL | Freq: Once | INTRAVENOUS | Status: AC | PRN
  Administered 2024-02-05: 75 mL via INTRAVENOUS

## 2024-02-18 ENCOUNTER — Inpatient Hospital Stay: Attending: Oncology

## 2024-02-18 DIAGNOSIS — N182 Chronic kidney disease, stage 2 (mild): Secondary | ICD-10-CM | POA: Insufficient documentation

## 2024-02-18 DIAGNOSIS — D631 Anemia in chronic kidney disease: Secondary | ICD-10-CM | POA: Insufficient documentation

## 2024-02-18 DIAGNOSIS — R1310 Dysphagia, unspecified: Secondary | ICD-10-CM | POA: Diagnosis not present

## 2024-02-18 LAB — CBC WITH DIFFERENTIAL (CANCER CENTER ONLY)
Abs Immature Granulocytes: 0.03 10*3/uL (ref 0.00–0.07)
Basophils Absolute: 0 10*3/uL (ref 0.0–0.1)
Basophils Relative: 1 %
Eosinophils Absolute: 0.2 10*3/uL (ref 0.0–0.5)
Eosinophils Relative: 4 %
HCT: 33.2 % — ABNORMAL LOW (ref 39.0–52.0)
Hemoglobin: 10.8 g/dL — ABNORMAL LOW (ref 13.0–17.0)
Immature Granulocytes: 1 %
Lymphocytes Relative: 17 %
Lymphs Abs: 1.1 10*3/uL (ref 0.7–4.0)
MCH: 30.2 pg (ref 26.0–34.0)
MCHC: 32.5 g/dL (ref 30.0–36.0)
MCV: 92.7 fL (ref 80.0–100.0)
Monocytes Absolute: 0.5 10*3/uL (ref 0.1–1.0)
Monocytes Relative: 7 %
Neutro Abs: 4.7 10*3/uL (ref 1.7–7.7)
Neutrophils Relative %: 70 %
Platelet Count: 213 10*3/uL (ref 150–400)
RBC: 3.58 MIL/uL — ABNORMAL LOW (ref 4.22–5.81)
RDW: 16.8 % — ABNORMAL HIGH (ref 11.5–15.5)
WBC Count: 6.6 10*3/uL (ref 4.0–10.5)
nRBC: 0 % (ref 0.0–0.2)
nRBC: 0 /100{WBCs}

## 2024-02-18 LAB — CMP (CANCER CENTER ONLY)
ALT: 32 U/L (ref 0–44)
AST: 36 U/L (ref 15–41)
Albumin: 4.2 g/dL (ref 3.5–5.0)
Alkaline Phosphatase: 101 U/L (ref 38–126)
Anion gap: 11 (ref 5–15)
BUN: 32 mg/dL — ABNORMAL HIGH (ref 8–23)
CO2: 26 mmol/L (ref 22–32)
Calcium: 10 mg/dL (ref 8.9–10.3)
Chloride: 96 mmol/L — ABNORMAL LOW (ref 98–111)
Creatinine: 0.87 mg/dL (ref 0.61–1.24)
GFR, Estimated: >60 mL/min (ref >60)
Glucose, Bld: 86 mg/dL (ref 70–99)
Potassium: 4.9 mmol/L (ref 3.5–5.1)
Sodium: 134 mmol/L — ABNORMAL LOW (ref 135–145)
Total Bilirubin: 0.3 mg/dL (ref 0.0–1.2)
Total Protein: 7.6 g/dL (ref 6.5–8.1)

## 2024-02-19 DIAGNOSIS — R1310 Dysphagia, unspecified: Secondary | ICD-10-CM | POA: Diagnosis not present

## 2024-02-20 DIAGNOSIS — R1310 Dysphagia, unspecified: Secondary | ICD-10-CM | POA: Diagnosis not present

## 2024-02-21 DIAGNOSIS — R1310 Dysphagia, unspecified: Secondary | ICD-10-CM | POA: Diagnosis not present

## 2024-02-22 DIAGNOSIS — R1310 Dysphagia, unspecified: Secondary | ICD-10-CM | POA: Diagnosis not present

## 2024-02-23 ENCOUNTER — Encounter (HOSPITAL_BASED_OUTPATIENT_CLINIC_OR_DEPARTMENT_OTHER): Payer: Self-pay

## 2024-02-23 ENCOUNTER — Other Ambulatory Visit (HOSPITAL_BASED_OUTPATIENT_CLINIC_OR_DEPARTMENT_OTHER): Payer: Self-pay

## 2024-02-23 ENCOUNTER — Ambulatory Visit (HOSPITAL_BASED_OUTPATIENT_CLINIC_OR_DEPARTMENT_OTHER)
Admission: EM | Admit: 2024-02-23 | Discharge: 2024-02-23 | Disposition: A | Discharge status: Home / Self Care | Attending: Family Medicine | Admitting: Family Medicine

## 2024-02-23 ENCOUNTER — Ambulatory Visit (HOSPITAL_BASED_OUTPATIENT_CLINIC_OR_DEPARTMENT_OTHER)
Admit: 2024-02-23 | Discharge: 2024-02-23 | Disposition: A | Discharge status: Home / Self Care | Attending: Family Medicine | Admitting: Family Medicine

## 2024-02-23 DIAGNOSIS — R9389 Abnormal findings on diagnostic imaging of other specified body structures: Secondary | ICD-10-CM | POA: Diagnosis not present

## 2024-02-23 DIAGNOSIS — R0602 Shortness of breath: Secondary | ICD-10-CM

## 2024-02-23 DIAGNOSIS — R1310 Dysphagia, unspecified: Secondary | ICD-10-CM | POA: Diagnosis not present

## 2024-02-23 DIAGNOSIS — R0781 Pleurodynia: Secondary | ICD-10-CM

## 2024-02-23 DIAGNOSIS — J984 Other disorders of lung: Secondary | ICD-10-CM | POA: Diagnosis not present

## 2024-02-23 DIAGNOSIS — R918 Other nonspecific abnormal finding of lung field: Secondary | ICD-10-CM | POA: Diagnosis not present

## 2024-02-23 DIAGNOSIS — R079 Chest pain, unspecified: Secondary | ICD-10-CM | POA: Diagnosis not present

## 2024-02-23 MED ORDER — OXYCODONE-ACETAMINOPHEN 5-325 MG PO TABS
1.0000 | ORAL_TABLET | Freq: Three times a day (TID) | ORAL | 0 refills | Status: DC | PRN
  Filled 2024-02-23: qty 15, 5d supply, fill #0

## 2024-02-23 NOTE — ED Provider Notes (Addendum)
 Brian Dunn CARE    CSN: [id number] Arrival date & time: 02/23/24  0940      History   Chief Complaint Chief Complaint  Patient presents with   Rib Injury   Shortness of Breath    HPI Brian Dunn is a 83 y.o. male.   Patient is an 83 year old male with significant past medical history.  Per patient and wife he has had generalized rib pain bilateral and shortness of breath for the past few days.  The pain has worsened.  He denies any falls or injuries.  He denies any cough or congestion.  Does have a history of abdominal aneurysm, asthma, coronary artery disease, dysphagia and currently has feeding tube.  Sits in the chair most days.  No fever, chills, chest discomfort.  No dizziness, lightheadedness.  No nausea, vomiting or diarrhea   Shortness of Breath   Past Medical History:  Diagnosis Date   Acute bronchitis 05/29/2012   Allergic rhinitis    Anemia in chronic kidney disease 02/27/2021   Aneurysm of right subclavian artery (HCC) 02/07/2023   Angina pectoris (HCC) 11/07/2020   Angina, class III (HCC) 11/07/2020   Asthma 06/18/2012   Followed in Pulmonary clinic/ [facility]/ [place]  - 01/09/2018  After extensive coaching inhaler device  effectiveness =   75% > continue symb 160 2bid and return for pfts in 3 months ? RA bronchiolitis?  - PFT's  04/23/2018  FEV1 2.84 (91 % ) ratio 73  p no % improvement from saba p symb 160 prior to study with DLCO  76 % corrects to 79  % for alv volume   - 04/23/2018  After extensive coaching   Benign essential hypertension 06/20/2016   Bilateral inguinal hernia without obstruction or gangrene 06/20/2016   CAD (coronary artery disease), native coronary artery 06/20/2016   Chest pain, unspecified    Chronic ischemic heart disease, unspecified 01/28/2022   Chronic kidney disease 01/28/2022   Closed fracture of part of neck of femur (HCC) 01/22/2007   Annotation: right-sided status post surgery  Qualifier: Diagnosis of   By: Neomi Banks  MD, Anitra Ket     IMO SNOMED Dx Update Oct 2024     COPD (chronic obstructive pulmonary disease) (HCC)    Coronary artery disease involving native coronary artery of native heart with angina pectoris (HCC) 06/20/2016   Cough 03/25/2012   Followed in Pulmonary clinic/ [facility]/ [place]    - Trial off acei again  03/25/2012    - Sinus CT 01/24/11 There is mucosal thickening within the paranasal sinuses without  air-fluid levels. Neither ostiomeatal unit is patent.    Cough variant asthma 10/18/2010   Followed in Pulmonary clinic/ [facility]/ [place]  -HFA 75% p coaching 01/22/2011  -PFT's  02/04/2011 minimal airflow obst, nl dlco    Disorder of bone and cartilage 01/22/2007   Qualifier: Diagnosis of  By: Neomi Banks MD, Anitra Ket.   Overview:  Overview:  Qualifier: Diagnosis of  By: Neomi Banks MD, Jose E.   Dysphagia 12/27/2022   Onset ? Aug 2022 p febrile illness ? Asp pna   -  MBS 01/13/23   PO Diet Recommendation: Regular; Thin liquids (Level 0)  Liquid Administration via: Cup; Spoon; Straw  Supervision: Patient able to self-feed  Postural changes: -- (try reclining when sipping liquids)  Oral care recommendations: Oral care QID (4x/day); Oral care before ice   chips/water   Recommended consults: Consider ENT consultation   Encounter for fitting and adjustment of hearing  aid 01/29/2022   Esophageal dysmotility 03/15/2023   Essential hypertension 11/06/2017   GERD 01/22/2007   Annotation: history of esophageal stricture Qualifier: Diagnosis of  By: Neomi Banks MD, Anitra Ket.    GERD (gastroesophageal reflux disease)    Gout 01/28/2022   Hip fracture (HCC)    Hyperlipidemia 06/20/2016   Hyperlipidemia with target LDL less than 70 06/20/2016   Hypertension    HYPOGONADISM 02/18/2007   Qualifier: Diagnosis of  By: Neomi Banks MD, Anitra Ket.    Hypogonadism male    Kommerell's diverticulum 03/11/2023   Low back pain 09/09/2013   Multiple lung nodules on CT 11/21/2015   CT [place] 10/17/15 mpns > 15 y since quit smoking > rec 12  m f/u as this is low risk > done 06/04/16 no change rec recheck in 58m  - CT [place] 12/08/17 no change nodules > meets benign criteria > no directed f/u - Quantiferon GOLD TB 01/09/18 neg    Osteopenia    Personal history of malignant neoplasm of prostate    Postoperative visit 08/14/2016   Preop cardiovascular exam 02/26/2023   Protein-calorie malnutrition, severe 03/14/2023   Sensorineural hearing loss, bilateral 01/28/2022   Syncope 03/27/2023   Tremor, essential 02/18/2007   Overview:  Overview:  Qualifier: Diagnosis of  By: Neomi Banks MD, Anitra Ket.  Last Assessment & Plan:  Reviewed tremor may increase on dulera  so will need to be balanced against benefits to cough    Patient Active Problem List   Diagnosis Date Noted   Aspiration into airway, initial encounter 10/01/2023   GI bleeding 10/01/2023   Acute respiratory failure with hypoxia (HCC) 10/01/2023   Syncope 03/27/2023   Esophageal dysmotility 03/15/2023   Protein-calorie malnutrition, severe 03/14/2023   Kommerell's diverticulum 03/11/2023   Preop cardiovascular exam 02/26/2023   Aneurysm of right subclavian artery (HCC) 02/07/2023   Dysphagia 12/27/2022   Encounter for fitting and adjustment of hearing aid 01/29/2022   Chronic kidney disease 01/28/2022   Gout 01/28/2022   Sensorineural hearing loss, bilateral 01/28/2022   Chronic ischemic heart disease, unspecified 01/28/2022   Anemia in chronic kidney disease 02/27/2021   Allergic rhinitis    Hypertension    Angina, class III (HCC) 11/07/2020   Angina pectoris (HCC) 11/07/2020   COPD (chronic obstructive pulmonary disease) (HCC)    GERD (gastroesophageal reflux disease)    Hypogonadism male    Osteopenia    Essential hypertension 11/06/2017   Postoperative visit 08/14/2016   Bilateral inguinal hernia without obstruction or gangrene 06/20/2016   Coronary artery disease involving native coronary artery of native heart with angina pectoris (HCC) 06/20/2016   Hyperlipidemia with  target LDL less than 70 06/20/2016   Benign essential hypertension 06/20/2016   CAD (coronary artery disease), native coronary artery 06/20/2016   Hyperlipidemia 06/20/2016   Multiple lung nodules on CT 11/21/2015   Low back pain 09/09/2013   Asthma 06/18/2012   Acute bronchitis 05/29/2012   Cough 03/25/2012   Cough variant asthma 10/18/2010   HYPOGONADISM 02/18/2007   Tremor, essential 02/18/2007   GERD 01/22/2007   Disorder of bone and cartilage 01/22/2007   CHEST PAIN 01/22/2007   Closed fracture of part of neck of femur (HCC) 01/22/2007   HX, PERSONAL, MALIGNANCY, PROSTATE 01/22/2007   Hip fracture (HCC) 01/22/2007    Past Surgical History:  Procedure Laterality Date   CAROTID-SUBCLAVIAN BYPASS GRAFT Right 03/11/2023   Procedure: RIGHT SUBCLAVIAN-CAROTID TRANSPOSITION;  Surgeon: Adine Hoof, MD;  Location[facility] OR;  Service: Vascular;  Laterality: Right;   CORONARY ANGIOPLASTY WITH STENT PLACEMENT  11/04/2006   CORONARY STENT INTERVENTION N/A 11/16/2020   Procedure: CORONARY STENT INTERVENTION;  Surgeon: Arleen Lacer, MD;  Location: [facility] INVASIVE CV LAB;  Service: Cardiovascular;  Laterality: N/A;   EYE SURGERY Bilateral 11/2022   cataract removal   INSERTION OF ILIAC STENT Left 03/11/2023   Procedure: INSERTION OF LEFT ILIAC STENT USING VIABAHN VBX X 54. STENT;  Surgeon: Adine Hoof, MD;  Location: [facility] OR;  Service: Vascular;  Laterality: Left;   INSERTION PROSTATE RADIATION SEED     IR REPLC GASTRO/COLONIC TUBE PERCUT W/FLUORO  09/04/2023   IR REPLC GASTRO/COLONIC TUBE PERCUT W/FLUORO  10/01/2023   IR REPLC GASTRO/COLONIC TUBE PERCUT W/FLUORO  10/13/2023   IR REPLC GASTRO/COLONIC TUBE PERCUT W/FLUORO  12/19/2023   IR REPLC GASTRO/COLONIC TUBE PERCUT W/FLUORO  01/13/2024   IR REPLC GASTRO/COLONIC TUBE PERCUT W/FLUORO  01/28/2024   LEFT HEART CATH AND CORONARY ANGIOGRAPHY N/A 11/16/2020   Procedure: LEFT HEART CATH AND CORONARY ANGIOGRAPHY;   Surgeon: Arleen Lacer, MD;  Location: [facility] INVASIVE CV LAB;  Service: Cardiovascular;  Laterality: N/A;   PEG PLACEMENT N/A 03/19/2023   Procedure: PERCUTANEOUS ENDOSCOPIC GASTROSTOMY (PEG) PLACEMENT;  Surgeon: Anda Bamberg, MD;  Location: [facility] OR;  Service: General;  Laterality: N/A;   THORACIC AORTIC ENDOVASCULAR STENT GRAFT Bilateral 03/11/2023   Procedure: THORACIC AORTIC ENDOVASCULAR STENT GRAFT USING ZENITH ALPHA X STENT (COOK);  Surgeon: Adine Hoof, MD;  Location: [facility] OR;  Service: Vascular;  Laterality: Bilateral;   ULTRASOUND GUIDANCE FOR VASCULAR ACCESS Bilateral 03/11/2023   Procedure: ULTRASOUND GUIDANCE FOR VASCULAR ACCESS;  Surgeon: Adine Hoof, MD;  Location: [facility] OR;  Service: Vascular;  Laterality: Bilateral;       Home Medications    Prior to Admission medications   Medication Sig Start Date End Date Taking? Authorizing Provider  alfuzosin (UROXATRAL) 10 MG 24 hr tablet Take 10 mg by mouth daily with breakfast. 09/11/23   [provider]  allopurinol  (ZYLOPRIM ) 100 MG tablet Place 100 mg into feeding tube daily. 09/13/13   [provider]  amLODipine  (NORVASC ) 5 MG tablet Place 1 tablet (5 mg total) into feeding tube daily as directed. 10/04/23   Elgergawy, Ardia Kraft, MD  aspirin  81 MG chewable tablet Place 81 mg into feeding tube daily.    [provider]  budesonide -formoterol  (SYMBICORT ) 160-4.5 MCG/ACT inhaler Inhale 2 puffs into the lungs daily.    [provider]  celecoxib  (CELEBREX ) 200 MG capsule Place 200 mg into feeding tube daily.    [provider]  colchicine  0.6 MG tablet Take 0.5 tablets (0.3 mg total) by mouth daily. 10/04/23   Elgergawy, Ardia Kraft, MD  Cyanocobalamin  (VITAMIN B12 PO) Give 5 mg by tube daily.    [provider]  dicyclomine (BENTYL) 10 MG/5ML solution Place 20 mg into feeding tube See admin instructions. Give 10mL via G-tube 3 times a day as needed for  abdominal cramping. 06/13/23   [provider]  docusate (COLACE) 50 MG/5ML liquid Place 10 mLs (100 mg total) into feeding tube daily. 03/25/23   Rhyne, Maryanna Smart, PA-C  famotidine  (PEPCID ) 40 MG tablet Place 1 tablet (40 mg total) into feeding tube daily. 03/25/23   Rhyne, Samantha J, PA-C  Ferrous Sulfate (IRON) 325 (65 Fe) MG TABS 65 mg by Feeding Tube route daily.    [provider]  finasteride  (PROSCAR ) 5 MG tablet 5 mg. Place  1 tablet into the feeding tube daily 08/19/23   [provider]  fluticasone  (FLONASE ) 50 MCG/ACT nasal spray Place 2 sprays into the nose daily. 12/01/10   [provider]  gentamicin cream (GARAMYCIN) 0.1 % Apply 1 Application topically 3 (three) times daily.    [provider]  loratadine  (CLARITIN ) 10 MG tablet Place 1 tablet (10 mg total) into feeding tube daily. 03/24/23   Rhyne, Samantha J, PA-C  MAGNESIUM  PO 30 mLs by Feeding Tube route daily.    [provider]  mirtazapine  (REMERON ) 15 MG tablet Place 15 mg into feeding tube at bedtime.    [provider]  Multiple Vitamin (MULTIVITAMIN) capsule Place 1 capsule into feeding tube daily. 03/24/23   Rhyne, Samantha J, PA-C  nitroGLYCERIN  (NITROSTAT ) 0.4 MG SL tablet Place 1 tablet (0.4 mg total) under the tongue every 5 (five) minutes as needed for chest pain. 01/29/22   Revankar, Micael Adas, MD  Nutritional Supplements (FEEDING SUPPLEMENT, OSMOLITE 1.5 CAL,) LIQD Place 237 mLs into feeding tube 5 (five) times daily.    [provider]  oxyCODONE -acetaminophen  (PERCOCET/ROXICET) 5-325 MG tablet Take 1 tablet by mouth every 8 (eight) hours as needed for severe pain (pain score 7-10). 02/23/24   Jerri Morale A, FNP  pravastatin  (PRAVACHOL ) 40 MG tablet TAKE 1 TABLET BY MOUTH EVERY DAY 12/24/23   Revankar, Micael Adas, MD  primidone  (MYSOLINE ) 50 MG tablet Place 1 tablet (50 mg total) into feeding tube at bedtime. 03/24/23   Rhyne, Samantha J, PA-C  senna-docusate  (SENOKOT-S) 8.6-50 MG tablet Place 1 tablet into feeding tube at bedtime as needed for mild constipation. 03/24/23   Rhyne, Samantha J, PA-C  traMADol  (ULTRAM ) 50 MG tablet Place 1 tablet (50 mg total) into feeding tube every 6 (six) hours as needed for moderate pain. 03/24/23   Bonnye Butts, PA-C    Family History Family History  Problem Relation Age of Onset   Heart disease Mother    Liver disease Mother    Parkinsonism Father    Asthma Father    Throat cancer Sister    Liver cancer Sister    Diabetes Sister    Asthma Sister    Liver cancer Sister    Heart disease Sister    Colon cancer Neg Hx     Social History Social History   Tobacco Use   Smoking status: Former    Current packs/day: 0.00    Average packs/day: 1 pack/day for 35.0 years (35.0 ttl pk-yrs)    Types: Cigarettes    Start date: 11/04/1961    Quit date: 11/04/1996    Years since quitting: 27.3   Smokeless tobacco: Never  Vaping Use   Vaping status: Never Used  Substance Use Topics   Alcohol use: Not Currently   Drug use: Never     Allergies   Metoprolol  and Morphine   Review of Systems Review of Systems  Respiratory:  Positive for shortness of breath.      Physical Exam Triage Vital Signs ED Triage Vitals  Encounter Vitals Group     BP 02/23/24 1013 (!) 167/73     Systolic BP Percentile --      Diastolic BP Percentile --      Pulse Rate 02/23/24 1013 73     Resp 02/23/24 1013 20     Temp 02/23/24 1013 98.3 F (36.8 C)     Temp Source 02/23/24 1013 Oral     SpO2 02/23/24 1013 96 %  Weight --      Height --      Head Circumference --      Peak Flow --      Pain Score 02/23/24 1017 8     Pain Loc --      Pain Education --      Exclude from Growth Chart --    No data found.  Updated Vital Signs BP (!) 167/73 (BP Location: Right Arm)   Pulse 73   Temp 98.3 F (36.8 C) (Oral)   Resp 20   SpO2 96%   Visual Acuity Right Eye Distance:   Left Eye Distance:   Bilateral  Distance:    Right Eye Near:   Left Eye Near:    Bilateral Near:     Physical Exam Vitals and nursing note reviewed.  Constitutional:      General: He is not in acute distress.    Appearance: He is well-developed. He is not ill-appearing or toxic-appearing.  Eyes:     Conjunctiva/sclera: Conjunctivae normal.  Cardiovascular:     Rate and Rhythm: Normal rate and regular rhythm.     Heart sounds: Normal heart sounds.  Pulmonary:     Effort: Pulmonary effort is normal.     Breath sounds: Normal breath sounds.  Abdominal:     Palpations: Abdomen is soft.     Comments: PEG site assessed with no signs of inflammation/infection  Musculoskeletal:     Cervical back: Normal range of motion.     Comments: Appearance of back is normal.  There is no rashes.  There is no pain with palpation to the spine.  Patient pointing to lower rib area where discomfort is.  No swelling or deformity   Skin:    General: Skin is warm and dry.     Findings: No rash.  Neurological:     Mental Status: He is alert. Mental status is at baseline.     Gait: Gait is intact.      UC Treatments / Results  Labs (all labs ordered are listed, but only abnormal results are displayed) Labs Reviewed - No data to display  EKG   Radiology No results found.  Procedures Procedures (including critical care time)  Medications Ordered in UC Medications - No data to display  Initial Impression / Assessment and Plan / UC Course  I have reviewed the triage vital signs and the nursing notes.  Pertinent labs & imaging results that were available during my care of the patient were reviewed by me and considered in my medical decision making (see chart for details).     Rib pain and shortness of breath-still awaiting official x-ray results.  Doubt fracture due to no falls or injuries. Believe this just may be musculoskeletal pain and the pain is causing his shortness of breath.  Lungs were clear on exam. He is on  tramadol  for chronic pain medication. He also has oxycodone .  He took an oxycodone  last night with that was his last one.  He says this did help some with the pain. Will refill the oxycodone  to take as needed for pain.  Recommend he not mix this with tramadol .  Wife and patient understanding. Also recommended heat to the area/Voltaren gel Will call with any positive x-ray results Otherwise follow-up with his doctor for any continued issues Final Clinical Impressions(s) / UC Diagnoses   Final diagnoses:  Rib pain  SOB (shortness of breath)     Discharge Instructions  I am not seeing anything specific on your x-ray. I will call with any concerning results I am refilling your oxycodone  to use as needed.  Do not use this along with tramadol .  This will cause too much sedation. Also recommend heat pad to the back for 15 to 20 minutes at least 3 times a day You can purchase some Voltaren gel also over-the-counter and rub on the area as this may help With your doctor for any continued issues    ED Prescriptions     Medication Sig Dispense Auth. Provider   oxyCODONE -acetaminophen  (PERCOCET/ROXICET) 5-325 MG tablet Take 1 tablet by mouth every 8 (eight) hours as needed for severe pain (pain score 7-10). 15 tablet Jyllian Haynie A, FNP      I have reviewed the PDMP during this encounter.   Landa Pine, FNP 02/23/24 1247    Landa Pine, FNP 02/23/24 1248

## 2024-02-23 NOTE — ED Triage Notes (Signed)
 Pain to rib cage, shortness of breath. No cough. Patient is on a feeding tube. Has had AAA repair last year. Hx of lung disease altho per spouse, patient becomes angry when it is discussed. Uses inhalers and has a home nebulizer machine.

## 2024-02-23 NOTE — Discharge Instructions (Signed)
 I am not seeing anything specific on your x-ray. I will call with any concerning results I am refilling your oxycodone  to use as needed.  Do not use this along with tramadol .  This will cause too much sedation. Also recommend heat pad to the back for 15 to 20 minutes at least 3 times a day You can purchase some Voltaren gel also over-the-counter and rub on the area as this may help With your doctor for any continued issues

## 2024-02-24 DIAGNOSIS — R1310 Dysphagia, unspecified: Secondary | ICD-10-CM | POA: Diagnosis not present

## 2024-02-25 ENCOUNTER — Ambulatory Visit: Payer: Medicare HMO | Admitting: Vascular Surgery

## 2024-02-25 DIAGNOSIS — R1310 Dysphagia, unspecified: Secondary | ICD-10-CM | POA: Diagnosis not present

## 2024-02-26 DIAGNOSIS — R1310 Dysphagia, unspecified: Secondary | ICD-10-CM | POA: Diagnosis not present

## 2024-02-27 DIAGNOSIS — R1310 Dysphagia, unspecified: Secondary | ICD-10-CM | POA: Diagnosis not present

## 2024-02-28 DIAGNOSIS — R1310 Dysphagia, unspecified: Secondary | ICD-10-CM | POA: Diagnosis not present

## 2024-02-29 DIAGNOSIS — R1310 Dysphagia, unspecified: Secondary | ICD-10-CM | POA: Diagnosis not present

## 2024-03-01 ENCOUNTER — Other Ambulatory Visit (HOSPITAL_BASED_OUTPATIENT_CLINIC_OR_DEPARTMENT_OTHER): Payer: Self-pay

## 2024-03-01 DIAGNOSIS — Z931 Gastrostomy status: Secondary | ICD-10-CM | POA: Diagnosis not present

## 2024-03-01 DIAGNOSIS — R109 Unspecified abdominal pain: Secondary | ICD-10-CM | POA: Diagnosis not present

## 2024-03-01 DIAGNOSIS — K219 Gastro-esophageal reflux disease without esophagitis: Secondary | ICD-10-CM | POA: Diagnosis not present

## 2024-03-01 DIAGNOSIS — Z681 Body mass index (BMI) 19 or less, adult: Secondary | ICD-10-CM | POA: Diagnosis not present

## 2024-03-01 DIAGNOSIS — R1310 Dysphagia, unspecified: Secondary | ICD-10-CM | POA: Diagnosis not present

## 2024-03-01 MED ORDER — OXYCODONE HCL 10 MG PO TABS
10.0000 mg | ORAL_TABLET | Freq: Three times a day (TID) | ORAL | 0 refills | Status: DC | PRN
  Filled 2024-03-01: qty 30, 10d supply, fill #0

## 2024-03-02 DIAGNOSIS — R1310 Dysphagia, unspecified: Secondary | ICD-10-CM | POA: Diagnosis not present

## 2024-03-03 ENCOUNTER — Ambulatory Visit: Attending: Vascular Surgery | Admitting: Vascular Surgery

## 2024-03-03 ENCOUNTER — Encounter: Payer: Self-pay | Admitting: Vascular Surgery

## 2024-03-03 VITALS — BP 143/66 | HR 86 | Temp 98.2°F

## 2024-03-03 DIAGNOSIS — R1310 Dysphagia, unspecified: Secondary | ICD-10-CM | POA: Diagnosis not present

## 2024-03-03 DIAGNOSIS — Q2549 Other congenital malformations of aorta: Secondary | ICD-10-CM

## 2024-03-03 NOTE — Progress Notes (Signed)
 Patient ID: Brian Dunn [facility], male   DOB: 1941/10/14, 83 y.o.   MRN: [id number]  Reason for Consult: Follow-up   Referred by Russell Court, DO  Subjective:     HPI:  Brian Dunn is a 83 y.o. male 1 year status post treatment of Kommerell diverticulum with right subclavian artery to carotid artery transposition and TEVAR.  He states that the last year has really been miserable that he is unable to eat anything without aspirating has had significant issues with his G-tube.  He is currently having right lower chest pain that radiates into his right upper abdomen.  This has been worked up with x-ray without finding of cause.  This has been present for about 1 month.  States that his weight is about back to presurgical weight that he is having issues with hypotension particularly with standing.  Overall he is minimally active due to his current medical issues.  Past Medical History:  Diagnosis Date   Acute bronchitis 05/29/2012   Allergic rhinitis    Anemia in chronic kidney disease 02/27/2021   Aneurysm of right subclavian artery (HCC) 02/07/2023   Angina pectoris (HCC) 11/07/2020   Angina, class III (HCC) 11/07/2020   Asthma 06/18/2012   Followed in Pulmonary clinic/ [facility]/ [place]  - 01/09/2018  After extensive coaching inhaler device  effectiveness =   75% > continue symb 160 2bid and return for pfts in 3 months ? RA bronchiolitis?  - PFT's  04/23/2018  FEV1 2.84 (91 % ) ratio 73  p no % improvement from saba p symb 160 prior to study with DLCO  76 % corrects to 79  % for alv volume   - 04/23/2018  After extensive coaching   Benign essential hypertension 06/20/2016   Bilateral inguinal hernia without obstruction or gangrene 06/20/2016   CAD (coronary artery disease), native coronary artery 06/20/2016   Chest pain, unspecified    Chronic ischemic heart disease, unspecified 01/28/2022   Chronic kidney disease 01/28/2022   Closed fracture of part of neck of femur (HCC)  01/22/2007   Annotation: right-sided status post surgery  Qualifier: Diagnosis of   By: Neomi Banks MD, Anitra Ket     IMO SNOMED Dx Update Oct 2024     COPD (chronic obstructive pulmonary disease) (HCC)    Coronary artery disease involving native coronary artery of native heart with angina pectoris (HCC) 06/20/2016   Cough 03/25/2012   Followed in Pulmonary clinic/ [facility]/ [place]    - Trial off acei again  03/25/2012    - Sinus CT 01/24/11 There is mucosal thickening within the paranasal sinuses without  air-fluid levels. Neither ostiomeatal unit is patent.    Cough variant asthma 10/18/2010   Followed in Pulmonary clinic/ [facility]/ [place]  -HFA 75% p coaching 01/22/2011  -PFT's  02/04/2011 minimal airflow obst, nl dlco    Disorder of bone and cartilage 01/22/2007   Qualifier: Diagnosis of  By: Neomi Banks MD, Anitra Ket.   Overview:  Overview:  Qualifier: Diagnosis of  By: Neomi Banks MD, Jose E.   Dysphagia 12/27/2022   Onset ? Aug 2022 p febrile illness ? Asp pna   -  MBS 01/13/23   PO Diet Recommendation: Regular; Thin liquids (Level 0)  Liquid Administration via: Cup; Spoon; Straw  Supervision: Patient able to self-feed  Postural changes: -- (try reclining when sipping liquids)  Oral care recommendations: Oral care QID (4x/day); Oral care before ice   chips/water   Recommended consults: Consider ENT  consultation   Encounter for fitting and adjustment of hearing aid 01/29/2022   Esophageal dysmotility 03/15/2023   Essential hypertension 11/06/2017   GERD 01/22/2007   Annotation: history of esophageal stricture Qualifier: Diagnosis of  By: Neomi Banks MD, Anitra Ket.    GERD (gastroesophageal reflux disease)    Gout 01/28/2022   Hip fracture (HCC)    Hyperlipidemia 06/20/2016   Hyperlipidemia with target LDL less than 70 06/20/2016   Hypertension    HYPOGONADISM 02/18/2007   Qualifier: Diagnosis of  By: Neomi Banks MD, Anitra Ket.    Hypogonadism male    Kommerell's diverticulum 03/11/2023   Low back pain 09/09/2013   Multiple  lung nodules on CT 11/21/2015   CT [place] 10/17/15 mpns > 15 y since quit smoking > rec 12 m f/u as this is low risk > done 06/04/16 no change rec recheck in 34m  - CT [place] 12/08/17 no change nodules > meets benign criteria > no directed f/u - Quantiferon GOLD TB 01/09/18 neg    Osteopenia    Personal history of malignant neoplasm of prostate    Postoperative visit 08/14/2016   Preop cardiovascular exam 02/26/2023   Protein-calorie malnutrition, severe 03/14/2023   Sensorineural hearing loss, bilateral 01/28/2022   Syncope 03/27/2023   Tremor, essential 02/18/2007   Overview:  Overview:  Qualifier: Diagnosis of  By: Neomi Banks MD, Anitra Ket.  Last Assessment & Plan:  Reviewed tremor may increase on dulera  so will need to be balanced against benefits to cough   Family History  Problem Relation Age of Onset   Heart disease Mother    Liver disease Mother    Parkinsonism Father    Asthma Father    Throat cancer Sister    Liver cancer Sister    Diabetes Sister    Asthma Sister    Liver cancer Sister    Heart disease Sister    Colon cancer Neg Hx    Past Surgical History:  Procedure Laterality Date   CAROTID-SUBCLAVIAN BYPASS GRAFT Right 03/11/2023   Procedure: RIGHT SUBCLAVIAN-CAROTID TRANSPOSITION;  Surgeon: Adine Hoof, MD;  Location: [facility] OR;  Service: Vascular;  Laterality: Right;   CORONARY ANGIOPLASTY WITH STENT PLACEMENT  11/04/2006   CORONARY STENT INTERVENTION N/A 11/16/2020   Procedure: CORONARY STENT INTERVENTION;  Surgeon: Arleen Lacer, MD;  Location: [facility] INVASIVE CV LAB;  Service: Cardiovascular;  Laterality: N/A;   EYE SURGERY Bilateral 11/2022   cataract removal   INSERTION OF ILIAC STENT Left 03/11/2023   Procedure: INSERTION OF LEFT ILIAC STENT USING VIABAHN VBX X 54. STENT;  Surgeon: Adine Hoof, MD;  Location: [facility] OR;  Service: Vascular;  Laterality: Left;   INSERTION PROSTATE RADIATION SEED     IR REPLC GASTRO/COLONIC TUBE PERCUT W/FLUORO   09/04/2023   IR REPLC GASTRO/COLONIC TUBE PERCUT W/FLUORO  10/01/2023   IR REPLC GASTRO/COLONIC TUBE PERCUT W/FLUORO  10/13/2023   IR REPLC GASTRO/COLONIC TUBE PERCUT W/FLUORO  12/19/2023   IR REPLC GASTRO/COLONIC TUBE PERCUT W/FLUORO  01/13/2024   IR REPLC GASTRO/COLONIC TUBE PERCUT W/FLUORO  01/28/2024   LEFT HEART CATH AND CORONARY ANGIOGRAPHY N/A 11/16/2020   Procedure: LEFT HEART CATH AND CORONARY ANGIOGRAPHY;  Surgeon: Arleen Lacer, MD;  Location: [facility] INVASIVE CV LAB;  Service: Cardiovascular;  Laterality: N/A;   PEG PLACEMENT N/A 03/19/2023   Procedure: PERCUTANEOUS ENDOSCOPIC GASTROSTOMY (PEG) PLACEMENT;  Surgeon: Anda Bamberg, MD;  Location: [facility] OR;  Service: General;  Laterality: N/A;   THORACIC AORTIC ENDOVASCULAR STENT GRAFT  Bilateral 03/11/2023   Procedure: THORACIC AORTIC ENDOVASCULAR STENT GRAFT USING ZENITH ALPHA X STENT (COOK);  Surgeon: Adine Hoof, MD;  Location: [facility] OR;  Service: Vascular;  Laterality: Bilateral;   ULTRASOUND GUIDANCE FOR VASCULAR ACCESS Bilateral 03/11/2023   Procedure: ULTRASOUND GUIDANCE FOR VASCULAR ACCESS;  Surgeon: Adine Hoof, MD;  Location: [facility] OR;  Service: Vascular;  Laterality: Bilateral;    Short Social History:  Social History   Tobacco Use   Smoking status: Former    Current packs/day: 0.00    Average packs/day: 1 pack/day for 35.0 years (35.0 ttl pk-yrs)    Types: Cigarettes    Start date: 11/04/1961    Quit date: 11/04/1996    Years since quitting: 27.3   Smokeless tobacco: Never  Substance Use Topics   Alcohol use: Not Currently    Allergies  Allergen Reactions   Metoprolol  Other (See Comments)    Weight gain   Morphine Rash    Current Outpatient Medications  Medication Sig Dispense Refill   alfuzosin (UROXATRAL) 10 MG 24 hr tablet Take 10 mg by mouth daily with breakfast.     allopurinol  (ZYLOPRIM ) 100 MG tablet Place 100 mg into feeding tube daily.     amLODipine  (NORVASC ) 5 MG tablet  Place 1 tablet (5 mg total) into feeding tube daily as directed. 30 tablet 0   aspirin  81 MG chewable tablet Place 81 mg into feeding tube daily.     budesonide -formoterol  (SYMBICORT ) 160-4.5 MCG/ACT inhaler Inhale 2 puffs into the lungs daily.     celecoxib  (CELEBREX ) 200 MG capsule Place 200 mg into feeding tube daily.     colchicine  0.6 MG tablet Take 0.5 tablets (0.3 mg total) by mouth daily. 3 tablet 0   Cyanocobalamin  (VITAMIN B12 PO) Give 5 mg by tube daily.     dicyclomine (BENTYL) 10 MG/5ML solution Place 20 mg into feeding tube See admin instructions. Give 10mL via G-tube 3 times a day as needed for abdominal cramping.     docusate (COLACE) 50 MG/5ML liquid Place 10 mLs (100 mg total) into feeding tube daily. 100 mL 3   famotidine  (PEPCID ) 40 MG tablet Place 1 tablet (40 mg total) into feeding tube daily.     Ferrous Sulfate (IRON) 325 (65 Fe) MG TABS 65 mg by Feeding Tube route daily.     finasteride  (PROSCAR ) 5 MG tablet 5 mg. Place 1 tablet into the feeding tube daily     fluticasone  (FLONASE ) 50 MCG/ACT nasal spray Place 2 sprays into the nose daily.     gentamicin cream (GARAMYCIN) 0.1 % Apply 1 Application topically 3 (three) times daily.     loratadine  (CLARITIN ) 10 MG tablet Place 1 tablet (10 mg total) into feeding tube daily.     MAGNESIUM  PO 30 mLs by Feeding Tube route daily.     mirtazapine  (REMERON ) 15 MG tablet Place 15 mg into feeding tube at bedtime.     Multiple Vitamin (MULTIVITAMIN) capsule Place 1 capsule into feeding tube daily.     nitroGLYCERIN  (NITROSTAT ) 0.4 MG SL tablet Place 1 tablet (0.4 mg total) under the tongue every 5 (five) minutes as needed for chest pain. 25 tablet 11   Nutritional Supplements (FEEDING SUPPLEMENT, OSMOLITE 1.5 CAL,) LIQD Place 237 mLs into feeding tube 5 (five) times daily.     Oxycodone  HCl 10 MG TABS Take 1 tablet (10 mg total) by mouth every 8 (eight) hours as needed for severe pain 30 tablet 0  oxyCODONE -acetaminophen   (PERCOCET/ROXICET) 5-325 MG tablet Take 1 tablet by mouth every 8 (eight) hours as needed for severe pain (pain score 7-10). 15 tablet 0   pravastatin  (PRAVACHOL ) 40 MG tablet TAKE 1 TABLET BY MOUTH EVERY DAY 90 tablet 2   primidone  (MYSOLINE ) 50 MG tablet Place 1 tablet (50 mg total) into feeding tube at bedtime.     senna-docusate (SENOKOT-S) 8.6-50 MG tablet Place 1 tablet into feeding tube at bedtime as needed for mild constipation.     traMADol  (ULTRAM ) 50 MG tablet Place 1 tablet (50 mg total) into feeding tube every 6 (six) hours as needed for moderate pain.     No current facility-administered medications for this visit.    Review of Systems  Constitutional: Positive for fatigue.  HENT: Positive for trouble swallowing.  Eyes: Eyes negative.  Respiratory: Positive for cough.       Difficulty speaking GI: Gastrointestinal negative.  Musculoskeletal: Musculoskeletal negative.  Skin: Skin negative.  Neurological: Positive for focal weakness and light-headedness.  Hematologic: Hematologic/lymphatic negative.  Psychiatric: Psychiatric negative.        Objective:  Objective   Vitals:   03/03/24 1410  BP: (!) 143/66  Pulse: 86  Temp: 98.2 F (36.8 C)  SpO2: 95%   There is no height or weight on file to calculate BMI.  Physical Exam HENT:     Head: Normocephalic.     Nose: Nose normal.  Eyes:     Pupils: Pupils are equal, round, and reactive to light.  Cardiovascular:     Rate and Rhythm: Normal rate.     Pulses:          Radial pulses are 2+ on the right side and 2+ on the left side.  Pulmonary:     Effort: Pulmonary effort is normal.  Abdominal:     General: Abdomen is flat.  Musculoskeletal:        General: Normal range of motion.     Right lower leg: No edema.     Left lower leg: No edema.  Skin:    General: Skin is warm.     Capillary Refill: Capillary refill takes less than 2 seconds.  Neurological:     General: No focal deficit present.     Mental  Status: He is alert.  Psychiatric:        Mood and Affect: Mood normal.        Thought Content: Thought content normal.        Judgment: Judgment normal.     Data: CT IMPRESSION: Similar postsurgical changes of ligation of Kommerell diverticulum with anastomosis of the right subclavian artery to the right common carotid artery. Similar mild narrowing at the anastomosis. Thrombosis of the Kommerell diverticulum is unchanged.   Multifocal atherosclerosis of the arteries in the neck. No high-grade stenosis.       Assessment/Plan:    83 year old male status posttreatment of a Kommerell diverticulum.  CT scan reviewed with patient and his wife today.  Unfortunately due to his surgical nerve injury he is unable to eat without aspirating and he has difficulty with speech secondary to vocal cord paralysis.  Patient is considering hospice given his decreased quality of life.  I will not have him set him a follow-up at this time he can follow-up as needed.     Adine Hoof MD [facility]

## 2024-03-04 DIAGNOSIS — R1310 Dysphagia, unspecified: Secondary | ICD-10-CM | POA: Diagnosis not present

## 2024-03-05 DIAGNOSIS — R1310 Dysphagia, unspecified: Secondary | ICD-10-CM | POA: Diagnosis not present

## 2024-03-06 DIAGNOSIS — R1310 Dysphagia, unspecified: Secondary | ICD-10-CM | POA: Diagnosis not present

## 2024-03-07 DIAGNOSIS — R1310 Dysphagia, unspecified: Secondary | ICD-10-CM | POA: Diagnosis not present

## 2024-03-08 DIAGNOSIS — R1310 Dysphagia, unspecified: Secondary | ICD-10-CM | POA: Diagnosis not present

## 2024-03-09 DIAGNOSIS — R1314 Dysphagia, pharyngoesophageal phase: Secondary | ICD-10-CM | POA: Diagnosis not present

## 2024-03-09 DIAGNOSIS — R1310 Dysphagia, unspecified: Secondary | ICD-10-CM | POA: Diagnosis not present

## 2024-03-09 DIAGNOSIS — J3801 Paralysis of vocal cords and larynx, unilateral: Secondary | ICD-10-CM | POA: Diagnosis not present

## 2024-03-10 DIAGNOSIS — R1310 Dysphagia, unspecified: Secondary | ICD-10-CM | POA: Diagnosis not present

## 2024-03-11 ENCOUNTER — Ambulatory Visit: Attending: Cardiology | Admitting: Cardiology

## 2024-03-11 ENCOUNTER — Encounter: Payer: Self-pay | Admitting: Cardiology

## 2024-03-11 VITALS — BP 152/80 | HR 66 | Ht 70.0 in | Wt 130.4 lb

## 2024-03-11 DIAGNOSIS — I10 Essential (primary) hypertension: Secondary | ICD-10-CM

## 2024-03-11 DIAGNOSIS — I25119 Atherosclerotic heart disease of native coronary artery with unspecified angina pectoris: Secondary | ICD-10-CM

## 2024-03-11 DIAGNOSIS — I2510 Atherosclerotic heart disease of native coronary artery without angina pectoris: Secondary | ICD-10-CM | POA: Diagnosis not present

## 2024-03-11 DIAGNOSIS — I728 Aneurysm of other specified arteries: Secondary | ICD-10-CM | POA: Diagnosis not present

## 2024-03-11 DIAGNOSIS — E785 Hyperlipidemia, unspecified: Secondary | ICD-10-CM

## 2024-03-11 DIAGNOSIS — R1310 Dysphagia, unspecified: Secondary | ICD-10-CM | POA: Diagnosis not present

## 2024-03-11 NOTE — Patient Instructions (Signed)

## 2024-03-11 NOTE — Progress Notes (Signed)
 Cardiology Office Note:    Date:  03/11/2024   ID:  Brian Dunn, DOB 1941/07/09, MRN [id number]  PCP:  Beecher Bower, MD  Cardiologist:  Nelia Balzarine, MD   Referring MD: Beecher Bower, MD    ASSESSMENT:    1. Aneurysm of right subclavian artery (HCC)   2. Coronary artery disease involving native coronary artery of native heart without angina pectoris   3. Coronary artery disease involving native coronary artery of native heart with angina pectoris (HCC)   4. Essential hypertension   5. Hyperlipidemia with target LDL less than 70    PLAN:    In order of problems listed above:  Coronary artery disease: Secondary prevention stressed with the patient.  Importance of compliance with diet medication stressed annual cholesterol screening. Essential hypertension tension: Stable blood pressure.  He mentions to me that his blood pressure is elevated when he is in pain.  He was uncomfortable during the office visit Mixed dyslipidemia: Lipid-lowering medications are on board and he takes them regularly.  Lipids are followed by primary care. 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 Patient will be seen in follow-up appointment in 12 months or earlier if the patient has any concerns.    Medication Adjustments/Labs and Tests Ordered: Current medicines are reviewed at length with the patient today.  Concerns regarding medicines are outlined above.  No orders of the defined types were placed in this encounter.  No orders of the defined types were placed in this encounter.    No chief complaint on file.    History of Present Illness:    Brian Dunn is a 83 y.o. male.  Patient has history of subclavian aneurysm for which he underwent surgery.  This has left him apparently with vagus nerve damage and inability to swallow.  He has a PEG.  No chest pain orthopnea or PND.  He has history  of dyslipidemia.  At the time of my evaluation, the patient is alert awake oriented and in no distress.  He is accompanied by his wife who is very supportive.  Past Medical History:  Diagnosis Date   Acute bronchitis 05/29/2012   Acute respiratory failure with hypoxia (HCC) 10/01/2023   Allergic rhinitis    Anemia in chronic kidney disease 02/27/2021   Aneurysm of right subclavian artery (HCC) 02/07/2023   Angina pectoris (HCC) 11/07/2020   Angina, class III (HCC) 11/07/2020   Aspiration into airway, initial encounter 10/01/2023   Asthma 06/18/2012   Followed in Pulmonary clinic/ [facility]/ [place]  - 01/09/2018  After extensive coaching inhaler device  effectiveness =   75% > continue symb 160 2bid and return for pfts in 3 months ? RA bronchiolitis?  - PFT's  04/23/2018  FEV1 2.84 (91 % ) ratio 73  p no % improvement from saba p symb 160 prior to study with DLCO  76 % corrects to 79  % for alv volume   - 04/23/2018  After extensive coaching   Benign essential hypertension 06/20/2016   Bilateral inguinal hernia without obstruction or gangrene 06/20/2016   CAD (coronary artery disease), native coronary artery 06/20/2016   Chest pain, unspecified    Chronic ischemic heart disease, unspecified 01/28/2022   Chronic kidney disease 01/28/2022   Closed fracture of part of neck of femur (HCC) 01/22/2007   Annotation: right-sided status post surgery  Qualifier: Diagnosis of  ByNeomi Banks MD, Anitra Ket     IMO SNOMED Dx Update Oct 2024     COPD (chronic obstructive pulmonary disease) (HCC)    Coronary artery disease involving native coronary artery of native heart with angina pectoris (HCC) 06/20/2016   Cough 03/25/2012   Followed in Pulmonary clinic/ [facility]/ [place]    - Trial off acei again  03/25/2012    - Sinus CT 01/24/11 There is mucosal thickening within the paranasal sinuses without  air-fluid levels. Neither ostiomeatal unit is patent.    Cough variant asthma 10/18/2010   Followed in  Pulmonary clinic/ [facility]/ [place]  -HFA 75% p coaching 01/22/2011  -PFT's  02/04/2011 minimal airflow obst, nl dlco    Disorder of bone and cartilage 01/22/2007   Qualifier: Diagnosis of  By: Neomi Banks MD, Anitra Ket.   Overview:  Overview:  Qualifier: Diagnosis of  By: Neomi Banks MD, Jose E.   Dysphagia 12/27/2022   Onset ? Aug 2022 p febrile illness ? Asp pna   -  MBS 01/13/23   PO Diet Recommendation: Regular; Thin liquids (Level 0)  Liquid Administration via: Cup; Spoon; Straw  Supervision: Patient able to self-feed  Postural changes: -- (try reclining when sipping liquids)  Oral care recommendations: Oral care QID (4x/day); Oral care before ice   chips/water   Recommended consults: Consider ENT consultation   Encounter for fitting and adjustment of hearing aid 01/29/2022   Esophageal dysmotility 03/15/2023   Essential hypertension 11/06/2017   GERD 01/22/2007   Annotation: history of esophageal stricture Qualifier: Diagnosis of  By: Neomi Banks MD, Anitra Ket.    GERD (gastroesophageal reflux disease)    GI bleeding 10/01/2023   Gout 01/28/2022   Hip fracture (HCC)    Hyperlipidemia 06/20/2016   Hyperlipidemia with target LDL less than 70 06/20/2016   Hypertension    HYPOGONADISM 02/18/2007   Qualifier: Diagnosis of  By: Neomi Banks MD, Anitra Ket.    Hypogonadism male    Kommerell's diverticulum 03/11/2023   Low back pain 09/09/2013   Multiple lung nodules on CT 11/21/2015   CT [place] 10/17/15 mpns > 15 y since quit smoking > rec 12 m f/u as this is low risk > done 06/04/16 no change rec recheck in 77m  - CT [place] 12/08/17 no change nodules > meets benign criteria > no directed f/u - Quantiferon GOLD TB 01/09/18 neg    Osteopenia    Personal history of malignant neoplasm of prostate    Postoperative visit 08/14/2016   Preop cardiovascular exam 02/26/2023   Protein-calorie malnutrition, severe 03/14/2023   Sensorineural hearing loss, bilateral 01/28/2022   Syncope 03/27/2023   Tremor, essential 02/18/2007   Overview:   Overview:  Qualifier: Diagnosis of  By: Neomi Banks MD, Anitra Ket.  Last Assessment & Plan:  Reviewed tremor may increase on dulera  so will need to be balanced against benefits to cough    Past Surgical History:  Procedure Laterality Date   CAROTID-SUBCLAVIAN BYPASS GRAFT Right 03/11/2023   Procedure: RIGHT SUBCLAVIAN-CAROTID TRANSPOSITION;  Surgeon: Adine Hoof, MD;  Location[facility];  Service: Vascular;  Laterality: Right;   CORONARY ANGIOPLASTY WITH STENT PLACEMENT  11/04/2006   CORONARY STENT INTERVENTION N/A 11/16/2020   Procedure: CORONARY STENT INTERVENTION;  Surgeon: Arleen Lacer, MD;  Location: [facility] INVASIVE CV LAB;  Service: Cardiovascular;  Laterality: N/A;   EYE SURGERY Bilateral 11/2022   cataract removal   INSERTION OF ILIAC STENT Left 03/11/2023   Procedure: INSERTION OF LEFT ILIAC STENT USING VIABAHN  VBX X 54. STENT;  Surgeon: Adine Hoof, MD;  Location: [facility] OR;  Service: Vascular;  Laterality: Left;   INSERTION PROSTATE RADIATION SEED     IR REPLC GASTRO/COLONIC TUBE PERCUT W/FLUORO  09/04/2023   IR REPLC GASTRO/COLONIC TUBE PERCUT W/FLUORO  10/01/2023   IR REPLC GASTRO/COLONIC TUBE PERCUT W/FLUORO  10/13/2023   IR REPLC GASTRO/COLONIC TUBE PERCUT W/FLUORO  12/19/2023   IR REPLC GASTRO/COLONIC TUBE PERCUT W/FLUORO  01/13/2024   IR REPLC GASTRO/COLONIC TUBE PERCUT W/FLUORO  01/28/2024   LEFT HEART CATH AND CORONARY ANGIOGRAPHY N/A 11/16/2020   Procedure: LEFT HEART CATH AND CORONARY ANGIOGRAPHY;  Surgeon: Arleen Lacer, MD;  Location[facility] INVASIVE CV LAB;  Service: Cardiovascular;  Laterality: N/A;   PEG PLACEMENT N/A 03/19/2023   Procedure: PERCUTANEOUS ENDOSCOPIC GASTROSTOMY (PEG) PLACEMENT;  Surgeon: Anda Bamberg, MD;  Location: [facility] OR;  Service: General;  Laterality: N/A;   THORACIC AORTIC ENDOVASCULAR STENT GRAFT Bilateral 03/11/2023   Procedure: THORACIC AORTIC ENDOVASCULAR STENT GRAFT USING ZENITH ALPHA X STENT (COOK);  Surgeon: Adine Hoof, MD;  Location: [facility] OR;  Service: Vascular;  Laterality: Bilateral;   ULTRASOUND GUIDANCE FOR VASCULAR ACCESS Bilateral 03/11/2023   Procedure: ULTRASOUND GUIDANCE FOR VASCULAR ACCESS;  Surgeon: Adine Hoof, MD;  Location: [facility] OR;  Service: Vascular;  Laterality: Bilateral;    Current Medications: Current Meds  Medication Sig   alfuzosin (UROXATRAL) 10 MG 24 hr tablet Take 10 mg by mouth daily with breakfast.   allopurinol  (ZYLOPRIM ) 100 MG tablet Place 100 mg into feeding tube daily.   aspirin  81 MG chewable tablet Place 81 mg into feeding tube daily.   budesonide -formoterol  (SYMBICORT ) 160-4.5 MCG/ACT inhaler Inhale 2 puffs into the lungs daily.   celecoxib  (CELEBREX ) 200 MG capsule Place 200 mg into feeding tube daily.   colchicine  0.6 MG tablet Take 0.5 tablets (0.3 mg total) by mouth daily.   dicyclomine (BENTYL) 10 MG/5ML solution Place 20 mg into feeding tube See admin instructions. Give 10mL via G-tube 3 times a day as needed for abdominal cramping.   docusate (COLACE) 50 MG/5ML liquid Place 10 mLs (100 mg total) into feeding tube daily.   famotidine  (PEPCID ) 40 MG tablet Place 1 tablet (40 mg total) into feeding tube daily.   ferrous sulfate 325 (65 FE) MG EC tablet Take 325 mg by mouth daily.   finasteride  (PROSCAR ) 5 MG tablet 5 mg. Place 1 tablet into the feeding tube daily   fluticasone  (FLONASE ) 50 MCG/ACT nasal spray Place 2 sprays into the nose daily.   gentamicin cream (GARAMYCIN) 0.1 % Apply 1 Application topically 3 (three) times daily.   loratadine  (CLARITIN ) 10 MG tablet Place 1 tablet (10 mg total) into feeding tube daily.   MAGNESIUM  PO 30 mLs by Feeding Tube route daily.   metoCLOPramide (REGLAN) 10 MG tablet Take 10 mg by mouth 3 (three) times daily.   mirtazapine  (REMERON ) 15 MG tablet Place 15 mg into feeding tube at bedtime.   Multiple Vitamin (MULTIVITAMIN) capsule Place 1 capsule into feeding tube daily.   nitroGLYCERIN  (NITROSTAT )  0.4 MG SL tablet Place 1 tablet (0.4 mg total) under the tongue every 5 (five) minutes as needed for chest pain.   Nutritional Supplements (FEEDING SUPPLEMENT, OSMOLITE 1.5 CAL,) LIQD Place 237 mLs into feeding tube 5 (five) times daily.   Oxycodone  HCl 10 MG TABS Take 1 tablet (10 mg total) by mouth every 8 (eight) hours as needed for severe pain  oxyCODONE -acetaminophen  (PERCOCET/ROXICET) 5-325 MG tablet Take 1 tablet by mouth every 8 (eight) hours as needed for severe pain (pain score 7-10).   pravastatin  (PRAVACHOL ) 40 MG tablet TAKE 1 TABLET BY MOUTH EVERY DAY   primidone  (MYSOLINE ) 50 MG tablet Place 1 tablet (50 mg total) into feeding tube at bedtime.   senna-docusate (SENOKOT-S) 8.6-50 MG tablet Place 1 tablet into feeding tube at bedtime as needed for mild constipation.   traMADol  (ULTRAM ) 50 MG tablet Place 1 tablet (50 mg total) into feeding tube every 6 (six) hours as needed for moderate pain.     Allergies:   Metoprolol  and Morphine   Social History   Socioeconomic History   Marital status: Married    Spouse name: Rose   Number of children: 2   Years of education: Not on file   Highest education level: Not on file  Occupational History   Occupation: retired  Tobacco Use   Smoking status: Former    Current packs/day: 0.00    Average packs/day: 1 pack/day for 35.0 years (35.0 ttl pk-yrs)    Types: Cigarettes    Start date: 11/04/1961    Quit date: 11/04/1996    Years since quitting: 27.3   Smokeless tobacco: Never  Vaping Use   Vaping status: Never Used  Substance and Sexual Activity   Alcohol use: Not Currently   Drug use: Never   Sexual activity: Not Currently  Other Topics Concern   Not on file  Social History Narrative   Married and has children.  Works in Airline pilot.   Social Drivers of Corporate investment banker Strain: Not on file  Food Insecurity: No Food Insecurity (10/01/2023)   Hunger Vital Sign    Worried About Running Out of Food in the Last Year: Never  true    Ran Out of Food in the Last Year: Never true  Transportation Needs: No Transportation Needs (10/01/2023)   PRAPARE - Administrator, Civil Service (Medical): No    Lack of Transportation (Non-Medical): No  Physical Activity: Not on file  Stress: No Stress Concern Present (04/14/2023)   [employer] of Occupational Health - Occupational Stress Questionnaire    Feeling of Stress : Only a little  Social Connections: Not on file     Family History: The patient's family history includes Asthma in his father and sister; Diabetes in his sister; Heart disease in his mother and sister; Liver cancer in his sister and sister; Liver disease in his mother; Parkinsonism in his father; Throat cancer in his sister. There is no history of Colon cancer.  ROS:   Please see the history of present illness.    All other systems reviewed and are negative.  EKGs/Labs/Other Studies Reviewed:    The following studies were reviewed today: I discussed my findings with the patient at length   Recent Labs: 10/01/2023: B Natriuretic Peptide 116.2 10/03/2023: Magnesium  1.9 02/18/2024: ALT 32; BUN 32; Creatinine 0.87; Hemoglobin 10.8; Platelet Count 213; Potassium 4.9; Sodium 134  Recent Lipid Panel    Component Value Date/Time   CHOL 113 03/26/2023 2235   CHOL 144 02/07/2023 1007   TRIG 42 03/26/2023 2235   HDL 41 03/26/2023 2235   HDL 74 02/07/2023 1007   CHOLHDL 2.8 03/26/2023 2235   VLDL 8 03/26/2023 2235   LDLCALC 64 03/26/2023 2235   LDLCALC 58 02/07/2023 1007   LDLDIRECT 117.9 01/22/2007 0923    Physical Exam:    VS:  BP (!) 152/80  Pulse 66   Ht 5\' 10"  (1.778 m)   Wt 130 lb 6.4 oz (59.1 kg)   SpO2 99%   BMI 18.71 kg/m     Wt Readings from Last 3 Encounters:  03/11/24 130 lb 6.4 oz (59.1 kg)  01/21/24 132 lb 14.4 oz (60.3 kg)  12/13/23 132 lb (59.9 kg)     GEN: Patient is in no acute distress HEENT: Normal NECK: No JVD; No carotid bruits LYMPHATICS: No  lymphadenopathy CARDIAC: Hear sounds regular, 2/6 systolic murmur at the apex. RESPIRATORY:  Clear to auscultation without rales, wheezing or rhonchi  ABDOMEN: Soft, non-tender, non-distended MUSCULOSKELETAL:  No edema; No deformity  SKIN: Warm and dry NEUROLOGIC:  Alert and oriented x 3 PSYCHIATRIC:  Normal affect   Signed, Nelia Balzarine, MD  03/11/2024 2:09 PM    [facility]

## 2024-03-12 ENCOUNTER — Other Ambulatory Visit (HOSPITAL_BASED_OUTPATIENT_CLINIC_OR_DEPARTMENT_OTHER): Payer: Self-pay

## 2024-03-12 DIAGNOSIS — R1310 Dysphagia, unspecified: Secondary | ICD-10-CM | POA: Diagnosis not present

## 2024-03-12 MED ORDER — OXYCODONE HCL 10 MG PO TABS
10.0000 mg | ORAL_TABLET | Freq: Three times a day (TID) | ORAL | 0 refills | Status: DC | PRN
  Filled 2024-03-12: qty 45, 15d supply, fill #0

## 2024-03-13 DIAGNOSIS — R1310 Dysphagia, unspecified: Secondary | ICD-10-CM | POA: Diagnosis not present

## 2024-03-14 DIAGNOSIS — R1310 Dysphagia, unspecified: Secondary | ICD-10-CM | POA: Diagnosis not present

## 2024-03-15 DIAGNOSIS — R1310 Dysphagia, unspecified: Secondary | ICD-10-CM | POA: Diagnosis not present

## 2024-03-16 ENCOUNTER — Emergency Department (HOSPITAL_COMMUNITY)

## 2024-03-16 ENCOUNTER — Encounter (HOSPITAL_COMMUNITY): Payer: Self-pay | Admitting: Pharmacy Technician

## 2024-03-16 ENCOUNTER — Emergency Department (HOSPITAL_COMMUNITY)
Admission: EM | Admit: 2024-03-16 | Discharge: 2024-03-16 | Disposition: A | Discharge status: Home / Self Care | Attending: Emergency Medicine | Admitting: Emergency Medicine

## 2024-03-16 ENCOUNTER — Other Ambulatory Visit: Payer: Self-pay

## 2024-03-16 DIAGNOSIS — J9811 Atelectasis: Secondary | ICD-10-CM | POA: Diagnosis not present

## 2024-03-16 DIAGNOSIS — Z931 Gastrostomy status: Secondary | ICD-10-CM | POA: Insufficient documentation

## 2024-03-16 DIAGNOSIS — M4854XA Collapsed vertebra, not elsewhere classified, thoracic region, initial encounter for fracture: Secondary | ICD-10-CM | POA: Diagnosis not present

## 2024-03-16 DIAGNOSIS — R1011 Right upper quadrant pain: Secondary | ICD-10-CM | POA: Diagnosis present

## 2024-03-16 DIAGNOSIS — R0602 Shortness of breath: Secondary | ICD-10-CM | POA: Insufficient documentation

## 2024-03-16 DIAGNOSIS — Z7982 Long term (current) use of aspirin: Secondary | ICD-10-CM | POA: Diagnosis not present

## 2024-03-16 DIAGNOSIS — I10 Essential (primary) hypertension: Secondary | ICD-10-CM | POA: Diagnosis not present

## 2024-03-16 DIAGNOSIS — Z79899 Other long term (current) drug therapy: Secondary | ICD-10-CM | POA: Diagnosis not present

## 2024-03-16 DIAGNOSIS — R0781 Pleurodynia: Secondary | ICD-10-CM | POA: Insufficient documentation

## 2024-03-16 DIAGNOSIS — S22070A Wedge compression fracture of T9-T10 vertebra, initial encounter for closed fracture: Secondary | ICD-10-CM | POA: Diagnosis not present

## 2024-03-16 DIAGNOSIS — Z95828 Presence of other vascular implants and grafts: Secondary | ICD-10-CM | POA: Diagnosis not present

## 2024-03-16 DIAGNOSIS — R109 Unspecified abdominal pain: Secondary | ICD-10-CM | POA: Diagnosis not present

## 2024-03-16 DIAGNOSIS — R937 Abnormal findings on diagnostic imaging of other parts of musculoskeletal system: Secondary | ICD-10-CM | POA: Diagnosis not present

## 2024-03-16 DIAGNOSIS — N1330 Unspecified hydronephrosis: Secondary | ICD-10-CM | POA: Diagnosis not present

## 2024-03-16 DIAGNOSIS — R0989 Other specified symptoms and signs involving the circulatory and respiratory systems: Secondary | ICD-10-CM | POA: Diagnosis not present

## 2024-03-16 DIAGNOSIS — R918 Other nonspecific abnormal finding of lung field: Secondary | ICD-10-CM | POA: Diagnosis not present

## 2024-03-16 DIAGNOSIS — K5730 Diverticulosis of large intestine without perforation or abscess without bleeding: Secondary | ICD-10-CM | POA: Diagnosis not present

## 2024-03-16 DIAGNOSIS — S22000A Wedge compression fracture of unspecified thoracic vertebra, initial encounter for closed fracture: Secondary | ICD-10-CM

## 2024-03-16 DIAGNOSIS — R1310 Dysphagia, unspecified: Secondary | ICD-10-CM | POA: Diagnosis not present

## 2024-03-16 DIAGNOSIS — S22080A Wedge compression fracture of T11-T12 vertebra, initial encounter for closed fracture: Secondary | ICD-10-CM | POA: Diagnosis not present

## 2024-03-16 LAB — LIPASE, BLOOD: Lipase: 29 U/L (ref 11–51)

## 2024-03-16 LAB — URINALYSIS, ROUTINE W REFLEX MICROSCOPIC
Bilirubin Urine: NEGATIVE
Glucose, UA: NEGATIVE mg/dL
Hgb urine dipstick: NEGATIVE
Ketones, ur: NEGATIVE mg/dL
Leukocytes,Ua: NEGATIVE
Nitrite: NEGATIVE
Protein, ur: NEGATIVE mg/dL
Specific Gravity, Urine: 1.017 (ref 1.005–1.030)
pH: 6 (ref 5.0–8.0)

## 2024-03-16 LAB — CBC
HCT: 31.1 % — ABNORMAL LOW (ref 39.0–52.0)
Hemoglobin: 9.9 g/dL — ABNORMAL LOW (ref 13.0–17.0)
MCH: 31.3 pg (ref 26.0–34.0)
MCHC: 31.8 g/dL (ref 30.0–36.0)
MCV: 98.4 fL (ref 80.0–100.0)
Platelets: 229 10*3/uL (ref 150–400)
RBC: 3.16 MIL/uL — ABNORMAL LOW (ref 4.22–5.81)
RDW: 15.7 % — ABNORMAL HIGH (ref 11.5–15.5)
WBC: 7.9 10*3/uL (ref 4.0–10.5)
nRBC: 0 % (ref 0.0–0.2)

## 2024-03-16 LAB — COMPREHENSIVE METABOLIC PANEL WITH GFR
ALT: 18 U/L (ref 0–44)
AST: 24 U/L (ref 15–41)
Albumin: 3.5 g/dL (ref 3.5–5.0)
Alkaline Phosphatase: 88 U/L (ref 38–126)
Anion gap: 11 (ref 5–15)
BUN: 27 mg/dL — ABNORMAL HIGH (ref 8–23)
CO2: 23 mmol/L (ref 22–32)
Calcium: 9.1 mg/dL (ref 8.9–10.3)
Chloride: 98 mmol/L (ref 98–111)
Creatinine, Ser: 0.92 mg/dL (ref 0.61–1.24)
GFR, Estimated: >60 mL/min (ref >60)
Glucose, Bld: 97 mg/dL (ref 70–99)
Potassium: 4.2 mmol/L (ref 3.5–5.1)
Sodium: 132 mmol/L — ABNORMAL LOW (ref 135–145)
Total Bilirubin: 0.4 mg/dL (ref 0.0–1.2)
Total Protein: 6.9 g/dL (ref 6.5–8.1)

## 2024-03-16 LAB — TROPONIN I (HIGH SENSITIVITY): Troponin I (High Sensitivity): 7 ng/L (ref <18)

## 2024-03-16 MED ORDER — OXYCODONE HCL 5 MG PO TABS
5.0000 mg | ORAL_TABLET | Freq: Four times a day (QID) | ORAL | 0 refills | Status: DC | PRN
  Filled 2024-03-16: qty 10, 3d supply, fill #0

## 2024-03-16 MED ORDER — SODIUM CHLORIDE 0.9 % IV BOLUS
1000.0000 mL | Freq: Once | INTRAVENOUS | Status: AC
  Administered 2024-03-16: 1000 mL via INTRAVENOUS

## 2024-03-16 MED ORDER — OXYCODONE HCL 5 MG PO TABS: 5.0000 mg | ORAL_TABLET | Freq: Four times a day (QID) | ORAL | 0 refills | Status: DC | PRN

## 2024-03-16 MED ORDER — IOHEXOL 350 MG/ML SOLN
75.0000 mL | Freq: Once | INTRAVENOUS | Status: AC | PRN
  Administered 2024-03-16: 75 mL via INTRAVENOUS

## 2024-03-16 MED ORDER — OXYCODONE-ACETAMINOPHEN 5-325 MG PO TABS
1.0000 | ORAL_TABLET | Freq: Once | ORAL | Status: AC
  Administered 2024-03-16: 1 via ORAL
  Filled 2024-03-16: qty 1

## 2024-03-16 NOTE — ED Provider Notes (Signed)
 [place] EMERGENCY DEPARTMENT AT [facility] Provider Note   CSN: [id number] Arrival date & time: 03/16/24  1054     History  Chief Complaint  Patient presents with   Abdominal Pain   Shortness of Breath    Brian Dunn is a 83 y.o. male.  Brought in by his wife for evaluation of right rib cage pain, right upper quadrant abdominal pain going on for several weeks but significantly worsening.  He does not have associated nausea or vomiting but the pain does radiate to his back.  He has a past medical history of surgical treatment of a artery to carotid artery transposition due to an aneurysm.  He unfortunately suffered a nerve injury and since that time has been unable to eat due to aspiration and vocal cord paralysis which also affects his voice.  He has a gastrostomy tube in place.  He has chronic issues with leakage from the gastrostomy tube that caused skin breakdown.  The patient also reports poor quality of life and significant weight loss since this injury occurred about a year ago.  He states that his pain in the right upper upper quadrant is severe and he is unsure if maybe that is causing increased leakage from his gastrostomy tube.  He denies fever or chills.  He has history of recurrent aspiration on saliva secondary to his vocal cord paralysis.   Abdominal Pain Associated symptoms: shortness of breath   Shortness of Breath Associated symptoms: abdominal pain        Home Medications Prior to Admission medications   Medication Sig Start Date End Date Taking? Authorizing Provider  alfuzosin (UROXATRAL) 10 MG 24 hr tablet Take 10 mg by mouth daily with breakfast. 09/11/23   [provider]  allopurinol  (ZYLOPRIM ) 100 MG tablet Place 100 mg into feeding tube daily. 09/13/13   [provider]  aspirin  81 MG chewable tablet Place 81 mg into feeding tube daily.    [provider]  budesonide -formoterol  (SYMBICORT ) 160-4.5 MCG/ACT inhaler  Inhale 2 puffs into the lungs daily.    [provider]  celecoxib  (CELEBREX ) 200 MG capsule Place 200 mg into feeding tube daily.    [provider]  colchicine  0.6 MG tablet Take 0.5 tablets (0.3 mg total) by mouth daily. 10/04/23   Elgergawy, Dawood S, MD  dicyclomine (BENTYL) 10 MG/5ML solution Place 20 mg into feeding tube See admin instructions. Give 10mL via G-tube 3 times a day as needed for abdominal cramping. 06/13/23   [provider]  docusate (COLACE) 50 MG/5ML liquid Place 10 mLs (100 mg total) into feeding tube daily. 03/25/23   Rhyne, Samantha J, PA-C  famotidine  (PEPCID ) 40 MG tablet Place 1 tablet (40 mg total) into feeding tube daily. 03/25/23   Rhyne, Samantha J, PA-C  ferrous sulfate 325 (65 FE) MG EC tablet Take 325 mg by mouth daily.    [provider]  finasteride  (PROSCAR ) 5 MG tablet 5 mg. Place 1 tablet into the feeding tube daily 08/19/23   [provider]  fluticasone  (FLONASE ) 50 MCG/ACT nasal spray Place 2 sprays into the nose daily. 12/01/10   [provider]  gentamicin cream (GARAMYCIN) 0.1 % Apply 1 Application topically 3 (three) times daily.    [provider]  loratadine  (CLARITIN ) 10 MG tablet Place 1 tablet (10 mg total) into feeding tube daily. 03/24/23   Rhyne, Samantha J, PA-C  MAGNESIUM  PO 30 mLs by Feeding Tube route daily.  [provider]  metoCLOPramide (REGLAN) 10 MG tablet Take 10 mg by mouth 3 (three) times daily. 03/01/24   [provider]  mirtazapine  (REMERON ) 15 MG tablet Place 15 mg into feeding tube at bedtime.    [provider]  Multiple Vitamin (MULTIVITAMIN) capsule Place 1 capsule into feeding tube daily. 03/24/23   Rhyne, Samantha J, PA-C  nitroGLYCERIN  (NITROSTAT ) 0.4 MG SL tablet Place 1 tablet (0.4 mg total) under the tongue every 5 (five) minutes as needed for chest pain. 01/29/22   Revankar, Micael Adas, MD  Nutritional Supplements (FEEDING SUPPLEMENT,  OSMOLITE 1.5 CAL,) LIQD Place 237 mLs into feeding tube 5 (five) times daily.    [provider]  Oxycodone  HCl 10 MG TABS Take 1 tablet (10 mg total) by mouth every 8 (eight) hours as needed for severe pain 03/12/24     oxyCODONE -acetaminophen  (PERCOCET/ROXICET) 5-325 MG tablet Take 1 tablet by mouth every 8 (eight) hours as needed for severe pain (pain score 7-10). 02/23/24   Jerri Morale A, FNP  pravastatin  (PRAVACHOL ) 40 MG tablet TAKE 1 TABLET BY MOUTH EVERY DAY 12/24/23   Revankar, Micael Adas, MD  primidone  (MYSOLINE ) 50 MG tablet Place 1 tablet (50 mg total) into feeding tube at bedtime. 03/24/23   Rhyne, Samantha J, PA-C  senna-docusate (SENOKOT-S) 8.6-50 MG tablet Place 1 tablet into feeding tube at bedtime as needed for mild constipation. 03/24/23   Rhyne, Samantha J, PA-C  traMADol  (ULTRAM ) 50 MG tablet Place 1 tablet (50 mg total) into feeding tube every 6 (six) hours as needed for moderate pain. 03/24/23   Rhyne, Maryanna Smart, PA-C      Allergies    Metoprolol  and Morphine    Review of Systems   Review of Systems  Respiratory:  Positive for shortness of breath.   Gastrointestinal:  Positive for abdominal pain.    Physical Exam Updated Vital Signs BP 127/69 (BP Location: Right Arm)   Pulse 87   Temp 98.1 F (36.7 C)   Resp 20   SpO2 98%  Physical Exam Vitals and nursing note reviewed.  Constitutional:      General: He is not in acute distress.    Appearance: He is well-developed and underweight. He is not diaphoretic.  HENT:     Head: Normocephalic and atraumatic.  Eyes:     General: No scleral icterus.    Conjunctiva/sclera: Conjunctivae normal.  Cardiovascular:     Rate and Rhythm: Normal rate and regular rhythm.     Heart sounds: Normal heart sounds.  Pulmonary:     Effort: Pulmonary effort is normal. No respiratory distress.     Breath sounds: Normal breath sounds.  Abdominal:     Palpations: Abdomen is soft.     Tenderness: There is no abdominal tenderness.      Comments: [employer] feculent drainage from around the G-tube.  There is a little bit of irritated skin and tissue breakdown surrounding this.  The area was cleansed I applied barrier ointment and an ABD pad cut to fit. His abdomen is scaphoid and not significantly tender.  Musculoskeletal:     Cervical back: Normal range of motion and neck supple.  Skin:    General: Skin is warm and dry.  Neurological:     Mental Status: He is alert.  Psychiatric:        Behavior: Behavior normal.     ED Results / Procedures / Treatments   Labs (all labs ordered are listed, but only abnormal results  are displayed) Labs Reviewed  COMPREHENSIVE METABOLIC PANEL WITH GFR - Abnormal; Notable for the following components:      Result Value   Sodium 132 (*)    BUN 27 (*)    All other components within normal limits  CBC - Abnormal; Notable for the following components:   RBC 3.16 (*)    Hemoglobin 9.9 (*)    HCT 31.1 (*)    RDW 15.7 (*)    All other components within normal limits  URINALYSIS, ROUTINE W REFLEX MICROSCOPIC - Abnormal; Notable for the following components:   Color, Urine AMBER (*)    APPearance HAZY (*)    All other components within normal limits  LIPASE, BLOOD  TROPONIN I (HIGH SENSITIVITY)    EKG None  Radiology DG Chest 2 View Result Date: 03/16/2024 CLINICAL DATA:  Abdominal pain and shortness of breath EXAM: CHEST - 2 VIEW COMPARISON:  Chest radiograph dated 02/23/2024, CT chest dated 06/22/2023 FINDINGS: Low lung volumes with bronchovascular crowding. Right lower lung linear opacity. Mild diffuse interstitial and patchy nodular opacities. No pleural effusion or pneumothorax. The heart size and mediastinal contours are within normal limits. Compression deformities of the partially imaged lower thoracic and upper lumbar spine. Aortic vascular stent graft projects over the aortic arch, as before. IMPRESSION: 1. Low lung volumes with bronchovascular crowding. Mild diffuse  interstitial and patchy nodular opacities, which may represent pulmonary edema or atypical infection. 2. Right lower lung linear opacity, likely atelectasis. 3. Partially imaged compression deformities of the lower thoracic and upper lumbar spine, progressed compared to CT chest dated 06/22/2023. Heart Electronically Signed   By: Limin  Xu M.D.   On: 03/16/2024 12:40    Procedures Procedures    Medications Ordered in ED Medications  sodium chloride  0.9 % bolus 1,000 mL (has no administration in time range)    ED Course/ Medical Decision Making/ A&P                                 Medical Decision Making Amount and/or Complexity of Data Reviewed Labs: ordered. Radiology: ordered.   83 year old man with G-tube, right upper quadrant pain, pain with inhalation. The differential diagnosis for RUQ is, but not limited to:  Cholelithiasis, cholecystitis, cholangitis, choledocholithiasis, hepatitis, pancreatitis, RLL pneumonia, pyelonephritis, urinary calculi, abdominal/liver abscess, musculoskeletal pain, herpes zoster.  I visualized and interpreted a chest x-ray which shows compression deformities in the lower thoracic spine I reviewed labs ordered in triage, no significant findings noted.  Patient has CT abdomen and pelvis pending at this time.  Signout given to PA Rigney at shift change         Final Clinical Impression(s) / ED Diagnoses Final diagnoses:  None    Rx / DC Orders ED Discharge Orders     None         Tama Fails, PA-C 03/16/24 1531    Burnette Carte, MD 03/17/24 [phone]

## 2024-03-16 NOTE — Discharge Instructions (Addendum)
 Please wear the TLSO brace until evaluated by neurosurgery.  Return to the emergency department immediately for any new or worsening symptoms.

## 2024-03-16 NOTE — Progress Notes (Signed)
 Orthopedic Tech Progress Note Patient Details:  Brian Dunn [facility] 07-27-1941 [id number]  Ortho Devices Type of Ortho Device: Thoracolumbar corset (TLSO) Ortho Device/Splint Location: Throacic [id number] Ortho Device/Splint Interventions: Ordered, Application, Adjustment   Post Interventions Patient Tolerated: Well Instructions Provided: Care of device Patient has a gastric feeding but we were able to adjust the brace so it does not interfere with his tube. PA stated it was okay to apply the brace. Brian Dunn 03/16/2024, 8:24 PM

## 2024-03-16 NOTE — ED Provider Notes (Signed)
 Patient was signed out to myself at shift change pending CT scans of the chest, abdomen and pelvis.  He does have findings concerning for subacute versus acute compression fractures of the T10 and T11 vertebrae.  Patient has no concerning neurological deficits at this point.  Patient had no indication for pulmonary embolus and no other acute surgical process noted within the CT scan abdomen and pelvis.  Urinalysis is unremarkable with no indication for urinary tract infection.  Blood work is otherwise unremarkable at this point as well.  Did discuss patient case with neurosurgery who did note that the patient can be placed in a TLSO brace and follow-up closely on an outpatient basis.  Will continue pain management on an outpatient basis as well.  Patient was educated on the use of the TLSO brace.  Strict turn precautions were provided for any new or worsening symptoms.  Patient and spouse voiced understanding and had no additional questions. Patient was evaluated by attending physician who is in agreement to plan at this time.    Roselynn Connors, PA-C 03/16/24 1910    Almond Army, MD 03/18/24 2056

## 2024-03-16 NOTE — ED Triage Notes (Addendum)
 Pt here POV with reports of abdominal pain X3 weeks with associated diarrhea. Pain is in the upper R and upper L quadrants. Has a feeding tube in place. R>L. Pt also states feeling shob X1 month.

## 2024-03-16 NOTE — ED Notes (Signed)
 Patient transported to CT

## 2024-03-17 ENCOUNTER — Other Ambulatory Visit: Payer: Self-pay

## 2024-03-17 ENCOUNTER — Other Ambulatory Visit (HOSPITAL_BASED_OUTPATIENT_CLINIC_OR_DEPARTMENT_OTHER): Payer: Self-pay

## 2024-03-17 ENCOUNTER — Inpatient Hospital Stay: Attending: Oncology

## 2024-03-17 DIAGNOSIS — D631 Anemia in chronic kidney disease: Secondary | ICD-10-CM

## 2024-03-17 DIAGNOSIS — N182 Chronic kidney disease, stage 2 (mild): Secondary | ICD-10-CM | POA: Diagnosis present

## 2024-03-17 DIAGNOSIS — R1310 Dysphagia, unspecified: Secondary | ICD-10-CM | POA: Diagnosis not present

## 2024-03-17 LAB — CBC WITH DIFFERENTIAL (CANCER CENTER ONLY)
Abs Immature Granulocytes: 0.02 10*3/uL (ref 0.00–0.07)
Basophils Absolute: 0 10*3/uL (ref 0.0–0.1)
Basophils Relative: 1 %
Eosinophils Absolute: 0.1 10*3/uL (ref 0.0–0.5)
Eosinophils Relative: 2 %
HCT: 31.9 % — ABNORMAL LOW (ref 39.0–52.0)
Hemoglobin: 10.6 g/dL — ABNORMAL LOW (ref 13.0–17.0)
Immature Granulocytes: 0 %
Lymphocytes Relative: 13 %
Lymphs Abs: 0.8 10*3/uL (ref 0.7–4.0)
MCH: 30.6 pg (ref 26.0–34.0)
MCHC: 33.2 g/dL (ref 30.0–36.0)
MCV: 92.2 fL (ref 80.0–100.0)
Monocytes Absolute: 0.6 10*3/uL (ref 0.1–1.0)
Monocytes Relative: 9 %
Neutro Abs: 4.8 10*3/uL (ref 1.7–7.7)
Neutrophils Relative %: 75 %
Platelet Count: 236 10*3/uL (ref 150–400)
RBC: 3.46 MIL/uL — ABNORMAL LOW (ref 4.22–5.81)
RDW: 15.7 % — ABNORMAL HIGH (ref 11.5–15.5)
WBC Count: 6.3 10*3/uL (ref 4.0–10.5)
nRBC: 0 % (ref 0.0–0.2)

## 2024-03-17 LAB — CMP (CANCER CENTER ONLY)
ALT: 14 U/L (ref 0–44)
AST: 24 U/L (ref 15–41)
Albumin: 3.7 g/dL (ref 3.5–5.0)
Alkaline Phosphatase: 117 U/L (ref 38–126)
Anion gap: 13 (ref 5–15)
BUN: 27 mg/dL — ABNORMAL HIGH (ref 8–23)
CO2: 23 mmol/L (ref 22–32)
Calcium: 10 mg/dL (ref 8.9–10.3)
Chloride: 97 mmol/L — ABNORMAL LOW (ref 98–111)
Creatinine: 0.92 mg/dL (ref 0.61–1.24)
GFR, Estimated: >60 mL/min (ref >60)
Glucose, Bld: 78 mg/dL (ref 70–99)
Potassium: 4.4 mmol/L (ref 3.5–5.1)
Sodium: 134 mmol/L — ABNORMAL LOW (ref 135–145)
Total Bilirubin: 0.4 mg/dL (ref 0.0–1.2)
Total Protein: 7.3 g/dL (ref 6.5–8.1)

## 2024-03-17 LAB — IRON AND TIBC
Iron: 35 ug/dL — ABNORMAL LOW (ref 45–182)
Saturation Ratios: 11 % — ABNORMAL LOW (ref 17.9–39.5)
TIBC: 318 ug/dL (ref 250–450)
UIBC: 283 ug/dL

## 2024-03-17 LAB — VITAMIN B12: Vitamin B-12: 1204 pg/mL — ABNORMAL HIGH (ref 180–914)

## 2024-03-17 LAB — FERRITIN: Ferritin: 213 ng/mL (ref 24–336)

## 2024-03-18 DIAGNOSIS — R1310 Dysphagia, unspecified: Secondary | ICD-10-CM | POA: Diagnosis not present

## 2024-03-22 ENCOUNTER — Other Ambulatory Visit (HOSPITAL_COMMUNITY): Payer: Self-pay | Admitting: Interventional Radiology

## 2024-03-22 DIAGNOSIS — Z4659 Encounter for fitting and adjustment of other gastrointestinal appliance and device: Secondary | ICD-10-CM

## 2024-03-23 ENCOUNTER — Other Ambulatory Visit (HOSPITAL_COMMUNITY): Payer: Self-pay | Admitting: Interventional Radiology

## 2024-03-23 ENCOUNTER — Ambulatory Visit (HOSPITAL_COMMUNITY)
Admission: RE | Admit: 2024-03-23 | Discharge: 2024-03-23 | Disposition: A | Discharge status: Home / Self Care | Source: Ambulatory Visit | Attending: Interventional Radiology | Admitting: Interventional Radiology

## 2024-03-23 DIAGNOSIS — K9429 Other complications of gastrostomy: Secondary | ICD-10-CM | POA: Diagnosis not present

## 2024-03-23 DIAGNOSIS — Z4659 Encounter for fitting and adjustment of other gastrointestinal appliance and device: Secondary | ICD-10-CM

## 2024-03-23 DIAGNOSIS — K9423 Gastrostomy malfunction: Secondary | ICD-10-CM | POA: Diagnosis present

## 2024-03-23 HISTORY — PX: IR GASTR TUBE CONVERT GASTR-JEJ PER W/FL MOD SED: IMG2332

## 2024-03-23 MED ORDER — IOHEXOL 300 MG/ML  SOLN
50.0000 mL | Freq: Once | INTRAMUSCULAR | Status: AC | PRN
  Administered 2024-03-23: 10 mL

## 2024-03-23 MED ORDER — LIDOCAINE VISCOUS HCL 2 % MT SOLN
OROMUCOSAL | Status: AC
  Filled 2024-03-23: qty 15

## 2024-03-23 NOTE — Procedures (Signed)
 Interventional Radiology Procedure:   Indications: Leaking gastrostomy tube   Procedure: Conversion of gastrostomy tube to GJ tube  Findings: Leakage around G-tube.  New 30 Fr GJ tube placed, tip in jejunum.  Complications: None     EBL: Minimal  Plan: Instructed patient and wife to use J lumen for feeds and keep external disc cinched tight to skin.   Gavin Faivre R. Julietta Ogren, MD  Pager: [phone]

## 2024-03-25 DIAGNOSIS — R1310 Dysphagia, unspecified: Secondary | ICD-10-CM | POA: Diagnosis not present

## 2024-03-31 DIAGNOSIS — R0781 Pleurodynia: Secondary | ICD-10-CM | POA: Diagnosis not present

## 2024-03-31 DIAGNOSIS — Z681 Body mass index (BMI) 19 or less, adult: Secondary | ICD-10-CM | POA: Diagnosis not present

## 2024-04-01 ENCOUNTER — Other Ambulatory Visit (HOSPITAL_COMMUNITY): Payer: Self-pay | Admitting: Diagnostic Radiology

## 2024-04-01 DIAGNOSIS — Z431 Encounter for attention to gastrostomy: Secondary | ICD-10-CM

## 2024-04-02 ENCOUNTER — Other Ambulatory Visit (HOSPITAL_COMMUNITY): Payer: Self-pay | Admitting: Diagnostic Radiology

## 2024-04-02 ENCOUNTER — Ambulatory Visit (HOSPITAL_COMMUNITY)
Admission: RE | Admit: 2024-04-02 | Discharge: 2024-04-02 | Disposition: A | Discharge status: Home / Self Care | Source: Ambulatory Visit | Attending: Diagnostic Radiology | Admitting: Diagnostic Radiology

## 2024-04-02 DIAGNOSIS — Z431 Encounter for attention to gastrostomy: Secondary | ICD-10-CM | POA: Diagnosis present

## 2024-04-02 DIAGNOSIS — K9419 Other complications of enterostomy: Secondary | ICD-10-CM | POA: Diagnosis not present

## 2024-04-02 HISTORY — PX: IR CM INJ ANY COLONIC TUBE W/FLUORO: IMG2336

## 2024-04-02 MED ORDER — LIDOCAINE VISCOUS HCL 2 % MT SOLN
OROMUCOSAL | Status: AC
Start: 2024-04-02 — End: ?
  Filled 2024-04-02: qty 15

## 2024-04-02 MED ORDER — IOHEXOL 300 MG/ML  SOLN
50.0000 mL | Freq: Once | INTRAMUSCULAR | Status: AC | PRN
  Administered 2024-04-02: 20 mL

## 2024-04-02 NOTE — Procedures (Signed)
 Interventional Radiology Procedure Note   History: Patient reports that there is leakage at the skin site, and that he experiences reflux of material from the J port after use.   Procedure: Fluoro injection and reposition of GJ tube  Findings: The retention balloon had traveled too far into the stomach, beyond the pylorus and likely causing intermittent gastric outlet obstruction.   The balloon was withdrawn to appropriate location at the gastropexy site.   The downstream J port is patent and flushes well.  No kink or break.  No reflux of material at this time.   Complications: None  Recommendations:  - Ok to continue usage of both g and j port - Routine exchanges on schedule.  - The "4" needs to be visible just outside the rubber bumper at the skin site for the balloon to be well positioned.  - OK for DC  Signed,  Marciano Settles. Mabel Savage, DO

## 2024-04-12 ENCOUNTER — Other Ambulatory Visit (HOSPITAL_BASED_OUTPATIENT_CLINIC_OR_DEPARTMENT_OTHER): Payer: Self-pay

## 2024-04-12 ENCOUNTER — Other Ambulatory Visit (HOSPITAL_COMMUNITY): Payer: Self-pay | Admitting: Diagnostic Radiology

## 2024-04-12 DIAGNOSIS — Z431 Encounter for attention to gastrostomy: Secondary | ICD-10-CM

## 2024-04-12 MED ORDER — METHYLPREDNISOLONE 4 MG PO TBPK
ORAL_TABLET | ORAL | 0 refills | Status: DC
  Filled 2024-04-12: qty 21, 6d supply, fill #0

## 2024-04-12 MED ORDER — OXYCODONE HCL 10 MG PO TABS
10.0000 mg | ORAL_TABLET | Freq: Three times a day (TID) | ORAL | 0 refills | Status: DC | PRN
  Filled 2024-04-12: qty 45, 15d supply, fill #0

## 2024-04-14 ENCOUNTER — Inpatient Hospital Stay: Attending: Oncology

## 2024-04-14 ENCOUNTER — Other Ambulatory Visit (HOSPITAL_COMMUNITY): Payer: Self-pay | Admitting: Diagnostic Radiology

## 2024-04-14 ENCOUNTER — Other Ambulatory Visit: Payer: Self-pay | Admitting: Oncology

## 2024-04-14 ENCOUNTER — Ambulatory Visit (HOSPITAL_COMMUNITY)
Admission: RE | Admit: 2024-04-14 | Discharge: 2024-04-14 | Disposition: A | Discharge status: Home / Self Care | Source: Ambulatory Visit | Attending: Diagnostic Radiology | Admitting: Diagnostic Radiology

## 2024-04-14 DIAGNOSIS — K9423 Gastrostomy malfunction: Secondary | ICD-10-CM | POA: Diagnosis present

## 2024-04-14 DIAGNOSIS — K9419 Other complications of enterostomy: Secondary | ICD-10-CM | POA: Diagnosis not present

## 2024-04-14 DIAGNOSIS — N189 Chronic kidney disease, unspecified: Secondary | ICD-10-CM

## 2024-04-14 DIAGNOSIS — Z431 Encounter for attention to gastrostomy: Secondary | ICD-10-CM

## 2024-04-14 HISTORY — PX: IR REPLC GASTRO/COLONIC TUBE PERCUT W/FLUORO: IMG2333

## 2024-04-14 LAB — CBC WITH DIFFERENTIAL (CANCER CENTER ONLY)
Abs Immature Granulocytes: 0.01 10*3/uL (ref 0.00–0.07)
Basophils Absolute: 0 10*3/uL (ref 0.0–0.1)
Basophils Relative: 1 %
Eosinophils Absolute: 0.2 10*3/uL (ref 0.0–0.5)
Eosinophils Relative: 3 %
HCT: 32.1 % — ABNORMAL LOW (ref 39.0–52.0)
Hemoglobin: 10.8 g/dL — ABNORMAL LOW (ref 13.0–17.0)
Immature Granulocytes: 0 %
Lymphocytes Relative: 13 %
Lymphs Abs: 1 10*3/uL (ref 0.7–4.0)
MCH: 31 pg (ref 26.0–34.0)
MCHC: 33.6 g/dL (ref 30.0–36.0)
MCV: 92.2 fL (ref 80.0–100.0)
Monocytes Absolute: 0.5 10*3/uL (ref 0.1–1.0)
Monocytes Relative: 7 %
Neutro Abs: 5.5 10*3/uL (ref 1.7–7.7)
Neutrophils Relative %: 76 %
Platelet Count: 227 10*3/uL (ref 150–400)
RBC: 3.48 MIL/uL — ABNORMAL LOW (ref 4.22–5.81)
RDW: 14.4 % (ref 11.5–15.5)
WBC Count: 7.2 10*3/uL (ref 4.0–10.5)
nRBC: 0 % (ref 0.0–0.2)

## 2024-04-14 MED ORDER — LIDOCAINE VISCOUS HCL 2 % MT SOLN
OROMUCOSAL | Status: AC
Start: 2024-04-14 — End: 2024-04-14
  Filled 2024-04-14: qty 15

## 2024-04-14 MED ORDER — IOHEXOL 300 MG/ML  SOLN
50.0000 mL | Freq: Once | INTRAMUSCULAR | Status: AC | PRN
  Administered 2024-04-14: 40 mL

## 2024-04-14 MED ORDER — LIDOCAINE HCL 1 % IJ SOLN
INTRAMUSCULAR | Status: AC
  Filled 2024-04-14: qty 20

## 2024-04-14 MED ORDER — LIDOCAINE HCL 1 % IJ SOLN
20.0000 mL | Freq: Once | INTRAMUSCULAR | Status: AC
  Administered 2024-04-14: 15 mL

## 2024-04-15 ENCOUNTER — Other Ambulatory Visit (HOSPITAL_BASED_OUTPATIENT_CLINIC_OR_DEPARTMENT_OTHER): Payer: Self-pay

## 2024-04-21 ENCOUNTER — Other Ambulatory Visit: Payer: Self-pay

## 2024-04-21 ENCOUNTER — Inpatient Hospital Stay (HOSPITAL_COMMUNITY)
Admission: EM | Admit: 2024-04-21 | Discharge: 2024-04-26 | DRG: 326 | Disposition: A | Discharge status: Hospice | Attending: Infectious Diseases | Admitting: Infectious Diseases

## 2024-04-21 ENCOUNTER — Telehealth: Payer: Self-pay | Admitting: Student

## 2024-04-21 ENCOUNTER — Telehealth (HOSPITAL_COMMUNITY): Payer: Self-pay

## 2024-04-21 DIAGNOSIS — Z833 Family history of diabetes mellitus: Secondary | ICD-10-CM

## 2024-04-21 DIAGNOSIS — Z7951 Long term (current) use of inhaled steroids: Secondary | ICD-10-CM

## 2024-04-21 DIAGNOSIS — Z79899 Other long term (current) drug therapy: Secondary | ICD-10-CM

## 2024-04-21 DIAGNOSIS — R64 Cachexia: Secondary | ICD-10-CM | POA: Diagnosis present

## 2024-04-21 DIAGNOSIS — H903 Sensorineural hearing loss, bilateral: Secondary | ICD-10-CM | POA: Diagnosis present

## 2024-04-21 DIAGNOSIS — E785 Hyperlipidemia, unspecified: Secondary | ICD-10-CM | POA: Diagnosis present

## 2024-04-21 DIAGNOSIS — Z66 Do not resuscitate: Secondary | ICD-10-CM | POA: Diagnosis present

## 2024-04-21 DIAGNOSIS — D631 Anemia in chronic kidney disease: Secondary | ICD-10-CM | POA: Diagnosis present

## 2024-04-21 DIAGNOSIS — Z681 Body mass index (BMI) 19 or less, adult: Secondary | ICD-10-CM

## 2024-04-21 DIAGNOSIS — Z8249 Family history of ischemic heart disease and other diseases of the circulatory system: Secondary | ICD-10-CM

## 2024-04-21 DIAGNOSIS — Z955 Presence of coronary angioplasty implant and graft: Secondary | ICD-10-CM

## 2024-04-21 DIAGNOSIS — K9420 Gastrostomy complication, unspecified: Secondary | ICD-10-CM

## 2024-04-21 DIAGNOSIS — F32A Depression, unspecified: Secondary | ICD-10-CM | POA: Diagnosis present

## 2024-04-21 DIAGNOSIS — I129 Hypertensive chronic kidney disease with stage 1 through stage 4 chronic kidney disease, or unspecified chronic kidney disease: Secondary | ICD-10-CM | POA: Diagnosis present

## 2024-04-21 DIAGNOSIS — G8929 Other chronic pain: Secondary | ICD-10-CM | POA: Diagnosis present

## 2024-04-21 DIAGNOSIS — Z885 Allergy status to narcotic agent status: Secondary | ICD-10-CM

## 2024-04-21 DIAGNOSIS — I2510 Atherosclerotic heart disease of native coronary artery without angina pectoris: Secondary | ICD-10-CM | POA: Diagnosis present

## 2024-04-21 DIAGNOSIS — Z8546 Personal history of malignant neoplasm of prostate: Secondary | ICD-10-CM

## 2024-04-21 DIAGNOSIS — K9423 Gastrostomy malfunction: Secondary | ICD-10-CM | POA: Diagnosis not present

## 2024-04-21 DIAGNOSIS — K219 Gastro-esophageal reflux disease without esophagitis: Secondary | ICD-10-CM | POA: Diagnosis present

## 2024-04-21 DIAGNOSIS — D649 Anemia, unspecified: Secondary | ICD-10-CM | POA: Diagnosis present

## 2024-04-21 DIAGNOSIS — Z7982 Long term (current) use of aspirin: Secondary | ICD-10-CM

## 2024-04-21 DIAGNOSIS — R54 Age-related physical debility: Secondary | ICD-10-CM | POA: Diagnosis present

## 2024-04-21 DIAGNOSIS — E43 Unspecified severe protein-calorie malnutrition: Secondary | ICD-10-CM | POA: Diagnosis present

## 2024-04-21 DIAGNOSIS — Z888 Allergy status to other drugs, medicaments and biological substances status: Secondary | ICD-10-CM

## 2024-04-21 DIAGNOSIS — Z825 Family history of asthma and other chronic lower respiratory diseases: Secondary | ICD-10-CM

## 2024-04-21 DIAGNOSIS — R532 Functional quadriplegia: Secondary | ICD-10-CM | POA: Diagnosis present

## 2024-04-21 DIAGNOSIS — F419 Anxiety disorder, unspecified: Secondary | ICD-10-CM | POA: Diagnosis present

## 2024-04-21 DIAGNOSIS — Z800 Family history of malignant neoplasm of digestive organs: Secondary | ICD-10-CM

## 2024-04-21 DIAGNOSIS — R1084 Generalized abdominal pain: Secondary | ICD-10-CM | POA: Diagnosis not present

## 2024-04-21 DIAGNOSIS — Z515 Encounter for palliative care: Secondary | ICD-10-CM

## 2024-04-21 DIAGNOSIS — Z87891 Personal history of nicotine dependence: Secondary | ICD-10-CM

## 2024-04-21 DIAGNOSIS — J690 Pneumonitis due to inhalation of food and vomit: Secondary | ICD-10-CM | POA: Diagnosis not present

## 2024-04-21 DIAGNOSIS — J4489 Other specified chronic obstructive pulmonary disease: Secondary | ICD-10-CM | POA: Diagnosis present

## 2024-04-21 DIAGNOSIS — M109 Gout, unspecified: Secondary | ICD-10-CM | POA: Diagnosis present

## 2024-04-21 DIAGNOSIS — Y848 Other medical procedures as the cause of abnormal reaction of the patient, or of later complication, without mention of misadventure at the time of the procedure: Secondary | ICD-10-CM | POA: Diagnosis present

## 2024-04-21 LAB — LIPASE, BLOOD: Lipase: 27 U/L (ref 11–51)

## 2024-04-21 LAB — CBC WITH DIFFERENTIAL/PLATELET
Abs Immature Granulocytes: 0.02 10*3/uL (ref 0.00–0.07)
Basophils Absolute: 0 10*3/uL (ref 0.0–0.1)
Basophils Relative: 1 %
Eosinophils Absolute: 0.1 10*3/uL (ref 0.0–0.5)
Eosinophils Relative: 1 %
HCT: 35.6 % — ABNORMAL LOW (ref 39.0–52.0)
Hemoglobin: 11.2 g/dL — ABNORMAL LOW (ref 13.0–17.0)
Immature Granulocytes: 0 %
Lymphocytes Relative: 16 %
Lymphs Abs: 1.3 10*3/uL (ref 0.7–4.0)
MCH: 30.8 pg (ref 26.0–34.0)
MCHC: 31.5 g/dL (ref 30.0–36.0)
MCV: 97.8 fL (ref 80.0–100.0)
Monocytes Absolute: 0.4 10*3/uL (ref 0.1–1.0)
Monocytes Relative: 5 %
Neutro Abs: 6.3 10*3/uL (ref 1.7–7.7)
Neutrophils Relative %: 77 %
Platelets: 245 10*3/uL (ref 150–400)
RBC: 3.64 MIL/uL — ABNORMAL LOW (ref 4.22–5.81)
RDW: 14.6 % (ref 11.5–15.5)
WBC: 8.1 10*3/uL (ref 4.0–10.5)
nRBC: 0 % (ref 0.0–0.2)

## 2024-04-21 LAB — COMPREHENSIVE METABOLIC PANEL WITH GFR
ALT: 16 U/L (ref 0–44)
AST: 21 U/L (ref 15–41)
Albumin: 3.7 g/dL (ref 3.5–5.0)
Alkaline Phosphatase: 69 U/L (ref 38–126)
Anion gap: 15 (ref 5–15)
BUN: 32 mg/dL — ABNORMAL HIGH (ref 8–23)
CO2: 24 mmol/L (ref 22–32)
Calcium: 10.2 mg/dL (ref 8.9–10.3)
Chloride: 96 mmol/L — ABNORMAL LOW (ref 98–111)
Creatinine, Ser: 0.96 mg/dL (ref 0.61–1.24)
GFR, Estimated: >60 mL/min (ref >60)
Glucose, Bld: 83 mg/dL (ref 70–99)
Potassium: 4.5 mmol/L (ref 3.5–5.1)
Sodium: 135 mmol/L (ref 135–145)
Total Bilirubin: 0.7 mg/dL (ref 0.0–1.2)
Total Protein: 7.9 g/dL (ref 6.5–8.1)

## 2024-04-21 MED ORDER — FENTANYL CITRATE PF 50 MCG/ML IJ SOSY
50.0000 ug | PREFILLED_SYRINGE | Freq: Once | INTRAMUSCULAR | Status: AC
  Administered 2024-04-21: 50 ug via INTRAVENOUS
  Filled 2024-04-21: qty 1

## 2024-04-21 MED ORDER — HYDROMORPHONE HCL 1 MG/ML IJ SOLN
0.5000 mg | INTRAMUSCULAR | Status: DC | PRN
  Administered 2024-04-21: 0.5 mg via INTRAVENOUS
  Filled 2024-04-21: qty 0.5

## 2024-04-21 MED ORDER — ENOXAPARIN SODIUM 40 MG/0.4ML IJ SOSY: 40.0000 mg | PREFILLED_SYRINGE | INTRAMUSCULAR | Status: DC

## 2024-04-21 NOTE — ED Triage Notes (Signed)
 Pt. Stated and wife stated , Brian Dunn had stomach pain since yesterday with nausea. I have pain all the time but this is worse since yesterday with nausea. Pt has a feeding tube. Wife stated its been real dark.

## 2024-04-21 NOTE — Consult Note (Signed)
 Brian Dunn [facility] 11/15/1940  [id number].    Requesting MD: Dorenda Gandy Chief Complaint/Reason for Consult: G-tube catch  HPI: Brian Dunn is a 83 y.o. male who presents to [facility] emergency department for severe pain and leakage around G-tube.  Past Medical History: As mentioned below  ROS: Review of Systems  Constitutional:  Negative for chills and fever.       Cachectic  Cardiovascular:  Negative for chest pain.  Gastrointestinal:  Negative for abdominal pain, nausea and vomiting.  Neurological:        Generalized weakness    Family History  Problem Relation Age of Onset   Heart disease Mother    Liver disease Mother    Parkinsonism Father    Asthma Father    Throat cancer Sister    Liver cancer Sister    Diabetes Sister    Asthma Sister    Liver cancer Sister    Heart disease Sister    Colon cancer Neg Hx     Past Medical History:  Diagnosis Date   Acute bronchitis 05/29/2012   Acute respiratory failure with hypoxia (HCC) 10/01/2023   Allergic rhinitis    Anemia in chronic kidney disease 02/27/2021   Aneurysm of right subclavian artery (HCC) 02/07/2023   Angina pectoris (HCC) 11/07/2020   Angina, class III (HCC) 11/07/2020   Aspiration into airway, initial encounter 10/01/2023   Asthma 06/18/2012   Followed in Pulmonary clinic/ [facility]/ [place]  - 01/09/2018  After extensive coaching inhaler device  effectiveness =   75% > continue symb 160 2bid and return for pfts in 3 months ? RA bronchiolitis?  - PFT's  04/23/2018  FEV1 2.84 (91 % ) ratio 73  p no % improvement from saba p symb 160 prior to study with DLCO  76 % corrects to 79  % for alv volume   - 04/23/2018  After extensive coaching   Benign essential hypertension 06/20/2016   Bilateral inguinal hernia without obstruction or gangrene 06/20/2016   CAD (coronary artery disease), native coronary artery 06/20/2016   Chest pain, unspecified    Chronic ischemic heart disease, unspecified  01/28/2022   Chronic kidney disease 01/28/2022   Closed fracture of part of neck of femur (HCC) 01/22/2007   Annotation: right-sided status post surgery  Qualifier: Diagnosis of   By: Neomi Banks MD, Anitra Ket     IMO SNOMED Dx Update Oct 2024     COPD (chronic obstructive pulmonary disease) (HCC)    Coronary artery disease involving native coronary artery of native heart with angina pectoris (HCC) 06/20/2016   Cough 03/25/2012   Followed in Pulmonary clinic/ [facility]/ [place]    - Trial off acei again  03/25/2012    - Sinus CT 01/24/11 There is mucosal thickening within the paranasal sinuses without  air-fluid levels. Neither ostiomeatal unit is patent.    Cough variant asthma 10/18/2010   Followed in Pulmonary clinic/ [facility]/ [place]  -HFA 75% p coaching 01/22/2011  -PFT's  02/04/2011 minimal airflow obst, nl dlco    Disorder of bone and cartilage 01/22/2007   Qualifier: Diagnosis of  By: Neomi Banks MD, Anitra Ket.   Overview:  Overview:  Qualifier: Diagnosis of  By: Neomi Banks MD, Jose E.   Dysphagia 12/27/2022   Onset ? Aug 2022 p febrile illness ? Asp pna   -  MBS 01/13/23   PO Diet Recommendation: Regular; Thin liquids (Level 0)  Liquid Administration via: Cup; Spoon; Straw  Supervision:  Patient able to self-feed  Postural changes: -- (try reclining when sipping liquids)  Oral care recommendations: Oral care QID (4x/day); Oral care before ice   chips/water   Recommended consults: Consider ENT consultation   Encounter for fitting and adjustment of hearing aid 01/29/2022   Esophageal dysmotility 03/15/2023   Essential hypertension 11/06/2017   GERD 01/22/2007   Annotation: history of esophageal stricture Qualifier: Diagnosis of  By: Neomi Banks MD, Anitra Ket.    GERD (gastroesophageal reflux disease)    GI bleeding 10/01/2023   Gout 01/28/2022   Hip fracture (HCC)    Hyperlipidemia 06/20/2016   Hyperlipidemia with target LDL less than 70 06/20/2016   Hypertension    HYPOGONADISM 02/18/2007   Qualifier: Diagnosis of   By: Neomi Banks MD, Anitra Ket.    Hypogonadism male    Kommerell's diverticulum 03/11/2023   Low back pain 09/09/2013   Multiple lung nodules on CT 11/21/2015   CT [place] 10/17/15 mpns > 15 y since quit smoking > rec 12 m f/u as this is low risk > done 06/04/16 no change rec recheck in 44m  - CT [place] 12/08/17 no change nodules > meets benign criteria > no directed f/u - Quantiferon GOLD TB 01/09/18 neg    Osteopenia    Personal history of malignant neoplasm of prostate    Postoperative visit 08/14/2016   Preop cardiovascular exam 02/26/2023   Protein-calorie malnutrition, severe 03/14/2023   Sensorineural hearing loss, bilateral 01/28/2022   Syncope 03/27/2023   Tremor, essential 02/18/2007   Overview:  Overview:  Qualifier: Diagnosis of  By: Neomi Banks MD, Anitra Ket.  Last Assessment & Plan:  Reviewed tremor may increase on dulera  so will need to be balanced against benefits to cough    Past Surgical History:  Procedure Laterality Date   CAROTID-SUBCLAVIAN BYPASS GRAFT Right 03/11/2023   Procedure: RIGHT SUBCLAVIAN-CAROTID TRANSPOSITION;  Surgeon: Adine Hoof, MD;  Location: [facility] OR;  Service: Vascular;  Laterality: Right;   CORONARY ANGIOPLASTY WITH STENT PLACEMENT  11/04/2006   CORONARY STENT INTERVENTION N/A 11/16/2020   Procedure: CORONARY STENT INTERVENTION;  Surgeon: Arleen Lacer, MD;  Location: [facility] INVASIVE CV LAB;  Service: Cardiovascular;  Laterality: N/A;   EYE SURGERY Bilateral 11/2022   cataract removal   INSERTION OF ILIAC STENT Left 03/11/2023   Procedure: INSERTION OF LEFT ILIAC STENT USING VIABAHN VBX X 54. STENT;  Surgeon: Adine Hoof, MD;  Location: [facility] OR;  Service: Vascular;  Laterality: Left;   INSERTION PROSTATE RADIATION SEED     IR CM INJ ANY COLONIC TUBE W/FLUORO  04/02/2024   IR GASTR TUBE CONVERT GASTR-JEJ PER W/FL MOD SED  03/23/2024   IR REPLC GASTRO/COLONIC TUBE PERCUT W/FLUORO  09/04/2023   IR REPLC GASTRO/COLONIC TUBE PERCUT W/FLUORO   10/01/2023   IR REPLC GASTRO/COLONIC TUBE PERCUT W/FLUORO  10/13/2023   IR REPLC GASTRO/COLONIC TUBE PERCUT W/FLUORO  12/19/2023   IR REPLC GASTRO/COLONIC TUBE PERCUT W/FLUORO  01/13/2024   IR REPLC GASTRO/COLONIC TUBE PERCUT W/FLUORO  01/28/2024   IR REPLC GASTRO/COLONIC TUBE PERCUT W/FLUORO  04/14/2024   LEFT HEART CATH AND CORONARY ANGIOGRAPHY N/A 11/16/2020   Procedure: LEFT HEART CATH AND CORONARY ANGIOGRAPHY;  Surgeon: Arleen Lacer, MD;  Location: [facility] INVASIVE CV LAB;  Service: Cardiovascular;  Laterality: N/A;   PEG PLACEMENT N/A 03/19/2023   Procedure: PERCUTANEOUS ENDOSCOPIC GASTROSTOMY (PEG) PLACEMENT;  Surgeon: Anda Bamberg, MD;  Location: [facility] OR;  Service: General;  Laterality: N/A;   THORACIC AORTIC ENDOVASCULAR STENT GRAFT  Bilateral 03/11/2023   Procedure: THORACIC AORTIC ENDOVASCULAR STENT GRAFT USING ZENITH ALPHA X STENT (COOK);  Surgeon: Adine Hoof, MD;  Location: [facility] OR;  Service: Vascular;  Laterality: Bilateral;   ULTRASOUND GUIDANCE FOR VASCULAR ACCESS Bilateral 03/11/2023   Procedure: ULTRASOUND GUIDANCE FOR VASCULAR ACCESS;  Surgeon: Adine Hoof, MD;  Location: [facility] OR;  Service: Vascular;  Laterality: Bilateral;    Social History:  reports that he quit smoking about 27 years ago. His smoking use included cigarettes. He started smoking about 62 years ago. He has a 35 pack-year smoking history. He has never used smokeless tobacco. He reports that he does not currently use alcohol. He reports that he does not use drugs.  Allergies:  Allergies  Allergen Reactions   Metoprolol  Other (See Comments)    Weight gain   Morphine Rash    (Not in a hospital admission)    Physical Exam: Blood pressure (!) 143/90, pulse 60, temperature 97.8 F (36.6 C), temperature source Oral, resp. rate 18, height 5' 10 (1.778 m), weight 55.8 kg, SpO2 94%. Physical Exam Pulmonary:     Comments: Increased work of breathing, may be baseline for patient?   Hemodynamically stable Abdominal:     General: Abdomen is flat. There is no distension.     Palpations: Abdomen is soft.     Comments: Tenderness around G-tube site   Skin:    General: Skin is warm and dry.   Neurological:     General: No focal deficit present.      No results found for this or any previous visit (from the past 48 hours). No results found.  Anti-infectives (From admission, onward)    None       Assessment/Plan Leakage around G-tube, severe pain  Brian Dunn is a 83 y.o. male who presents to [facility] emergency department for severe pain and leakage around G-tube.  Patient is G-tube dependent, per IR note, all options have been exhausted from their standpoint and needed surgical/wound consult.  Advised patient that the problem with the G-tube leakage may be able to be handled on an outpatient basis.  However, if patient needs to be admitted, patient should be admitted to [facility].  Will discuss plan further with my attending.  FEN - G-tube dependent VTE -SCDs ID - None  Foley - None  Dispo - Admit to [facility].   I reviewed nursing notes, last 24 h vitals and pain scores, last 48 h intake and output, last 24 h labs and trends, and last 24 h imaging results.  This care required moderate level of medical decision making.   Mirta Ammon, PA-C [facility] 04/21/2024, 12:27 PM Please see Amion for pager number during day hours 7:00am-4:30pm

## 2024-04-21 NOTE — Telephone Encounter (Signed)
 Patient is 83 yo male who is G tube dependent, well known to IR.   Patient underwent surgical 24 Fr G tube placement on 03/19/23.   IR interventions: 09/04/23: Patient underwent exchange to a 24 Fr 2.5 cm low profile G tube  10/01/23: Low profile G tube exchange  10/13/23: exchanged to 26 Fr G tube due to leakage 12/19/23: exchanged to 26 Fr mushroom-tip gastrostomy tube 01/13/24: exchanged to 30 Fr G tube due to discomfort  01/28/24: exchange of the 30 Fr G tube due to leakage  03/23/24: exchanged to GJ due to leakage  04/02/24: G limb repositioning, the G limb had migrated past pylorus  04/14/24: exchanged to 30 Fr G tube and pursestring suture placement  Call received from the patient/family today that there is persistent leakage from the G tube.  Discussed with Dr. Marne Sings, afraid that IR has exhausted resources at this time.  Recommend wound care consult and general surgery referral for possible new G tube placement.   Called PCP's office and spoke with Dr. Concepcion Cid RN and informed above recommendations.  Been on hold for 14 minute after speaking with RN. Will fax this note to PCP's office.   Please call IR for questions and concerns.   Rashida Ladouceur H Errick Salts PA-C 04/21/2024 8:33 AM

## 2024-04-21 NOTE — ED Provider Notes (Signed)
 [place] EMERGENCY DEPARTMENT AT [facility] Provider Note   CSN: [id number] Arrival date & time: 04/21/24  4098     Patient presents with: Abdominal Pain and Nausea   Brian Dunn is a 83 y.o. male.   HPI Patient with multiple medical issues including chronic deconditioned status, CAD, PEG tube dependency, vagus nerve injury resulting in nothing per oral now presents with concern for dysfunctional feeding tube.  He is here with his wife.  I spoke with his interventional radiology team today, and with concern for all of his tube feeds escaping from around the tube itself recommendation was for surgery evaluation. Patient has ongoing abdominal pain this is seemingly unchanged.  He has ongoing weakness, this is also seemingly unchanged.  However, the patient is unable to take anything for nutrition or feeds due to the dysfunctional tube.     Prior to Admission medications   Medication Sig Start Date End Date Taking? Authorizing Provider  alfuzosin (UROXATRAL) 10 MG 24 hr tablet Take 10 mg by mouth daily with breakfast. 09/11/23   [provider]  allopurinol  (ZYLOPRIM ) 100 MG tablet Place 100 mg into feeding tube daily. 09/13/13   [provider]  aspirin  81 MG chewable tablet Place 81 mg into feeding tube daily.    [provider]  budesonide -formoterol  (SYMBICORT ) 160-4.5 MCG/ACT inhaler Inhale 2 puffs into the lungs daily.    [provider]  celecoxib  (CELEBREX ) 200 MG capsule Place 200 mg into feeding tube daily.    [provider]  colchicine  0.6 MG tablet Take 0.5 tablets (0.3 mg total) by mouth daily. 10/04/23   Elgergawy, Dawood S, MD  dicyclomine (BENTYL) 10 MG/5ML solution Place 20 mg into feeding tube See admin instructions. Give 10mL via G-tube 3 times a day as needed for abdominal cramping. 06/13/23   [provider]  docusate (COLACE) 50 MG/5ML liquid Place 10 mLs (100 mg total) into feeding tube daily.  03/25/23   Rhyne, Maryanna Smart, PA-C  famotidine  (PEPCID ) 40 MG tablet Place 1 tablet (40 mg total) into feeding tube daily. 03/25/23   Rhyne, Samantha J, PA-C  ferrous sulfate 325 (65 FE) MG EC tablet Take 325 mg by mouth daily.    [provider]  finasteride  (PROSCAR ) 5 MG tablet 5 mg. Place 1 tablet into the feeding tube daily 08/19/23   [provider]  fluticasone  (FLONASE ) 50 MCG/ACT nasal spray Place 2 sprays into the nose daily. 12/01/10   [provider]  gentamicin cream (GARAMYCIN) 0.1 % Apply 1 Application topically 3 (three) times daily.    [provider]  loratadine  (CLARITIN ) 10 MG tablet Place 1 tablet (10 mg total) into feeding tube daily. 03/24/23   Rhyne, Samantha J, PA-C  MAGNESIUM  PO 30 mLs by Feeding Tube route daily.    [provider]  methylPREDNISolone  (MEDROL ) 4 MG TBPK tablet Take by mouth per package directions 04/12/24     metoCLOPramide (REGLAN) 10 MG tablet Take 10 mg by mouth 3 (three) times daily. 03/01/24   [provider]  mirtazapine  (REMERON ) 15 MG tablet Place 15 mg into feeding tube at bedtime.    [provider]  Multiple Vitamin (MULTIVITAMIN) capsule Place 1 capsule into feeding tube daily. 03/24/23   Rhyne, Samantha J, PA-C  nitroGLYCERIN  (NITROSTAT ) 0.4 MG SL tablet Place 1 tablet (0.4 mg total) under the tongue every 5 (five) minutes as needed for chest pain. 01/29/22   Revankar, Micael Adas, MD  Nutritional Supplements (  FEEDING SUPPLEMENT, OSMOLITE 1.5 CAL,) LIQD Place 237 mLs into feeding tube 5 (five) times daily.    [provider]  oxyCODONE  (ROXICODONE ) 5 MG immediate release tablet Take 1 tablet (5 mg total) by mouth every 6 (six) hours as needed for severe pain (pain score 7-10). 03/16/24   Roselynn Connors, PA-C  Oxycodone  HCl 10 MG TABS Take 1 tablet (10 mg total) by mouth every 8 (eight) hours as needed for severe pain 04/12/24     oxyCODONE -acetaminophen  (PERCOCET/ROXICET) 5-325 MG  tablet Take 1 tablet by mouth every 8 (eight) hours as needed for severe pain (pain score 7-10). 02/23/24   Jerri Morale A, FNP  pravastatin  (PRAVACHOL ) 40 MG tablet TAKE 1 TABLET BY MOUTH EVERY DAY 12/24/23   Revankar, Micael Adas, MD  primidone  (MYSOLINE ) 50 MG tablet Place 1 tablet (50 mg total) into feeding tube at bedtime. 03/24/23   Rhyne, Samantha J, PA-C  senna-docusate (SENOKOT-S) 8.6-50 MG tablet Place 1 tablet into feeding tube at bedtime as needed for mild constipation. 03/24/23   Rhyne, Samantha J, PA-C  traMADol  (ULTRAM ) 50 MG tablet Place 1 tablet (50 mg total) into feeding tube every 6 (six) hours as needed for moderate pain. 03/24/23   Rhyne, Samantha J, PA-C    Allergies: Metoprolol  and Morphine    Review of Systems  Updated Vital Signs BP (!) 162/75   Pulse 84   Temp 97.8 F (36.6 C) (Oral)   Resp 19   Ht 5' 10 (1.778 m)   Wt 55.8 kg   SpO2 100%   BMI 17.65 kg/m   Physical Exam Vitals and nursing note reviewed.  Constitutional:      Appearance: He is ill-appearing.  HENT:     Head: Normocephalic and atraumatic.   Eyes:     Conjunctiva/sclera: Conjunctivae normal.    Cardiovascular:     Rate and Rhythm: Normal rate and regular rhythm.  Pulmonary:     Effort: Pulmonary effort is normal. No respiratory distress.     Breath sounds: No stridor.  Abdominal:     General: There is no distension.     Tenderness: There is generalized abdominal tenderness.   Skin:    General: Skin is warm and dry.   Neurological:     Mental Status: He is alert.     Motor: Weakness and atrophy present.     Comments: Very difficult to understand soft spoken voice    (all labs ordered are listed, but only abnormal results are displayed) Labs Reviewed  COMPREHENSIVE METABOLIC PANEL WITH GFR - Abnormal; Notable for the following components:      Result Value   Chloride 96 (*)    BUN 32 (*)    All other components within normal limits  CBC WITH DIFFERENTIAL/PLATELET - Abnormal;  Notable for the following components:   RBC 3.64 (*)    Hemoglobin 11.2 (*)    HCT 35.6 (*)    All other components within normal limits  LIPASE, BLOOD    EKG: None  Radiology: No results found.   Procedures   Medications Ordered in the ED  fentaNYL  (SUBLIMAZE ) injection 50 mcg (50 mcg Intravenous Given 04/21/24 1340)                                    Medical Decision Making Adult male presents with dysfunctional feeding tube.  Patient also has abdominal pain, though this is  likely chronic.  Concern for dehydration, infection, electrolyte abnormalities, dysfunctional tube.  Labs sent, patient monitored. Cardiac 85 sinus normal pulse ox 100% room air normal.  I subsequently discussed the patient's case with interventional radiology and our surgery team.  Interventional radiology recommendation for surgical evaluation as they are consecutive placement of larger and larger tubes with revision his run its course, no indication for additional IR intervention.  Our surgical colleagues will follow the consulting service.  Lab tests generally reassuring, vitals reassuring, no evidence for other acute new phenomenon including bacteremia, sepsis.  Surgery will follow-up with patient in the morning for consideration of his presentation, patient will be admitted to our internal medicine colleagues.  Amount and/or Complexity of Data Reviewed Independent Historian: spouse External Data Reviewed: notes.    Details: Interventional radiology notes from today reviewed Labs: ordered. Decision-making details documented in ED Course. Discussion of management or test interpretation with external provider(s): As above Case discussed with general surgery interventional radiology.  Risk Prescription drug management. Decision regarding hospitalization. Diagnosis or treatment significantly limited by social determinants of health.    Final diagnoses:  Generalized abdominal pain  Complication of  feeding tube [facility])     Dorenda Gandy, MD 04/21/24 1458

## 2024-04-21 NOTE — H&P (Signed)
 Date: 04/21/2024         Patient Name:  Brian Dunn MRN: [id number]  DOB: Nov 04, 1941 Age / Sex: 83 y.o., male   PCP: Beecher Bower, MD         Medical Service: Internal Medicine Teaching Service         Attending Physician: Dr. Sandie Cross, MD    First Contact: Dr. Dr. Fay Hoop, MD Pager [phone] Pager:   Second Contact: Dr. Dr. 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 He recently went to outpatient interventional radiology last week where his G-Tube was examined and demeed to be ok. His wife who is bedside noticed yesterday morning that there was a foul smelling black liquid being released from the G-Tube, and patient was complaining of severe pain in the area, so he came to the emergency department. He denies any falls or any event why it would become dislodged. He denies any chest pain, shortness of breath, fevers or chills.   ROS In HPI  Allergies: Allergies  Allergen Reactions   Metoprolol  Other (See Comments)    Weight gain   Morphine Rash     Past Medical History: Patient Active Problem List   Diagnosis Date Noted   Gastrostomy malfunction (HCC) 04/21/2024   Aspiration into airway, initial encounter 10/01/2023   GI bleeding 10/01/2023   Acute respiratory failure with hypoxia (HCC) 10/01/2023   Syncope 03/27/2023   Esophageal dysmotility 03/15/2023   Protein-calorie malnutrition, severe 03/14/2023   Kommerell's diverticulum 03/11/2023   Preop cardiovascular exam 02/26/2023    Aneurysm of right subclavian artery (HCC) 02/07/2023   Dysphagia 12/27/2022   Encounter for fitting and adjustment of hearing aid 01/29/2022   Chronic kidney disease 01/28/2022   Gout 01/28/2022   Sensorineural hearing loss, bilateral 01/28/2022   Chronic ischemic heart disease, unspecified 01/28/2022   Anemia in chronic kidney disease 02/27/2021   Allergic rhinitis    Hypertension    Angina, class III (HCC) 11/07/2020   Angina pectoris (HCC) 11/07/2020   COPD (chronic obstructive pulmonary disease) (HCC)    GERD (gastroesophageal reflux disease)    Hypogonadism male    Osteopenia    Essential hypertension 11/06/2017   Bilateral inguinal hernia without obstruction or gangrene 06/20/2016   Coronary artery disease involving native coronary artery of native heart with angina pectoris (HCC) 06/20/2016   Hyperlipidemia with target LDL less than 70 06/20/2016   Benign essential hypertension 06/20/2016   CAD (coronary artery disease), native coronary artery 06/20/2016   Hyperlipidemia 06/20/2016   Multiple lung nodules on CT 11/21/2015   Low back pain 09/09/2013   Asthma 06/18/2012   Acute bronchitis 05/29/2012   Cough 03/25/2012   Cough variant asthma 10/18/2010   HYPOGONADISM 02/18/2007   Tremor, essential 02/18/2007   GERD 01/22/2007   Disorder of bone and cartilage 01/22/2007   CHEST PAIN 01/22/2007   Closed fracture of part of neck of femur (HCC) 01/22/2007   HX, PERSONAL, MALIGNANCY, PROSTATE 01/22/2007   Hip fracture (HCC) 01/22/2007   Past Medical  History:  Diagnosis Date   Acute bronchitis 05/29/2012   Acute respiratory failure with hypoxia (HCC) 10/01/2023   Allergic rhinitis    Anemia in chronic kidney disease 02/27/2021   Aneurysm of right subclavian artery (HCC) 02/07/2023   Angina pectoris (HCC) 11/07/2020   Angina, class III (HCC) 11/07/2020   Aspiration into airway, initial encounter 10/01/2023   Asthma 06/18/2012   Followed in Pulmonary clinic/ [facility]/ [facility]  - 01/09/2018  After extensive coaching inhaler device  effectiveness =   75% > continue symb 160 2bid and return for pfts in 3 months ? RA bronchiolitis?  - PFT's  04/23/2018  FEV1 2.84 (91 % ) ratio 73  p no % improvement from saba p symb 160 prior to study with DLCO  76 % corrects to 79  % for alv volume   - 04/23/2018  After extensive coaching   Benign essential hypertension 06/20/2016   Bilateral inguinal hernia without obstruction or gangrene 06/20/2016   CAD (coronary artery disease), native coronary artery 06/20/2016   Chest pain, unspecified    Chronic ischemic heart disease, unspecified 01/28/2022   Chronic kidney disease 01/28/2022   Closed fracture of part of neck of femur (HCC) 01/22/2007   Annotation: right-sided status post surgery  Qualifier: Diagnosis of   By: Neomi Banks MD, Anitra Ket     IMO SNOMED Dx Update Oct 2024     COPD (chronic obstructive pulmonary disease) (HCC)    Coronary artery disease involving native coronary artery of native heart with angina pectoris (HCC) 06/20/2016   Cough 03/25/2012   Followed in Pulmonary clinic/ [facility]/ [facility]    - Trial off acei again  03/25/2012    - Sinus CT 01/24/11 There is mucosal thickening within the paranasal sinuses without  air-fluid levels. Neither ostiomeatal unit is patent.    Cough variant asthma 10/18/2010   Followed in Pulmonary clinic/ [facility]/ [facility]  -HFA 75% p coaching 01/22/2011  -PFT's  02/04/2011 minimal airflow obst, nl dlco    Disorder of bone and cartilage 01/22/2007   Qualifier: Diagnosis of  By: Neomi Banks MD, Anitra Ket.   Overview:  Overview:  Qualifier: Diagnosis of  By: Neomi Banks MD, Jose E.   Dysphagia 12/27/2022   Onset ? Aug 2022 p febrile illness ? Asp pna   -  MBS 01/13/23   PO Diet Recommendation: Regular; Thin liquids (Level 0)  Liquid Administration via: Cup; Spoon; Straw  Supervision: Patient able to self-feed  Postural changes: -- (try reclining when sipping liquids)  Oral care recommendations: Oral  care QID (4x/day); Oral care before ice   chips/water   Recommended consults: Consider ENT consultation   Encounter for fitting and adjustment of hearing aid 01/29/2022   Esophageal dysmotility 03/15/2023   Essential hypertension 11/06/2017   GERD 01/22/2007   Annotation: history of esophageal stricture Qualifier: Diagnosis of  By: Neomi Banks MD, Anitra Ket.    GERD (gastroesophageal reflux disease)    GI bleeding 10/01/2023   Gout 01/28/2022   Hip fracture (HCC)    Hyperlipidemia 06/20/2016   Hyperlipidemia with target LDL less than 70 06/20/2016   Hypertension    HYPOGONADISM 02/18/2007   Qualifier: Diagnosis of  By: Neomi Banks MD, Anitra Ket.    Hypogonadism male    Kommerell's diverticulum 03/11/2023   Low back pain 09/09/2013   Multiple lung nodules on CT 11/21/2015   CT [place] 10/17/15 mpns > 15 y since quit smoking > rec 12 m f/u as this is low risk >  done 06/04/16 no change rec recheck in 54m  - CT [place] 12/08/17 no change nodules > meets benign criteria > no directed f/u - Quantiferon GOLD TB 01/09/18 neg    Osteopenia    Personal history of malignant neoplasm of prostate    Postoperative visit 08/14/2016   Preop cardiovascular exam 02/26/2023   Protein-calorie malnutrition, severe 03/14/2023   Sensorineural hearing loss, bilateral 01/28/2022   Syncope 03/27/2023   Tremor, essential 02/18/2007   Overview:  Overview:  Qualifier: Diagnosis of  By: Neomi Banks MD, Anitra Ket.  Last Assessment & Plan:  Reviewed tremor may increase on dulera  so will need to be balanced against benefits to cough     Medications: All Per Tube   Uroxatral 10mg   Allopurinol  100mg   Emetrol solution  Aspirin  81mg   Symbicort  160-4.5 Celebrex  200mg   Bentyl 10mg   Colace 10mL PRN  Famotidine  40mg   Proscar  5mg   Pravastatin  40mg   Oxycodone  10mg  Q8H PRN Primidone  50mg   Tramadol  50mg  PRN   Surgical History: Past Surgical History:  Procedure Laterality Date   CAROTID-SUBCLAVIAN BYPASS GRAFT Right 03/11/2023   Procedure: RIGHT  SUBCLAVIAN-CAROTID TRANSPOSITION;  Surgeon: Adine Hoof, MD;  Location: [facility] OR;  Service: Vascular;  Laterality: Right;   CORONARY ANGIOPLASTY WITH STENT PLACEMENT  11/04/2006   CORONARY STENT INTERVENTION N/A 11/16/2020   Procedure: CORONARY STENT INTERVENTION;  Surgeon: Arleen Lacer, MD;  Location: [facility] INVASIVE CV LAB;  Service: Cardiovascular;  Laterality: N/A;   EYE SURGERY Bilateral 11/2022   cataract removal   INSERTION OF ILIAC STENT Left 03/11/2023   Procedure: INSERTION OF LEFT ILIAC STENT USING VIABAHN VBX X 54. STENT;  Surgeon: Adine Hoof, MD;  Location: [facility] OR;  Service: Vascular;  Laterality: Left;   INSERTION PROSTATE RADIATION SEED     IR CM INJ ANY COLONIC TUBE W/FLUORO  04/02/2024   IR GASTR TUBE CONVERT GASTR-JEJ PER W/FL MOD SED  03/23/2024   IR REPLC GASTRO/COLONIC TUBE PERCUT W/FLUORO  09/04/2023   IR REPLC GASTRO/COLONIC TUBE PERCUT W/FLUORO  10/01/2023   IR REPLC GASTRO/COLONIC TUBE PERCUT W/FLUORO  10/13/2023   IR REPLC GASTRO/COLONIC TUBE PERCUT W/FLUORO  12/19/2023   IR REPLC GASTRO/COLONIC TUBE PERCUT W/FLUORO  01/13/2024   IR REPLC GASTRO/COLONIC TUBE PERCUT W/FLUORO  01/28/2024   IR REPLC GASTRO/COLONIC TUBE PERCUT W/FLUORO  04/14/2024   LEFT HEART CATH AND CORONARY ANGIOGRAPHY N/A 11/16/2020   Procedure: LEFT HEART CATH AND CORONARY ANGIOGRAPHY;  Surgeon: Arleen Lacer, MD;  Location: [facility] INVASIVE CV LAB;  Service: Cardiovascular;  Laterality: N/A;   PEG PLACEMENT N/A 03/19/2023   Procedure: PERCUTANEOUS ENDOSCOPIC GASTROSTOMY (PEG) PLACEMENT;  Surgeon: Anda Bamberg, MD;  Location: [facility] OR;  Service: General;  Laterality: N/A;   THORACIC AORTIC ENDOVASCULAR STENT GRAFT Bilateral 03/11/2023   Procedure: THORACIC AORTIC ENDOVASCULAR STENT GRAFT USING ZENITH ALPHA X STENT (COOK);  Surgeon: Adine Hoof, MD;  Location: [facility] OR;  Service: Vascular;  Laterality: Bilateral;   ULTRASOUND GUIDANCE FOR VASCULAR ACCESS  Bilateral 03/11/2023   Procedure: ULTRASOUND GUIDANCE FOR VASCULAR ACCESS;  Surgeon: Adine Hoof, MD;  Location[facility] OR;  Service: Vascular;  Laterality: Bilateral;     Family History:  Family History  Problem Relation Age of Onset   Heart disease Mother    Liver disease Mother    Parkinsonism Father    Asthma Father    Throat cancer Sister    Liver cancer Sister    Diabetes Sister    Asthma Sister  Liver cancer Sister    Heart disease Sister    Colon cancer Neg Hx      Social History:  Social History   Socioeconomic History   Marital status: Married    Spouse name: Rose   Number of children: 2   Years of education: Not on file   Highest education level: Not on file  Occupational History   Occupation: retired  Tobacco Use   Smoking status: Former    Current packs/day: 0.00    Average packs/day: 1 pack/day for 35.0 years (35.0 ttl pk-yrs)    Types: Cigarettes    Start date: 11/04/1961    Quit date: 11/04/1996    Years since quitting: 27.4   Smokeless tobacco: Never  Vaping Use   Vaping status: Never Used  Substance and Sexual Activity   Alcohol use: Not Currently   Drug use: Never   Sexual activity: Not Currently  Other Topics Concern   Not on file  Social History Narrative   Married and has children.  Works in Airline pilot.   Social Drivers of Corporate investment banker Strain: Not on file  Food Insecurity: No Food Insecurity (10/01/2023)   Hunger Vital Sign    Worried About Running Out of Food in the Last Year: Never true    Ran Out of Food in the Last Year: Never true  Transportation Needs: No Transportation Needs (10/01/2023)   PRAPARE - Administrator, Civil Service (Medical): No    Lack of Transportation (Non-Medical): No  Physical Activity: Not on file  Stress: No Stress Concern Present (04/14/2023)   [employer] of Occupational Health - Occupational Stress Questionnaire    Feeling of Stress : Only a little  Social  Connections: Not on file  Intimate Partner Violence: Not At Risk (10/01/2023)   Humiliation, Afraid, Rape, and Kick questionnaire    Fear of Current or Ex-Partner: No    Emotionally Abused: No    Physically Abused: No    Sexually Abused: No     Physical Exam:  Blood pressure (!) 162/75, pulse 84, temperature 97.8 F (36.6 C), temperature source Oral, resp. rate 19, height 5' 10 (1.778 m), weight 55.8 kg, SpO2 100%.  Constitutional: Ill appearing, cachcetic male, in pain HENT: normocephalic atraumatic, mucous membranes dry Cardiovascular: regular rate and rhythm, no m/r/g Pulmonary/Chest: normal work of breathing on room air, lungs clear to auscultation bilaterally.  Abdominal: Bandaged G-Tube, dislodged, with foul smelling aroma  Neurological: alert & oriented x 3 MSK: no gross abnormalities. No pitting edema Skin: warm and dry Psych: Normal mood and affect   EKG:  No EKG obtained  Labs:    Latest Ref Rng & Units 04/21/2024   12:07 PM 03/17/2024   10:24 AM 03/16/2024   11:02 AM  BMP  Glucose 70 - 99 mg/dL 83  78  97   BUN 8 - 23 mg/dL 32  27  27   Creatinine 0.61 - 1.24 mg/dL 9.14  7.82  9.56   Sodium 135 - 145 mmol/L 135  134  132   Potassium 3.5 - 5.1 mmol/L 4.5  4.4  4.2   Chloride 98 - 111 mmol/L 96  97  98   CO2 22 - 32 mmol/L 24  23  23    Calcium  8.9 - 10.3 mg/dL 21.3  08.6  9.1        Latest Ref Rng & Units 04/21/2024   12:07 PM 04/14/2024   10:51 AM 03/17/2024  10:24 AM  CBC  WBC 4.0 - 10.5 K/uL 8.1  7.2  6.3   Hemoglobin 13.0 - 17.0 g/dL 96.0  45.4  09.8   Hematocrit 39.0 - 52.0 % 35.6  32.1  31.9   Platelets 150 - 400 K/uL 245  227  236      Social History: Reports that he quit smoking about 27 years ago. His smoking use included cigarettes. He started smoking about 62 years ago. He has a 35 pack-year smoking history. He has never used smokeless tobacco. He reports that he does not currently use alcohol. He reports that he does not use drugs.   Images  and other studies:  Imaging: No results found.    Assessment & Plan:  Kassem Kibbe is a 83 y.o. with past medical history stated above who presents with gastric tube dislodgement and foul smelling discharge  Principal Problem:   Gastrostomy malfunction (HCC)  #Gastrostomy Tube Malfunction #Vagus Nerve Dysfunction Unclear underlying event, but per IR unfortunately there is nothing else to offer on their end. Surgery consulted for potential revision of the tube, and awaiting recommendations as so far no plans for surgical intervention. All of his medications are given through his G-Tube for his chronic conditions and since he is unable to use it will have to hold, as he can't swallow from Vagus nerve dysfunction from right subclavian aneurysm repair.   Plan:  - Follow up surgical recommendations  - Pain control with IV Dilaudid  .5mg  Q6Hr  #Hyperlipidemia  Holding home statin   #CAD Holding aspirin    #Depression/Anxiety Holding home remeron    Will need to restart these once G-Tube is repaired via surgical team       Level of care: Med-Surg Diet: NPO IVF: NA VTE: Holding for now, SCDs Code: DNR-DNI Surrogate: Wife  Signed: Shallen Luedke, MD 04/21/2024, 5:52 PM

## 2024-04-21 NOTE — Progress Notes (Signed)
 Pt arrived from [facility]. Pt transfer from stretcher to bed, change gown and remove pt pants. Family is at bedside right now. Pt did receive pain medication, call button in reach, floor mat by bed.

## 2024-04-21 NOTE — ED Provider Triage Note (Signed)
 Emergency Medicine Provider Triage Evaluation Note  [facility] , a 83 y.o. male  was evaluated in triage.  Pt complains of leakage from G-tube.  Review of Systems  Positive: G-tube leaking Negative: Fever, vomiting  Physical Exam  BP (!) 143/90   Pulse 60   Temp 97.8 F (36.6 C) (Oral)   Resp 18   SpO2 94%  Gen:   Awake, no distress deconditioned appearing thin adult male Resp:  Normal effort  MSK:   Moves extremities without difficulty no deformity Other:  Abdominal G-tube with surrounding discharge  Medical Decision Making  Medically screening exam initiated at 10:22 AM.  Appropriate orders placed.  [facility] was informed that the remainder of the evaluation will be completed by another provider, this initial triage assessment does not replace that evaluation, and the importance of remaining in the ED until their evaluation is complete.   Dorenda Gandy, MD 04/21/24 1022

## 2024-04-21 NOTE — Telephone Encounter (Signed)
 Returned call regarding peg tube, no answer, left vm. AB

## 2024-04-21 NOTE — Hospital Course (Addendum)
 Hospital Course by problem list: #Gastrostomy Tube Malfunction This is a 83 year old male with a history of CAD, hyperlipidemia, depression anxiety.  He also has a vagus nerve dysfunction and has a G-tube placed for nutrition and medicines as he cannot take anything by mouth.  He presented to our hospital due to concerns for foul-smelling discharge around head G-tube site likely secondary to G-tube clog.  General surgery was consulted who recommended replacing the G-tube.  His G-tube was replaced on 6/19.  Surgery recommendation, the newly placed G-tube was not used until 6/20 to ensure it was patent.Patient was discharged home on 6/20 and advised to follow-up with general surgery outpatient.  Patient is also on home chronic pain regimen.  His pain was adequately controlled with IV Dilaudid  during this hospitalization.

## 2024-04-22 ENCOUNTER — Encounter (HOSPITAL_COMMUNITY)
Admission: EM | Disposition: A | Discharge status: Hospice | Payer: Self-pay | Source: Home / Self Care | Attending: Infectious Diseases

## 2024-04-22 ENCOUNTER — Observation Stay (HOSPITAL_COMMUNITY): Admitting: Certified Registered Nurse Anesthetist

## 2024-04-22 ENCOUNTER — Encounter (HOSPITAL_COMMUNITY): Payer: Self-pay | Admitting: Infectious Diseases

## 2024-04-22 ENCOUNTER — Other Ambulatory Visit: Payer: Self-pay

## 2024-04-22 DIAGNOSIS — D649 Anemia, unspecified: Secondary | ICD-10-CM | POA: Diagnosis not present

## 2024-04-22 DIAGNOSIS — Z833 Family history of diabetes mellitus: Secondary | ICD-10-CM | POA: Diagnosis not present

## 2024-04-22 DIAGNOSIS — R64 Cachexia: Secondary | ICD-10-CM | POA: Diagnosis not present

## 2024-04-22 DIAGNOSIS — R0600 Dyspnea, unspecified: Secondary | ICD-10-CM | POA: Diagnosis not present

## 2024-04-22 DIAGNOSIS — I2510 Atherosclerotic heart disease of native coronary artery without angina pectoris: Secondary | ICD-10-CM

## 2024-04-22 DIAGNOSIS — J45909 Unspecified asthma, uncomplicated: Secondary | ICD-10-CM | POA: Diagnosis not present

## 2024-04-22 DIAGNOSIS — J449 Chronic obstructive pulmonary disease, unspecified: Secondary | ICD-10-CM

## 2024-04-22 DIAGNOSIS — Z888 Allergy status to other drugs, medicaments and biological substances status: Secondary | ICD-10-CM | POA: Diagnosis not present

## 2024-04-22 DIAGNOSIS — Y848 Other medical procedures as the cause of abnormal reaction of the patient, or of later complication, without mention of misadventure at the time of the procedure: Secondary | ICD-10-CM | POA: Diagnosis not present

## 2024-04-22 DIAGNOSIS — J4489 Other specified chronic obstructive pulmonary disease: Secondary | ICD-10-CM | POA: Diagnosis not present

## 2024-04-22 DIAGNOSIS — F419 Anxiety disorder, unspecified: Secondary | ICD-10-CM | POA: Diagnosis not present

## 2024-04-22 DIAGNOSIS — Z955 Presence of coronary angioplasty implant and graft: Secondary | ICD-10-CM | POA: Diagnosis not present

## 2024-04-22 DIAGNOSIS — Z7189 Other specified counseling: Secondary | ICD-10-CM | POA: Diagnosis not present

## 2024-04-22 DIAGNOSIS — Z681 Body mass index (BMI) 19 or less, adult: Secondary | ICD-10-CM | POA: Diagnosis not present

## 2024-04-22 DIAGNOSIS — Z7951 Long term (current) use of inhaled steroids: Secondary | ICD-10-CM | POA: Diagnosis not present

## 2024-04-22 DIAGNOSIS — Z515 Encounter for palliative care: Secondary | ICD-10-CM | POA: Diagnosis not present

## 2024-04-22 DIAGNOSIS — K9423 Gastrostomy malfunction: Secondary | ICD-10-CM

## 2024-04-22 DIAGNOSIS — K5730 Diverticulosis of large intestine without perforation or abscess without bleeding: Secondary | ICD-10-CM | POA: Diagnosis not present

## 2024-04-22 DIAGNOSIS — Z8249 Family history of ischemic heart disease and other diseases of the circulatory system: Secondary | ICD-10-CM | POA: Diagnosis not present

## 2024-04-22 DIAGNOSIS — E43 Unspecified severe protein-calorie malnutrition: Secondary | ICD-10-CM | POA: Diagnosis not present

## 2024-04-22 DIAGNOSIS — K9429 Other complications of gastrostomy: Secondary | ICD-10-CM | POA: Diagnosis not present

## 2024-04-22 DIAGNOSIS — E785 Hyperlipidemia, unspecified: Secondary | ICD-10-CM | POA: Diagnosis not present

## 2024-04-22 DIAGNOSIS — R54 Age-related physical debility: Secondary | ICD-10-CM | POA: Diagnosis not present

## 2024-04-22 DIAGNOSIS — G8929 Other chronic pain: Secondary | ICD-10-CM | POA: Diagnosis not present

## 2024-04-22 DIAGNOSIS — F32A Depression, unspecified: Secondary | ICD-10-CM | POA: Diagnosis not present

## 2024-04-22 DIAGNOSIS — R1084 Generalized abdominal pain: Secondary | ICD-10-CM | POA: Diagnosis not present

## 2024-04-22 DIAGNOSIS — I129 Hypertensive chronic kidney disease with stage 1 through stage 4 chronic kidney disease, or unspecified chronic kidney disease: Secondary | ICD-10-CM | POA: Diagnosis not present

## 2024-04-22 DIAGNOSIS — I700 Atherosclerosis of aorta: Secondary | ICD-10-CM | POA: Diagnosis not present

## 2024-04-22 DIAGNOSIS — Z95828 Presence of other vascular implants and grafts: Secondary | ICD-10-CM | POA: Diagnosis not present

## 2024-04-22 DIAGNOSIS — Z87891 Personal history of nicotine dependence: Secondary | ICD-10-CM | POA: Diagnosis not present

## 2024-04-22 DIAGNOSIS — Z885 Allergy status to narcotic agent status: Secondary | ICD-10-CM | POA: Diagnosis not present

## 2024-04-22 DIAGNOSIS — I10 Essential (primary) hypertension: Secondary | ICD-10-CM

## 2024-04-22 DIAGNOSIS — R918 Other nonspecific abnormal finding of lung field: Secondary | ICD-10-CM | POA: Diagnosis not present

## 2024-04-22 DIAGNOSIS — Z66 Do not resuscitate: Secondary | ICD-10-CM | POA: Diagnosis not present

## 2024-04-22 DIAGNOSIS — D631 Anemia in chronic kidney disease: Secondary | ICD-10-CM | POA: Diagnosis not present

## 2024-04-22 DIAGNOSIS — R532 Functional quadriplegia: Secondary | ICD-10-CM | POA: Diagnosis not present

## 2024-04-22 DIAGNOSIS — J690 Pneumonitis due to inhalation of food and vomit: Secondary | ICD-10-CM | POA: Diagnosis not present

## 2024-04-22 HISTORY — PX: LAPAROTOMY: SHX154

## 2024-04-22 LAB — GLUCOSE, CAPILLARY
Glucose-Capillary: 82 mg/dL (ref 70–99)
Glucose-Capillary: 81 mg/dL (ref 70–99)
Glucose-Capillary: 130 mg/dL — ABNORMAL HIGH (ref 70–99)
Glucose-Capillary: 142 mg/dL — ABNORMAL HIGH (ref 70–99)

## 2024-04-22 LAB — TYPE AND SCREEN
ABO/RH(D): O POS
Antibody Screen: NEGATIVE

## 2024-04-22 SURGERY — LAPAROTOMY, EXPLORATORY
Anesthesia: General | Site: Abdomen

## 2024-04-22 MED ORDER — FENTANYL CITRATE (PF) 250 MCG/5ML IJ SOLN
INTRAMUSCULAR | Status: AC
  Filled 2024-04-22: qty 5

## 2024-04-22 MED ORDER — ORAL CARE MOUTH RINSE: 15.0000 mL | Freq: Once | OROMUCOSAL | Status: AC

## 2024-04-22 MED ORDER — ROCURONIUM BROMIDE 10 MG/ML (PF) SYRINGE
PREFILLED_SYRINGE | INTRAVENOUS | Status: DC | PRN
  Administered 2024-04-22: 50 mg via INTRAVENOUS

## 2024-04-22 MED ORDER — PHENYLEPHRINE HCL-NACL 20-0.9 MG/250ML-% IV SOLN
INTRAVENOUS | Status: DC | PRN
  Administered 2024-04-22: 50 ug/min via INTRAVENOUS

## 2024-04-22 MED ORDER — ROCURONIUM BROMIDE 10 MG/ML (PF) SYRINGE
PREFILLED_SYRINGE | INTRAVENOUS | Status: AC
  Filled 2024-04-22: qty 10

## 2024-04-22 MED ORDER — HYDROMORPHONE HCL 1 MG/ML IJ SOLN
1.0000 mg | INTRAMUSCULAR | Status: DC | PRN
  Administered 2024-04-22 – 2024-04-23 (×4): 1 mg via INTRAVENOUS
  Filled 2024-04-22 (×6): qty 1

## 2024-04-22 MED ORDER — SUGAMMADEX SODIUM 200 MG/2ML IV SOLN
INTRAVENOUS | Status: DC | PRN
  Administered 2024-04-22: 110 mg via INTRAVENOUS

## 2024-04-22 MED ORDER — CHLORHEXIDINE GLUCONATE 0.12 % MT SOLN
15.0000 mL | Freq: Once | OROMUCOSAL | Status: AC
  Administered 2024-04-22: 15 mL via OROMUCOSAL
  Filled 2024-04-22: qty 15

## 2024-04-22 MED ORDER — LIDOCAINE 2% (20 MG/ML) 5 ML SYRINGE
INTRAMUSCULAR | Status: AC
Start: 2024-04-22 — End: 2024-04-22
  Filled 2024-04-22: qty 5

## 2024-04-22 MED ORDER — LIDOCAINE 2% (20 MG/ML) 5 ML SYRINGE
INTRAMUSCULAR | Status: DC | PRN
  Administered 2024-04-22: 40 mg via INTRAVENOUS

## 2024-04-22 MED ORDER — ALBUTEROL SULFATE (2.5 MG/3ML) 0.083% IN NEBU: 2.5000 mg | INHALATION_SOLUTION | Freq: Once | RESPIRATORY_TRACT | Status: AC

## 2024-04-22 MED ORDER — DEXAMETHASONE SODIUM PHOSPHATE 10 MG/ML IJ SOLN
INTRAMUSCULAR | Status: DC | PRN
  Administered 2024-04-22: 10 mg via INTRAVENOUS

## 2024-04-22 MED ORDER — 0.9 % SODIUM CHLORIDE (POUR BTL) OPTIME
TOPICAL | Status: DC | PRN
  Administered 2024-04-22: 3000 mL

## 2024-04-22 MED ORDER — FENTANYL CITRATE (PF) 250 MCG/5ML IJ SOLN
INTRAMUSCULAR | Status: DC | PRN
  Administered 2024-04-22: 50 ug via INTRAVENOUS
  Administered 2024-04-22: 25 ug via INTRAVENOUS
  Administered 2024-04-22: 50 ug via INTRAVENOUS

## 2024-04-22 MED ORDER — SODIUM CHLORIDE 0.9 % IV SOLN
1.0000 g | INTRAVENOUS | Status: AC
  Administered 2024-04-22: 1 g via INTRAVENOUS
  Filled 2024-04-22 (×2): qty 1

## 2024-04-22 MED ORDER — PHENYLEPHRINE 80 MCG/ML (10ML) SYRINGE FOR IV PUSH (FOR BLOOD PRESSURE SUPPORT)
PREFILLED_SYRINGE | INTRAVENOUS | Status: DC | PRN
  Administered 2024-04-22 (×2): 160 ug via INTRAVENOUS

## 2024-04-22 MED ORDER — FENTANYL CITRATE (PF) 100 MCG/2ML IJ SOLN
INTRAMUSCULAR | Status: AC
  Filled 2024-04-22: qty 2

## 2024-04-22 MED ORDER — HYDROMORPHONE HCL 1 MG/ML IJ SOLN
1.0000 mg | INTRAMUSCULAR | Status: DC | PRN
  Administered 2024-04-22 (×3): 1 mg via INTRAVENOUS
  Filled 2024-04-22 (×3): qty 1

## 2024-04-22 MED ORDER — LACTATED RINGERS IV SOLN: INTRAVENOUS | Status: DC

## 2024-04-22 MED ORDER — DEXAMETHASONE SODIUM PHOSPHATE 10 MG/ML IJ SOLN
INTRAMUSCULAR | Status: AC
  Filled 2024-04-22: qty 1

## 2024-04-22 MED ORDER — ALBUMIN HUMAN 5 % IV SOLN: INTRAVENOUS | Status: DC | PRN

## 2024-04-22 MED ORDER — DICLOFENAC SODIUM 1 % EX GEL
2.0000 g | Freq: Four times a day (QID) | CUTANEOUS | Status: DC
  Administered 2024-04-22 – 2024-04-24 (×9): 2 g via TOPICAL
  Filled 2024-04-22: qty 100

## 2024-04-22 MED ORDER — PROPOFOL 10 MG/ML IV BOLUS
INTRAVENOUS | Status: DC | PRN
  Administered 2024-04-22: 110 mg via INTRAVENOUS

## 2024-04-22 MED ORDER — LIDOCAINE 5 % EX PTCH
1.0000 | MEDICATED_PATCH | CUTANEOUS | Status: DC
  Administered 2024-04-22 – 2024-04-24 (×3): 1 via TRANSDERMAL
  Filled 2024-04-22 (×3): qty 1

## 2024-04-22 MED ORDER — ONDANSETRON HCL 4 MG/2ML IJ SOLN
INTRAMUSCULAR | Status: AC
  Filled 2024-04-22: qty 2

## 2024-04-22 MED ORDER — ALBUTEROL SULFATE (2.5 MG/3ML) 0.083% IN NEBU
INHALATION_SOLUTION | RESPIRATORY_TRACT | Status: AC
  Administered 2024-04-22: 2.5 mg via RESPIRATORY_TRACT
  Filled 2024-04-22: qty 3

## 2024-04-22 MED ORDER — FENTANYL CITRATE (PF) 100 MCG/2ML IJ SOLN
25.0000 ug | INTRAMUSCULAR | Status: DC | PRN
  Administered 2024-04-22: 50 ug via INTRAVENOUS

## 2024-04-22 MED ORDER — PHENYLEPHRINE 80 MCG/ML (10ML) SYRINGE FOR IV PUSH (FOR BLOOD PRESSURE SUPPORT)
PREFILLED_SYRINGE | INTRAVENOUS | Status: AC
  Filled 2024-04-22: qty 10

## 2024-04-22 MED ORDER — SUCCINYLCHOLINE CHLORIDE 200 MG/10ML IV SOSY
PREFILLED_SYRINGE | INTRAVENOUS | Status: AC
  Filled 2024-04-22: qty 10

## 2024-04-22 MED ORDER — ONDANSETRON HCL 4 MG/2ML IJ SOLN
INTRAMUSCULAR | Status: DC | PRN
  Administered 2024-04-22: 4 mg via INTRAVENOUS

## 2024-04-22 SURGICAL SUPPLY — 46 items
BAG COUNTER SPONGE SURGICOUNT (BAG) ×2 IMPLANT
BAG DRAINAGE 1000ML ENFIT (BAG) IMPLANT
BLADE CLIPPER SURG (BLADE) IMPLANT
BNDG GAUZE DERMACEA FLUFF 4 (GAUZE/BANDAGES/DRESSINGS) IMPLANT
CANISTER SUCTION 3000ML PPV (SUCTIONS) ×2 IMPLANT
CHLORAPREP W/TINT 26 (MISCELLANEOUS) ×2 IMPLANT
COVER SURGICAL LIGHT HANDLE (MISCELLANEOUS) ×2 IMPLANT
DRAPE LAPAROSCOPIC ABDOMINAL (DRAPES) ×2 IMPLANT
DRAPE WARM FLUID 44X44 (DRAPES) ×2 IMPLANT
DRSG OPSITE POSTOP 4X10 (GAUZE/BANDAGES/DRESSINGS) IMPLANT
DRSG OPSITE POSTOP 4X8 (GAUZE/BANDAGES/DRESSINGS) IMPLANT
ELECT BLADE 6.5 EXT (BLADE) IMPLANT
ELECT CAUTERY BLADE 6.4 (BLADE) IMPLANT
ELECTRODE REM PT RTRN 9FT ADLT (ELECTROSURGICAL) ×2 IMPLANT
G-TUBE MIC BOLUS 16FR ENFIT (TUBING) IMPLANT
GAUZE SPONGE 4X4 12PLY STRL LF (GAUZE/BANDAGES/DRESSINGS) IMPLANT
GLOVE BIO SURGEON STRL SZ7 (GLOVE) ×2 IMPLANT
GLOVE BIOGEL PI IND STRL 7.5 (GLOVE) ×2 IMPLANT
GOWN STRL REUS W/ TWL LRG LVL3 (GOWN DISPOSABLE) ×4 IMPLANT
HANDLE SUCTION POOLE (INSTRUMENTS) ×2 IMPLANT
KIT BASIN OR (CUSTOM PROCEDURE TRAY) ×2 IMPLANT
KIT TURNOVER KIT B (KITS) ×2 IMPLANT
LIGASURE IMPACT 36 18CM CVD LR (INSTRUMENTS) IMPLANT
NS IRRIG 1000ML POUR BTL (IV SOLUTION) ×4 IMPLANT
PACK GENERAL/GYN (CUSTOM PROCEDURE TRAY) ×2 IMPLANT
PAD ARMBOARD POSITIONER FOAM (MISCELLANEOUS) ×2 IMPLANT
PENCIL SMOKE EVACUATOR (MISCELLANEOUS) ×2 IMPLANT
RELOAD PROXIMATE 75MM GREEN (ENDOMECHANICALS) ×1 IMPLANT
RELOAD STAPLE 75 4.5 GRN THCK (ENDOMECHANICALS) IMPLANT
SPECIMEN JAR LARGE (MISCELLANEOUS) IMPLANT
SPONGE T-LAP 18X18 ~~LOC~~+RFID (SPONGE) IMPLANT
STAPLER PROXIMATE 75MM BLUE (STAPLE) IMPLANT
STAPLER SKIN PROX 35W (STAPLE) ×2 IMPLANT
SUT ETHILON 2 0 FS 18 (SUTURE) IMPLANT
SUT NOVA 1 T20/GS 25DT (SUTURE) IMPLANT
SUT PDS AB 1 TP1 96 (SUTURE) ×4 IMPLANT
SUT SILK 2 0 SH CR/8 (SUTURE) ×2 IMPLANT
SUT SILK 2-0 18XBRD TIE 12 (SUTURE) ×2 IMPLANT
SUT SILK 3 0 SH CR/8 (SUTURE) ×2 IMPLANT
SUT SILK 3-0 18XBRD TIE 12 (SUTURE) ×2 IMPLANT
SUT VIC AB 3-0 SH 8-18 (SUTURE) ×2 IMPLANT
TOWEL GREEN STERILE (TOWEL DISPOSABLE) ×2 IMPLANT
TRAY FOLEY MTR SLVR 16FR STAT (SET/KITS/TRAYS/PACK) ×2 IMPLANT
TUBE GASTRO BOLUS ENFIT 16 FR (TUBING) ×1 IMPLANT
TUBE MOSS GAS 18FR (TUBING) IMPLANT
YANKAUER SUCT BULB TIP NO VENT (SUCTIONS) IMPLANT

## 2024-04-22 NOTE — Anesthesia Preprocedure Evaluation (Addendum)
 Anesthesia Evaluation  Patient identified by MRN, date of birth, ID band Patient awake    Reviewed: Allergy & Precautions, NPO status , Patient's Chart, lab work & pertinent test results  Airway Mallampati: II  TM Distance: >3 FB Neck ROM: Full    Dental  (+) Teeth Intact   Pulmonary asthma , COPD, former smoker    + decreased breath sounds      Cardiovascular hypertension, + CAD and + Cardiac Stents   Rhythm:Regular Rate:Normal  Echo:  1. Left ventricular ejection fraction, by estimation, is 50 to 55%. The  left ventricle has low normal function. The left ventricle has no regional  wall motion abnormalities. Left ventricular diastolic parameters were  normal.   2. Right ventricular systolic function is normal. The right ventricular  size is normal.   3. The mitral valve is normal in structure. No evidence of mitral valve  regurgitation. No evidence of mitral stenosis.   4. The aortic valve is normal in structure. Aortic valve regurgitation is  not visualized. No aortic stenosis is present.   5. There is borderline dilatation of the ascending aorta, measuring 39  mm.   6. The inferior vena cava is dilated in size with >50% respiratory  variability, suggesting right atrial pressure of 8 mmHg.     Neuro/Psych negative neurological ROS  negative psych ROS   GI/Hepatic Neg liver ROS,GERD  Medicated,,  Endo/Other  negative endocrine ROS    Renal/GU Renal disease     Musculoskeletal negative musculoskeletal ROS (+)    Abdominal   Peds  Hematology  (+) Blood dyscrasia, anemia   Anesthesia Other Findings   Reproductive/Obstetrics                             Anesthesia Physical Anesthesia Plan  ASA: 3  Anesthesia Plan: General   Post-op Pain Management: Ofirmev  IV (intra-op)*   Induction: Intravenous  PONV Risk Score and Plan: 3 and Ondansetron , Dexamethasone  and Treatment may vary  due to age or medical condition  Airway Management Planned: Oral ETT  Additional Equipment: None  Intra-op Plan:   Post-operative Plan: Extubation in OR  Informed Consent: I have reviewed the patients History and Physical, chart, labs and discussed the procedure including the risks, benefits and alternatives for the proposed anesthesia with the patient or authorized representative who has indicated his/her understanding and acceptance.     Dental advisory given  Plan Discussed with: CRNA  Anesthesia Plan Comments:        Anesthesia Quick Evaluation

## 2024-04-22 NOTE — Progress Notes (Signed)
Pt transported to short stay via bed by transportation staff 

## 2024-04-22 NOTE — Op Note (Signed)
 Preoperative diagnosis: Failed radiologic gastrostomy tube, gastrostomy tube dependent Postoperative diagnosis: Same as above Procedure: Exploratory laparotomy with removal of old gastrostomy tube and gastric wedge resection, new gastrostomy tube placement Surgeon: Dr. Donavan Fuchs Assistant: Marlin Simmonds, PA-C Estimated blood loss: Less than 50 cc Drains: None Specimens: None Anesthesia: General Complications: None Sponge count was correct completion Disposition recovery stable  Indications: this is an 83 year old male who has become G-tube dependence due to a vagal nerve injury.  Interventional radiology has been placed NG tube and he continues to have issues with this.  He has a very large wound and the G-tube is leaking and not able to be used appropriately.  I discussed I think the only option at this point would be to go to the operating room resect this hole remove the old G-tube and place a new one.  Procedure: After informed consent was obtained he was taken to the operating room.  I discussed this with he and his wife prior to beginning.  Antibiotics were given.  SCDs and were in place.  He was placed under general anesthesia without complication.  He was then prepped and draped in the standard sterile surgical fashion.  Surgical timeout was then performed.  I removed his old G-tube prior to beginning.  There was a about a 2 cm hole right at his xiphoid process from this.  I then made an incision below this and entered into his abdomen without difficulty.  I then bovied across the stomach as it entered into the tract.  I brought this piece up.  I then used the stapler to do a wedge resection of this old hole.  I oversewed several small bleeding vessels with 3-0 silk sutures.  I then connected my incision to the tract.  I debrided the entire tract of any nonviable material.  I then picked a point on his stomach and placed two 2-0 silk sutures in a pursestring fashion.  I then made a hole  in the left upper quadrant and really the only position I could place the need new G-tube due to his old hold skin excoriation as well as his habitus.  I then brought a 16 [place] Moss tube through this hole.  I made a gastrotomy and entered into the stomach.  I then placed a tube in this and blew the balloon up.  I tied my pursestrings down.  I then brought the stomach up to the abdominal wall.  I did a Stamm gastrostomy and tacked the stomach to the abdominal wall with 2-0 silk suture.  This was in good position upon completion.  Hemostasis was obtained.  Then closed the abdomen with #1 Novafil's in an interrupted fashion.  I closed the bottom portion with staples.  The place where the G-tube had previously been I left open will do dressing changes.  He tolerated this well and was transferred to recovery.

## 2024-04-22 NOTE — Anesthesia Postprocedure Evaluation (Addendum)
 Anesthesia Post Note  Patient: Genette Kent Wasko  Procedure(s) Performed: LAPAROTOMY, EXPLORATORY (Abdomen)     Patient location during evaluation: PACU Anesthesia Type: General Level of consciousness: awake and alert Pain management: pain level controlled Vital Signs Assessment: post-procedure vital signs reviewed and stable Respiratory status: spontaneous breathing, nonlabored ventilation, respiratory function stable and patient connected to nasal cannula oxygen  Cardiovascular status: blood pressure returned to baseline and stable Postop Assessment: no apparent nausea or vomiting Anesthetic complications: no  No notable events documented.  Last Vitals:  Vitals:   04/22/24 1200 04/22/24 1215  BP: (!) 159/72 (!) 151/77  Pulse: 96 94  Resp: 14 16  Temp:    SpO2: (!) 89% 94%                 Willian Harrow

## 2024-04-22 NOTE — Progress Notes (Signed)
   04/22/24 0200  Provider Notification  Provider Name/Title M Zheng,DO  Date Provider Notified 04/22/24  Time Provider Notified 0200  Method of Notification Page  Notification Reason Other (Comment) (Patient has no orders and is requesting for Lidoderm  patch,Blood sugar 82,any recommendation for fluids- On call provider,M Jari Merles notified)  Provider response See new orders  Date of Provider Response 04/22/24  Time of Provider Response [phone]

## 2024-04-22 NOTE — Progress Notes (Signed)
   HD#0 SUBJECTIVE:  Patient Summary: 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 General surgery is planning a G-T tube placement today   Overnight Events: Admitted   Interim History:  Today patient was seen at bedside. He didn't have any new complaints other than his chronic pain for which we are treating with Dilaudid .  He is aware of his pending surgery.  OBJECTIVE:  Vital Signs: Vitals:   04/22/24 0036 04/22/24 0457 04/22/24 0926 04/22/24 0945  BP: (!) 153/81 122/70 (!) 141/66 (P) 135/72  Pulse: 88 81 83 (P) 83  Resp:   18 (P) 18  Temp: 97.8 F (36.6 C) 98 F (36.7 C) 98.4 F (36.9 C) (P) 98.3 F (36.8 C)  TempSrc: Oral Oral Oral (P) Oral  SpO2: 97% 92% 96% (P) 92%  Weight:    (P) 55.8 kg  Height:    (P) 5' 10 (1.778 m)   Supplemental O2: Room Air SpO2: (P) 92 %  Filed Weights   04/21/24 1024 04/22/24 0945  Weight: 55.8 kg (P) 55.8 kg     Intake/Output Summary (Last 24 hours) at 04/22/2024 1006 Last data filed at 04/22/2024 0500 Gross per 24 hour  Intake --  Output 200 ml  Net -200 ml   Net IO Since Admission: -200 mL [04/22/24 1006]  Physical Exam: Constitutional:Chronically ill-appearing man.  Cardio:Regular rate and rhythm.  Pulm: Normal work of breathing on room air. MSK: Minimal drainage from G-tube site, dressing intact, no surrounding erythema noted Skin:Warm and dry. Neuro:Alert and oriented x3.  Patient Lines/Drains/Airways Status     Active Line/Drains/Airways     Name Placement date Placement time Site Days   Peripheral IV 04/21/24 20 G Left Antecubital 04/21/24  1207  Antecubital  1   Gastrostomy/Enterostomy Other (Comment);Gastrostomy 30 Fr. LUQ 04/14/24  1404  LUQ  8              ASSESSMENT/PLAN:  Assessment: Principal Problem:   Gastrostomy malfunction (HCC)   Plan: #Gastrostomy Tube Malfunction #Vagus Nerve Dysfunction - G-tube is without any significant drainage - Ex lap with partial gastrectomy and G-tube placement today -  Follow-up postop recs - Optimize  pain control with IV Dilaudid  - Appreciate General surgery recs    #Hyperlipidemia  Holding home statin until post surgery as he gets these meds through his G-tube    #CAD Holding aspirin  until post surgery as he gets these meds through his G-tube    #Depression/Anxiety Holding home remeron  until post surgery as he gets these meds through his G-tube   Will start all home meds post op.   Best Practice: VTE: SCDs Code: DNR/DNI Family Contact: Wife , called and notified. DISPO: Anticipated discharge tomorrow to Home pending Surgery and medical management .  Signature: Fay Hoop , MD Internal Medicine Resident, PGY-1 [facility]  Pager: [phone] 10:06 AM, 04/22/2024   Please contact the on call pager after 5 pm and on weekends at [phone].

## 2024-04-22 NOTE — Progress Notes (Signed)
 Subjective: Pain around g-tube unchanged.  No other complaints  ROS: See above, otherwise other systems negative  Objective: Vital signs in last 24 hours: Temp:  [97.3 F (36.3 C)-98.4 F (36.9 C)] 98 F (36.7 C) (06/19 0457) Pulse Rate:  [60-89] 81 (06/19 0457) Resp:  [16-22] 16 (06/18 1833) BP: (122-162)/(68-90) 122/70 (06/19 0457) SpO2:  [92 %-100 %] 92 % (06/19 0457) Weight:  [55.8 kg] 55.8 kg (06/18 1024) Last BM Date : 04/21/24  Intake/Output from previous day: 06/18 0701 - 06/19 0700 In: -  Out: 200 [Urine:200] Intake/Output this shift: No intake/output data recorded.  PE: Gen: frail appearing Abd: soft, tender as expected around g-tube, large 27F g-tube in place  Lab Results:  Recent Labs    04/21/24 1207  WBC 8.1  HGB 11.2*  HCT 35.6*  PLT 245   BMET Recent Labs    04/21/24 1207  NA 135  K 4.5  CL 96*  CO2 24  GLUCOSE 83  BUN 32*  CREATININE 0.96  CALCIUM  10.2   PT/INR No results for input(s): LABPROT, INR in the last 72 hours. CMP     Component Value Date/Time   NA 135 04/21/2024 1207   NA 136 02/07/2023 1007   K 4.5 04/21/2024 1207   CL 96 (L) 04/21/2024 1207   CO2 24 04/21/2024 1207   GLUCOSE 83 04/21/2024 1207   BUN 32 (H) 04/21/2024 1207   BUN 27 02/07/2023 1007   CREATININE 0.96 04/21/2024 1207   CREATININE 0.92 03/17/2024 1024   CALCIUM  10.2 04/21/2024 1207   PROT 7.9 04/21/2024 1207   PROT 7.2 02/07/2023 1007   ALBUMIN 3.7 04/21/2024 1207   ALBUMIN 4.2 02/07/2023 1007   AST 21 04/21/2024 1207   AST 24 03/17/2024 1024   ALT 16 04/21/2024 1207   ALT 14 03/17/2024 1024   ALKPHOS 69 04/21/2024 1207   BILITOT 0.7 04/21/2024 1207   BILITOT 0.4 03/17/2024 1024   GFRNONAA >60 04/21/2024 1207   GFRNONAA >60 03/17/2024 1024   GFRAA 76 11/07/2020 1442   Lipase     Component Value Date/Time   LIPASE 27 04/21/2024 1207       Studies/Results: No results found.  Anti-infectives: Anti-infectives (From  admission, onward)    Start     Dose/Rate Route Frequency Ordered Stop   04/22/24 0845  cefoTEtan (CEFOTAN) 1 g in sodium chloride  0.9 % 100 mL IVPB        1 g 200 mL/hr over 30 Minutes Intravenous On call to O.R. 04/22/24 0747 04/23/24 0559        Assessment/Plan Malfunction of existing gastrostomy tube -discussed option with patient to go to OR today for a laparotomy with removal of his old g-tube with closure (resection) of that small piece of stomach and Stamm placement of a new g-tube.  We would leave the abdominal wall wound open and do local wound care. -we discussed risks/post op course such as gastric leak, abdominal wound would be left open, leaking around new g-tube etc.  He understood and was agreeable to proceed. -Cefotetan on call to OR   FEN - NPO VTE - ok for chemical prohylaxis ID - cefotetan on call to OR  I reviewed nursing notes, hospitalist notes, last 24 h vitals and pain scores, last 48 h intake and output, last 24 h labs and trends, and last 24 h imaging results.   LOS: 0 days    Brian Dunn , PA-C [facility] 04/22/2024, 7:48 AM Please see Amion for pager number during day hours 7:00am-4:30pm or 7:00am -11:30am on weekends

## 2024-04-22 NOTE — Transfer of Care (Signed)
 Immediate Anesthesia Transfer of Care Note  Patient: Brian Dunn  Procedure(s) Performed: LAPAROTOMY, EXPLORATORY (Abdomen)  Patient Location: PACU  Anesthesia Type:General  Level of Consciousness: awake, alert , and oriented  Airway & Oxygen  Therapy: Patient Spontanous Breathing and Patient connected to face mask oxygen   Post-op Assessment: Report given to RN and Post -op Vital signs reviewed and stable  Post vital signs: Reviewed and stable  Last Vitals:  Vitals Value Taken Time  BP 179/82 04/22/24 11:45  Temp    Pulse 98 04/22/24 11:44  Resp 21 04/22/24 11:45  SpO2 100 % 04/22/24 11:44  Vitals shown include unfiled device data.  Last Pain:  Vitals:   04/22/24 0948  TempSrc:   PainSc: 5       Patients Stated Pain Goal: 0 (04/22/24 0544)  Complications: No notable events documented.

## 2024-04-22 NOTE — Plan of Care (Signed)
  Problem: Clinical Measurements: Goal: Will remain free from infection Outcome: Progressing   Problem: Clinical Measurements: Goal: Respiratory complications will improve Outcome: Progressing   Problem: Clinical Measurements: Goal: Cardiovascular complication will be avoided Outcome: Progressing   Problem: Activity: Goal: Risk for activity intolerance will decrease Outcome: Progressing   Problem: Coping: Goal: Level of anxiety will decrease Outcome: Progressing   Problem: Elimination: Goal: Will not experience complications related to urinary retention Outcome: Progressing   Problem: Pain Managment: Goal: General experience of comfort will improve and/or be controlled Outcome: Progressing   Problem: Safety: Goal: Ability to remain free from injury will improve Outcome: Progressing   Problem: Skin Integrity: Goal: Risk for impaired skin integrity will decrease Outcome: Progressing

## 2024-04-22 NOTE — Progress Notes (Signed)
 Consent signed and in chart.

## 2024-04-23 ENCOUNTER — Encounter (HOSPITAL_COMMUNITY): Payer: Self-pay | Admitting: General Surgery

## 2024-04-23 ENCOUNTER — Inpatient Hospital Stay (HOSPITAL_COMMUNITY)

## 2024-04-23 LAB — CBC
HCT: 35.6 % — ABNORMAL LOW (ref 39.0–52.0)
Hemoglobin: 11.3 g/dL — ABNORMAL LOW (ref 13.0–17.0)
MCH: 30.4 pg (ref 26.0–34.0)
MCHC: 31.7 g/dL (ref 30.0–36.0)
MCV: 95.7 fL (ref 80.0–100.0)
Platelets: 267 10*3/uL (ref 150–400)
RBC: 3.72 MIL/uL — ABNORMAL LOW (ref 4.22–5.81)
RDW: 14.7 % (ref 11.5–15.5)
WBC: 20.1 10*3/uL — ABNORMAL HIGH (ref 4.0–10.5)
nRBC: 0 % (ref 0.0–0.2)

## 2024-04-23 LAB — BASIC METABOLIC PANEL WITH GFR
Anion gap: 14 (ref 5–15)
BUN: 38 mg/dL — ABNORMAL HIGH (ref 8–23)
CO2: 26 mmol/L (ref 22–32)
Calcium: 10.1 mg/dL (ref 8.9–10.3)
Chloride: 101 mmol/L (ref 98–111)
Creatinine, Ser: 0.94 mg/dL (ref 0.61–1.24)
GFR, Estimated: >60 mL/min (ref >60)
Glucose, Bld: 115 mg/dL — ABNORMAL HIGH (ref 70–99)
Potassium: 4.4 mmol/L (ref 3.5–5.1)
Sodium: 141 mmol/L (ref 135–145)

## 2024-04-23 LAB — GLUCOSE, CAPILLARY
Glucose-Capillary: 120 mg/dL — ABNORMAL HIGH (ref 70–99)
Glucose-Capillary: 120 mg/dL — ABNORMAL HIGH (ref 70–99)
Glucose-Capillary: 120 mg/dL — ABNORMAL HIGH (ref 70–99)
Glucose-Capillary: 124 mg/dL — ABNORMAL HIGH (ref 70–99)
Glucose-Capillary: 136 mg/dL — ABNORMAL HIGH (ref 70–99)
Glucose-Capillary: 195 mg/dL — ABNORMAL HIGH (ref 70–99)
Glucose-Capillary: 199 mg/dL — ABNORMAL HIGH (ref 70–99)

## 2024-04-23 MED ORDER — EMETROL 1.87-1.87-21.5 PO SOLN: 10.0000 mL | Freq: Three times a day (TID) | ORAL | Status: DC | PRN

## 2024-04-23 MED ORDER — PRAVASTATIN SODIUM 40 MG PO TABS
40.0000 mg | ORAL_TABLET | Freq: Every day | ORAL | Status: DC
  Administered 2024-04-23 – 2024-04-24 (×2): 40 mg
  Filled 2024-04-23 (×2): qty 1

## 2024-04-23 MED ORDER — FAMOTIDINE 20 MG PO TABS
40.0000 mg | ORAL_TABLET | Freq: Every morning | ORAL | Status: DC
  Administered 2024-04-24: 40 mg
  Filled 2024-04-23: qty 2

## 2024-04-23 MED ORDER — POLYETHYLENE GLYCOL 3350 17 G PO PACK
17.0000 g | PACK | Freq: Every day | ORAL | Status: DC
  Administered 2024-04-23: 17 g via ORAL
  Filled 2024-04-23: qty 1

## 2024-04-23 MED ORDER — ALLOPURINOL 100 MG PO TABS
100.0000 mg | ORAL_TABLET | Freq: Every day | ORAL | Status: DC
  Administered 2024-04-23: 100 mg
  Filled 2024-04-23: qty 1

## 2024-04-23 MED ORDER — METOCLOPRAMIDE HCL 5 MG PO TABS
10.0000 mg | ORAL_TABLET | Freq: Three times a day (TID) | ORAL | Status: DC
  Administered 2024-04-23 – 2024-04-24 (×2): 10 mg
  Filled 2024-04-23 (×2): qty 2

## 2024-04-23 MED ORDER — ASPIRIN 81 MG PO CHEW
81.0000 mg | CHEWABLE_TABLET | Freq: Every day | ORAL | Status: DC
  Administered 2024-04-23: 81 mg
  Filled 2024-04-23: qty 1

## 2024-04-23 MED ORDER — METOCLOPRAMIDE HCL 5 MG PO TABS
10.0000 mg | ORAL_TABLET | Freq: Three times a day (TID) | ORAL | Status: DC
  Administered 2024-04-23: 10 mg via ORAL
  Filled 2024-04-23: qty 2

## 2024-04-23 MED ORDER — OXYCODONE HCL 5 MG PO TABS: 15.0000 mg | ORAL_TABLET | Freq: Three times a day (TID) | ORAL | Status: DC | PRN

## 2024-04-23 MED ORDER — OXYCODONE HCL 10 MG PO TABS: 10.0000 mg | ORAL_TABLET | Freq: Three times a day (TID) | ORAL | Status: DC | PRN

## 2024-04-23 MED ORDER — FREE WATER
140.0000 mL | Status: DC
  Administered 2024-04-23 – 2024-04-24 (×5): 140 mL

## 2024-04-23 MED ORDER — SENNOSIDES-DOCUSATE SODIUM 8.6-50 MG PO TABS
1.0000 | ORAL_TABLET | Freq: Every day | ORAL | Status: DC
  Administered 2024-04-23: 1
  Filled 2024-04-23: qty 1

## 2024-04-23 MED ORDER — OXYCODONE HCL 5 MG PO TABS
15.0000 mg | ORAL_TABLET | Freq: Three times a day (TID) | ORAL | Status: DC
  Administered 2024-04-23 – 2024-04-26 (×9): 15 mg
  Filled 2024-04-23 (×9): qty 3

## 2024-04-23 MED ORDER — OSMOLITE 1.5 CAL PO LIQD
1000.0000 mL | ORAL | Status: DC
  Administered 2024-04-23: 1000 mL
  Filled 2024-04-23: qty 1000

## 2024-04-23 MED ORDER — CELECOXIB 200 MG PO CAPS
200.0000 mg | ORAL_CAPSULE | Freq: Every morning | ORAL | Status: DC
  Administered 2024-04-24: 200 mg
  Filled 2024-04-23: qty 1

## 2024-04-23 MED ORDER — GENTAMICIN SULFATE 0.1 % EX CREA
1.0000 | TOPICAL_CREAM | Freq: Three times a day (TID) | CUTANEOUS | Status: DC
  Administered 2024-04-23 – 2024-04-24 (×3): 1 via TOPICAL
  Filled 2024-04-23: qty 15

## 2024-04-23 MED ORDER — THIAMINE MONONITRATE 100 MG PO TABS
100.0000 mg | ORAL_TABLET | Freq: Every day | ORAL | Status: DC
  Administered 2024-04-23 – 2024-04-24 (×2): 100 mg
  Filled 2024-04-23 (×2): qty 1

## 2024-04-23 MED ORDER — OSMOLITE 1.5 CAL PO LIQD
237.0000 mL | Freq: Every day | ORAL | Status: DC
  Administered 2024-04-23: 237 mL
  Filled 2024-04-23: qty 237

## 2024-04-23 MED ORDER — RIVAROXABAN 10 MG PO TABS
10.0000 mg | ORAL_TABLET | Freq: Every day | ORAL | Status: DC
  Administered 2024-04-24: 10 mg
  Filled 2024-04-23: qty 1

## 2024-04-23 MED ORDER — COLCHICINE 0.6 MG PO TABS
0.6000 mg | ORAL_TABLET | Freq: Every morning | ORAL | Status: DC
  Administered 2024-04-24: 0.6 mg
  Filled 2024-04-23: qty 1

## 2024-04-23 MED ORDER — OXYCODONE HCL 5 MG PO TABS
10.0000 mg | ORAL_TABLET | Freq: Three times a day (TID) | ORAL | Status: DC | PRN
  Administered 2024-04-23: 10 mg
  Filled 2024-04-23: qty 2

## 2024-04-23 MED ORDER — LACTATED RINGERS IV BOLUS
1000.0000 mL | Freq: Once | INTRAVENOUS | Status: AC
  Administered 2024-04-23: 1000 mL via INTRAVENOUS

## 2024-04-23 MED ORDER — RIVAROXABAN 10 MG PO TABS
10.0000 mg | ORAL_TABLET | Freq: Every day | ORAL | Status: DC
  Administered 2024-04-23: 10 mg via ORAL
  Filled 2024-04-23: qty 1

## 2024-04-23 MED ORDER — HYDROMORPHONE HCL 1 MG/ML IJ SOLN
1.0000 mg | INTRAMUSCULAR | Status: DC | PRN
  Administered 2024-04-23 – 2024-04-24 (×4): 1 mg via INTRAVENOUS
  Filled 2024-04-23 (×3): qty 1

## 2024-04-23 MED ORDER — POLYETHYLENE GLYCOL 3350 17 G PO PACK
17.0000 g | PACK | Freq: Every day | ORAL | Status: DC
  Administered 2024-04-24: 17 g
  Filled 2024-04-23: qty 1

## 2024-04-23 MED ORDER — IOHEXOL 350 MG/ML SOLN
75.0000 mL | Freq: Once | INTRAVENOUS | Status: AC | PRN
  Administered 2024-04-23: 75 mL via INTRAVENOUS

## 2024-04-23 NOTE — Discharge Instructions (Signed)
WOUND CARE: - midline dressing to be changed daily - supplies: sterile saline, gauze, scissors, tape  - remove dressing and all packing carefully, moistening with sterile saline as needed to avoid packing/internal dressing sticking to the wound. - clean edges of skin around the wound with water/gauze, making sure there is no tape debris or leakage left on skin that could cause skin irritation or breakdown. - dampen and clean gauze with sterile saline and pack wound from wound base to skin level, making sure to take note of any possible areas of wound tracking, tunneling and packing appropriately. Wound can be packed loosely. Trim gauze to size if a whole gauze is not required. - cover wound with a dry gauze and secure with tape.  - write the date/time on the dry dressing/tape to better track when the last dressing change occurred. - change dressing as needed if leakage occurs, wound gets contaminated, or patient requests to shower. - patient may shower daily with wound open (i.e. remove all packing) and following the shower the wound should be dried and a clean dressing placed.

## 2024-04-23 NOTE — Progress Notes (Signed)
 1 Day Post-Op   Subjective/Chief Complaint: sore   Objective: Vital signs in last 24 hours: Temp:  [97.8 F (36.6 C)-98.6 F (37 C)] 98 F (36.7 C) (06/20 0758) Pulse Rate:  [83-99] 91 (06/20 0758) Resp:  [14-29] 16 (06/20 0758) BP: (115-187)/(63-86) 115/63 (06/20 0758) SpO2:  [87 %-100 %] 87 % (06/20 0758) Weight:  [55.8 kg] (P) 55.8 kg (06/19 0945) Last BM Date : 04/22/24  Intake/Output from previous day: 06/19 0701 - 06/20 0700 In: 750 [I.V.:400; IV Piggyback:350] Out: 250 [Urine:225; Blood:25] Intake/Output this shift: No intake/output data recorded.  Ab wound clean, incision clean, g tube in place  Lab Results:  Recent Labs    04/21/24 1207 04/23/24 0607  WBC 8.1 20.1*  HGB 11.2* 11.3*  HCT 35.6* 35.6*  PLT 245 267   BMET Recent Labs    04/21/24 1207 04/23/24 0607  NA 135 141  K 4.5 4.4  CL 96* 101  CO2 24 26  GLUCOSE 83 115*  BUN 32* 38*  CREATININE 0.96 0.94  CALCIUM  10.2 10.1   PT/INR No results for input(s): LABPROT, INR in the last 72 hours. ABG No results for input(s): PHART, HCO3 in the last 72 hours.  Invalid input(s): PCO2, PO2  Studies/Results: No results found.  Anti-infectives: Anti-infectives (From admission, onward)    Start     Dose/Rate Route Frequency Ordered Stop   04/22/24 0845  cefoTEtan (CEFOTAN) 1 g in sodium chloride  0.9 % 100 mL IVPB        1 g 200 mL/hr over 30 Minutes Intravenous On call to O.R. 04/22/24 0747 04/22/24 1127       Assessment/Plan: POD 1 g tube placement -can use g tube today for feeds/meds  -will setup follow up for staple removal and wound check -can dc home from my standpoint -will sign off  Enid Harry 04/23/2024

## 2024-04-23 NOTE — Progress Notes (Addendum)
 IMTS Cross Cover Note  Received page from radiology about CXR ordered due to dyspnea which showed new lucency in RUQ and epigastric c/f large volume free air. Evaluated patient at bedside and appears in no acute distress. States still having abdominal pain that is unchanged but not worsening. He is POD 1 after G-tube replacement by General Surgery.   Exam: Vitals:   04/23/24 1800 04/23/24 2049  BP:  (!) 139/58  Pulse:  (!) 104  Resp:  16  Temp: 97.6 F (36.4 C) 98.3 F (36.8 C)  SpO2:  93%   General: alert, NAD Abdomen: soft, G-tube secured and appears intact under wound dressing, no rigidity, diffuse tenderness, no guarding or rebound  Plan: -CT chest/abd/pelvis w contrast to better evaluate after discussion with radiology -Spoke with Dr. Rubin (general surgery) to provide update, may expect pneumoperitoneum after ex lap and G-tube placement, will f/u on CT results  -Pain regimen PRN IV dilaudid  1 mg Q2H  -Monitor vitals and symptoms   ADDENDUM: 0100 CT A/P with extensive pneumoperitoneum c/w recent lap and G-tube revision. Patient denies any worsening pain or symptom change. Expect this to resolve over time. Contacted surgery earlier, see above.   Of note, also noted dense bilateral lower lung lobes consolidation, suspect combination of pneumonia and atelectasis per read. Afebrile but did have new O2 need fo 2-3L [place]. Has leukocytosis of 20. Add on IS and start CAP abx for now.    Jamyrah Saur, DO Internal Medicine Resident PGY-2 Please contact the on-call pager after 5 pm and on weekends at [phone].

## 2024-04-23 NOTE — Progress Notes (Signed)
 Initial Nutrition Assessment  DOCUMENTATION CODES:  Underweight, Severe malnutrition in context of chronic illness  INTERVENTION:  Adjust tube feeding via PEG: Osmolite 1.5 at 55 ml/h (1320 ml per day) free water  q4 hours Provides 1980 kcal, 83 gm protein, 1006 ml free water  daily ( free water , TF+flush) When pt stable for discharge, ok to resume home regimen: 5 cartons of Osmolite 1.5 or equivalent daily Offer 1.5 cartons 2x/d and 1 carton 2x/d (total of 4 each day) free water  before and after each feed (240mL 4x/d) This regimen provides 1775 kcal, 75g protein, and free water  (TF+flush=1810mL free water ) Monitor magnesium  and phosphorus in the AM , MD to replete as needed, as pt is at risk for refeeding syndrome given severe malnutrition. Thiamine  100mg  x 5 days  NUTRITION DIAGNOSIS:  Severe Malnutrition related to chronic illness (dysphagia and esophageal dysmotility) as evidenced by severe fat depletion, severe muscle depletion.  GOAL:  Patient will meet greater than or equal to 90% of their needs  MONITOR:  TF tolerance, I & O's, Labs, Weight trends  REASON FOR ASSESSMENT:  Consult Enteral/tube feeding initiation and management  ASSESSMENT:  Pt with hx of COPD, GERD, HTN, HLD, CKD, gout, esophageal dysmotility, and CAD presented to ED with issues with his g-tube.  6/19 - Exploratory laparotomy with removal of old gastrostomy tube and gastric wedge resection, new gastrostomy tube placement   Pt resting in bed at the time of assessment, wife at bedside assists with hx.   Reports that pt has been having issues with his tube on and off since February and that since that time he has lost weight. Reports that prior to February, pt was doing really well and had been discharged from PT, OT, and SLP services at home. States that pt ambulating with a rollator and was managing his feeds mostly independently. Has used both a gravity bag and syringe at home to  administer feeds. Prescribed regimen is 5 cartons/d (administered over 4 feeds) with free water  before and after each feed (240mL 4x/d). This provides 1775kcal, 75g protein, and free water  (TF+flush).  Wife does report that since pt began having his setbacks in February, he has had a difficult time doing all 5 cartons a day and is typically only infusing 4/d. Did not tolerate Jevity but has been doing well since his change to Osmolite. Reports that pt had gotten his weight back up to ~130 lbs when he was able to do the 5 cartons of Osmolite 1.5 and also were giving an Ensure Very high kcal via tube daily. Bed weight taken in room noted to be 49.7 kg. Pt severely depleted in muscle and fat stores on exam.   Discussed home regimen with wife. While admitted, will add a feed q4h for a total of 6 cartons a day to encourage weight gain. 5 cartons should be sufficient to meet pt's nutrition needs at home once some weight has been regained.  Admit / Current weight: 55.8 kg    Nutritionally Relevant Medications: Scheduled Meds:  celecoxib   200 mg Per Tube q AM   colchicine   0.6 mg Per Tube q AM   famotidine   40 mg Per Tube q AM   OSMOLITE 1.5 CAL  237 mL Per Tube 5 X Daily   metoCLOPramide  10 mg Oral TID   Labs Reviewed: BUN 38 CBG ranges from 81-142 mg/dL over the last 24 hours  NUTRITION - FOCUSED PHYSICAL EXAM: Flowsheet Row Most Recent Value  Orbital Region Severe depletion  Upper Arm Region Severe depletion  Thoracic and Lumbar Region Severe depletion  Buccal Region Severe depletion  Temple Region Severe depletion  Clavicle Bone Region Severe depletion  Clavicle and Acromion Bone Region Severe depletion  Scapular Bone Region Severe depletion  Dorsal Hand Severe depletion  Patellar Region Severe depletion  Anterior Thigh Region Severe depletion  Posterior Calf Region Severe depletion  Edema (RD Assessment) None  Hair Reviewed  Eyes Reviewed  Mouth Reviewed  Skin  Reviewed  Nails Reviewed    Diet Order:   Diet Order             Diet NPO time specified  Diet effective now                   EDUCATION NEEDS:  Education needs have been addressed  Skin:  Skin Assessment: Reviewed RN Assessment  Last BM:  6/19  Height:  Ht Readings from Last 1 Encounters:  04/22/24 (P) 5' 10 (1.778 m)    Weight:  Wt Readings from Last 1 Encounters:  04/23/24 49.7 kg    Ideal Body Weight:  75.5 kg  BMI:  Body mass index is 15.72 kg/m (pended).  Estimated Nutritional Needs:  Kcal:  1700-2000 kcal/d (to encourage weight gain) Protein:  80-100g/d Fluid:  >/=1.7L/d    Edwena Graham, RD, LDN, CNSC Registered Dietitian II Please reach out via secure chat

## 2024-04-23 NOTE — TOC CM/SW Note (Signed)
 Staff currently providing personnel care. Received message from MD that wife has concerns and patient may need short term rehab before returning home. Asked for PT/OT evaluations. Await recommendations.

## 2024-04-23 NOTE — Evaluation (Signed)
 Physical Therapy Evaluation Patient Details Name: Brian Dunn MRN: [id number] DOB: [birth year] Today's Date: 04/23/2024  History of Present Illness  83 y.o. male presents to [facility] hospital on 04/21/2024 with dislodged G-tube. Pt underwent ex-lap with removal of old G-tube and new G-tube placement on 6/19. PMH: CAD, COPD, CKD, Dysphagia, Essential HTN, Osteopenia  Clinical Impression  Pt presents to PT with deficits in strength, power, balance, functional mobility, endurance. Pt reports significant fatigue at this time, requiring assistance for bed mobility and transfers. Pt is unable to ambulate at this time due to fatigue. Pt will benefit from continued frequent mobilization in an effort to improve activity tolerance and to reduce falls risk. Patient will benefit from continued inpatient follow up therapy, <3 hours/day.      If plan is discharge home, recommend the following: A lot of help with walking and/or transfers;A lot of help with bathing/dressing/bathroom;Assistance with cooking/housework;Assist for transportation;Help with stairs or ramp for entrance   Can travel by private vehicle   No    Equipment Recommendations  (TBD pending further assessment)  Recommendations for Other Services       Functional Status Assessment Patient has had a recent decline in their functional status and demonstrates the ability to make significant improvements in function in a reasonable and predictable amount of time.     Precautions / Restrictions Precautions Precautions: Fall Recall of Precautions/Restrictions: Intact Restrictions Weight Bearing Restrictions Per Provider Order: No      Mobility  Bed Mobility Overal bed mobility: Needs Assistance Bed Mobility: Supine to Sit, Sit to Supine     Supine to sit: Mod assist, HOB elevated Sit to supine: Min assist, HOB elevated        Transfers Overall transfer level: Needs assistance Equipment used: None Transfers: Sit to/from  Stand Sit to Stand: Min assist                Ambulation/Gait                  Stairs            Wheelchair Mobility     Tilt Bed    Modified Rankin (Stroke Patients Only)       Balance Overall balance assessment: Needs assistance Sitting-balance support: No upper extremity supported, Feet supported Sitting balance-Leahy Scale: Fair     Standing balance support: No upper extremity supported, During functional activity Standing balance-Leahy Scale: Poor Standing balance comment: CGA-minA for static standing                             Pertinent Vitals/Pain Pain Assessment Pain Assessment: 0-10 Pain Score: 10-Worst pain ever Pain Location: abdomen and L shoulder Pain Descriptors / Indicators: Grimacing Pain Intervention(s): Monitored during session    Home Living Family/patient expects to be discharged to:: Private residence Living Arrangements: Spouse/significant other Available Help at Discharge: Family;Available 24 hours/day Type of Home: House Home Access: Stairs to enter Entrance Stairs-Rails: Can reach both Entrance Stairs-Number of Steps: 5   Home Layout: One level Home Equipment: Grab bars - tub/shower;Shower Counsellor (2 wheels) (upright walker with 4 wheels)      Prior Function Prior Level of Function : Needs assist             Mobility Comments: ambulatory with upright walker ADLs Comments: pt is able to feed himself via bolus feeds, dress himself, bathe himself     Extremity/Trunk Assessment   Upper  Extremity Assessment Upper Extremity Assessment: Generalized weakness    Lower Extremity Assessment Lower Extremity Assessment: Generalized weakness    Cervical / Trunk Assessment Cervical / Trunk Assessment: Kyphotic  Communication   Communication Communication: Impaired Factors Affecting Communication: Hearing impaired;Difficulty expressing self;Reduced clarity of speech    Cognition  Arousal: Alert Behavior During Therapy: Flat affect   PT - Cognitive impairments: Difficult to assess Difficult to assess due to: Impaired communication, Hard of hearing/deaf                     PT - Cognition Comments: pt with vocal cord paralysis whihc is chronic, voice volume is greatly reduced compared to baseline per spouse Following commands: Intact (increased time)       Cueing Cueing Techniques: Verbal cues     General Comments General comments (skin integrity, edema, etc.): pt on 2L [place] upon PT arrival, NT removes oxygen  during PT history, pt found to be hypoxic in 70s by end of mobility although denies DOE. Pt requires 3L [place] to recover sats to mid 90s    Exercises     Assessment/Plan    PT Assessment Patient needs continued PT services  PT Problem List Decreased strength;Decreased activity tolerance;Decreased balance;Decreased mobility;Decreased knowledge of use of DME;Decreased knowledge of precautions;Cardiopulmonary status limiting activity;Pain       PT Treatment Interventions DME instruction;Gait training;Therapeutic activities;Functional mobility training;Stair training;Balance training;Therapeutic exercise;Neuromuscular re-education;Patient/family education    PT Goals (Current goals can be found in the Care Plan section)  Acute Rehab PT Goals Patient Stated Goal: to return to ambulation with upright walker PT Goal Formulation: With patient/family Time For Goal Achievement: 05/07/24 Potential to Achieve Goals: Good    Frequency Min 2X/week     Co-evaluation               AM-PAC PT 6 Clicks Mobility  Outcome Measure Help needed turning from your back to your side while in a flat bed without using bedrails?: A Little Help needed moving from lying on your back to sitting on the side of a flat bed without using bedrails?: A Lot Help needed moving to and from a bed to a chair (including a wheelchair)?: A Lot Help needed standing up from a  chair using your arms (e.g., wheelchair or bedside chair)?: A Little Help needed to walk in hospital room?: Total Help needed climbing 3-5 steps with a railing? : Total 6 Click Score: 12    End of Session Equipment Utilized During Treatment: Oxygen  Activity Tolerance: Patient limited by fatigue Patient left: in bed;with call bell/phone within reach;with bed alarm set Nurse Communication: Mobility status PT Visit Diagnosis: Other abnormalities of gait and mobility (R26.89);Muscle weakness (generalized) (M62.81)    Time: 4540-9811 PT Time Calculation (min) (ACUTE ONLY): 35 min   Charges:   PT Evaluation $PT Eval Low Complexity: 1 Low   PT General Charges $$ ACUTE PT VISIT: 1 Visit         Rexie Catena, PT, DPT Acute Rehabilitation Office [phone]   SKYLAR PRIEST 04/23/2024, 5:27 PM

## 2024-04-23 NOTE — Progress Notes (Signed)
 HD#1 SUBJECTIVE:  Patient Summary: 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  Overnight Events: None   Interim History: Today, the patient was seen at the bedside following his G-tube placement. He appears to be at his baseline and requested pain medication for pain management. The patient is minimally verbal and speaks very little; however, he shook his head when asked if he had any questions. OBJECTIVE:  Vital Signs: Vitals:   04/23/24 0445 04/23/24 0758 04/23/24 1201 04/23/24 1304  BP: 131/73 115/63 117/61   Pulse: 99 91 86   Resp:  16    Temp: 98.2 F (36.8 C) 98 F (36.7 C)    TempSrc: Oral Oral    SpO2: 90% (!) 87% (!) 88% 93%  Weight:      Height:       Supplemental O2: Room Air SpO2: 93 % O2 Flow Rate (L/min): 2 L/min  Filed Weights   04/21/24 1024 04/22/24 0945  Weight: 55.8 kg (P) 55.8 kg     Intake/Output Summary (Last 24 hours) at 04/23/2024 1352 Last data filed at 04/22/2024 2025 Gross per 24 hour  Intake --  Output 225 ml  Net -225 ml   Net IO Since Admission: 300 mL [04/23/24 1352]  Physical Exam: Constitutional:Chronically ill-appearing man.  Cardio:Regular rate and rhythm.  Pulm: Normal work of breathing on room air. MSK: G-tube site, dressing intact, no surrounding erythema noted. Skin:Warm and dry. Neuro:Alert and oriented x3.  Patient Lines/Drains/Airways Status     Active Line/Drains/Airways     Name Placement date Placement time Site Days   Peripheral IV 04/21/24 20 G Left Antecubital 04/21/24  1207  Antecubital  1   Gastrostomy/Enterostomy Other (Comment);Gastrostomy 30 Fr. LUQ 04/14/24  1404  LUQ  8             ASSESSMENT/PLAN:   Assessment: Principal Problem:   Gastrostomy malfunction (HCC) Active Problems:   Anemia   Plan: #Gastrostomy Tube Malfunction #Vagus Nerve Dysfunction - POD 1 Ex lap with partial gastrectomy and G-tube placement today - Optimizing pain control today with home oxycodone  and IV Dilaudid  - Resumed tube feeds and all home medication via the G-Tube today - Appreciate RD's assistance   #Hyperlipidemia  - Resume Pravastatin  40 mg via G-Tube today    #CAD - Resume Aspirin  via G-Tube today   #Depression/Anxiety - Resume Remeron  via G-Tube today   Disposition :  The patient's wife expressed significant concern about his rapid decline over the past few days before hospitalization. She reported that he was previously able to walk independently but has been unable to do so for the past week. She is worried about her ability to care for him alone at home. She requested a physical and occupational therapy evaluation to determine if the patient would benefit from rehabilitation prior to discharge. Given the patient's multiple comorbidities, I agree that a PT/OT reassessment at this time is appropriate. - PT/OT  - Appreciate Johnell Na assistance  Best Practice: VTE: Place and maintain sequential compression device Start: 04/22/24 1330SCDs Code: DNR/DNI Family Contact: Wife , called and notified. DISPO: Anticipated discharge tomorrow to Home pending Surgery and medical management .  Signature: Fay Hoop , MD Internal Medicine Resident, PGY-1 [facility]  Pager: [phone]  1:52 PM, 04/23/2024   Please contact the on call pager after 5 pm and on weekends at [phone].

## 2024-04-23 NOTE — Progress Notes (Signed)
 Patient with labored breathing and O2 at 88% on 2L of oxygen . Increased O2 to 3L now satting 92%, MD notified. Fluids d/c

## 2024-04-23 NOTE — TOC Initial Note (Addendum)
 Transition of Care (TOC) - Initial/Assessment Note   Spoke to patient and wife at bedside. Patient nodded yes / no .   Patient from home with wife.   Patient has transport chair, shower chair , rolator at home. Receives tube feeds through [employer]. Notified Pam with [employer] of admission. He does bolus feeds 5 times a day at home to gravity .   Wife concerned patient is not at baseline and may need short term rehab at a SNF. MD has ordered PT/OT evaluations .   Patient has [facility] at [facility] and [facility] before and would like to return.   NCM explained once receive PT/OT recommendations , TOC will meet with patient and wife again to discuss. If recommendation is for SNF , SW will fax [facility] and [facility] for insurance auth, and provide bed offers with medicare ratings.   Patient is 100% disabled VET  Patient Details  Name: Brian Dunn MRN: [id number] Date of Birth: 1941-07-20  Transition of Care [facility]) CM/SW Contact:    Terre Ferri, RN Phone Number: 04/23/2024, 3:12 PM  Clinical Narrative:                   Expected Discharge Plan:  (Awaiting PT/OT evaluations) Barriers to Discharge: Continued Medical Work up   Patient Goals and CMS Choice Patient states their goals for this hospitalization and ongoing recovery are:: wife asking for rehab , concerned patient is not at baseline to go home          Expected Discharge Plan and Services   Discharge Planning Services: CM Consult   Living arrangements for the past 2 months: Single Family Home                 DME Arranged:  (Await PT/OT recommendations)         HH Arranged:  (Await PT/OT recommendations)          Prior Living Arrangements/Services Living arrangements for the past 2 months: Single Family Home Lives with:: Spouse Patient language and need for interpreter reviewed:: Yes        Need for Family Participation in Patient Care: Yes (Comment) Care giver support system in place?: Yes  (comment) Current home services: DME Criminal Activity/Legal Involvement Pertinent to Current Situation/Hospitalization: No - Comment as needed  Activities of Daily Living   ADL Screening (condition at time of admission) Independently performs ADLs?: Yes (appropriate for developmental age) Is the patient deaf or have difficulty hearing?: No Does the patient have difficulty seeing, even when wearing glasses/contacts?: No Does the patient have difficulty concentrating, remembering, or making decisions?: No  Permission Sought/Granted   Permission granted to share information with : Yes, Verbal Permission Granted  Share Information with NAME: spouse [facility]           Emotional Assessment Appearance:: Appears stated age            Admission diagnosis:  Generalized abdominal pain [R10.84] Gastrostomy malfunction (HCC) [K94.23] Complication of feeding tube [facility]) [K94.20] Patient Active Problem List   Diagnosis Date Noted   Gastrostomy malfunction (HCC) 04/21/2024   Aspiration into airway, initial encounter 10/01/2023   GI bleeding 10/01/2023   Acute respiratory failure with hypoxia (HCC) 10/01/2023   Syncope 03/27/2023   Esophageal dysmotility 03/15/2023   Protein-calorie malnutrition, severe 03/14/2023   Kommerell's diverticulum 03/11/2023   Preop cardiovascular exam 02/26/2023   Aneurysm of right subclavian artery (HCC) 02/07/2023   Dysphagia 12/27/2022   Encounter for fitting and  adjustment of hearing aid 01/29/2022   Chronic kidney disease 01/28/2022   Gout 01/28/2022   Sensorineural hearing loss, bilateral 01/28/2022   Chronic ischemic heart disease, unspecified 01/28/2022   Anemia 02/27/2021   Allergic rhinitis    Hypertension    Angina, class III (HCC) 11/07/2020   Angina pectoris (HCC) 11/07/2020   COPD (chronic obstructive pulmonary disease) (HCC)    GERD (gastroesophageal reflux disease)    Hypogonadism male    Osteopenia    Essential hypertension  11/06/2017   Bilateral inguinal hernia without obstruction or gangrene 06/20/2016   Coronary artery disease involving native coronary artery of native heart with angina pectoris (HCC) 06/20/2016   Hyperlipidemia with target LDL less than 70 06/20/2016   Benign essential hypertension 06/20/2016   CAD (coronary artery disease), native coronary artery 06/20/2016   Hyperlipidemia 06/20/2016   Multiple lung nodules on CT 11/21/2015   Low back pain 09/09/2013   Asthma 06/18/2012   Acute bronchitis 05/29/2012   Cough 03/25/2012   Cough variant asthma 10/18/2010   HYPOGONADISM 02/18/2007   Tremor, essential 02/18/2007   GERD 01/22/2007   Disorder of bone and cartilage 01/22/2007   CHEST PAIN 01/22/2007   Closed fracture of part of neck of femur (HCC) 01/22/2007   HX, PERSONAL, MALIGNANCY, PROSTATE 01/22/2007   Hip fracture (HCC) 01/22/2007   PCP:  Beecher Bower, MD Pharmacy:   [id number] - [place], [place] - [address] AT [address] [place] [address] Phone: [phone] Fax: [phone]  [facility] [address] [place] [address] Phone: [phone] Fax: [phone]  [facility] [address] [place] [address] Phone: [phone] Fax: [phone]     Social Drivers of Health (SDOH) Social History: SDOH Screenings   Food Insecurity: No Food Insecurity (04/22/2024)  Housing: Unknown (04/22/2024)  Transportation Needs: No Transportation Needs (04/22/2024)  Utilities: Not At Risk (04/22/2024)  Depression (PHQ2-9): Low Risk  (04/14/2023)  Social Connections: Unknown (04/22/2024)  Stress: No Stress Concern Present (04/14/2023)  Tobacco Use: Medium Risk (04/22/2024)   SDOH Interventions:     Readmission Risk Interventions    03/24/2023    3:23 PM  Readmission Risk Prevention Plan  Post Dischage Appt Complete  Medication Screening Complete   Transportation Screening Complete

## 2024-04-23 NOTE — Discharge Summary (Deleted)
 Name: [facility] MRN: [id number] DOB: [birth year] 82 y.o. PCP: Beecher Bower, MD  Date of Admission: 04/21/2024 10:08 AM Date of Discharge: 04/23/2024 Attending Physician: Dr. Alwin Baars  Discharge Diagnosis: Principal Problem:   Gastrostomy malfunction [facility]) Active Problems:   Anemia    Discharge Medications: Allergies as of 04/23/2024       Reactions   Metoprolol  Other (See Comments)   Weight gain   Morphine Rash        Medication List     TAKE these medications    alfuzosin 10 MG 24 hr tablet Commonly known as: UROXATRAL 10 mg daily with breakfast. Through feeding tube   allopurinol  100 MG tablet Commonly known as: ZYLOPRIM  Place 100 mg into feeding tube at bedtime.   anti-nausea solution Place 10 mLs into feeding tube 3 (three) times daily as needed for nausea or vomiting.   aspirin  81 MG chewable tablet Place 81 mg into feeding tube at bedtime.   budesonide -formoterol  160-4.5 MCG/ACT inhaler Commonly known as: SYMBICORT  Inhale 2 puffs into the lungs in the morning.   celecoxib  200 MG capsule Commonly known as: CELEBREX  Place 200 mg into feeding tube in the morning.   colchicine  0.6 MG tablet Take 0.5 tablets (0.3 mg total) by mouth daily. What changed:  how much to take how to take this when to take this   dicyclomine 10 MG/5ML solution Commonly known as: BENTYL Place 20 mg into feeding tube See admin instructions. Give 10mL via G-tube 3 times a day as needed for abdominal cramping.   docusate 50 MG/5ML liquid Commonly known as: COLACE Place 10 mLs (100 mg total) into feeding tube daily. What changed:  when to take this reasons to take this   famotidine  40 MG tablet Commonly known as: PEPCID  Place 1 tablet (40 mg total) into feeding tube daily. What changed: when to take this   feeding supplement (OSMOLITE 1.5 CAL) Liqd Place 237 mLs into feeding tube 5 (five) times daily.   ferrous sulfate 325 (65 FE) MG EC tablet 325 mg in the  morning. Through feeding tube   finasteride  5 MG tablet Commonly known as: PROSCAR  5 mg in the morning. into the feeding tube daily   fluticasone  50 MCG/ACT nasal spray Commonly known as: FLONASE  Place 2 sprays into the nose in the morning.   gentamicin cream 0.1 % Commonly known as: GARAMYCIN Apply 1 Application topically 3 (three) times daily.   loratadine  10 MG tablet Commonly known as: CLARITIN  Place 1 tablet (10 mg total) into feeding tube daily. What changed: when to take this   MAGNESIUM  PO 30 mLs by Feeding Tube route at bedtime.   metoCLOPramide 10 MG tablet Commonly known as: REGLAN Take 10 mg by mouth 3 (three) times daily.   mirtazapine  15 MG tablet Commonly known as: REMERON  Place 15 mg into feeding tube at bedtime.   multivitamin capsule Place 1 capsule into feeding tube daily. What changed: when to take this   nitroGLYCERIN  0.4 MG SL tablet Commonly known as: NITROSTAT  Place 1 tablet (0.4 mg total) under the tongue every 5 (five) minutes as needed for chest pain.   Oxycodone  HCl 10 MG Tabs Take 1 tablet (10 mg total) by mouth every 8 (eight) hours as needed for severe pain   pravastatin  40 MG tablet Commonly known as: PRAVACHOL  TAKE 1 TABLET BY MOUTH EVERY DAY   primidone  50 MG tablet Commonly known as: MYSOLINE  Place 1 tablet (50 mg total) into feeding tube at  bedtime.   senna-docusate 8.6-50 MG tablet Commonly known as: Senokot-S Place 1 tablet into feeding tube at bedtime as needed for mild constipation.   traMADol  50 MG tablet Commonly known as: ULTRAM  Place 1 tablet (50 mg total) into feeding tube every 6 (six) hours as needed for moderate pain.   UNABLE TO FIND Place 1 Dose into feeding tube 2 (two) times daily as needed. PainQuil        Disposition and follow-up:   Mr.Brian Dunn was discharged from [facility] in Stable condition.  At the hospital follow up visit please address:  1. Follow-Up:  a. The  patient was admitted with foul-smelling discharge from the G-tube site following tube dislodgement. A replacement G-tube was placed during this hospitalization. He was discharged with instructions to follow up with General Surgery. Please ensure this follow-up is completed as scheduled. Kindly evaluate the G-tube site for signs of infection and initiate treatment if indicated.  b. Please assess the patient's pain level and adjust the pain management regimen as clinically appropriate.  2.  Labs / imaging needed at time of follow-up: N/A  3.  Pending labs/ test needing follow-up: N/A  4.  Medication Changes  No medication changes were made during this hospitalization.  Follow-up Appointments:  Follow-up Information     [facility] Follow up on 05/06/2024.   Specialty: General Surgery Why: nurse visit for staple removal 930 am Contact information: [address] [place]   [address] [phone]        Beecher Bower, MD Follow up.   Specialty: Family Medicine Contact information: [address] [place] [address] [phone]         [facility]illary [place], [place] Follow up on 05/25/2024.   Specialty: General Surgery Why: 945 am Contact information: [address] [place] [address] [phone]                 Hospital Course by problem list: #Gastrostomy Tube Malfunction This is a 83 year old male with a history of CAD, hyperlipidemia, depression anxiety.  He also has a vagus nerve dysfunction and has a G-tube placed for nutrition and medicines as he cannot take anything by mouth.  He presented to our hospital due to concerns for foul-smelling discharge around head G-tube site likely secondary to G-tube clog.  General surgery was consulted who recommended replacing the G-tube.  His G-tube was replaced on 6/19.  Surgery recommendation, the newly placed G-tube was not used until 6/20 to ensure it was  patent.Patient was discharged home on 6/20 and advised to follow-up with general surgery outpatient.  Patient is also on home chronic pain regimen.  His pain was adequately controlled with IV Dilaudid  during this hospitalization.    Discharge Subjective: The patient was evaluated at the bedside. He was observed sitting in bed and appeared to be in his usual state of health. Although minimally verbal, he was able to express a request for pain medication.   Discharge Exam:   BP 117/61 (BP Location: Right Arm)   Pulse 86   Temp 98 F (36.7 C) (Oral)   Resp 16   Ht (P) 5' 10 (1.778 m)   Wt (P) 55.8 kg   SpO2 (!) 88%   BMI (P) 17.65 kg/m  Constitutional: Chronically ill appearing man ,sitting in bed, in no acute distress Cardiovascular: regular rate and rhythm, no m/r/g Pulmonary/Chest: normal work of breathing on room air, lungs clear  to auscultation bilaterally MSK: normal bulk and tone Neurological: alert & oriented x 3 Skin: warm and dry   Pertinent Labs, Studies, and Procedures:     Latest Ref Rng & Units 04/23/2024    6:07 AM 04/21/2024   12:07 PM 04/14/2024   10:51 AM  CBC  WBC 4.0 - 10.5 K/uL 20.1  8.1  7.2   Hemoglobin 13.0 - 17.0 g/dL 28.4  13.2  44.0   Hematocrit 39.0 - 52.0 % 35.6  35.6  32.1   Platelets 150 - 400 K/uL 267  245  227        Latest Ref Rng & Units 04/23/2024    6:07 AM 04/21/2024   12:07 PM 03/17/2024   10:24 AM  CMP  Glucose 70 - 99 mg/dL 102  83  78   BUN 8 - 23 mg/dL 38  32  27   Creatinine 0.61 - 1.24 mg/dL 7.25  3.66  4.40   Sodium 135 - 145 mmol/L 141  135  134   Potassium 3.5 - 5.1 mmol/L 4.4  4.5  4.4   Chloride 98 - 111 mmol/L 101  96  97   CO2 22 - 32 mmol/L 26  24  23    Calcium  8.9 - 10.3 mg/dL 34.7  42.5  95.6   Total Protein 6.5 - 8.1 g/dL  7.9  7.3   Total Bilirubin 0.0 - 1.2 mg/dL  0.7  0.4   Alkaline Phos 38 - 126 U/L  69  117   AST 15 - 41 U/L  21  24   ALT 0 - 44 U/L  16  14     Signed: Fay Hoop, MD Internal Medicine  Teaching Service Pager [phone]

## 2024-04-24 LAB — CBC
HCT: 31.3 % — ABNORMAL LOW (ref 39.0–52.0)
Hemoglobin: 9.9 g/dL — ABNORMAL LOW (ref 13.0–17.0)
MCH: 30.8 pg (ref 26.0–34.0)
MCHC: 31.6 g/dL (ref 30.0–36.0)
MCV: 97.5 fL (ref 80.0–100.0)
Platelets: 232 10*3/uL (ref 150–400)
RBC: 3.21 MIL/uL — ABNORMAL LOW (ref 4.22–5.81)
RDW: 14.9 % (ref 11.5–15.5)
WBC: 20.2 10*3/uL — ABNORMAL HIGH (ref 4.0–10.5)
nRBC: 0 % (ref 0.0–0.2)

## 2024-04-24 LAB — MAGNESIUM: Magnesium: 2.2 mg/dL (ref 1.7–2.4)

## 2024-04-24 LAB — PHOSPHORUS: Phosphorus: 2.6 mg/dL (ref 2.5–4.6)

## 2024-04-24 LAB — BASIC METABOLIC PANEL WITH GFR
Anion gap: 10 (ref 5–15)
BUN: 61 mg/dL — ABNORMAL HIGH (ref 8–23)
CO2: 27 mmol/L (ref 22–32)
Calcium: 9.4 mg/dL (ref 8.9–10.3)
Chloride: 105 mmol/L (ref 98–111)
Creatinine, Ser: 1.08 mg/dL (ref 0.61–1.24)
GFR, Estimated: >60 mL/min (ref >60)
Glucose, Bld: 150 mg/dL — ABNORMAL HIGH (ref 70–99)
Potassium: 4.5 mmol/L (ref 3.5–5.1)
Sodium: 142 mmol/L (ref 135–145)

## 2024-04-24 LAB — GLUCOSE, CAPILLARY
Glucose-Capillary: 181 mg/dL — ABNORMAL HIGH (ref 70–99)
Glucose-Capillary: 185 mg/dL — ABNORMAL HIGH (ref 70–99)
Glucose-Capillary: 212 mg/dL — ABNORMAL HIGH (ref 70–99)

## 2024-04-24 MED ORDER — GLYCOPYRROLATE 0.2 MG/ML IJ SOLN
0.2000 mg | INTRAMUSCULAR | Status: DC | PRN
Start: 2024-04-24 — End: 2024-04-26
  Administered 2024-04-25: 0.2 mg via INTRAVENOUS
  Filled 2024-04-24 (×2): qty 1

## 2024-04-24 MED ORDER — BIOTENE DRY MOUTH MT LIQD: 15.0000 mL | OROMUCOSAL | Status: DC | PRN

## 2024-04-24 MED ORDER — WHITE PETROLATUM EX OINT
TOPICAL_OINTMENT | CUTANEOUS | Status: DC | PRN
  Filled 2024-04-24: qty 28.35

## 2024-04-24 MED ORDER — LORAZEPAM 2 MG/ML IJ SOLN
1.0000 mg | INTRAMUSCULAR | Status: DC | PRN
  Administered 2024-04-25 – 2024-04-26 (×2): 1 mg via INTRAVENOUS
  Filled 2024-04-24 (×2): qty 1

## 2024-04-24 MED ORDER — POLYVINYL ALCOHOL 1.4 % OP SOLN: 1.0000 [drp] | Freq: Four times a day (QID) | OPHTHALMIC | Status: DC | PRN

## 2024-04-24 MED ORDER — LORAZEPAM 1 MG PO TABS
1.0000 mg | ORAL_TABLET | ORAL | Status: DC | PRN
Start: 2024-04-24 — End: 2024-04-26

## 2024-04-24 MED ORDER — HALOPERIDOL 0.5 MG PO TABS: 0.5000 mg | ORAL_TABLET | ORAL | Status: DC | PRN

## 2024-04-24 MED ORDER — AZITHROMYCIN 250 MG PO TABS: 500.0000 mg | ORAL_TABLET | Freq: Every day | ORAL | Status: DC

## 2024-04-24 MED ORDER — GLYCOPYRROLATE 0.2 MG/ML IJ SOLN
0.2000 mg | INTRAMUSCULAR | Status: DC | PRN
Start: 2024-04-24 — End: 2024-04-26

## 2024-04-24 MED ORDER — HALOPERIDOL LACTATE 2 MG/ML PO CONC: 0.5000 mg | ORAL | Status: DC | PRN

## 2024-04-24 MED ORDER — LORAZEPAM 2 MG/ML PO CONC: 1.0000 mg | ORAL | Status: DC | PRN

## 2024-04-24 MED ORDER — ACETAMINOPHEN 650 MG RE SUPP: 650.0000 mg | Freq: Four times a day (QID) | RECTAL | Status: DC | PRN

## 2024-04-24 MED ORDER — SODIUM CHLORIDE 0.9 % IV SOLN: 1.0000 g | INTRAVENOUS | Status: DC

## 2024-04-24 MED ORDER — HYDROMORPHONE HCL 1 MG/ML IJ SOLN
0.5000 mg | INTRAMUSCULAR | Status: DC | PRN
  Administered 2024-04-24 – 2024-04-25 (×4): 1 mg via INTRAVENOUS
  Filled 2024-04-24: qty 1
  Filled 2024-04-24: qty 2
  Filled 2024-04-24 (×3): qty 1

## 2024-04-24 MED ORDER — AZITHROMYCIN 250 MG PO TABS
500.0000 mg | ORAL_TABLET | Freq: Every day | ORAL | Status: DC
  Administered 2024-04-24: 500 mg
  Filled 2024-04-24: qty 2

## 2024-04-24 MED ORDER — SODIUM CHLORIDE 0.9 % IV SOLN
1.0000 g | Freq: Once | INTRAVENOUS | Status: AC
  Administered 2024-04-24: 1 g via INTRAVENOUS
  Filled 2024-04-24: qty 10

## 2024-04-24 MED ORDER — ACETAMINOPHEN 325 MG PO TABS
650.0000 mg | ORAL_TABLET | Freq: Four times a day (QID) | ORAL | Status: DC | PRN
Start: 2024-04-24 — End: 2024-04-26

## 2024-04-24 MED ORDER — GLYCOPYRROLATE 1 MG PO TABS: 1.0000 mg | ORAL_TABLET | ORAL | Status: DC | PRN

## 2024-04-24 MED ORDER — HALOPERIDOL LACTATE 5 MG/ML IJ SOLN: 0.5000 mg | INTRAMUSCULAR | Status: DC | PRN

## 2024-04-24 NOTE — Progress Notes (Signed)
   HD#2 SUBJECTIVE:  Patient Summary: 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  Overnight Events: See overnight note   Interim History: Pt seen bedside this AM. He looks very ill, very dry, and has his mouth open looking up. Wife also at bedside. Explained patient's clinical course to her, and how much worse it's gotten since being admitted, and she agrees for comfort care.  OBJECTIVE:  Vital Signs: Vitals:   04/23/24 1800 04/23/24 2049 04/24/24 0129 04/24/24 0525  BP:  (!) 139/58  139/63  Pulse:  (!) 104  84  Resp:  16  18  Temp: 97.6 F (36.4 C) 98.3 F (36.8 C)  98.3 F (36.8 C)  TempSrc: Oral Oral  Oral  SpO2:  93% 95% 96%  Weight:      Height:       Supplemental O2: Room Air SpO2: 96 % O2 Flow Rate (L/min): 1 L/min  Filed Weights   04/21/24 1024 04/22/24 0945 04/23/24 1522  Weight: 55.8 kg (P) 55.8 kg 49.7 kg     Intake/Output Summary (Last 24 hours) at 04/24/2024 1218 Last data filed at 04/24/2024 1200 Gross per 24 hour  Intake 0 ml  Output 50 ml  Net -50 ml   Net IO Since Admission: 250 mL [04/24/24 1218]  Physical Exam: Constitutional:Chronically ill-appearing man.  Cardio:Regular rate and rhythm.  Pulm: Normal work of breathing on room air. MSK: G-tube site, dressing intact, no surrounding erythema noted. Skin:Warm and dry. Neuro:Alert and oriented x3.  Patient Lines/Drains/Airways Status     Active Line/Drains/Airways     Name Placement date Placement time Site Days   Peripheral IV 04/21/24 20 G Left Antecubital 04/21/24  1207  Antecubital  1   Gastrostomy/Enterostomy Other (Comment);Gastrostomy 30 Fr. LUQ 04/14/24  1404  LUQ  8              ASSESSMENT/PLAN:  Assessment: Principal Problem:   Gastrostomy malfunction (HCC) Active Problems:   Anemia   Plan: #Gastrostomy Tube Malfunction #Vagus Nerve Dysfunction #Potential aspiration pneumonia  #Extensive Pneumoperitoneum  #Hyperlipidemia  #CAD  #Depression/Anxiety  Pt seen bedside this AM, has had significant clinical deterioration in the last 24 hours. Overnight chest x-ray showed bibasilar streaks, consistent with pneumonia or atelectasis, white blood cell count has jumped to 20, he is very dry on exam, and still endorsing significant abdominal pain. Wife is at bedside, and had a long conversation with her about what the future will look like, especially now with potential aspiration pneumonia requiring longer hospital stay. He is already very deconditioned when G-Tube was dislodged. His activity level has significantly decreased as well, as he isn't responding as much to questions and seems to be in a daze. Wife agrees that the best next step is comfort care, with focus on comfort and dignity. Orders have been placed, palliative has been consulted, TOC for hospice D/C.  Plan:  - Comfort Care  - Palliative Consult     Signature: Kamaal Cast, MD Internal Medicine Resident, PGY-2 [facility]  Pager: [phone] 12:18 PM, 04/24/2024   Please contact the on call pager after 5 pm and on weekends at [phone].

## 2024-04-24 NOTE — Progress Notes (Signed)
 OT Cancellation Note and Discharge  Patient Details Name: Brian Dunn MRN: [id number] DOB: [birth year]   Cancelled Treatment:    Reason Eval/Treat Not Completed: Other (comment) (Per MD, pt is transitioning to comfort care. OT signing off at this time. Please reconsult as appropriate.)  Margarie Rockey HERO., OTR/L, MA Acute Rehab [phone]   Margarie FORBES Horns 04/24/2024, 9:25 AM

## 2024-04-24 NOTE — Plan of Care (Signed)

## 2024-04-25 DIAGNOSIS — Z7189 Other specified counseling: Secondary | ICD-10-CM

## 2024-04-25 DIAGNOSIS — R532 Functional quadriplegia: Secondary | ICD-10-CM | POA: Diagnosis present

## 2024-04-25 DIAGNOSIS — Z515 Encounter for palliative care: Secondary | ICD-10-CM

## 2024-04-25 DIAGNOSIS — K9423 Gastrostomy malfunction: Principal | ICD-10-CM

## 2024-04-25 DIAGNOSIS — R64 Cachexia: Secondary | ICD-10-CM | POA: Diagnosis present

## 2024-04-25 MED ORDER — HYDROMORPHONE HCL 1 MG/ML IJ SOLN
0.5000 mg | INTRAMUSCULAR | Status: DC | PRN
  Administered 2024-04-26: 1 mg via INTRAVENOUS
  Filled 2024-04-25: qty 1

## 2024-04-25 NOTE — Plan of Care (Signed)
  Problem: Health Behavior/Discharge Planning: Goal: Ability to manage health-related needs will improve Outcome: Progressing   Problem: Clinical Measurements: Goal: Ability to maintain clinical measurements within normal limits will improve Outcome: Progressing Goal: Will remain free from infection Outcome: Progressing Goal: Diagnostic test results will improve Outcome: Progressing Goal: Respiratory complications will improve Outcome: Progressing Goal: Cardiovascular complication will be avoided Outcome: Progressing   Problem: Education: Goal: Knowledge of General Education information will improve Description: Including pain rating scale, medication(s)/side effects and non-pharmacologic comfort measures Outcome: Progressing   Problem: Elimination: Goal: Will not experience complications related to bowel motility Outcome: Progressing Goal: Will not experience complications related to urinary retention Outcome: Progressing

## 2024-04-25 NOTE — TOC Transition Note (Signed)
 Transition of Care [facility]) - Discharge Note   Patient Details  Name: Brian Dunn MRN: [id number] Date of Birth: Dec 27, 1940  Transition of Care [facility]) CM/SW Contact:  Darin JULIANNA Bend, LCSW Phone Number: 04/25/2024, 4:02 PM   Clinical Narrative:    CSW was contacted to support the patient's disposition (hospice arrangements based on preference by the pt's wife.   CSW made contact with the pt's wife, and she expressed [facility] on [address] in [place].   CSW made contact with te hspice rep: Almarie [phone]. The rep note she will contact the wife regarding the disposition and arrange hospice services with her. The rep noted there were no residential beds today.   CSW updated the pt's wife, who expressed alignment with the disposition support and said she will speak with the rep tomorrow for hospice services.   CSW updated clinical team. No other needs identified of this Clinical research associate. Please contact TOC if there is additional disposition support needed.      Final next level of care:  (awaiting PT /OT evaluations) Barriers to Discharge: Continued Medical Work up   Patient Goals and CMS Choice Patient states their goals for this hospitalization and ongoing recovery are:: wife asking for rehab , concerned patient is not at baseline to go home          Discharge Placement                       Discharge Plan and Services Additional resources added to the After Visit Summary for     Discharge Planning Services: CM Consult            DME Arranged:  (Await PT/OT recommendations)         HH Arranged:  (Await PT/OT recommendations)          Social Drivers of Health (SDOH) Interventions SDOH Screenings   Food Insecurity: No Food Insecurity (04/22/2024)  Housing: Unknown (04/22/2024)  Transportation Needs: No Transportation Needs (04/22/2024)  Utilities: Not At Risk (04/22/2024)  Depression (PHQ2-9): Low Risk  (04/14/2023)  Social  Connections: Unknown (04/22/2024)  Stress: No Stress Concern Present (04/14/2023)  Tobacco Use: Medium Risk (04/22/2024)     Readmission Risk Interventions    03/24/2023    3:23 PM  Readmission Risk Prevention Plan  Post Dischage Appt Complete  Medication Screening Complete  Transportation Screening Complete

## 2024-04-25 NOTE — Consult Note (Signed)
 Palliative Care Consult Note                                  Date: 04/25/2024   Patient Name: Brian Dunn [facility]  DOB: 07/05/1941  MRN: [id number]  Age / Sex: 83 y.o., male  PCP: Fernand Tracey LABOR, MD Referring Physician: Trudy Mliss Dragon, MD  Reason for Consultation: End of life care  HPI/Patient Profile: 83 y.o. 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 Pt underwent ex-lap with removal of old G-tube and new G-tube placement on 6/19. Patient had a decline and patient and his wife decided to transition him to comfort measures.  Past Medical History:  Diagnosis Date   Acute bronchitis 05/29/2012   Acute respiratory failure with hypoxia (HCC) 10/01/2023   Allergic rhinitis    Anemia in chronic kidney disease 02/27/2021   Aneurysm of right subclavian artery (HCC) 02/07/2023   Angina pectoris (HCC) 11/07/2020   Angina, class III (HCC) 11/07/2020   Aspiration into airway, initial encounter 10/01/2023   Asthma 06/18/2012   Followed in Pulmonary clinic/ [facility]/ [facility]  - 01/09/2018  After extensive coaching inhaler device  effectiveness =   75% > continue symb 160 2bid and return for pfts in 3 months ? RA bronchiolitis?  - PFT's  04/23/2018  FEV1 2.84 (91 % ) ratio 73  p no % improvement from saba p symb 160 prior to study with DLCO  76 % corrects to 79  % for alv volume   - 04/23/2018  After extensive coaching   Benign essential hypertension 06/20/2016   Bilateral inguinal hernia without obstruction or gangrene 06/20/2016   CAD (coronary artery disease), native coronary artery 06/20/2016   Chest pain, unspecified    Chronic ischemic heart disease, unspecified 01/28/2022   Chronic kidney disease 01/28/2022   Closed  fracture of part of neck of femur (HCC) 01/22/2007   Annotation: right-sided status post surgery  Qualifier: Diagnosis of   By: Amon MD, Aloysius DRAFTS     IMO SNOMED Dx Update Oct 2024     COPD (chronic obstructive pulmonary disease) (HCC)    Coronary artery disease involving native coronary artery of native heart with angina pectoris (HCC) 06/20/2016   Cough 03/25/2012   Followed in Pulmonary clinic/ [facility]/ [place]    - Trial off acei again  03/25/2012    - Sinus CT 01/24/11 There is mucosal thickening within the paranasal sinuses without  air-fluid levels. Neither ostiomeatal unit is patent.    Cough variant asthma 10/18/2010   Followed in Pulmonary clinic/ [facility]/ [place]  -HFA 75% p coaching 01/22/2011  -PFT's  02/04/2011 minimal airflow obst, nl dlco    Disorder of bone and cartilage 01/22/2007   Qualifier: Diagnosis of  By: Amon MD, Aloysius BRAVO.   Overview:  Overview:  Qualifier: Diagnosis of  By: Amon MD, Jose E.   Dysphagia 12/27/2022   Onset ? Aug 2022 p febrile illness ? Asp pna   -  MBS 01/13/23   PO Diet Recommendation: Regular; Thin liquids (Level 0)  Liquid Administration via: Cup; Spoon; Straw  Supervision: Patient able to self-feed  Postural changes: -- (try reclining when sipping liquids)  Oral care recommendations: Oral care QID (4x/day); Oral care before ice  chips/water   Recommended consults: Consider ENT consultation   Encounter for fitting and adjustment of hearing aid 01/29/2022   Esophageal dysmotility 03/15/2023   Essential hypertension 11/06/2017   GERD 01/22/2007   Annotation: history of esophageal stricture Qualifier: Diagnosis of  By: Amon MD, Aloysius BRAVO.    GERD (gastroesophageal reflux disease)    GI bleeding 10/01/2023   Gout 01/28/2022   Hip fracture (HCC)    Hyperlipidemia 06/20/2016   Hyperlipidemia with target LDL less than 70 06/20/2016   Hypertension    HYPOGONADISM 02/18/2007   Qualifier: Diagnosis of  By: Amon MD, Aloysius BRAVO.    Hypogonadism male     Kommerell's diverticulum 03/11/2023   Low back pain 09/09/2013   Multiple lung nodules on CT 11/21/2015   CT [place] 10/17/15 mpns > 15 y since quit smoking > rec 12 m f/u as this is low risk > done 06/04/16 no change rec recheck in 63m  - CT [place] 12/08/17 no change nodules > meets benign criteria > no directed f/u - Quantiferon GOLD TB 01/09/18 neg    Osteopenia    Personal history of malignant neoplasm of prostate    Postoperative visit 08/14/2016   Preop cardiovascular exam 02/26/2023   Protein-calorie malnutrition, severe 03/14/2023   Sensorineural hearing loss, bilateral 01/28/2022   Syncope 03/27/2023   Tremor, essential 02/18/2007   Overview:  Overview:  Qualifier: Diagnosis of  By: Amon MD, Aloysius BRAVO.  Last Assessment & Plan:  Reviewed tremor may increase on dulera  so will need to be balanced against benefits to cough    Subjective:   I have reviewed medical records including EPIC notes, labs and imaging, received an update from the team, assessed the patient and then met with the patient's wife to discuss GOC, EOL wishes, and disposition options.  I introduced Palliative Medicine as specialized medical care for people living with serious illness. It focuses on providing relief from symptoms and stress of a serious illness. The goal is to improve quality of life for both the patient and the family.  Created space and opportunity for patient  and family to explore thoughts and feelings regarding current medical situation. Values and goals of care important to patient and family were attempted to be elicited.    Today's Discussion: Patient lying in bed with eyes closed. He appears comfortable. Patient's wife Brian Dunn is at bedside with her two close friends. Rose shared a brief life history of the patient. 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 They have been together over 30 years and are soul mates.   Created space and opportunity for patient's wife Brian Dunn to explore thoughts  and feelings regarding current medical situation. She shared that the patient has had a gradual decline over the last year.  She shared that just a few days ago she anticipated taking the patient home and now they are facing his end-of-life and this is difficult to process. Values and goals of care important to patient and family were attempted to be elicited. Rose shared that her primary concern is the patient not suffer in his end of life. Emotional support and therapeutic listening provided.  We discussed the focus of the patient's care is now comfort. Moving forward all care would focus on how the patient is looking and feeling. This would include management of any symptoms that may cause discomfort, pain, shortness of breath, cough, nausea, agitation, anxiety, and/or secretions etc. Symptoms would be managed with medications and other non-pharmacological  interventions such as repositioning and spiritual support if requested. We discussed not escalating the patient's oxygen  by nasal cannula which is currently at 1.5L. Rose verbalized understanding and appreciation. We discussed the potential locations for end of life care. Rose confirmed they are interested in inpatient hospice with [facility] in [place] as this is close to [place]. Notified LCSW.  Questions and concerns were addressed. Hard Choices booklet left for review. The family was encouraged to call with questions or concerns. PMT will continue to support holistically.  14:45: Received update from LCSW that [facility] should have a bed for the patient tomorrow.  Review of Systems  Objective:   Primary Diagnoses: Present on Admission:  Gastrostomy malfunction (HCC)  Anemia   Physical Exam Constitutional:      General: He is sleeping. He is not in acute distress.    Appearance: He is cachectic. He is ill-appearing.     Interventions: Nasal cannula in place.     Comments: O2 at 1.5L  HENT:     Head: Normocephalic and  atraumatic.  Pulmonary:     Comments: Even and unlabored  Skin:    General: Skin is warm and dry.     Vital Signs:  BP 112/60 (BP Location: Right Arm)   Pulse 84   Temp 98.3 F (36.8 C) (Oral)   Resp 18   Ht (P) 5' 10 (1.778 m)   Wt 49.7 kg Comment: bed weight  SpO2 96%   BMI (P) 15.72 kg/m   Palliative Assessment/Data: 10%    Advanced Care Planning:   Existing Vynca/ACP Documentation: None  Primary Decision Maker: NEXT OF KIN  Code Status/Advance Care Planning: DNR   Assessment & Plan:   SUMMARY OF RECOMMENDATIONS   Full comfort care Comfort medications per [facility]- managed by primary team Changed parameters of PRN IV dilaudid  Spiritual care consult [facility]- tomorrow PMT will continue to support   Discussed with: Attending team, LCSW, and bedside RN  Time Total: 75 minutes    Thank you for allowing us  to participate in the care of [facility] PMT will continue to support holistically.   Signed by: Stephane Palin, NP Palliative Medicine Team  Team Phone # [phone] (Nights/Weekends)  04/25/2024, 2:19 PM

## 2024-04-25 NOTE — Progress Notes (Addendum)
   HD#3 SUBJECTIVE:  Patient Summary: 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 He has had a prolonged decline in function and he and his wife decided yesterday that he was ready to proceed with comfort care.    Overnight Events: None   Interim History:  Today, Brian Dunn was assessed at his bedside while lying in bed, which was his usual position. He reported experiencing pain.  OBJECTIVE:  Vital Signs: Vitals:   04/23/24 2049 04/24/24 0129 04/24/24 0525 04/25/24 0548  BP: (!) 139/58  139/63 112/60  Pulse: (!) 104  84   Resp: 16  18   Temp: 98.3 F (36.8 C)  98.3 F (36.8 C)   TempSrc: Oral  Oral   SpO2: 93% 95% 96%   Weight:      Height:       Supplemental O2: Room Air SpO2: 96 % O2 Flow Rate (L/min): 1 L/min  Filed Weights   04/21/24 1024 04/22/24 0945 04/23/24 1522  Weight: 55.8 kg (P) 55.8 kg 49.7 kg    No intake or output data in the 24 hours ending 04/25/24 1614  Net IO Since Admission: -150 mL [04/25/24 1614]  Physical Exam: Constitutional:Chronically ill-appearing man, cachectic.  Cardio:Regular rate and rhythm.  Pulm: Normal work of breathing on room air. MSK: G-tube site, dressing intact, no surrounding erythema noted. Skin:Warm and dry. Neuro:Alert and oriented x3.  Patient Lines/Drains/Airways Status     Active Line/Drains/Airways     Name Placement date Placement time Site Days   Peripheral IV 04/21/24 20 G Left Antecubital 04/21/24  1207  Antecubital  1   Gastrostomy/Enterostomy Other (Comment);Gastrostomy 30 Fr. LUQ 04/14/24  1404  LUQ  8             ASSESSMENT/PLAN:  Assessment: Principal Problem:   End of life care Active Problems:   Severe  protein-calorie malnutrition (HCC)   Anemia   Gastrostomy malfunction (HCC)   Cachexia (HCC)   Complete immobility due to severe physical disability or frailty (HCC)   Plan: #Gastrostomy Tube Malfunction #Vagus Nerve Dysfunction #Potential aspiration pneumonia  #Extensive Pneumoperitoneum  #Hyperlipidemia  #CAD  #Depression/Anxiety  The patient was admitted due to a G-tube malfunction. He underwent an exploratory laparotomy with partial gastrectomy and G-tube placement on 6/19. Unfortunately, Brian Dunn condition has deteriorated over the past couple of weeks, and following discussions with his wife, he has been transitioned to comfort care. Today, he reported experiencing pain, and additional pain management has been ordered to ensure his comfort during his remaining days. IMTS will continue to provide care for this patient at this time, with a focus on optimizing pain control. - Optimize pain control    Signature: Drue Lisa Grow MD 04/25/2024, 4:14 PM   Please contact the on call pager after 5 pm and on weekends at [phone].

## 2024-04-26 ENCOUNTER — Other Ambulatory Visit (HOSPITAL_COMMUNITY): Payer: Self-pay

## 2024-04-26 MED ORDER — LORAZEPAM 1 MG PO TABS
1.0000 mg | ORAL_TABLET | ORAL | Status: DC | PRN
Start: 2024-04-26 — End: 2024-05-05

## 2024-04-26 MED ORDER — POLYVINYL ALCOHOL 1.4 % OP SOLN: 1.0000 [drp] | Freq: Four times a day (QID) | OPHTHALMIC | Status: DC | PRN

## 2024-04-26 MED ORDER — HALOPERIDOL 0.5 MG PO TABS: 0.5000 mg | ORAL_TABLET | ORAL | Status: DC | PRN

## 2024-04-26 MED ORDER — EMETROL 1.87-1.87-21.5 PO SOLN
10.0000 mL | Freq: Three times a day (TID) | ORAL | 0 refills | Status: DC | PRN
  Filled 2024-04-26: qty 118, 4d supply, fill #0

## 2024-04-26 MED ORDER — GLYCOPYRROLATE 1 MG PO TABS: 1.0000 mg | ORAL_TABLET | ORAL | Status: DC | PRN

## 2024-04-26 MED ORDER — HYDROMORPHONE HCL 1 MG/ML IJ SOLN: 0.5000 mg | INTRAMUSCULAR | Status: DC | PRN

## 2024-04-26 MED ORDER — WHITE PETROLATUM EX OINT: 1.0000 | TOPICAL_OINTMENT | CUTANEOUS | Status: DC | PRN

## 2024-04-26 MED ORDER — BIOTENE DRY MOUTH MT LIQD: 15.0000 mL | OROMUCOSAL | Status: DC | PRN

## 2024-04-26 NOTE — Care Management Important Message (Deleted)
 Important Message  Patient Details  Name: Brian Dunn MRN: [id number] Date of Birth: 02-May-1941   Important Message Given:  Yes - Medicare IM     [name] 04/26/2024, 1:12 PM

## 2024-04-26 NOTE — Progress Notes (Signed)
 This chaplain responded to PMT NP-Dawn consult for spiritual care in the setting of support of the Pt. wife-Rose. The chaplain understands the Pt. is resting comfortably while Hospice is reviewing the referral for in patient Hospice care.  The chaplain listened reflectively as Rumalda shared the couple's preparation for end of life and the anticipatory grief associated in actually making the decisions. The Pt. and Rose's belief in eternal life provides comfort in this very difficult time. Rumalda shares she is supported by friends, a blended family, and the knowledge of how to reach out for professional support.   Rose accepted the chaplain's invitation for prayer and continued spiritual care. The chaplain understands from [facility] the Pt. body is very fragile. Rose requests a gentle touch during the transition of care.  Chaplain Leeroy Hummer [phone]

## 2024-04-26 NOTE — Discharge Summary (Signed)
 Name: Brian Dunn [facility] MRN: [id number] DOB: 1941-03-28 83 y.o. PCP: Fernand Tracey LABOR, MD  Date of Admission: 04/21/2024 10:08 AM Date of Discharge: 04/26/2024 Attending Physician: IMTSattending2025/2026: Dr. Reyes Fenton  Discharge Diagnosis: 1. Principal Problem:   End of life care Active Problems:   Anemia   Severe protein-calorie malnutrition (HCC)   Gastrostomy malfunction (HCC)   Cachexia (HCC)   Complete immobility due to severe physical disability or frailty [facility])    Discharge Medications: Allergies as of 04/26/2024       Reactions   Metoprolol  Other (See Comments)   Weight gain   Morphine Rash        Medication List     TAKE these medications    alfuzosin 10 MG 24 hr tablet Commonly known as: UROXATRAL 10 mg daily with breakfast. Through feeding tube   allopurinol  100 MG tablet Commonly known as: ZYLOPRIM  Place 100 mg into feeding tube at bedtime.   anti-nausea solution Place 10 mLs into feeding tube 3 (three) times daily as needed for nausea or vomiting. What changed: Another medication with the same name was added. Make sure you understand how and when to take each.   anti-nausea solution Place 10 mLs into feeding tube 3 (three) times daily as needed for nausea or vomiting. What changed: You were already taking a medication with the same name, and this prescription was added. Make sure you understand how and when to take each.   antiseptic oral rinse Liqd Apply 15 mLs topically as needed for dry mouth.   artificial tears ophthalmic solution Place 1 drop into both eyes 4 (four) times daily as needed for dry eyes.   aspirin  81 MG chewable tablet Place 81 mg into feeding tube at bedtime.   budesonide -formoterol  160-4.5 MCG/ACT inhaler Commonly known as: SYMBICORT  Inhale 2 puffs into the lungs in the morning.   celecoxib  200 MG capsule Commonly known as: CELEBREX  Place 200 mg into feeding tube in the morning.   colchicine  0.6 MG tablet Take  0.5 tablets (0.3 mg total) by mouth daily. What changed:  how much to take how to take this when to take this   dicyclomine 10 MG/5ML solution Commonly known as: BENTYL Place 20 mg into feeding tube See admin instructions. Give 10mL via G-tube 3 times a day as needed for abdominal cramping.   docusate 50 MG/5ML liquid Commonly known as: COLACE Place 10 mLs (100 mg total) into feeding tube daily. What changed:  when to take this reasons to take this   famotidine  40 MG tablet Commonly known as: PEPCID  Place 1 tablet (40 mg total) into feeding tube daily. What changed: when to take this   feeding supplement (OSMOLITE 1.5 CAL) Liqd Place 237 mLs into feeding tube 5 (five) times daily.   ferrous sulfate 325 (65 FE) MG EC tablet 325 mg in the morning. Through feeding tube   finasteride  5 MG tablet Commonly known as: PROSCAR  5 mg in the morning. into the feeding tube daily   fluticasone  50 MCG/ACT nasal spray Commonly known as: FLONASE  Place 2 sprays into the nose in the morning.   gentamicin  cream 0.1 % Commonly known as: GARAMYCIN  Apply 1 Application topically 3 (three) times daily.   glycopyrrolate 1 MG tablet Commonly known as: ROBINUL Take 1 tablet (1 mg total) by mouth every 4 (four) hours as needed (excessive secretions).   haloperidol 0.5 MG tablet Commonly known as: HALDOL Take 1 tablet (0.5 mg total) by mouth every 4 (four) hours  as needed for agitation (or delirium).   HYDROmorphone  1 MG/ML injection Commonly known as: DILAUDID  Inject 0.5-2 mLs (0.5-2 mg total) into the vein every 30 (thirty) minutes as needed for severe pain (pain score 7-10) or moderate pain (pain score 4-6) (To alleviate signs and symptoms of distress or dyspnea).   loratadine  10 MG tablet Commonly known as: CLARITIN  Place 1 tablet (10 mg total) into feeding tube daily. What changed: when to take this   LORazepam 1 MG tablet Commonly known as: ATIVAN Take 1 tablet (1 mg total) by  mouth every 4 (four) hours as needed for anxiety.   MAGNESIUM  PO 30 mLs by Feeding Tube route at bedtime.   metoCLOPramide  10 MG tablet Commonly known as: REGLAN  Take 10 mg by mouth 3 (three) times daily.   mirtazapine  15 MG tablet Commonly known as: REMERON  Place 15 mg into feeding tube at bedtime.   multivitamin capsule Place 1 capsule into feeding tube daily. What changed: when to take this   nitroGLYCERIN  0.4 MG SL tablet Commonly known as: NITROSTAT  Place 1 tablet (0.4 mg total) under the tongue every 5 (five) minutes as needed for chest pain.   Oxycodone  HCl 10 MG Tabs Take 1 tablet (10 mg total) by mouth every 8 (eight) hours as needed for severe pain   pravastatin  40 MG tablet Commonly known as: PRAVACHOL  TAKE 1 TABLET BY MOUTH EVERY DAY   primidone  50 MG tablet Commonly known as: MYSOLINE  Place 1 tablet (50 mg total) into feeding tube at bedtime.   senna-docusate 8.6-50 MG tablet Commonly known as: Senokot-S Place 1 tablet into feeding tube at bedtime as needed for mild constipation.   traMADol  50 MG tablet Commonly known as: ULTRAM  Place 1 tablet (50 mg total) into feeding tube every 6 (six) hours as needed for moderate pain.   UNABLE TO FIND Place 1 Dose into feeding tube 2 (two) times daily as needed. PainQuil   white petrolatum Oint Commonly known as: VASELINE Apply 1 Application topically as needed for lip care.        Disposition and follow-up:   Mr.Daivd Monroe Brandner was discharged from [facility] in Good condition.  At the hospital follow up visit please address:  1.  Comfort [facility]    Follow-up Appointments:  Follow-up Information     [facility] Follow up on 05/06/2024.   Specialty: General Surgery Why: nurse visit for staple removal 930 am Contact information: [address] [place]  [address] [phone]        Fernand Tracey LABOR, MD Follow up.   Specialty: Family  Medicine Contact information: [address] [place] [address] [phone]         Tari Tonja Barban, [place] Follow up on 05/25/2024.   Specialty: General Surgery Why: 945 am Contact information: [address] [place] [address] [phone]                  Hospital Course by problem list: Orvis Stann is a 83 y.o. person living with a history of Mr. 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 He underwent an exploratory laparotomy with partial gastrectomy and G-tube placement on 6/19. Unfortunately, Mr. Gunby condition has deteriorated over the past couple of weeks, likely had multiple aspiration events, and we discussed with wife what aggressive treatment would look like. Patient stated to his wife that he would prefer to be comfortable in his final days, and they are focused on quality not quantity. Following discussions with his wife, he has been transitioned to comfort care, and will be discharged to a hospice facility today.       Discharge Exam:   BP 112/78 (BP Location: Right Arm)   Pulse 92   Temp 98.4 F (36.9 C) (Axillary)   Resp (!) 22   Ht (P) 5' 10 (1.778 m)   Wt 49.7 kg Comment: bed weight  SpO2 90%   BMI (P) 15.72 kg/m  Discharge exam:   Constitutional:Chronically ill-appearing man, cachectic.  Cardio:Regular rate and rhythm.  Pulm: Normal work of breathing on room  air. MSK: G-tube site, dressing intact, no surrounding erythema noted. Skin:Warm and dry. Neuro:Alert and oriented x3.  Pertinent Labs, Studies, and Procedures:     Latest Ref Rng & Units 04/24/2024    9:24 AM 04/23/2024    6:07 AM 04/21/2024   12:07 PM  CBC  WBC 4.0 - 10.5 K/uL 20.2  20.1  8.1   Hemoglobin 13.0 - 17.0 g/dL 9.9  88.6  88.7   Hematocrit 39.0 - 52.0 % 31.3  35.6  35.6   Platelets 150 - 400 K/uL 232  267  245        Latest Ref Rng & Units 04/24/2024    9:24 AM 04/23/2024    6:07 AM 04/21/2024   12:07 PM  CMP  Glucose 70 - 99 mg/dL 849  884  83   BUN 8 - 23 mg/dL 61  38  32   Creatinine 0.61 - 1.24 mg/dL 8.91  9.05  9.03   Sodium 135 - 145 mmol/L 142  141  135   Potassium 3.5 - 5.1 mmol/L 4.5  4.4  4.5   Chloride 98 - 111 mmol/L 105  101  96   CO2 22 - 32 mmol/L 27  26  24    Calcium  8.9 - 10.3 mg/dL 9.4  89.8  89.7   Total Protein 6.5 - 8.1 g/dL   7.9   Total Bilirubin 0.0 - 1.2 mg/dL   0.7   Alkaline Phos 38 - 126 U/L   69   AST 15 - 41 U/L   21   ALT 0 - 44 U/L   16     Discharge Instructions: Discharge Instructions     Diet - low sodium heart healthy   Complete by: As directed    Increase activity slowly   Complete by: As directed    No wound care   Complete by: As directed        Signed: Shiela Bruns, MD 04/26/2024, 1:27 PM

## 2024-04-26 NOTE — TOC Progression Note (Addendum)
 Transition of Care (TOC) - Progression Note   Followed up with Cheri with [facility] regarding referral. They are still reviewing referral and will notify NCM when determination made   [facility] offered a bed, wife and patient accepted.   Nurse has number to call report. Nurse ready for PTAR to be called   PTAR paperwork and DNR form on chart. PTAR called.  Patient Details  Name: Brian Dunn MRN: [id number] Date of Birth: 1941/01/19  Transition of Care [facility]) CM/SW Contact  Chibuike Fleek, Powell Jansky, RN Phone Number: 04/26/2024, 9:29 AM  Clinical Narrative:       Expected Discharge Plan:  (Awaiting PT/OT evaluations) Barriers to Discharge: Continued Medical Work up  Expected Discharge Plan and Services   Discharge Planning Services: CM Consult   Living arrangements for the past 2 months: Single Family Home                 DME Arranged:  (Await PT/OT recommendations)         HH Arranged:  (Await PT/OT recommendations)           Social Determinants of Health (SDOH) Interventions SDOH Screenings   Food Insecurity: No Food Insecurity (04/22/2024)  Housing: Unknown (04/22/2024)  Transportation Needs: No Transportation Needs (04/22/2024)  Utilities: Not At Risk (04/22/2024)  Depression (PHQ2-9): Low Risk  (04/14/2023)  Social Connections: Unknown (04/22/2024)  Stress: No Stress Concern Present (04/14/2023)  Tobacco Use: Medium Risk (04/22/2024)    Readmission Risk Interventions    03/24/2023    3:23 PM  Readmission Risk Prevention Plan  Post Dischage Appt Complete  Medication Screening Complete  Transportation Screening Complete

## 2024-04-26 NOTE — Care Management Important Message (Signed)
 Important Message  Patient Details  Name: Brian Dunn MRN: [id number] Date of Birth: [birth year]   Important Message Given:  No     [name] 04/26/2024, 1:17 PM

## 2024-04-26 NOTE — Progress Notes (Signed)
 6/23 Pt under Comfort Care, IMM Letter respectfully not given.

## 2024-05-04 DEATH — deceased

## 2024-05-12 ENCOUNTER — Other Ambulatory Visit

## 2024-05-12 ENCOUNTER — Ambulatory Visit: Admitting: Oncology

## 2024-06-18 ENCOUNTER — Other Ambulatory Visit (HOSPITAL_COMMUNITY): Payer: Medicare HMO
